# Patient Record
Sex: Male | Born: 1990 | Race: Black or African American | Hispanic: No | Marital: Single | Smoking: Never smoker
Health system: Southern US, Community
[De-identification: ages and names within clinical notes are randomized; demographics above are authoritative.]

## PROBLEM LIST (undated history)

## (undated) DIAGNOSIS — M419 Scoliosis, unspecified: Secondary | ICD-10-CM

## (undated) DIAGNOSIS — D5700 Hb-SS disease with crisis, unspecified: Secondary | ICD-10-CM

## (undated) HISTORY — PX: HIP PINNING: SHX1757

## (undated) HISTORY — PX: BACK SURGERY: SHX140

---
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
## 2009-11-29 ENCOUNTER — Emergency Department (HOSPITAL_COMMUNITY): Admission: EM | Admit: 2009-11-29 | Discharge: 2009-11-29 | Payer: Self-pay | Admitting: Emergency Medicine

## 2009-11-29 ENCOUNTER — Encounter (INDEPENDENT_AMBULATORY_CARE_PROVIDER_SITE_OTHER): Payer: Self-pay | Admitting: Emergency Medicine

## 2009-11-29 ENCOUNTER — Ambulatory Visit: Payer: Self-pay | Admitting: Surgery

## 2010-08-23 LAB — POCT I-STAT, CHEM 8
BUN: 8 mg/dL (ref 6–23)
Chloride: 104 mEq/L (ref 96–112)
Creatinine, Ser: 1 mg/dL (ref 0.4–1.5)
Hemoglobin: 11.2 g/dL — ABNORMAL LOW (ref 13.0–17.0)
Potassium: 4 mEq/L (ref 3.5–5.1)
Sodium: 139 mEq/L (ref 135–145)

## 2010-08-23 LAB — CBC
HCT: 31.2 % — ABNORMAL LOW (ref 39.0–52.0)
Hemoglobin: 10.5 g/dL — ABNORMAL LOW (ref 13.0–17.0)
MCH: 36.9 pg — ABNORMAL HIGH (ref 26.0–34.0)
MCHC: 33.6 g/dL (ref 30.0–36.0)
MCV: 109.8 fL — ABNORMAL HIGH (ref 78.0–100.0)
RBC: 2.84 MIL/uL — ABNORMAL LOW (ref 4.22–5.81)

## 2010-08-23 LAB — DIFFERENTIAL
Basophils Relative: 1 % (ref 0–1)
Eosinophils Relative: 1 % (ref 0–5)
Lymphs Abs: 2.8 10*3/uL (ref 0.7–4.0)
Monocytes Relative: 18 % — ABNORMAL HIGH (ref 3–12)
Neutro Abs: 8.1 10*3/uL — ABNORMAL HIGH (ref 1.7–7.7)

## 2010-08-23 LAB — RETICULOCYTES: Retic Ct Pct: 7.4 % — ABNORMAL HIGH (ref 0.4–3.1)

## 2010-10-27 ENCOUNTER — Inpatient Hospital Stay (HOSPITAL_COMMUNITY)
Admission: EM | Admit: 2010-10-27 | Discharge: 2010-11-03 | DRG: 574 | Disposition: A | Discharge status: Home / Self Care | Payer: Federal, State, Local not specified - PPO | Attending: Internal Medicine | Admitting: Internal Medicine

## 2010-10-27 ENCOUNTER — Emergency Department (HOSPITAL_COMMUNITY): Payer: Federal, State, Local not specified - PPO

## 2010-10-27 ENCOUNTER — Encounter (HOSPITAL_COMMUNITY): Payer: Self-pay

## 2010-10-27 DIAGNOSIS — R011 Cardiac murmur, unspecified: Secondary | ICD-10-CM | POA: Diagnosis present

## 2010-10-27 DIAGNOSIS — R040 Epistaxis: Secondary | ICD-10-CM | POA: Diagnosis not present

## 2010-10-27 DIAGNOSIS — R7402 Elevation of levels of lactic acid dehydrogenase (LDH): Secondary | ICD-10-CM | POA: Diagnosis present

## 2010-10-27 DIAGNOSIS — R7401 Elevation of levels of liver transaminase levels: Secondary | ICD-10-CM | POA: Diagnosis present

## 2010-10-27 DIAGNOSIS — D5700 Hb-SS disease with crisis, unspecified: Principal | ICD-10-CM | POA: Diagnosis present

## 2010-10-27 DIAGNOSIS — R0902 Hypoxemia: Secondary | ICD-10-CM | POA: Diagnosis present

## 2010-10-27 DIAGNOSIS — J189 Pneumonia, unspecified organism: Secondary | ICD-10-CM | POA: Diagnosis not present

## 2010-10-27 DIAGNOSIS — E871 Hypo-osmolality and hyponatremia: Secondary | ICD-10-CM | POA: Diagnosis present

## 2010-10-27 DIAGNOSIS — E876 Hypokalemia: Secondary | ICD-10-CM | POA: Diagnosis not present

## 2010-10-27 HISTORY — DX: Scoliosis, unspecified: M41.9

## 2010-10-27 HISTORY — DX: Hb-SS disease with crisis, unspecified: D57.00

## 2010-10-27 LAB — DIFFERENTIAL
Eosinophils Absolute: 0.1 10*3/uL (ref 0.0–0.7)
Eosinophils Relative: 1 % (ref 0–5)
Lymphocytes Relative: 46 % (ref 12–46)
Myelocytes: 0 %
Neutro Abs: 6.2 10*3/uL (ref 1.7–7.7)
Neutrophils Relative %: 44 % (ref 43–77)
Promyelocytes Absolute: 0 %
nRBC: 0 /100 WBC

## 2010-10-27 LAB — COMPREHENSIVE METABOLIC PANEL
ALT: 10 U/L (ref 0–53)
AST: 30 U/L (ref 0–37)
CO2: 27 mEq/L (ref 19–32)
Chloride: 104 mEq/L (ref 96–112)
GFR calc Af Amer: >60 mL/min (ref >60)
GFR calc non Af Amer: >60 mL/min (ref >60)
Sodium: 140 mEq/L (ref 135–145)
Total Bilirubin: 1.8 mg/dL — ABNORMAL HIGH (ref 0.3–1.2)

## 2010-10-27 LAB — CBC
HCT: 28.1 % — ABNORMAL LOW (ref 39.0–52.0)
Hemoglobin: 9.9 g/dL — ABNORMAL LOW (ref 13.0–17.0)
MCH: 33.8 pg (ref 26.0–34.0)
MCHC: 35.2 g/dL (ref 30.0–36.0)

## 2010-10-27 LAB — URINALYSIS, ROUTINE W REFLEX MICROSCOPIC
Glucose, UA: NEGATIVE mg/dL
Hgb urine dipstick: NEGATIVE
Specific Gravity, Urine: 1.012 (ref 1.005–1.030)

## 2010-10-27 NOTE — H&P (Signed)
Manuel Mcguire, AMBLE NO.:  [id number]  MEDICAL RECORD NO.:  [id number]           PATIENT TYPE:  E  LOCATION:  WLED                         FACILITY:  [facility]  PHYSICIAN:  Talmage Nap, MD  DATE OF BIRTH:  [birth year]  DATE OF ADMISSION:  10/27/2010 DATE OF DISCHARGE:                             HISTORY & PHYSICAL   PRIMARY CARE PHYSICIAN:  Jackie Plum, M.D.  History obtainable from the patient and the patient's parents.  CHIEF COMPLAINT:  Aches and pains of one day's duration.  The patient is a 20 year old African-American male with history of sickle cell disease, has had admissions in the past for bone pain crisis, was said to have been in stable health until late last night when the patient developed pain at the back and in the major joints. Pain was said to be very achy, initially started from the back and then thereafter spread all over the body to involve the major joints, especially of the shoulder, the knee, and the hip.  Pain was said to be excruciating with associated difficulty in ambulation.  There was however no history of fever.  There was no history of chills.  There was no history of rigor.  The patient also denied any history of chest pain or shortness of breath.  He complained about pain in his lower back with difficulty in voiding urine.  He denied any dysuria or hematuria.  After evaluating the patient, it was decided the patient should be admitted for stabilization.  PAST MEDICAL HISTORY: 1. Positive for sickle cell anemia with admissions for bone pain     crisis. 2. Scoliosis.  PAST SURGICAL HISTORY:  Lower back surgery.  PREADMISSION MEDICATIONS: 1. Hydrocodone/APAP 5/500 one to two tablets p.o. q.4 p.r.n. 2. Folic acid 1 mg p.o. daily. 3. Hydroxyurea 500 mg one tablet t.i.d.  ALLERGIES:  No known allergy.  SOCIAL HISTORY:  Negative for alcohol or tobacco use.  He is a Archivist at [employer].  FAMILY HISTORY:   Positive for sickle cell trait and hypertension.  REVIEW OF SYSTEMS:  The patient denies any history of headaches.  No blurry vision.  No nausea or vomiting.  Denies any fever.  No chills. No rigor.  No cough.  Complained about pains and aches in the major joints, especially of the shoulder, hip, and knee and lower back and also complained about difficulty micturating.  Denies any frank abdominal discomfort.  No dysuria or hematuria.  No swelling of the lower extremities.  No intolerance to heat or cold, and no neuropsychiatric disorder.  PHYSICAL EXAMINATION:  GENERAL:  A young man, dehydrated, in painful distress. VITAL SIGNS:  His present vital signs; blood pressure is 125/73, pulse is 83, respiratory rate is 16, temperature is 98.2. HEENT:  Pallor, icteric. NECK:  No jugular venous distention.  No carotid bruit.  No lymphadenopathy. CHEST:  Clear to auscultation. HEART:  Heart sounds S1 and S2 with soft systolic murmur. ABDOMEN:  Soft, nontender.  Liver, spleen tip not palpable.  Bowel sounds are positive. EXTREMITIES:  No pedal edema. NEUROLOGIC EXAM:  Did not show any lateralizing signs. NEUROPSYCHIATRIC:  Evaluation  is unremarkable. MUSCULOSKELETAL SYSTEM:  Tenderness in the lower back and in the major joints, especially of the shoulder and hip joints. SKIN:  Showed decreased turgor.  LABORATORY DATA:  Lipase 24, normal.  Chemistry showed sodium of 140, potassium of 3.9, chloride of 104 with a bicarb of 27, glucose is 85, BUN is 6, creatinine 0.83.  LFTs showed total bilirubin 1.8. Transaminases normal.  Urinalysis unremarkable.  Complete blood count with differential showed WBC of 14.1, hemoglobin of 9.9, hematocrit of 28.1, MCV of 95.9 with a platelet count of 340.  Imaging studies done include CT of the abdomen and pelvis without contrast showed trace free fluid in the pelvis.  There is atrophic spleen with diffusely increased attenuation.  IMPRESSION: 1. Sickle  cell disease and bone pain crisis. 2. Leukocytosis, etiology is unknown. 3. Systolic murmur, questionable valvular heart disease.  PLAN:  Plan is to admit the patient to general medical floor.  The patient will be adequately rehydrated with D5 half-normal saline to go at a rate of 150 cc an hour.  Pain control will be done presently in the ED with Dilaudid 4 mg IV q.4 p.r.n. and subsequently when he goes to the floor, the patient will have PCA pump as per protocol.  Other medications to be given to him will include: 1. Folic acid 1 mg p.o. daily. 2. Hydroxyurea 500 mg p.o. t.i.d. 3. He will be on oxygen via nasal cannula 3 breaths per minute. 4. GI prophylaxis with Protonix 40 mg IV q.24 and Lovenox 40 mg subcu     q.24.  Patient  empirically will be treated for his leukocytosis with Rocephin 1 g IV q.24 and he will have bladder catheterization to monitor strict input and output.  Further labs to be ordered on this patient will include urine culture, blood culture x2 before starting IV antibiotics.  CBC, CMP, and magnesium will be repeated in a.m. and the patient will have a 2-D echo done.Patient wiil be evaluated on a day-to-day basis.     Talmage Nap, MD     CN/MEDQ  D:  10/27/2010  T:  10/27/2010  Job:  [id number]  Electronically Signed by Talmage Nap  on 10/27/2010 12:54:04 PM

## 2010-10-28 ENCOUNTER — Inpatient Hospital Stay (HOSPITAL_COMMUNITY): Payer: Federal, State, Local not specified - PPO

## 2010-10-28 LAB — COMPREHENSIVE METABOLIC PANEL
ALT: 65 U/L — ABNORMAL HIGH (ref 0–53)
AST: 126 U/L — ABNORMAL HIGH (ref 0–37)
Albumin: 3.6 g/dL (ref 3.5–5.2)
Calcium: 8.4 mg/dL (ref 8.4–10.5)
Chloride: 99 mEq/L (ref 96–112)
Creatinine, Ser: 0.69 mg/dL (ref 0.4–1.5)
GFR calc Af Amer: >60 mL/min (ref >60)
Sodium: 132 mEq/L — ABNORMAL LOW (ref 135–145)

## 2010-10-28 LAB — DIFFERENTIAL
Basophils Relative: 1 % (ref 0–1)
Eosinophils Relative: 0 % (ref 0–5)
Lymphs Abs: 2.3 10*3/uL (ref 0.7–4.0)
Monocytes Absolute: 2.1 10*3/uL — ABNORMAL HIGH (ref 0.1–1.0)
Neutrophils Relative %: 76 % (ref 43–77)

## 2010-10-28 LAB — CBC
HCT: 24.6 % — ABNORMAL LOW (ref 39.0–52.0)
MCH: 34 pg (ref 26.0–34.0)
MCHC: 35.8 g/dL (ref 30.0–36.0)
MCV: 95 fL (ref 78.0–100.0)
Platelets: 190 10*3/uL (ref 150–400)
WBC: 18.8 10*3/uL — ABNORMAL HIGH (ref 4.0–10.5)

## 2010-10-28 LAB — ABO/RH: ABO/RH(D): A POS

## 2010-10-28 LAB — URINE CULTURE: Colony Count: NO GROWTH

## 2010-10-29 LAB — CBC
MCH: 33.9 pg (ref 26.0–34.0)
MCV: 91.6 fL (ref 78.0–100.0)
Platelets: 122 10*3/uL — ABNORMAL LOW (ref 150–400)
RDW: 21.1 % — ABNORMAL HIGH (ref 11.5–15.5)

## 2010-10-29 LAB — DIFFERENTIAL
Basophils Relative: 0 % (ref 0–1)
Eosinophils Relative: 0 % (ref 0–5)
Lymphs Abs: 2.7 10*3/uL (ref 0.7–4.0)
Monocytes Absolute: 1.9 10*3/uL — ABNORMAL HIGH (ref 0.1–1.0)

## 2010-10-29 LAB — COMPREHENSIVE METABOLIC PANEL
ALT: 55 U/L — ABNORMAL HIGH (ref 0–53)
AST: 89 U/L — ABNORMAL HIGH (ref 0–37)
CO2: 27 mEq/L (ref 19–32)
Chloride: 101 mEq/L (ref 96–112)
Creatinine, Ser: 0.83 mg/dL (ref 0.4–1.5)
GFR calc Af Amer: >60 mL/min (ref >60)
GFR calc non Af Amer: >60 mL/min (ref >60)
Total Bilirubin: 3.8 mg/dL — ABNORMAL HIGH (ref 0.3–1.2)

## 2010-10-29 LAB — RETICULOCYTES
RBC.: 2.27 MIL/uL — ABNORMAL LOW (ref 4.22–5.81)
Retic Count, Absolute: 395 10*3/uL — ABNORMAL HIGH (ref 19.0–186.0)
Retic Ct Pct: 17.4 % — ABNORMAL HIGH (ref 0.4–3.1)

## 2010-10-30 ENCOUNTER — Inpatient Hospital Stay (HOSPITAL_COMMUNITY): Payer: Federal, State, Local not specified - PPO

## 2010-10-30 LAB — DIFFERENTIAL
Basophils Relative: 0 % (ref 0–1)
Eosinophils Absolute: 0.2 10*3/uL (ref 0.0–0.7)
Lymphocytes Relative: 13 % (ref 12–46)
Monocytes Relative: 10 % (ref 3–12)
Neutro Abs: 13.4 10*3/uL — ABNORMAL HIGH (ref 1.7–7.7)
Neutrophils Relative %: 76 % (ref 43–77)

## 2010-10-30 LAB — BASIC METABOLIC PANEL
CO2: 27 mEq/L (ref 19–32)
Calcium: 7.8 mg/dL — ABNORMAL LOW (ref 8.4–10.5)
Chloride: 103 mEq/L (ref 96–112)
GFR calc Af Amer: >60 mL/min (ref >60)
Glucose, Bld: 108 mg/dL — ABNORMAL HIGH (ref 70–99)
Potassium: 3.3 mEq/L — ABNORMAL LOW (ref 3.5–5.1)
Sodium: 135 mEq/L (ref 135–145)

## 2010-10-30 LAB — CBC
Hemoglobin: 7.8 g/dL — ABNORMAL LOW (ref 13.0–17.0)
Hemoglobin: 7.5 g/dL — ABNORMAL LOW (ref 13.0–17.0)
MCH: 32.9 pg (ref 26.0–34.0)
MCHC: 37.7 g/dL — ABNORMAL HIGH (ref 30.0–36.0)
MCV: 87.3 fL (ref 78.0–100.0)
Platelets: 129 10*3/uL — ABNORMAL LOW (ref 150–400)
RBC: 2.37 MIL/uL — ABNORMAL LOW (ref 4.22–5.81)
RDW: 20.5 % — ABNORMAL HIGH (ref 11.5–15.5)
WBC: 17.7 10*3/uL — ABNORMAL HIGH (ref 4.0–10.5)
WBC: 20.1 10*3/uL — ABNORMAL HIGH (ref 4.0–10.5)

## 2010-10-30 LAB — PREPARE RBC (CROSSMATCH)

## 2010-10-30 LAB — PROTIME-INR: INR: 1.43 (ref 0.00–1.49)

## 2010-10-30 LAB — HEPATITIS PANEL, ACUTE: Hep B C IgM: NEGATIVE

## 2010-10-30 LAB — APTT: aPTT: 40 seconds — ABNORMAL HIGH (ref 24–37)

## 2010-10-31 LAB — COMPREHENSIVE METABOLIC PANEL
ALT: 31 U/L (ref 0–53)
AST: 45 U/L — ABNORMAL HIGH (ref 0–37)
Albumin: 2.7 g/dL — ABNORMAL LOW (ref 3.5–5.2)
Alkaline Phosphatase: 87 U/L (ref 39–117)
CO2: 26 mEq/L (ref 19–32)
Chloride: 102 mEq/L (ref 96–112)
Creatinine, Ser: 0.58 mg/dL (ref 0.4–1.5)
GFR calc Af Amer: >60 mL/min (ref >60)
Potassium: 4 mEq/L (ref 3.5–5.1)
Sodium: 136 mEq/L (ref 135–145)
Total Bilirubin: 2.7 mg/dL — ABNORMAL HIGH (ref 0.3–1.2)

## 2010-10-31 LAB — CBC
Hemoglobin: 9.3 g/dL — ABNORMAL LOW (ref 13.0–17.0)
RBC: 2.9 MIL/uL — ABNORMAL LOW (ref 4.22–5.81)
WBC: 14.3 10*3/uL — ABNORMAL HIGH (ref 4.0–10.5)

## 2010-10-31 LAB — DIFFERENTIAL
Basophils Absolute: 0 10*3/uL (ref 0.0–0.1)
Eosinophils Absolute: 0.1 10*3/uL (ref 0.0–0.7)
Lymphocytes Relative: 22 % (ref 12–46)
Monocytes Absolute: 1.3 10*3/uL — ABNORMAL HIGH (ref 0.1–1.0)
Neutrophils Relative %: 68 % (ref 43–77)

## 2010-11-01 LAB — TYPE AND SCREEN: Unit division: 0

## 2010-11-01 LAB — COMPREHENSIVE METABOLIC PANEL
Albumin: 2.7 g/dL — ABNORMAL LOW (ref 3.5–5.2)
BUN: 7 mg/dL (ref 6–23)
Calcium: 8.5 mg/dL (ref 8.4–10.5)
Chloride: 101 mEq/L (ref 96–112)
Creatinine, Ser: 0.59 mg/dL (ref 0.4–1.5)
GFR calc non Af Amer: >60 mL/min (ref >60)
Total Bilirubin: 2.3 mg/dL — ABNORMAL HIGH (ref 0.3–1.2)

## 2010-11-01 LAB — CBC
Hemoglobin: 8.8 g/dL — ABNORMAL LOW (ref 13.0–17.0)
MCH: 31.9 pg (ref 26.0–34.0)
MCHC: 36.4 g/dL — ABNORMAL HIGH (ref 30.0–36.0)
RDW: 20.4 % — ABNORMAL HIGH (ref 11.5–15.5)

## 2010-11-01 LAB — VANCOMYCIN, TROUGH: Vancomycin Tr: 12.8 ug/mL (ref 10.0–20.0)

## 2010-11-02 ENCOUNTER — Encounter (HOSPITAL_COMMUNITY): Payer: Self-pay | Admitting: Radiology

## 2010-11-02 ENCOUNTER — Inpatient Hospital Stay (HOSPITAL_COMMUNITY): Payer: Federal, State, Local not specified - PPO

## 2010-11-02 LAB — CBC
Hemoglobin: 8 g/dL — ABNORMAL LOW (ref 13.0–17.0)
Hemoglobin: 8.4 g/dL — ABNORMAL LOW (ref 13.0–17.0)
MCH: 31.9 pg (ref 26.0–34.0)
MCH: 31.6 pg (ref 26.0–34.0)
MCV: 89.1 fL (ref 78.0–100.0)
RBC: 2.51 MIL/uL — ABNORMAL LOW (ref 4.22–5.81)
RBC: 2.66 MIL/uL — ABNORMAL LOW (ref 4.22–5.81)

## 2010-11-02 LAB — CULTURE, BLOOD (ROUTINE X 2)
Culture  Setup Time: 201205222338
Culture: NO GROWTH

## 2010-11-02 LAB — COMPREHENSIVE METABOLIC PANEL
AST: 22 U/L (ref 0–37)
BUN: 9 mg/dL (ref 6–23)
CO2: 25 mEq/L (ref 19–32)
Chloride: 98 mEq/L (ref 96–112)
Creatinine, Ser: 0.69 mg/dL (ref 0.4–1.5)
GFR calc non Af Amer: >60 mL/min (ref >60)
Total Bilirubin: 1.4 mg/dL — ABNORMAL HIGH (ref 0.3–1.2)

## 2010-11-02 LAB — BASIC METABOLIC PANEL
CO2: 26 mEq/L (ref 19–32)
Chloride: 104 mEq/L (ref 96–112)
GFR calc Af Amer: >60 mL/min (ref >60)
Sodium: 137 mEq/L (ref 135–145)

## 2010-11-02 MED ORDER — IOHEXOL 300 MG/ML  SOLN
80.0000 mL | Freq: Once | INTRAMUSCULAR | Status: AC | PRN
  Administered 2010-11-02: 80 mL via INTRAVENOUS

## 2010-11-03 LAB — CBC
Hemoglobin: 8.3 g/dL — ABNORMAL LOW (ref 13.0–17.0)
MCHC: 34.9 g/dL (ref 30.0–36.0)
RBC: 2.67 MIL/uL — ABNORMAL LOW (ref 4.22–5.81)

## 2010-11-03 LAB — COMPREHENSIVE METABOLIC PANEL
AST: 21 U/L (ref 0–37)
Albumin: 3 g/dL — ABNORMAL LOW (ref 3.5–5.2)
Alkaline Phosphatase: 77 U/L (ref 39–117)
Chloride: 101 mEq/L (ref 96–112)
GFR calc Af Amer: >60 mL/min (ref >60)
Potassium: 3.9 mEq/L (ref 3.5–5.1)
Total Bilirubin: 1.5 mg/dL — ABNORMAL HIGH (ref 0.3–1.2)

## 2010-11-08 LAB — CULTURE, BLOOD (ROUTINE X 2)
Culture  Setup Time: 201205280955
Culture  Setup Time: 201205280955
Culture: NO GROWTH
Culture: NO GROWTH

## 2010-11-08 NOTE — Discharge Summary (Signed)
Manuel Mcguire, Manuel Mcguire             ACCOUNT NO.:  [id number]  MEDICAL RECORD NO.:  [id number]           PATIENT TYPE:  I  LOCATION:  1321                         FACILITY:  [facility]  PHYSICIAN:  Hartley Barefoot, MD    DATE OF BIRTH:  1991/05/28  DATE OF ADMISSION:  10/27/2010 DATE OF DISCHARGE:  11/03/2010                              DISCHARGE SUMMARY   PRIMARY CARE PHYSICIAN:  Jackie Plum, M.D.   DISCHARGE DIAGNOSES: 1. Sickle cell crisis. 2. Bilateral lower lobe pneumonia. 3. Transaminases likely secondary to sickle cell crisis. 4. Epistaxis, resolved. 5. Hyponatremia secondary to dehydration, resolved.  MEDICATIONS: 1. Avelox 400 mg p.o. daily. 2. Docusate 100 mg p.o. b.i.d. 3. Folic acid 1 tablet p.o. daily. 4. Hydrocodone Tylenol one to two tablets by mouth every 4 hours as     needed. 5. Hydroxyurea 500 mg p.o. 3 times a day.  STUDIES PERFORMED: 1. Chest x-ray showed bilateral lower lobe pneumonia, Oct 28, 2010.     Oct 31, 2010, stable bilateral lower lobe pneumonia. 2. CT chest showed bilateral lower lobe and lingular airspace     consolidation.  No evidence for pulmonary abscess, that was Nov 02, 2010.  BRIEF HISTORY OF PRESENT ILLNESS:  This is a very pleasant 20 year old African-American with past medical history significant for sickle cell. He presents complaining of back pain, joint pain.  His pain is generalized in both joints, especially shoulder, knees and hip.  He subsequently was complaining of cough.  He spiked fever during the hospitalization.  Chest x-ray showed pneumonia.  HOSPITAL COURSE: 1. Sickle cell crisis likely exacerbated by pneumonia.  The patient's     bilirubin was elevated at 3.8.  His retic count was elevated at 17.     He required 2 units of packed red blood cell during     hospitalization.  His hemoglobin has remained stable at 8.     His bilirubin decreased to 1.5.  His pain was controlled with IV Dilaudid.      He was  initially started on PCA pump, then this was transitioned to     IV Dilaudid p.r.n.  His pain is better.  We initially treated him with     IV fluids and blood transfusion.  We will discharge him on his p.o. pain medications.  2. Pneumonia.  The patient had a chest x-ray that showed pneumonia.     His white blood cell on admission was at 20.  He spiked fever over     5 days during hospitalization.  He was started on IV vanco and     Zosyn initially, then azithromycin was added because he continued     to be febrile.  His white blood cell decreased to 10.5 on the day     of discharge.  He completed 7 days of IV antibiotics.  His oxygen     saturation is improved, 100% on room air.  He has now remained     afebrile for 24 hours.  Blood cultures x2 negative.  CT was ordered     because the patient spiked fever to  rule out abscess and it was     negative. 3. Transaminases.  Increased liver function test, they decreased to     normal.  This was likely in the setting of sickle cell crisis. 4. Hypoxemia secondary to pneumonia and sickle cell disease.  The     patient denies chest pain. 5. Systolic murmur likely secondary to anemia.  The patient had a 2-D     echo that showed normal ejection fraction 65%, no valve abnormality     that will explain this murmur.  On the day of discharge, the patient was feeling at baseline, blood pressure 116/75, sats 100% on room air, respirations 20, pulse 79, temperature 98.0.  White blood cell at 10.5, hemoglobin 8.3, platelet 257.  Sodium 135, potassium 3.9, chloride 101, bicarb 24, glucose 86, creatinine 0.63, BUN 1.5, AST 21, AST 18, albumin 3.0.  The patient was discharged in stable condition.     Hartley Barefoot, MD     BR/MEDQ  D:  11/03/2010  T:  11/04/2010  Job:  [id number]  cc:   Jackie Plum, M.D. Fax: [phone]  Electronically Signed by Hartley Barefoot MD on 11/08/2010 09:33:47 PM

## 2012-03-12 ENCOUNTER — Emergency Department (HOSPITAL_COMMUNITY): Payer: No Typology Code available for payment source

## 2012-03-12 ENCOUNTER — Encounter (HOSPITAL_COMMUNITY): Payer: Self-pay | Admitting: *Deleted

## 2012-03-12 ENCOUNTER — Emergency Department (HOSPITAL_COMMUNITY)
Admission: EM | Admit: 2012-03-12 | Discharge: 2012-03-12 | Disposition: A | Discharge status: Home / Self Care | Payer: No Typology Code available for payment source | Attending: Emergency Medicine | Admitting: Emergency Medicine

## 2012-03-12 DIAGNOSIS — M542 Cervicalgia: Secondary | ICD-10-CM | POA: Insufficient documentation

## 2012-03-12 DIAGNOSIS — T07XXXA Unspecified multiple injuries, initial encounter: Secondary | ICD-10-CM | POA: Insufficient documentation

## 2012-03-12 DIAGNOSIS — F411 Generalized anxiety disorder: Secondary | ICD-10-CM | POA: Insufficient documentation

## 2012-03-12 DIAGNOSIS — S0219XA Other fracture of base of skull, initial encounter for closed fracture: Secondary | ICD-10-CM

## 2012-03-12 DIAGNOSIS — S02109A Fracture of base of skull, unspecified side, initial encounter for closed fracture: Secondary | ICD-10-CM | POA: Insufficient documentation

## 2012-03-12 DIAGNOSIS — H748X9 Other specified disorders of middle ear and mastoid, unspecified ear: Secondary | ICD-10-CM | POA: Insufficient documentation

## 2012-03-12 DIAGNOSIS — M25569 Pain in unspecified knee: Secondary | ICD-10-CM | POA: Insufficient documentation

## 2012-03-12 DIAGNOSIS — IMO0002 Reserved for concepts with insufficient information to code with codable children: Secondary | ICD-10-CM

## 2012-03-12 DIAGNOSIS — S0180XA Unspecified open wound of other part of head, initial encounter: Secondary | ICD-10-CM | POA: Insufficient documentation

## 2012-03-12 DIAGNOSIS — D571 Sickle-cell disease without crisis: Secondary | ICD-10-CM | POA: Insufficient documentation

## 2012-03-12 DIAGNOSIS — S02609A Fracture of mandible, unspecified, initial encounter for closed fracture: Secondary | ICD-10-CM | POA: Insufficient documentation

## 2012-03-12 LAB — BASIC METABOLIC PANEL
BUN: 10 mg/dL (ref 6–23)
Calcium: 9.6 mg/dL (ref 8.4–10.5)
Creatinine, Ser: 0.91 mg/dL (ref 0.50–1.35)
GFR calc Af Amer: >90 mL/min (ref >90)
GFR calc non Af Amer: >90 mL/min (ref >90)
Glucose, Bld: 99 mg/dL (ref 70–99)
Potassium: 3.9 mEq/L (ref 3.5–5.1)

## 2012-03-12 LAB — CBC WITH DIFFERENTIAL/PLATELET
Basophils Relative: 1 % (ref 0–1)
Eosinophils Absolute: 0.1 10*3/uL (ref 0.0–0.7)
Eosinophils Relative: 2 % (ref 0–5)
Hemoglobin: 10.3 g/dL — ABNORMAL LOW (ref 13.0–17.0)
MCH: 35.8 pg — ABNORMAL HIGH (ref 26.0–34.0)
MCHC: 36.8 g/dL — ABNORMAL HIGH (ref 30.0–36.0)
MCV: 97.2 fL (ref 78.0–100.0)
Monocytes Relative: 15 % — ABNORMAL HIGH (ref 3–12)
Neutrophils Relative %: 48 % (ref 43–77)

## 2012-03-12 LAB — RAPID URINE DRUG SCREEN, HOSP PERFORMED
Barbiturates: NOT DETECTED
Cocaine: NOT DETECTED
Tetrahydrocannabinol: POSITIVE — AB

## 2012-03-12 MED ORDER — CLINDAMYCIN HCL 150 MG PO CAPS: 300.0000 mg | ORAL_CAPSULE | Freq: Three times a day (TID) | ORAL | Status: DC

## 2012-03-12 MED ORDER — HYDROMORPHONE HCL PF 1 MG/ML IJ SOLN
1.0000 mg | Freq: Once | INTRAMUSCULAR | Status: AC
  Administered 2012-03-12: 1 mg via INTRAVENOUS
  Filled 2012-03-12: qty 1

## 2012-03-12 MED ORDER — OXYCODONE-ACETAMINOPHEN 5-325 MG PO TABS: 2.0000 | ORAL_TABLET | ORAL | Status: DC | PRN

## 2012-03-12 MED ORDER — ONDANSETRON HCL 4 MG/2ML IJ SOLN
4.0000 mg | Freq: Once | INTRAMUSCULAR | Status: AC
  Administered 2012-03-12: 4 mg via INTRAVENOUS
  Filled 2012-03-12: qty 2

## 2012-03-12 MED ORDER — SODIUM CHLORIDE 0.9 % IV BOLUS (SEPSIS)
1000.0000 mL | Freq: Once | INTRAVENOUS | Status: AC
  Administered 2012-03-12: 1000 mL via INTRAVENOUS

## 2012-03-12 MED ORDER — IOHEXOL 300 MG/ML  SOLN
100.0000 mL | Freq: Once | INTRAMUSCULAR | Status: AC | PRN
  Administered 2012-03-12: 100 mL via INTRAVENOUS
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
## 2012-03-12 NOTE — ED Notes (Signed)
Patient was able to get out of bed on him own and ambulate in the hall with no difficulty.  Patient states he has pain in his left knee and the pain is when at rest and walking.

## 2012-03-12 NOTE — ED Notes (Signed)
Pt taken off of head blocks, backboard. Pt not complaining of back pain. Pt complaining of neck pain. c-collar remains.

## 2012-03-12 NOTE — ED Notes (Signed)
Per Kassie(RN) MD waiving creatinine requirement on patient due to acute trauma

## 2012-03-12 NOTE — ED Notes (Signed)
Patient is currently in xray. Patient to be brought to room 29 once xray is complete. Patient will be waiting discharge. Marland Kitchen

## 2012-03-12 NOTE — ED Notes (Signed)
Report to Dennie Bible, RN in [place].  Pt to transfer to C-29 while waiting for knee x-ray.

## 2012-03-12 NOTE — ED Notes (Signed)
First meeting with patient. Patient states he is okay and needs a urinal. Patient given a urinal, apple juice and call light.
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
## 2012-03-12 NOTE — ED Notes (Signed)
Pt in CT.
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
## 2012-04-02 ENCOUNTER — Ambulatory Visit (HOSPITAL_BASED_OUTPATIENT_CLINIC_OR_DEPARTMENT_OTHER): Payer: No Typology Code available for payment source

## 2012-10-23 ENCOUNTER — Encounter: Payer: Self-pay | Admitting: Internal Medicine

## 2012-10-27 ENCOUNTER — Encounter: Payer: Self-pay | Admitting: Internal Medicine

## 2012-10-27 ENCOUNTER — Ambulatory Visit (HOSPITAL_COMMUNITY)
Admission: AD | Admit: 2012-10-27 | Discharge: 2012-10-27 | Disposition: A | Discharge status: Home / Self Care | Payer: Federal, State, Local not specified - PPO | Source: Ambulatory Visit | Attending: Internal Medicine | Admitting: Internal Medicine

## 2012-10-27 ENCOUNTER — Ambulatory Visit (INDEPENDENT_AMBULATORY_CARE_PROVIDER_SITE_OTHER): Payer: Federal, State, Local not specified - PPO | Admitting: Internal Medicine

## 2012-10-27 VITALS — BP 117/62 | HR 61 | Temp 98.1°F | Ht 67.0 in | Wt 151.0 lb

## 2012-10-27 DIAGNOSIS — D571 Sickle-cell disease without crisis: Secondary | ICD-10-CM

## 2012-10-27 DIAGNOSIS — D5700 Hb-SS disease with crisis, unspecified: Secondary | ICD-10-CM | POA: Insufficient documentation

## 2012-10-27 LAB — COMPREHENSIVE METABOLIC PANEL
Albumin: 4 g/dL (ref 3.5–5.2)
Alkaline Phosphatase: 73 U/L (ref 39–117)
BUN: 10 mg/dL (ref 6–23)
Creatinine, Ser: 0.84 mg/dL (ref 0.50–1.35)
GFR calc Af Amer: >90 mL/min (ref >90)
Glucose, Bld: 82 mg/dL (ref 70–99)
Potassium: 4.3 mEq/L (ref 3.5–5.1)
Total Protein: 8.2 g/dL (ref 6.0–8.3)

## 2012-10-27 LAB — CBC WITH DIFFERENTIAL/PLATELET
Basophils Relative: 0 % (ref 0–1)
Eosinophils Absolute: 0.3 10*3/uL (ref 0.0–0.7)
Eosinophils Relative: 3 % (ref 0–5)
HCT: 28 % — ABNORMAL LOW (ref 39.0–52.0)
Hemoglobin: 10.3 g/dL — ABNORMAL LOW (ref 13.0–17.0)
Lymphs Abs: 4 10*3/uL (ref 0.7–4.0)
MCH: 37.6 pg — ABNORMAL HIGH (ref 26.0–34.0)
MCHC: 36.8 g/dL — ABNORMAL HIGH (ref 30.0–36.0)
MCV: 102.2 fL — ABNORMAL HIGH (ref 78.0–100.0)
Monocytes Absolute: 1.6 10*3/uL — ABNORMAL HIGH (ref 0.1–1.0)
Monocytes Relative: 17 % — ABNORMAL HIGH (ref 3–12)
RBC: 2.74 MIL/uL — ABNORMAL LOW (ref 4.22–5.81)

## 2012-10-27 LAB — RETICULOCYTES
Retic Count, Absolute: 323.3 10*3/uL — ABNORMAL HIGH (ref 19.0–186.0)
Retic Ct Pct: 11.8 % — ABNORMAL HIGH (ref 0.4–3.1)

## 2012-10-27 LAB — FERRITIN: Ferritin: 241 ng/mL (ref 22–322)

## 2012-10-27 MED ORDER — HYDROXYUREA 500 MG PO CAPS: 1500.0000 mg | ORAL_CAPSULE | Freq: Every day | ORAL | Status: AC

## 2012-10-27 MED ORDER — OXYCODONE-ACETAMINOPHEN 5-325 MG PO TABS: 1.0000 | ORAL_TABLET | ORAL | Status: DC | PRN

## 2012-10-27 MED ORDER — FOLIC ACID 1 MG PO TABS: 1.0000 mg | ORAL_TABLET | Freq: Every day | ORAL | Status: AC

## 2012-10-27 NOTE — Progress Notes (Signed)
Pt with sickle cell disease here for lab draws. Labs drawn as per order, pt tolerated well

## 2012-10-27 NOTE — Patient Instructions (Signed)
Followup in the clinic in one month.

## 2012-10-27 NOTE — Progress Notes (Signed)
Subjective:     Patient ID: Manuel Mcguire, male   DOB: 10-01-1990, 22 y.o.   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 The patient states that his symptoms he usually well controlled and he goes for months sometimes without having any episodes of pain. He is on hydroxyurea and folic acid and doses that he is compliant with these medications. He states that his last eye examination was approximately 2 months ago and that his last echocardiogram was less than 5 years ago. He is unable to articulate the results of those examinations. The patient denies any shortness of breath, dyspnea on exertion, fever, chills, nausea, vomiting or diarrhea. He states his last hospitalization was greater than one year ago when he is admitted for vaso-occlusive episode. 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 During the summer he works [employer] and feels that his life is well adjusted in the context of his sickle cell disease   Review of Systems  Constitutional: Negative.   HENT: Negative.   Eyes: Negative.   Respiratory: Negative.   Cardiovascular: Negative.   Gastrointestinal: Negative.   Endocrine: Negative.   Genitourinary: Negative.   Musculoskeletal: Negative.   Skin: Negative.   Allergic/Immunologic: Negative.   Neurological: Negative.   Hematological: Negative.   Psychiatric/Behavioral: Negative.        Objective:   Physical Exam  Constitutional: He is oriented to person, place, and time. He appears well-developed and well-nourished.  HENT:  Head: Normocephalic and atraumatic.  Eyes: Conjunctivae and EOM are normal. Pupils are equal, round, and reactive to light. No scleral icterus.  Neck: Normal range of motion. Neck supple. No JVD present. No thyromegaly present.  Cardiovascular: Normal rate, regular rhythm  and normal heart sounds.   Pulmonary/Chest: Effort normal and breath sounds normal. He has no wheezes. He has no rales. He exhibits no tenderness.  Abdominal: Soft. Bowel sounds are normal. He exhibits no distension and no mass. There is no tenderness.  Musculoskeletal: Normal range of motion.  Lymphadenopathy:    He has no cervical adenopathy.  Neurological: He is alert and oriented to person, place, and time. He has normal reflexes. No cranial nerve deficit.  Skin: Skin is warm and dry.  Psychiatric: He has a normal mood and affect. His behavior is normal. Judgment and thought content normal.       Assessment:     This is a patient who reports hemoglobin SS. It appears that he is doing very well in the context of this disease and that the disease had very little impact on disruption of his life.    Plan:     Pt will continue on current dose of hydroxyurea and folic acid. Patient has had no surveillance last was hydroxyurea since October 2013. Looking CBC with differential, comprehensive metabolic and, reticulocyte, ferritin, hemoglobin electrophoresis. Patient is up-to-date on immunizations, ophthalmologic examination and echocardiogram.  And is to return to clinic in one month for followup on labs and medication adjustments as necessary

## 2012-10-27 NOTE — Progress Notes (Signed)
Patient ID: Manuel Mcguire, male   DOB: 06-07-1991, 22 y.o.   MRN: [id number] Labs drawn per order, without difficulty.

## 2012-10-31 ENCOUNTER — Telehealth: Payer: Self-pay | Admitting: Internal Medicine

## 2012-11-24 ENCOUNTER — Ambulatory Visit: Payer: Federal, State, Local not specified - PPO | Admitting: Internal Medicine

## 2013-01-26 NOTE — Telephone Encounter (Signed)
CLOSED

## 2013-01-30 ENCOUNTER — Telehealth: Payer: Self-pay | Admitting: Internal Medicine

## 2013-01-30 DIAGNOSIS — D571 Sickle-cell disease without crisis: Secondary | ICD-10-CM

## 2013-06-22 ENCOUNTER — Telehealth: Payer: Self-pay | Admitting: Internal Medicine

## 2013-06-22 NOTE — Telephone Encounter (Signed)
Left message for patient to call to schedule annual physical.  °
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
## 2023-02-05 ENCOUNTER — Emergency Department (HOSPITAL_COMMUNITY): Payer: Self-pay

## 2023-02-05 ENCOUNTER — Other Ambulatory Visit: Payer: Self-pay

## 2023-02-05 ENCOUNTER — Inpatient Hospital Stay (HOSPITAL_COMMUNITY)
Admission: EM | Admit: 2023-02-05 | Discharge: 2023-02-09 | DRG: 811 | Disposition: A | Discharge status: Home / Self Care | Payer: Self-pay | Attending: Internal Medicine | Admitting: Internal Medicine

## 2023-02-05 DIAGNOSIS — D638 Anemia in other chronic diseases classified elsewhere: Secondary | ICD-10-CM | POA: Diagnosis present

## 2023-02-05 DIAGNOSIS — J9601 Acute respiratory failure with hypoxia: Secondary | ICD-10-CM

## 2023-02-05 DIAGNOSIS — D5700 Hb-SS disease with crisis, unspecified: Principal | ICD-10-CM | POA: Diagnosis present

## 2023-02-05 DIAGNOSIS — Z1152 Encounter for screening for COVID-19: Secondary | ICD-10-CM

## 2023-02-05 DIAGNOSIS — M419 Scoliosis, unspecified: Secondary | ICD-10-CM | POA: Diagnosis present

## 2023-02-05 DIAGNOSIS — Z79899 Other long term (current) drug therapy: Secondary | ICD-10-CM

## 2023-02-05 DIAGNOSIS — Z8249 Family history of ischemic heart disease and other diseases of the circulatory system: Secondary | ICD-10-CM

## 2023-02-05 DIAGNOSIS — Z832 Family history of diseases of the blood and blood-forming organs and certain disorders involving the immune mechanism: Secondary | ICD-10-CM

## 2023-02-05 DIAGNOSIS — J189 Pneumonia, unspecified organism: Secondary | ICD-10-CM

## 2023-02-05 LAB — RETICULOCYTES
Immature Retic Fract: 34.9 % — ABNORMAL HIGH (ref 2.3–15.9)
RBC.: 2.11 MIL/uL — ABNORMAL LOW (ref 4.22–5.81)
Retic Count, Absolute: 216.5 10*3/uL — ABNORMAL HIGH (ref 19.0–186.0)
Retic Ct Pct: 8.3 % — ABNORMAL HIGH (ref 0.4–3.1)

## 2023-02-05 LAB — TROPONIN I (HIGH SENSITIVITY)
Troponin I (High Sensitivity): 8 ng/L (ref <18)
Troponin I (High Sensitivity): 9 ng/L (ref <18)

## 2023-02-05 LAB — CBC WITH DIFFERENTIAL/PLATELET
Abs Immature Granulocytes: 0.08 10*3/uL — ABNORMAL HIGH (ref 0.00–0.07)
Basophils Absolute: 0.1 10*3/uL (ref 0.0–0.1)
Basophils Relative: 0 %
Eosinophils Absolute: 0.4 10*3/uL (ref 0.0–0.5)
Eosinophils Relative: 3 %
HCT: 18.4 % — ABNORMAL LOW (ref 39.0–52.0)
Hemoglobin: 6.9 g/dL — CL (ref 13.0–17.0)
Immature Granulocytes: 1 %
Lymphocytes Relative: 32 %
Lymphs Abs: 5.1 10*3/uL — ABNORMAL HIGH (ref 0.7–4.0)
MCH: 33.3 pg (ref 26.0–34.0)
MCHC: 37.5 g/dL — ABNORMAL HIGH (ref 30.0–36.0)
MCV: 88.9 fL (ref 80.0–100.0)
Monocytes Absolute: 2 10*3/uL — ABNORMAL HIGH (ref 0.1–1.0)
Monocytes Relative: 13 %
Neutro Abs: 8.3 10*3/uL — ABNORMAL HIGH (ref 1.7–7.7)
Neutrophils Relative %: 51 %
Platelets: 310 10*3/uL (ref 150–400)
RBC: 2.07 MIL/uL — ABNORMAL LOW (ref 4.22–5.81)
RDW: 25.4 % — ABNORMAL HIGH (ref 11.5–15.5)
WBC: 15.9 10*3/uL — ABNORMAL HIGH (ref 4.0–10.5)
nRBC: 3.8 % — ABNORMAL HIGH (ref 0.0–0.2)

## 2023-02-05 LAB — COMPREHENSIVE METABOLIC PANEL
ALT: 52 U/L — ABNORMAL HIGH (ref 0–44)
AST: 74 U/L — ABNORMAL HIGH (ref 15–41)
Albumin: 4 g/dL (ref 3.5–5.0)
Alkaline Phosphatase: 110 U/L (ref 38–126)
Anion gap: 6 (ref 5–15)
BUN: 13 mg/dL (ref 6–20)
CO2: 22 mmol/L (ref 22–32)
Calcium: 8.8 mg/dL — ABNORMAL LOW (ref 8.9–10.3)
Chloride: 106 mmol/L (ref 98–111)
Creatinine, Ser: 0.84 mg/dL (ref 0.61–1.24)
GFR, Estimated: >60 mL/min (ref >60)
Glucose, Bld: 87 mg/dL (ref 70–99)
Potassium: 4 mmol/L (ref 3.5–5.1)
Sodium: 134 mmol/L — ABNORMAL LOW (ref 135–145)
Total Bilirubin: 2.3 mg/dL — ABNORMAL HIGH (ref 0.3–1.2)
Total Protein: 8.3 g/dL — ABNORMAL HIGH (ref 6.5–8.1)

## 2023-02-05 LAB — RESP PANEL BY RT-PCR (RSV, FLU A&B, COVID)  RVPGX2
Influenza A by PCR: NEGATIVE
Influenza B by PCR: NEGATIVE
Resp Syncytial Virus by PCR: NEGATIVE
SARS Coronavirus 2 by RT PCR: NEGATIVE

## 2023-02-05 LAB — PROCALCITONIN: Procalcitonin: 0.15 ng/mL

## 2023-02-05 MED ORDER — DIPHENHYDRAMINE HCL 25 MG PO CAPS
25.0000 mg | ORAL_CAPSULE | ORAL | Status: DC | PRN
  Administered 2023-02-06: 25 mg via ORAL
  Filled 2023-02-05: qty 1

## 2023-02-05 MED ORDER — HYDROMORPHONE HCL 2 MG/ML IJ SOLN
2.0000 mg | Freq: Once | INTRAMUSCULAR | Status: AC
  Administered 2023-02-05: 2 mg via INTRAVENOUS
  Filled 2023-02-05: qty 1

## 2023-02-05 MED ORDER — SODIUM CHLORIDE 0.9 % IV SOLN
2.0000 g | INTRAVENOUS | Status: DC
  Administered 2023-02-06: 2 g via INTRAVENOUS
  Filled 2023-02-05: qty 20

## 2023-02-05 MED ORDER — HYDROMORPHONE HCL 1 MG/ML IJ SOLN
1.0000 mg | Freq: Once | INTRAMUSCULAR | Status: AC
  Administered 2023-02-05: 1 mg via INTRAVENOUS
  Filled 2023-02-05: qty 1

## 2023-02-05 MED ORDER — KETOROLAC TROMETHAMINE 15 MG/ML IJ SOLN
15.0000 mg | Freq: Four times a day (QID) | INTRAMUSCULAR | Status: DC
  Administered 2023-02-06 – 2023-02-09 (×14): 15 mg via INTRAVENOUS
  Filled 2023-02-05 (×14): qty 1

## 2023-02-05 MED ORDER — SODIUM CHLORIDE 0.9 % IV SOLN
1.0000 g | Freq: Once | INTRAVENOUS | Status: AC
  Administered 2023-02-05: 1 g via INTRAVENOUS
  Filled 2023-02-05: qty 10

## 2023-02-05 MED ORDER — POLYETHYLENE GLYCOL 3350 17 G PO PACK: 17.0000 g | PACK | Freq: Every day | ORAL | Status: DC | PRN

## 2023-02-05 MED ORDER — SODIUM CHLORIDE 0.45 % IV SOLN: INTRAVENOUS | Status: DC

## 2023-02-05 MED ORDER — SODIUM CHLORIDE 0.9% FLUSH: 9.0000 mL | INTRAVENOUS | Status: DC | PRN

## 2023-02-05 MED ORDER — HYDROMORPHONE HCL 2 MG/ML IJ SOLN
2.0000 mg | INTRAMUSCULAR | Status: AC
  Administered 2023-02-05: 2 mg via INTRAVENOUS
  Filled 2023-02-05: qty 1

## 2023-02-05 MED ORDER — ONDANSETRON HCL 4 MG/2ML IJ SOLN: 4.0000 mg | INTRAMUSCULAR | Status: DC | PRN

## 2023-02-05 MED ORDER — SODIUM CHLORIDE 0.9 % IV SOLN
500.0000 mg | INTRAVENOUS | Status: DC
  Administered 2023-02-05 – 2023-02-06 (×2): 500 mg via INTRAVENOUS
  Filled 2023-02-05 (×2): qty 5

## 2023-02-05 MED ORDER — FOLIC ACID 1 MG PO TABS
1.0000 mg | ORAL_TABLET | Freq: Every day | ORAL | Status: DC
  Administered 2023-02-05 – 2023-02-09 (×5): 1 mg via ORAL
  Filled 2023-02-05 (×5): qty 1

## 2023-02-05 MED ORDER — SODIUM CHLORIDE 0.9 % IV SOLN: 500.0000 mg | Freq: Once | INTRAVENOUS | Status: DC

## 2023-02-05 MED ORDER — NALOXONE HCL 0.4 MG/ML IJ SOLN: 0.4000 mg | INTRAMUSCULAR | Status: DC | PRN

## 2023-02-05 MED ORDER — ENOXAPARIN SODIUM 40 MG/0.4ML IJ SOSY
40.0000 mg | PREFILLED_SYRINGE | INTRAMUSCULAR | Status: DC
  Administered 2023-02-06 – 2023-02-09 (×4): 40 mg via SUBCUTANEOUS
  Filled 2023-02-05 (×4): qty 0.4

## 2023-02-05 MED ORDER — HYDROXYUREA 500 MG PO CAPS
1500.0000 mg | ORAL_CAPSULE | Freq: Every day | ORAL | Status: DC
  Administered 2023-02-05 – 2023-02-08 (×4): 1500 mg via ORAL
  Filled 2023-02-05 (×4): qty 3

## 2023-02-05 MED ORDER — DEXTROSE-SODIUM CHLORIDE 5-0.45 % IV SOLN: INTRAVENOUS | Status: DC

## 2023-02-05 MED ORDER — HYDROMORPHONE HCL 1 MG/ML IJ SOLN: 1.0000 mg | Freq: Once | INTRAMUSCULAR | Status: DC

## 2023-02-05 MED ORDER — ONDANSETRON HCL 4 MG/2ML IJ SOLN: 4.0000 mg | Freq: Four times a day (QID) | INTRAMUSCULAR | Status: DC | PRN

## 2023-02-05 MED ORDER — KETOROLAC TROMETHAMINE 15 MG/ML IJ SOLN
15.0000 mg | INTRAMUSCULAR | Status: AC
  Administered 2023-02-05: 15 mg via INTRAVENOUS
  Filled 2023-02-05: qty 1

## 2023-02-05 MED ORDER — HYDROMORPHONE 1 MG/ML IV SOLN
INTRAVENOUS | Status: DC
  Administered 2023-02-06: 1 mg via INTRAVENOUS
  Administered 2023-02-06: 30 mg via INTRAVENOUS
  Administered 2023-02-06: 3 mg via INTRAVENOUS
  Administered 2023-02-06: 0.5 mg via INTRAVENOUS
  Administered 2023-02-06: 2 mg via INTRAVENOUS
  Administered 2023-02-07: 30 mg via INTRAVENOUS
  Administered 2023-02-07: 9.5 mg via INTRAVENOUS
  Filled 2023-02-05 (×2): qty 30

## 2023-02-05 MED ORDER — SENNOSIDES-DOCUSATE SODIUM 8.6-50 MG PO TABS
1.0000 | ORAL_TABLET | Freq: Two times a day (BID) | ORAL | Status: DC
  Administered 2023-02-06 – 2023-02-09 (×8): 1 via ORAL
  Filled 2023-02-05 (×8): qty 1

## 2023-02-05 MED ORDER — ONDANSETRON HCL 4 MG PO TABS: 4.0000 mg | ORAL_TABLET | ORAL | Status: DC | PRN

## 2023-02-05 MED ORDER — IOHEXOL 350 MG/ML SOLN
75.0000 mL | Freq: Once | INTRAVENOUS | Status: AC | PRN
  Administered 2023-02-05: 75 mL via INTRAVENOUS

## 2023-02-05 MED ORDER — HYDROXYZINE HCL 25 MG PO TABS
25.0000 mg | ORAL_TABLET | ORAL | Status: DC | PRN
  Administered 2023-02-06 – 2023-02-08 (×2): 25 mg via ORAL
  Administered 2023-02-09: 50 mg via ORAL
  Filled 2023-02-05 (×2): qty 1
  Filled 2023-02-05: qty 2

## 2023-02-05 NOTE — Assessment & Plan Note (Addendum)
-   will admit per sickle cell protocol,    control pain,    hydrate with IVF D5 .45% Saline @ 100 mls/hour,    Weight based Dilaudid PCA for opioid tolerant patients.    continue hydroxyurea and folic acid   Transfuse as needed if Hg drops significantly below baseline. He reports have had blood transfusion in the past back in May for HG of 6.9 but states he gets itchy and has chills with blood transfusion  Will obtain type and screen Monitor Hg may need 1 UNIt if HG cont to drop defer to sickle cell team in am     Sickle cell team to take over management in AM

## 2023-02-05 NOTE — ED Notes (Signed)
Went to administer Rocephin. Noted that blood cultures or lactic acid had not been drawn on prior shift and rocephin was running via gravity and had not infused but half the bag. Placed on pump to finish. Patient given urinal to void to collect urine specimen.

## 2023-02-05 NOTE — ED Notes (Signed)
ED TO INPATIENT HANDOFF REPORT  Name/Age/Gender Manuel Mcguire 32 y.o. male  Code Status    Code Status Orders  (From admission, onward)           Start     Ordered   02/05/23 2052  Full code  Continuous       Question:  By:  Answer:  Consent: discussion documented in EHR   02/05/23 2053           Code Status History     This patient has a current code status but no historical code status.       Home/SNF/Other Home  Chief Complaint Sickle cell crisis (HCC) [D57.00]  Level of Care/Admitting Diagnosis ED Disposition     ED Disposition  Admit   Condition  --   Comment  Hospital Area: [facility] [[id number]]  Level of Care: Telemetry [5]  Admit to tele based on following criteria: Other see comments  Comments: acute chest  May admit patient to [facility] or [facility] if equivalent level of care is available:: No  Covid Evaluation: Asymptomatic - no recent exposure (last 10 days) testing not required  Diagnosis: Sickle cell crisis [facility]) [[id number]]  Admitting Physician: Therisa Doyne [[id number]]  Attending Physician: Therisa Doyne [[id number]]  Certification:: I certify this patient will need inpatient services for at least 2 midnights  Expected Medical Readiness: 02/08/2023          Medical History Past Medical History:  Diagnosis Date   Scoliosis    Sickle cell anemia with pain (HCC)     Allergies No Known Allergies  IV Location/Drains/Wounds Patient Lines/Drains/Airways Status     Active Line/Drains/Airways     Name Placement date Placement time Site Days   Peripheral IV 02/05/23 20 G 1.88" Anterior;Left;Proximal Forearm 02/05/23  1543  Forearm  less than 1   Peripheral IV 02/05/23 20 G 1" Anterior;Right Forearm 02/05/23  2245  Forearm  less than 1            Labs/Imaging Results for orders placed or performed during the hospital encounter of 02/05/23 (from the past 48 hour(s))  Comprehensive metabolic panel      Status: Abnormal   Collection Time: 02/05/23  1:35 PM  Result Value Ref Range   Sodium 134 (L) 135 - 145 mmol/L   Potassium 4.0 3.5 - 5.1 mmol/L   Chloride 106 98 - 111 mmol/L   CO2 22 22 - 32 mmol/L   Glucose, Bld 87 70 - 99 mg/dL    Comment: Glucose reference range applies only to samples taken after fasting for at least 8 hours.   BUN 13 6 - 20 mg/dL   Creatinine, Ser 6.21 0.61 - 1.24 mg/dL   Calcium 8.8 (L) 8.9 - 10.3 mg/dL   Total Protein 8.3 (H) 6.5 - 8.1 g/dL   Albumin 4.0 3.5 - 5.0 g/dL   AST 74 (H) 15 - 41 U/L   ALT 52 (H) 0 - 44 U/L   Alkaline Phosphatase 110 38 - 126 U/L   Total Bilirubin 2.3 (H) 0.3 - 1.2 mg/dL   GFR, Estimated >30 >86 mL/min    Comment: (NOTE) Calculated using the CKD-EPI Creatinine Equation (2021)    Anion gap 6 5 - 15    Comment: Performed at [facility], [address] [place], [place] [address]  CBC with Differential     Status: Abnormal   Collection Time: 02/05/23  1:35 PM  Result Value Ref  Range   WBC 15.9 (H) 4.0 - 10.5 K/uL    Comment: WHITE COUNT CONFIRMED ON SMEAR   RBC 2.07 (L) 4.22 - 5.81 MIL/uL   Hemoglobin 6.9 (LL) 13.0 - 17.0 g/dL    Comment: REPEATED TO VERIFY THIS CRITICAL RESULT HAS VERIFIED AND BEEN CALLED TO RN C KISER BY ALEXIS CRUICKSHANK ON 08 31 2024 AT 1631, AND HAS BEEN READ BACK.     HCT 18.4 (L) 39.0 - 52.0 %   MCV 88.9 80.0 - 100.0 fL   MCH 33.3 26.0 - 34.0 pg   MCHC 37.5 (H) 30.0 - 36.0 g/dL   RDW 60.4 (H) 54.0 - 98.1 %   Platelets 310 150 - 400 K/uL   nRBC 3.8 (H) 0.0 - 0.2 %   Neutrophils Relative % 51 %   Neutro Abs 8.3 (H) 1.7 - 7.7 K/uL   Lymphocytes Relative 32 %   Lymphs Abs 5.1 (H) 0.7 - 4.0 K/uL   Monocytes Relative 13 %   Monocytes Absolute 2.0 (H) 0.1 - 1.0 K/uL   Eosinophils Relative 3 %   Eosinophils Absolute 0.4 0.0 - 0.5 K/uL   Basophils Relative 0 %   Basophils Absolute 0.1 0.0 - 0.1 K/uL   Immature Granulocytes 1 %   Abs Immature Granulocytes 0.08 (H) 0.00 - 0.07 K/uL    Polychromasia PRESENT    Sickle Cells PRESENT    Target Cells PRESENT     Comment: Performed at [facility], [address], [place], [place] [address]  Reticulocytes     Status: Abnormal   Collection Time: 02/05/23  1:35 PM  Result Value Ref Range   Retic Ct Pct 8.3 (H) 0.4 - 3.1 %    Comment: CONFIRMED BY MANUAL DILUTION   RBC. 2.11 (L) 4.22 - 5.81 MIL/uL   Retic Count, Absolute 216.5 (H) 19.0 - 186.0 K/uL   Immature Retic Fract 34.9 (H) 2.3 - 15.9 %    Comment: Performed at [facility], [address], [place], [place] [address]  Troponin I (High Sensitivity)     Status: None   Collection Time: 02/05/23  1:35 PM  Result Value Ref Range   Troponin I (High Sensitivity) 8 <18 ng/L    Comment: (NOTE) Elevated high sensitivity troponin I (hsTnI) values and significant  changes across serial measurements may suggest ACS but many other  chronic and acute conditions are known to elevate hsTnI results.  Refer to the "Links" section for chest pain algorithms and additional  guidance. Performed at [facility], [address], [place], [place] [address]   Resp panel by RT-PCR (RSV, Flu A&B, Covid) Anterior Nasal Swab     Status: None   Collection Time: 02/05/23  4:09 PM   Specimen: Anterior Nasal Swab  Result Value Ref Range   SARS Coronavirus 2 by RT PCR NEGATIVE NEGATIVE    Comment: (NOTE) SARS-CoV-2 target nucleic acids are NOT DETECTED.  The SARS-CoV-2 RNA is generally detectable in upper respiratory specimens during the acute phase of infection. The lowest concentration of SARS-CoV-2 viral copies this assay can detect is 138 copies/mL. A negative result does not preclude SARS-Cov-2 infection and should not be used as the sole basis for treatment or other patient management decisions. A negative result may occur with  improper specimen collection/handling, submission of specimen other than nasopharyngeal swab, presence  of viral mutation(s) within the areas targeted by this assay, and inadequate number of viral copies(<138 copies/mL). A negative  result must be combined with clinical observations, patient history, and epidemiological information. The expected result is Negative.  Fact Sheet for Patients:  BloggerCourse.com  Fact Sheet for Healthcare Providers:  SeriousBroker.it  This test is no t yet approved or cleared by the [place] FDA and  has been authorized for detection and/or diagnosis of SARS-CoV-2 by FDA under an Emergency Use Authorization (EUA). This EUA will remain  in effect (meaning this test can be used) for the duration of the COVID-19 declaration under Section 564(b)(1) of the Act, 21 U.S.C.section 360bbb-3(b)(1), unless the authorization is terminated  or revoked sooner.       Influenza A by PCR NEGATIVE NEGATIVE   Influenza B by PCR NEGATIVE NEGATIVE    Comment: (NOTE) The Xpert Xpress SARS-CoV-2/FLU/RSV plus assay is intended as an aid in the diagnosis of influenza from Nasopharyngeal swab specimens and should not be used as a sole basis for treatment. Nasal washings and aspirates are unacceptable for Xpert Xpress SARS-CoV-2/FLU/RSV testing.  Fact Sheet for Patients: BloggerCourse.com  Fact Sheet for Healthcare Providers: SeriousBroker.it  This test is not yet approved or cleared by the [place] FDA and has been authorized for detection and/or diagnosis of SARS-CoV-2 by FDA under an Emergency Use Authorization (EUA). This EUA will remain in effect (meaning this test can be used) for the duration of the COVID-19 declaration under Section 564(b)(1) of the Act, 21 U.S.C. section 360bbb-3(b)(1), unless the authorization is terminated or revoked.     Resp Syncytial Virus by PCR NEGATIVE NEGATIVE    Comment: (NOTE) Fact Sheet for  Patients: BloggerCourse.com  Fact Sheet for Healthcare Providers: SeriousBroker.it  This test is not yet approved or cleared by the [place] FDA and has been authorized for detection and/or diagnosis of SARS-CoV-2 by FDA under an Emergency Use Authorization (EUA). This EUA will remain in effect (meaning this test can be used) for the duration of the COVID-19 declaration under Section 564(b)(1) of the Act, 21 U.S.C. section 360bbb-3(b)(1), unless the authorization is terminated or revoked.  Performed at [facility], [address], [place], [place] [address]   Troponin I (High Sensitivity)     Status: None   Collection Time: 02/05/23  5:32 PM  Result Value Ref Range   Troponin I (High Sensitivity) 9 <18 ng/L    Comment: (NOTE) Elevated high sensitivity troponin I (hsTnI) values and significant  changes across serial measurements may suggest ACS but many other  chronic and acute conditions are known to elevate hsTnI results.  Refer to the "Links" section for chest pain algorithms and additional  guidance. Performed at [facility], [address], [place], [place] [address]   Procalcitonin     Status: None   Collection Time: 02/05/23  5:32 PM  Result Value Ref Range   Procalcitonin 0.15 ng/mL    Comment:        Interpretation: PCT (Procalcitonin) <= 0.5 ng/mL: Systemic infection (sepsis) is not likely. Local bacterial infection is possible. (NOTE)       Sepsis PCT Algorithm           Lower Respiratory Tract                                      Infection PCT Algorithm    ----------------------------     ----------------------------         PCT < 0.25 ng/mL  PCT < 0.10 ng/mL          Strongly encourage             Strongly discourage   discontinuation of antibiotics    initiation of antibiotics    ----------------------------     -----------------------------        PCT 0.25 - 0.50 ng/mL            PCT 0.10 - 0.25 ng/mL               OR       >80% decrease in PCT            Discourage initiation of                                            antibiotics      Encourage discontinuation           of antibiotics    ----------------------------     -----------------------------         PCT >= 0.50 ng/mL              PCT 0.26 - 0.50 ng/mL               AND        <80% decrease in PCT             Encourage initiation of                                             antibiotics       Encourage continuation           of antibiotics    ----------------------------     -----------------------------        PCT >= 0.50 ng/mL                  PCT > 0.50 ng/mL               AND         increase in PCT                  Strongly encourage                                      initiation of antibiotics    Strongly encourage escalation           of antibiotics                                     -----------------------------                                           PCT <= 0.25 ng/mL                                                 OR                                        >  80% decrease in PCT                                      Discontinue / Do not initiate                                             antibiotics  Performed at [facility], [address], [place], [place] [address]   Type and screen [facility]     Status: None   Collection Time: 02/05/23  5:40 PM  Result Value Ref Range   ABO/RH(D) A POS    Antibody Screen NEG    Sample Expiration      02/08/2023,2359 Performed at [facility], [address], [place], [place] [address]    CT Angio Chest PE W and/or Wo Contrast  Result Date: 02/05/2023 CLINICAL DATA:  Acute chest pain. Sickle cell pain. Concern for pulmonary embolism. Concern for pneumonia. EXAM: CT ANGIOGRAPHY CHEST WITH CONTRAST TECHNIQUE: Multidetector CT imaging of the chest was  performed using the standard protocol during bolus administration of intravenous contrast. Multiplanar CT image reconstructions and MIPs were obtained to evaluate the vascular anatomy. RADIATION DOSE REDUCTION: This exam was performed according to the departmental dose-optimization program which includes automated exposure control, adjustment of the mA and/or kV according to patient size and/or use of iterative reconstruction technique. CONTRAST:  75mL OMNIPAQUE IOHEXOL 350 MG/ML SOLN COMPARISON:  11/02/2010 FINDINGS: Cardiovascular: No filling defects within the pulmonary arteries to suggest acute pulmonary embolism. Streak artifact from the posterior thoracic fusion degrades imaging. Mediastinum/Nodes: No axillary or supraclavicular adenopathy. No mediastinal or hilar adenopathy. No pericardial fluid. Esophagus normal. Lungs/Pleura: There is diffuse ground-glass densities in the upper and lower lobes. No pulmonary infarction. No pneumonia. 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  No acute findings Musculoskeletal: Posterior thoracic fusion. Scoliosis no acute findings Review of the MIP images confirms the above findings. IMPRESSION: 1. No evidence acute pulmonary embolism. 2. Mild diffuse ground-glass densities in the lungs consistent with edema or atelectasis. 3. No pulmonary infarction or pneumonia. Electronically Signed   By: Genevive Bi M.D.   On: 02/05/2023 18:14   DG Chest 2 View  Result Date: 02/05/2023 CLINICAL DATA:  Shortness of breath. EXAM: CHEST - 2 VIEW COMPARISON:  Chest x-ray March 12, 2012. FINDINGS: Mild patchy opacities in both lungs. No visible pleural effusions or pneumothorax. Enlarged cardiac silhouette. Spinal fusion. IMPRESSION: 1. Mild patchy opacities in both lungs, which could represent mild edema or pneumonia. 2. Cardiomegaly. Electronically Signed   By: Feliberto Harts M.D.   On: 02/05/2023 16:10    Pending Labs Unresulted Labs (From admission,  onward)     Start     Ordered   02/05/23 2121  Urinalysis, Complete w Microscopic -Urine, Clean Catch  Once,   R       Question Answer Comment  Release to patient Immediate   Specimen Source Urine, Clean Catch      02/05/23 2120   02/05/23 2121  Urine Culture (for pregnant, neutropenic or urologic patients or patients with an indwelling urinary catheter)  (Urine Labs)  Once,   R       Question:  Indication  Answer:  Dysuria   02/05/23 2120   02/05/23 2042  HIV Antibody (routine testing w rflx)  (HIV Antibody (Routine testing w reflex) panel)  Once,   R        02/05/23 2042   02/05/23 2042  Expectorated Sputum Assessment w Gram Stain, Rflx to Resp Cult  Once,   R        02/05/23 2042   02/05/23 2042  Legionella Pneumophila Serogp 1 Ur Ag  Once,   R        02/05/23 2042   02/05/23 2042  Strep pneumoniae urinary antigen  Once,   R        02/05/23 2042   02/05/23 2041  CK  Add-on,   AD        02/05/23 2040   02/05/23 2041  Magnesium  Add-on,   AD        02/05/23 2040   02/05/23 2041  Phosphorus  Add-on,   AD        02/05/23 2040   02/05/23 2041  TSH  Add-on,   AD        02/05/23 2040   02/05/23 1924  Lactic acid, plasma  (Lactic Acid)  Now then every 2 hours,   R (with STAT occurrences)      02/05/23 1923   02/05/23 1924  Blood culture (routine x 2)  BLOOD CULTURE X 2,   R (with STAT occurrences)      02/05/23 1923   Signed and Held  Comprehensive metabolic panel  Tomorrow morning,   R        Signed and Held   Medical illustrator and Held  Magnesium  Tomorrow morning,   R        Signed and Held   Medical illustrator and Held  Phosphorus  Tomorrow morning,   R        Signed and Held   Signed and Held  CBC with Differential/Platelet  Tomorrow morning,   R        Signed and Held            Vitals/Pain Today's Vitals   02/05/23 1814 02/05/23 1817 02/05/23 2150 02/05/23 2332  BP:      Pulse:      Resp:      Temp: 98.1 F (36.7 C)   98.1 F (36.7 C)  TempSrc: Oral   Oral  SpO2:      Weight:       Height:      PainSc:  7  7      Isolation Precautions No active isolations  Medications Medications  0.45 % sodium chloride infusion ( Intravenous New Bag/Given 02/05/23 1553)  naloxone (NARCAN) injection 0.4 mg (has no administration in time range)    And  sodium chloride flush (NS) 0.9 % injection 9 mL (has no administration in time range)  ondansetron (ZOFRAN) injection 4 mg (has no administration in time range)  diphenhydrAMINE (BENADRYL) capsule 25 mg (has no administration in time range)  cefTRIAXone (ROCEPHIN) 2 g in sodium chloride 0.9 % 100 mL IVPB (has no administration in time range)  azithromycin (ZITHROMAX) 500 mg in sodium chloride 0.9 % 250 mL IVPB (500 mg Intravenous New Bag/Given 02/05/23 2320)  HYDROmorphone (DILAUDID) 1 mg/mL PCA injection (has no administration in time range)  hydroxyurea (HYDREA) capsule 1,500 mg (has no administration in time range)  folic acid (FOLVITE) tablet 1 mg (1 mg Oral Given 02/05/23 2159)  ketorolac (TORADOL) 15 MG/ML injection 15 mg (15 mg Intravenous Given 02/05/23 1552)  HYDROmorphone (DILAUDID) injection  2 mg (2 mg Intravenous Given 02/05/23 1552)  HYDROmorphone (DILAUDID) injection 2 mg (2 mg Intravenous Given 02/05/23 1645)  HYDROmorphone (DILAUDID) injection 2 mg (2 mg Intravenous Given 02/05/23 1738)  iohexol (OMNIPAQUE) 350 MG/ML injection 75 mL (75 mLs Intravenous Contrast Given 02/05/23 1654)  cefTRIAXone (ROCEPHIN) 1 g in sodium chloride 0.9 % 100 mL IVPB (1 g Intravenous New Bag/Given 02/05/23 1933)  HYDROmorphone (DILAUDID) injection 1 mg (1 mg Intravenous Given 02/05/23 2154)    Mobility walks

## 2023-02-05 NOTE — Subjective & Objective (Signed)
Pt presents with typical sickle cell pain started with AM  Pain not controlled by oxycodone Had recent URI that he thinks has triggered the pain crisis No fever no CP

## 2023-02-05 NOTE — ED Triage Notes (Addendum)
Pt reports sickle cell pain crisis that began this morning.  Patient has taken his home oxycodone without relief. His last dose of pain medication was last night.  Patient states he recently had a cold which he believes may have triggered his pain crisis. Pt a/o x 4.

## 2023-02-05 NOTE — ED Provider Notes (Signed)
[facility] AT [facility] Provider Note   CSN: [id number] Arrival date & time: 02/05/23  1249     History  Chief Complaint  Patient presents with   Sickle Cell Pain Crisis    Manuel Mcguire is a 32 y.o. male with history of sickle cell believes this, who presents to the emergency department complaining of pain associated with sickle cell disease.  States that he started experiencing worsening pain this morning, localized to his back.  He tried taking his home oxycodone without relief.  His last dose of pain medication was last night.  He has had a cold recently with associated nasal congestion and productive cough which he feels have contributed to his symptoms.  Denies fever or chest pain.   Sickle Cell Pain Crisis Associated symptoms: cough and shortness of breath   Associated symptoms: no chest pain and no fever        Home Medications Prior to Admission medications   Medication Sig Start Date End Date Taking? Authorizing Provider  clindamycin (CLEOCIN) 150 MG capsule Take 2 capsules (300 mg total) by mouth 3 (three) times daily. May dispense as 150mg  capsules 03/12/12   Muthersbaugh, Dahlia Client, PA-C  folic acid (FOLVITE) 1 MG tablet Take 1 tablet (1 mg total) by mouth daily. 10/27/12   Altha Harm, MD  hydroxyurea (HYDREA) 500 MG capsule Take 3 capsules (1,500 mg total) by mouth daily. May take with food to minimize GI side effects. 10/27/12   Altha Harm, MD  oxyCODONE-acetaminophen (PERCOCET/ROXICET) 5-325 MG per tablet Take 1 tablet by mouth every 4 (four) hours as needed for pain. 10/27/12   Altha Harm, MD      Allergies    Patient has no known allergies.    Review of Systems   Review of Systems  Constitutional:  Negative for fever.  Respiratory:  Positive for cough and shortness of breath.   Cardiovascular:  Negative for chest pain.  Musculoskeletal:  Positive for back pain.  All other systems reviewed and are  negative.   Physical Exam Updated Vital Signs BP 125/79 (BP Location: Right Arm)   Pulse 63   Temp 98.3 F (36.8 C) (Oral)   Resp 20   Ht 5\' 7"  (1.702 m)   Wt 73 kg   SpO2 100%   BMI 25.22 kg/m  Physical Exam Vitals and nursing note reviewed.  Constitutional:      Appearance: Normal appearance.  HENT:     Head: Normocephalic and atraumatic.  Eyes:     Conjunctiva/sclera: Conjunctivae normal.  Cardiovascular:     Rate and Rhythm: Normal rate and regular rhythm.  Pulmonary:     Effort: Pulmonary effort is normal. No respiratory distress.     Breath sounds: Normal breath sounds.     Comments: Found to be 90% on room air, placed on 2 L [place]. Abdominal:     General: There is no distension.     Palpations: Abdomen is soft.     Tenderness: There is no abdominal tenderness.  Skin:    General: Skin is warm and dry.  Neurological:     General: No focal deficit present.     Mental Status: He is alert.     ED Results / Procedures / Treatments   Labs (all labs ordered are listed, but only abnormal results are displayed) Labs Reviewed  RESP PANEL BY RT-PCR (RSV, FLU A&B, COVID)  [id number]  COMPREHENSIVE METABOLIC PANEL  CBC WITH DIFFERENTIAL/PLATELET  RETICULOCYTES  TROPONIN I (HIGH SENSITIVITY)    EKG None  Radiology No results found.  Procedures Procedures    Medications Ordered in ED Medications  ketorolac (TORADOL) 15 MG/ML injection 15 mg (has no administration in time range)  HYDROmorphone (DILAUDID) injection 2 mg (has no administration in time range)  0.45 % sodium chloride infusion (has no administration in time range)    ED Course/ Medical Decision Making/ A&P                                 Medical Decision Making Amount and/or Complexity of Data Reviewed Labs: ordered. Radiology: ordered.   This patient is a 32 y.o. male who presents to the ED for concern of pain associated with sickle cell disease, this involves an extensive number of treatment  options, and is a complaint that carries with it a high risk of complications and morbidity. The emergent differential diagnosis prior to evaluation includes, but is not limited to,  sickle cell crisis, acute chest syndrome (PE, pneumonia, ACS), sequestration syndrome, limb ischemia. This is not an exhaustive differential.   Past Medical History / Co-morbidities / Social History: Sickle cell anemia, scoliosis  Additional history: Chart reviewed. Pertinent results include: Follows with [facility]. Most recent ER visit to outside facility for pain crisis in May 2024, found to have hemoglobin 6.9 and received PRBCs. PDMP reviewed.   Physical Exam: Physical exam performed. The pertinent findings include: Initially hypoxic to 90%, placed on 2 L [place]. Lung sounds clear, normal respiratory effort. Heart RRR.   Lab Tests: I ordered the following labs: CBC, CMP, reticulocytes, troponin, respiratory panel   Imaging Studies: I ordered imaging studies including CXR. Pending at time of shift change.   Cardiac Monitoring:  The patient was maintained on a cardiac monitor.  EKG ordered but not yet obtained at time of shift change.   Medications: I ordered medication including IVF, toradol and dilaudid  for sickle cell pain.   Disposition: Patient discussed and care transferred to [facility] at shift change. Please see his/her note for further details regarding further ED course and disposition. Plan at time of handoff is follow up on patient's labs and reevaluate after pain medication.   Final Clinical Impression(s) / ED Diagnoses Final diagnoses:  Sickle cell pain crisis (HCC)    Rx / DC Orders ED Discharge Orders     None      Portions of this report may have been transcribed using voice recognition software. Every effort was made to ensure accuracy; however, inadvertent computerized transcription errors may be present.    Jeanella Flattery 02/05/23 1500    Wynetta Fines,  MD 02/05/23 1556

## 2023-02-05 NOTE — ED Notes (Signed)
PA Victory Dakin was notified of the Hemoglobin of 6.9

## 2023-02-05 NOTE — Assessment & Plan Note (Signed)
- -  Patient presenting with  productive cough,    hypoxia  , and infiltrate in   lower lobe on chest x-ray -Infiltrate on CXR and 2-3 characteristics (fever, leukocytosis, purulent sputum) are consistent with pneumonia. -This appears to be most likely community-acquired pneumonia vs postviral PAN vs acute chest  will admit for treatment of CAP will start on appropriate antibiotic coverage. - Rocephin/azithromycin   Obtain:  sputum cultures,                   Obtain respiratory panel                    influenza serologies negative                  COVID PCR negative                  blood cultures and sputum cultures ordered                   strep pneumo UA antigen,                  check for Legionella antigen.                Provide oxygen as needed.

## 2023-02-05 NOTE — H&P (Signed)
Manuel Mcguire:[id number] DOB: 09-06-1990 DOA: 02/05/2023     PCP: Pcp, No  sees hematology at [facility]  Patient arrived to ER on 02/05/23 at 1249 Referred by Attending Arby Barrette, MD   Patient coming from:    home Lives alone,        Chief Complaint:  Chief Complaint  Patient presents with   Sickle Cell Pain Crisis    HPI: Manuel Mcguire is a 32 y.o. male with medical history significant of sickle cell, Hemoglobin SS disease     Presented with   SOB  Pt presents with typical sickle cell pain started with AM  Pain not controlled by oxycodone Had recent URI that he thinks has triggered the pain crisis No fever no CP Denies significant ETOH intake   Vapes rarerly  Lab Results  Component Value Date   SARSCOV2NAA NEGATIVE 02/05/2023       Chronic anemia - baseline hg Hemoglobin & Hematocrit  Recent Labs    02/05/23 1335  HGB 6.9*   Iron/TIBC/Ferritin/ %Sat    Component Value Date/Time   FERRITIN 241 10/27/2012 1542    While in ER: Clinical Course as of 02/05/23 2025  Sat Feb 05, 2023  1922 Procalcitonin: 0.15 [RR]    Clinical Course User Index [RR] Achille Rich, PA-C     Lab Orders         Resp panel by RT-PCR (RSV, Flu A&B, Covid) Anterior Nasal Swab         Blood culture (routine x 2)         Comprehensive metabolic panel         CBC with Differential         Reticulocytes         Procalcitonin         Lactic acid, plasma     Found to have O2 sat 90% on RA      CXR - 1. Mild patchy opacities in both lungs, which could represent mild edema or pneumonia. 2. Cardiomegaly.    CTA chest -   no PE,Mild diffuse ground-glass densities in the lungs consistent with edema or atelectasis  Following Medications were ordered in ER: Medications  0.45 % sodium chloride infusion ( Intravenous New Bag/Given 02/05/23 1553)  azithromycin (ZITHROMAX) 500 mg in sodium chloride 0.9 % 250 mL IVPB (has no administration in time range)  ketorolac  (TORADOL) 15 MG/ML injection 15 mg (15 mg Intravenous Given 02/05/23 1552)  HYDROmorphone (DILAUDID) injection 2 mg (2 mg Intravenous Given 02/05/23 1552)  HYDROmorphone (DILAUDID) injection 2 mg (2 mg Intravenous Given 02/05/23 1645)  HYDROmorphone (DILAUDID) injection 2 mg (2 mg Intravenous Given 02/05/23 1738)  iohexol (OMNIPAQUE) 350 MG/ML injection 75 mL (75 mLs Intravenous Contrast Given 02/05/23 1654)  cefTRIAXone (ROCEPHIN) 1 g in sodium chloride 0.9 % 100 mL IVPB (1 g Intravenous New Bag/Given 02/05/23 1933)       ED Triage Vitals  Encounter Vitals Group     BP 02/05/23 1304 125/81     Systolic BP Percentile --      Diastolic BP Percentile --      Pulse Rate 02/05/23 1304 61     Resp 02/05/23 1304 16     Temp 02/05/23 1304 98.3 F (36.8 C)     Temp Source 02/05/23 1304 Oral     SpO2 02/05/23 1304 90 %     Weight 02/05/23 1322 160 lb (72.6 kg)     Height 02/05/23 1322  5\' 7"  (1.702 m)     Head Circumference --      Peak Flow --      Pain Score 02/05/23 1321 7     Pain Loc --      Pain Education --      Exclude from Growth Chart --   WUJW(11)@     _________________________________________ Significant initial  Findings: Abnormal Labs Reviewed  COMPREHENSIVE METABOLIC PANEL - Abnormal; Notable for the following components:      Result Value   Sodium 134 (*)    Calcium 8.8 (*)    Total Protein 8.3 (*)    AST 74 (*)    ALT 52 (*)    Total Bilirubin 2.3 (*)    All other components within normal limits  CBC WITH DIFFERENTIAL/PLATELET - Abnormal; Notable for the following components:   WBC 15.9 (*)    RBC 2.07 (*)    Hemoglobin 6.9 (*)    HCT 18.4 (*)    MCHC 37.5 (*)    RDW 25.4 (*)    nRBC 3.8 (*)    Neutro Abs 8.3 (*)    Lymphs Abs 5.1 (*)    Monocytes Absolute 2.0 (*)    Abs Immature Granulocytes 0.08 (*)    All other components within normal limits  RETICULOCYTES - Abnormal; Notable for the following components:   Retic Ct Pct 8.3 (*)    RBC. 2.11 (*)    Retic  Count, Absolute 216.5 (*)    Immature Retic Fract 34.9 (*)    All other components within normal limits      _________________________ Troponin  ordered Cardiac Panel (last 3 results) Recent Labs    02/05/23 1335 02/05/23 1732  TROPONINIHS 8 9    ECG: Ordered     Lab Results  Component Value Date   SARSCOV2NAA NEGATIVE 02/05/2023     The recent clinical data is shown below. Vitals:   02/05/23 1425 02/05/23 1426 02/05/23 1726 02/05/23 1814  BP: 125/79  133/86   Pulse: 63  79   Resp: 20  18   Temp: 98.3 F (36.8 C)   98.1 F (36.7 C)  TempSrc: Oral   Oral  SpO2: 100%  100%   Weight:  73 kg 73 kg   Height:  5\' 7"  (1.702 m)      WBC     Component Value Date/Time   WBC 15.9 (H) 02/05/2023 1335   LYMPHSABS 5.1 (H) 02/05/2023 1335   MONOABS 2.0 (H) 02/05/2023 1335   EOSABS 0.4 02/05/2023 1335   BASOSABS 0.1 02/05/2023 1335     Procalcitonin  0.15      UA   ordered     Results for orders placed or performed during the hospital encounter of 02/05/23  Resp panel by RT-PCR (RSV, Flu A&B, Covid) Anterior Nasal Swab     Status: None   Collection Time: 02/05/23  4:09 PM   Specimen: Anterior Nasal Swab  Result Value Ref Range Status   SARS Coronavirus 2 by RT PCR NEGATIVE NEGATIVE Final         Influenza A by PCR NEGATIVE NEGATIVE Final   Influenza B by PCR NEGATIVE NEGATIVE Final         Resp Syncytial Virus by PCR NEGATIVE NEGATIVE Final          ABX started Antibiotics Given (last 72 hours)     Date/Time Action Medication Dose Rate   02/05/23 1933 New Bag/Given   cefTRIAXone (ROCEPHIN) 1  g in sodium chloride 0.9 % 100 mL IVPB 1 g 200 mL/hr       __________________________________________________________ Recent Labs  Lab 02/05/23 1335  NA 134*  K 4.0  CO2 22  GLUCOSE 87  BUN 13  CREATININE 0.84  CALCIUM 8.8*    Cr   stable,    Lab Results  Component Value Date   CREATININE 0.84 02/05/2023   CREATININE 0.84 10/27/2012   CREATININE 0.91  03/12/2012    Recent Labs  Lab 02/05/23 1335  AST 74*  ALT 52*  ALKPHOS 110  BILITOT 2.3*  PROT 8.3*  ALBUMIN 4.0   Lab Results  Component Value Date   CALCIUM 8.8 (L) 02/05/2023    Plt: Lab Results  Component Value Date   PLT 310 02/05/2023       Recent Labs  Lab 02/05/23 1335  WBC 15.9*  NEUTROABS 8.3*  HGB 6.9*  HCT 18.4*  MCV 88.9  PLT 310    HG/HCT  stable,     Component Value Date/Time   HGB 6.9 (LL) 02/05/2023 1335   HCT 18.4 (L) 02/05/2023 1335   MCV 88.9 02/05/2023 1335    _______________________________________________ Hospitalist was called for admission for   Sickle cell pain crisis and CAP vs acute chest  The following Work up has been ordered so far:  Orders Placed This Encounter  Procedures   Resp panel by RT-PCR (RSV, Flu A&B, Covid) Anterior Nasal Swab   Blood culture (routine x 2)   DG Chest 2 View   CT Angio Chest PE W and/or Wo Contrast   Comprehensive metabolic panel   CBC with Differential   Reticulocytes   Procalcitonin   Lactic acid, plasma   Monitor O2 SATs   Document Actual / Estimated Weight   If O2 sat   Vital signs with O2 sat, q1hour   ED Cardiac monitoring   Weigh patient in Kg   Saline Lock IV-Maintain IV access   Patient may eat/drink   Initiate Carrier Fluid Protocol   Consult to hospitalist   Oxygen therapy Mode or (Route): Nasal cannula; Keep 02 saturation: >94%   ED EKG   Type and screen [facility]     OTHER Significant initial  Findings:  labs showing:     DM  labs:  HbA1C: No results for input(s): "HGBA1C" in the last 8760 hours.     CBG (last 3)  No results for input(s): "GLUCAP" in the last 72 hours.        Cultures:    Component Value Date/Time   SDES  11/01/2010 2305    BLOOD BOTTLES DRAWN AEROBIC AND ANAEROBIC 7CC L HAND   SPECREQUEST IMMUNE:NORM VAR 11/01/2010 2305   CULT NO GROWTH 5 DAYS 11/01/2010 2305   REPTSTATUS 11/08/2010 FINAL 11/01/2010 2305      Radiological Exams on Admission: CT Angio Chest PE W and/or Wo Contrast  Result Date: 02/05/2023 CLINICAL DATA:  Acute chest pain. Sickle cell pain. Concern for pulmonary embolism. Concern for pneumonia. EXAM: CT ANGIOGRAPHY CHEST WITH CONTRAST TECHNIQUE: Multidetector CT imaging of the chest was performed using the standard protocol during bolus administration of intravenous contrast. Multiplanar CT image reconstructions and MIPs were obtained to evaluate the vascular anatomy. RADIATION DOSE REDUCTION: This exam was performed according to the departmental dose-optimization program which includes automated exposure control, adjustment of the mA and/or kV according to patient size and/or use of iterative reconstruction technique. CONTRAST:  75mL OMNIPAQUE IOHEXOL 350 MG/ML SOLN COMPARISON:  11/02/2010 FINDINGS: Cardiovascular: No filling defects within the pulmonary arteries to suggest acute pulmonary embolism. Streak artifact from the posterior thoracic fusion degrades imaging. Mediastinum/Nodes: No axillary or supraclavicular adenopathy. No mediastinal or hilar adenopathy. No pericardial fluid. Esophagus normal. Lungs/Pleura: There is diffuse ground-glass densities in the upper and lower lobes. No pulmonary infarction. No pneumonia. 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  No acute findings Musculoskeletal: Posterior thoracic fusion. Scoliosis no acute findings Review of the MIP images confirms the above findings. IMPRESSION: 1. No evidence acute pulmonary embolism. 2. Mild diffuse ground-glass densities in the lungs consistent with edema or atelectasis. 3. No pulmonary infarction or pneumonia. Electronically Signed   By: Genevive Bi M.D.   On: 02/05/2023 18:14   DG Chest 2 View  Result Date: 02/05/2023 CLINICAL DATA:  Shortness of breath. EXAM: CHEST - 2 VIEW COMPARISON:  Chest x-ray March 12, 2012. FINDINGS: Mild patchy opacities in both lungs. No visible pleural effusions or  pneumothorax. Enlarged cardiac silhouette. Spinal fusion. IMPRESSION: 1. Mild patchy opacities in both lungs, which could represent mild edema or pneumonia. 2. Cardiomegaly. Electronically Signed   By: Feliberto Harts M.D.   On: 02/05/2023 16:10   _______________________________________________________________________________________________________ Latest  Blood pressure 133/86, pulse 79, temperature 98.1 F (36.7 C), temperature source Oral, resp. rate 18, height 5\' 7"  (1.702 m), weight 73 kg, SpO2 100%.   Vitals  labs and radiology finding personally reviewed  Review of Systems:    Pertinent positives include:   productive cough, , chills,   Constitutional:  No weight loss, night sweats, Feversfatigue, weight loss  HEENT:  No headaches, Difficulty swallowing,Tooth/dental problems,Sore throat,  No sneezing, itching, ear ache, nasal congestion, post nasal drip,  Cardio-vascular:  No chest pain, Orthopnea, PND, anasarca, dizziness, palpitations.no Bilateral lower extremity swelling  GI:  No heartburn, indigestion, abdominal pain, nausea, vomiting, diarrhea, change in bowel habits, loss of appetite, melena, blood in stool, hematemesis Resp:  no shortness of breath at rest. No dyspnea on exertion, No excess mucus, noNo non-productive cough, No coughing up of blood.No change in color of mucus.No wheezing. Skin:  no rash or lesions. No jaundice GU:  no dysuria, change in color of urine, no urgency or frequency. No straining to urinate.  No flank pain.  Musculoskeletal:  No joint pain or no joint swelling. No decreased range of motion. No back pain.  Psych:  No change in mood or affect. No depression or anxiety. No memory loss.  Neuro: no localizing neurological complaints, no tingling, no weakness, no double vision, no gait abnormality, no slurred speech, no confusion  All systems reviewed and apart from HOPI all are  negative _______________________________________________________________________________________________ Past Medical History:   Past Medical History:  Diagnosis Date   Scoliosis    Sickle cell anemia with pain (HCC)       No past surgical history on file.  Social History:  Ambulatory   independently       reports that he has never smoked. He does not have any smokeless tobacco history on file. He reports that he does not drink alcohol and does not use drugs.     Family History:  Family History  Problem Relation Age of Onset   Diabetes Mother    Hypertension Mother    Sickle cell anemia Sister    ______________________________________________________________________________________________ Allergies: No Known Allergies   Prior to Admission medications   Medication Sig Start Date End Date Taking? Authorizing Provider  clindamycin (CLEOCIN) 150 MG capsule Take 2 capsules (  300 mg total) by mouth 3 (three) times daily. May dispense as 150mg  capsules 03/12/12   Muthersbaugh, Dahlia Client, PA-C  folic acid (FOLVITE) 1 MG tablet Take 1 tablet (1 mg total) by mouth daily. 10/27/12   Altha Harm, MD  hydroxyurea (HYDREA) 500 MG capsule Take 3 capsules (1,500 mg total) by mouth daily. May take with food to minimize GI side effects. 10/27/12   Altha Harm, MD  oxyCODONE-acetaminophen (PERCOCET/ROXICET) 5-325 MG per tablet Take 1 tablet by mouth every 4 (four) hours as needed for pain. 10/27/12   Altha Harm, MD    ___________________________________________________________________________________________________ Physical Exam:    02/05/2023    5:26 PM 02/05/2023    2:26 PM 02/05/2023    2:25 PM  Vitals with BMI  Height  5\' 7"    Weight 161 lbs 161 lbs   BMI 25.21 25.21   Systolic 133  125  Diastolic 86  79  Pulse 79  63     1. General:  in No  Acute distress   Chronically ill   -appearing 2. Psychological: Alert and   Oriented 3. Head/ENT:    Dry Mucous  Membranes                          Head Non traumatic, neck supple                         Poor Dentition 4. SKIN: decreased Skin turgor,  Skin clean Dry and intact no rash    5. Heart: Regular rate and rhythm no  Murmur, no Rub or gallop 6. Lungs: no wheezes or crackles   7. Abdomen: Soft,  non-tender, Non distended bowel sounds present 8. Lower extremities: no clubbing, cyanosis, no  edema 9. Neurologically Grossly intact, moving all 4 extremities equally   10. MSK: Normal range of motion    Chart has been reviewed  ______________________________________________________________________________________________  Assessment/Plan 32 y.o. male with medical history significant of sickle cell, Hemoglobin SS disease   Admitted for   Sickle cell pain crisis, CAP vs acute chest   Present on Admission:  Sickle cell crisis (HCC)  Acute respiratory failure with hypoxia (HCC)  CAP (community acquired pneumonia)     Sickle cell crisis (HCC) - will admit per sickle cell protocol,    control pain,    hydrate with IVF D5 .45% Saline @ 100 mls/hour,    Weight based Dilaudid PCA for opioid tolerant patients.    continue hydroxyurea and folic acid   Transfuse as needed if Hg drops significantly below baseline. He reports have had blood transfusion in the past back in May for HG of 6.9 but states he gets itchy and has chills with blood transfusion  Will obtain type and screen Monitor Hg may need 1 UNIt if HG cont to drop defer to sickle cell team in am     Sickle cell team to take over management in AM    Acute respiratory failure with hypoxia [facility])  this patient has acute respiratory failure with Hypoxia  as documented by the presence of following: O2 saturatio< 90% on RA  Likely due to:   Pneumonia,vas acute chest Provide O2 therapy and titrate as needed  Continuous pulse ox   check Pulse ox with ambulation prior to discharge    flutter valve ordered   CAP (community acquired  pneumonia)  - -Patient presenting with  productive cough,  hypoxia  , and infiltrate in   lower lobe on chest x-ray -Infiltrate on CXR and 2-3 characteristics (fever, leukocytosis, purulent sputum) are consistent with pneumonia. -This appears to be most likely community-acquired pneumonia vs postviral PAN vs acute chest  will admit for treatment of CAP will start on appropriate antibiotic coverage. - Rocephin/azithromycin   Obtain:  sputum cultures,                   Obtain respiratory panel                    influenza serologies negative                  COVID PCR negative                  blood cultures and sputum cultures ordered                   strep pneumo UA antigen,                  check for Legionella antigen.                Provide oxygen as needed.     Other plan as per orders.  DVT prophylaxis:   Lovenox       Code Status:    Code Status: Not on file FULL CODE   as per patient   I had personally discussed CODE STATUS with patient and family  ACP   none    Family Communication:   Family  at  Bedside  plan of care was discussed  with  Brother  Diet heart healthy   Disposition Plan:    To home once workup is complete and patient is stable   Following barriers for discharge:                                                        Pain controlled with PO medications                              CBC stable    Consult Orders  (From admission, onward)           Start     Ordered   02/05/23 2005  Consult to hospitalist  Once       Provider:  (Not yet assigned)  Question Answer Comment  Place call to: [facility]   Reason for Consult Admit      02/05/23 2004               Consults called: none   Admission status:  ED Disposition     ED Disposition  Admit   Condition  --   Comment  Hospital Area: [facility] [[id number]]  Level of Care: Telemetry [5]  Admit to tele based on following criteria: Other see comments   Comments: acute chest  May admit patient to [facility] or [facility] if equivalent level of care is available:: No  Covid Evaluation: Asymptomatic - no recent exposure (last 10 days) testing not required  Diagnosis: Sickle cell crisis [facility]) [[id number]]  Admitting Physician: Therisa Doyne [[id number]]  Attending Physician: Therisa Doyne [[id number]]  Certification:: I certify this  patient will need inpatient services for at least 2 midnights  Expected Medical Readiness: 02/08/2023            inpatient     I Expect 2 midnight stay secondary to severity of patient's current illness need for inpatient interventions justified by the following:  hemodynamic instability despite optimal treatment ( hypoxia, )  Severe lab/radiological/exam abnormalities including:    CAP and extensive comorbidities including:  sickle cell disease   That are currently affecting medical management.   I expect  patient to be hospitalized for 2 midnights requiring inpatient medical care.  Patient is at high risk for adverse outcome (such as loss of life or disability) if not treated.  Indication for inpatient stay as follows:    New or worsening hypoxia   Need for IV antibiotics, IV fluids    Level of care     tele  For 24H     Lab Results  Component Value Date   SARSCOV2NAA NEGATIVE 02/05/2023     Precautions: admitted as   Covid Negative   Kinsley Holderman 02/05/2023, 9:23 PM    [facility]     after 2 AM please page floor coverage PA If 7AM-7PM, please contact the day team taking care of the patient using [email or web address]

## 2023-02-05 NOTE — ED Provider Notes (Signed)
Physical Exam  BP 125/79 (BP Location: Right Arm)   Pulse 63   Temp 98.3 F (36.8 C) (Oral)   Resp 20   Ht 5\' 7"  (1.702 m)   Wt 73 kg   SpO2 100%   BMI 25.22 kg/m   Physical Exam Vitals and nursing note reviewed.  Constitutional:      Appearance: He is not toxic-appearing.     Comments: Uncomfortable, but nontoxic-appearing  HENT:     Mouth/Throat:     Mouth: Mucous membranes are moist.  Cardiovascular:     Rate and Rhythm: Normal rate.  Pulmonary:     Effort: Pulmonary effort is normal. No respiratory distress.     Comments: Slight crackles heard at bilateral bases Abdominal:     Tenderness: There is no abdominal tenderness. There is no guarding or rebound.  Musculoskeletal:     Cervical back: Normal range of motion.     Comments: Diffuse back tenderness palpation.  No focal tenderness.  Moving all extremities.  Compartments soft.  Skin:    General: Skin is warm and dry.  Neurological:     General: No focal deficit present.     Mental Status: He is alert. Mental status is at baseline.    Procedures  .Critical Care  Performed by: Achille Rich, PA-C Authorized by: Achille Rich, PA-C   Critical care provider statement:    Critical care time (minutes):  45   Critical care was necessary to treat or prevent imminent or life-threatening deterioration of the following conditions: Pain crisis.   Critical care was time spent personally by me on the following activities:  Development of treatment plan with patient or surrogate, discussions with consultants, evaluation of patient's response to treatment, examination of patient, ordering and review of laboratory studies, ordering and review of radiographic studies, ordering and performing treatments and interventions, pulse oximetry, re-evaluation of patient's condition and review of old charts   Care discussed with: admitting provider   Comments:     Sickle cell pain crisis requiring multiple doses of pain medication as well  as new oxygen requirement concern for acute chest.   ED Course / MDM   Clinical Course as of 02/06/23 0252  Sat Feb 05, 2023  1922 Procalcitonin: 0.15 [RR]    Clinical Course User Index [RR] Achille Rich, PA-C   Medical Decision Making Amount and/or Complexity of Data Reviewed Labs: ordered. Decision-making details documented in ED Course. Radiology: ordered.  Risk Prescription drug management. Decision regarding hospitalization.   Accepted handoff at shift change from [employer], PA-C. Please see prior provider note for more detail.   Briefly: Patient is 32 y.o. M presenting to the ER for evaluation of diffuse back pain. Consistent with his sickle cell pain. He was also found to be at 90% on RA and was placed on 2L. Difficulty with obtaining IV access so labs are delayed.  DDX: concern for lab abnormality, sickle cell crisis  Plan: follow up with labs.   I evaluated the patient at bedside.  He complains of diffuse back pain that feels like his typical sickle cell crisis pain.  He reports has been having some nasal gesturing and cough for the past few days as well as some shortness of breath but denies any chest pain.  He denies any history of any acute chest.  He has tried his at home pain medication without success.  The patient is been here for a few hours without success with labs and has not got  any pain medication, I did order him some IM pain medication in the meantime.  IV access obtained by IV team and he continue to get IV pain medication and labs are received.  I independently reviewed and interpreted the patient's labs.  CMP shows mild decreased sodium 134, calcium mildly decreased with total protein mildly increased.  AST and ALT mildly increased 74 and 52.  Total bili mildly increased at 2.3 as well.  CBC does show hemoglobin at 6.9 although from previous chart evaluation, patient is baseline is in the sevens.  He does have a leukocytosis with a left shift but  also has an increase in his lymphocyte count and his monocyte count.  Reticulocyte saw counts within his normal range.  Troponin flat at 9.  Procalcitonin 0.15.  Urinalysis with small amount of he will of 30 protein otherwise no other electrolyte abnormality.  Lactic acid within normal limits.  COVID, flu, RSV negative.  EKGs were not crossing electronically over into the chart however there was reviewed by my attending.  Chest x-ray shows  1. Mild patchy opacities in both lungs, which could represent mild  edema or pneumonia.  2. Cardiomegaly.   Given the chest x-ray findings, I did order CT angio personally for acute chest versus pneumonia versus PE.  CT angio chest findings show  1. No evidence acute pulmonary embolism.  2. Mild diffuse ground-glass densities in the lungs consistent with  edema or atelectasis.  3. No pulmonary infarction or pneumonia.   Main concern for these groundglass densities within this be pneumonia however there is a no pneumonia or possible acute chest, was recommended to start on antibiotics by my attending.  Rocephin and azithromycin started.  Patient's pain has improved some with the Dilaudid but he still having pain.  Will admit him for pain crisis and new oxygen requirement for concern for acute chest.  Will defer blood transfusion decision to admitting team.  Admit to Dr. Adela Glimpse.  I discussed this case with my attending physician who cosigned this note including patient's presenting symptoms, physical exam, and planned diagnostics and interventions. Attending physician stated agreement with plan or made changes to plan which were implemented.   Attending physician assessed patient at bedside.  Results for orders placed or performed during the hospital encounter of 02/05/23  Resp panel by RT-PCR (RSV, Flu A&B, Covid) Anterior Nasal Swab   Specimen: Anterior Nasal Swab  Result Value Ref Range   SARS Coronavirus 2 by RT PCR NEGATIVE NEGATIVE   Influenza A  by PCR NEGATIVE NEGATIVE   Influenza B by PCR NEGATIVE NEGATIVE   Resp Syncytial Virus by PCR NEGATIVE NEGATIVE  Comprehensive metabolic panel  Result Value Ref Range   Sodium 134 (L) 135 - 145 mmol/L   Potassium 4.0 3.5 - 5.1 mmol/L   Chloride 106 98 - 111 mmol/L   CO2 22 22 - 32 mmol/L   Glucose, Bld 87 70 - 99 mg/dL   BUN 13 6 - 20 mg/dL   Creatinine, Ser 2.44 0.61 - 1.24 mg/dL   Calcium 8.8 (L) 8.9 - 10.3 mg/dL   Total Protein 8.3 (H) 6.5 - 8.1 g/dL   Albumin 4.0 3.5 - 5.0 g/dL   AST 74 (H) 15 - 41 U/L   ALT 52 (H) 0 - 44 U/L   Alkaline Phosphatase 110 38 - 126 U/L   Total Bilirubin 2.3 (H) 0.3 - 1.2 mg/dL   GFR, Estimated >01 >02 mL/min   Anion gap 6 5 -  15  CBC with Differential  Result Value Ref Range   WBC 15.9 (H) 4.0 - 10.5 K/uL   RBC 2.07 (L) 4.22 - 5.81 MIL/uL   Hemoglobin 6.9 (LL) 13.0 - 17.0 g/dL   HCT 96.0 (L) 45.4 - 09.8 %   MCV 88.9 80.0 - 100.0 fL   MCH 33.3 26.0 - 34.0 pg   MCHC 37.5 (H) 30.0 - 36.0 g/dL   RDW 11.9 (H) 14.7 - 82.9 %   Platelets 310 150 - 400 K/uL   nRBC 3.8 (H) 0.0 - 0.2 %   Neutrophils Relative % 51 %   Neutro Abs 8.3 (H) 1.7 - 7.7 K/uL   Lymphocytes Relative 32 %   Lymphs Abs 5.1 (H) 0.7 - 4.0 K/uL   Monocytes Relative 13 %   Monocytes Absolute 2.0 (H) 0.1 - 1.0 K/uL   Eosinophils Relative 3 %   Eosinophils Absolute 0.4 0.0 - 0.5 K/uL   Basophils Relative 0 %   Basophils Absolute 0.1 0.0 - 0.1 K/uL   Immature Granulocytes 1 %   Abs Immature Granulocytes 0.08 (H) 0.00 - 0.07 K/uL   Polychromasia PRESENT    Sickle Cells PRESENT    Target Cells PRESENT   Reticulocytes  Result Value Ref Range   Retic Ct Pct 8.3 (H) 0.4 - 3.1 %   RBC. 2.11 (L) 4.22 - 5.81 MIL/uL   Retic Count, Absolute 216.5 (H) 19.0 - 186.0 K/uL   Immature Retic Fract 34.9 (H) 2.3 - 15.9 %  Procalcitonin  Result Value Ref Range   Procalcitonin 0.15 ng/mL  Lactic acid, plasma  Result Value Ref Range   Lactic Acid, Venous 0.8 0.5 - 1.9 mmol/L  CK  Result  Value Ref Range   Total CK 171 49 - 397 U/L  Magnesium  Result Value Ref Range   Magnesium 2.0 1.7 - 2.4 mg/dL  Phosphorus  Result Value Ref Range   Phosphorus 5.2 (H) 2.5 - 4.6 mg/dL  TSH  Result Value Ref Range   TSH 3.508 0.350 - 4.500 uIU/mL  Urinalysis, Complete w Microscopic -Urine, Clean Catch  Result Value Ref Range   Color, Urine YELLOW YELLOW   APPearance CLEAR CLEAR   Specific Gravity, Urine 1.026 1.005 - 1.030   pH 5.0 5.0 - 8.0   Glucose, UA NEGATIVE NEGATIVE mg/dL   Hgb urine dipstick SMALL (A) NEGATIVE   Bilirubin Urine NEGATIVE NEGATIVE   Ketones, ur NEGATIVE NEGATIVE mg/dL   Protein, ur 30 (A) NEGATIVE mg/dL   Nitrite NEGATIVE NEGATIVE   Leukocytes,Ua NEGATIVE NEGATIVE   RBC / HPF 0-5 0 - 5 RBC/hpf   WBC, UA 0-5 0 - 5 WBC/hpf   Bacteria, UA NONE SEEN NONE SEEN   Squamous Epithelial / HPF 0-5 0 - 5 /HPF   Hyaline Casts, UA PRESENT   Type and screen [facility]  Result Value Ref Range   ABO/RH(D) A POS    Antibody Screen NEG    Sample Expiration      02/08/2023,2359 Performed at [facility], [address], [place], [place] [address]   Troponin I (High Sensitivity)  Result Value Ref Range   Troponin I (High Sensitivity) 8 <18 ng/L  Troponin I (High Sensitivity)  Result Value Ref Range   Troponin I (High Sensitivity) 9 <18 ng/L   CT Angio Chest PE W and/or Wo Contrast  Result Date: 02/05/2023 CLINICAL DATA:  Acute chest pain. Sickle cell pain. Concern for pulmonary embolism. Concern for  pneumonia. EXAM: CT ANGIOGRAPHY CHEST WITH CONTRAST TECHNIQUE: Multidetector CT imaging of the chest was performed using the standard protocol during bolus administration of intravenous contrast. Multiplanar CT image reconstructions and MIPs were obtained to evaluate the vascular anatomy. RADIATION DOSE REDUCTION: This exam was performed according to the departmental dose-optimization program which includes automated exposure control,  adjustment of the mA and/or kV according to patient size and/or use of iterative reconstruction technique. CONTRAST:  75mL OMNIPAQUE IOHEXOL 350 MG/ML SOLN COMPARISON:  11/02/2010 FINDINGS: Cardiovascular: No filling defects within the pulmonary arteries to suggest acute pulmonary embolism. Streak artifact from the posterior thoracic fusion degrades imaging. Mediastinum/Nodes: No axillary or supraclavicular adenopathy. No mediastinal or hilar adenopathy. No pericardial fluid. Esophagus normal. Lungs/Pleura: There is diffuse ground-glass densities in the upper and lower lobes. No pulmonary infarction. No pneumonia. 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  No acute findings Musculoskeletal: Posterior thoracic fusion. Scoliosis no acute findings Review of the MIP images confirms the above findings. IMPRESSION: 1. No evidence acute pulmonary embolism. 2. Mild diffuse ground-glass densities in the lungs consistent with edema or atelectasis. 3. No pulmonary infarction or pneumonia. Electronically Signed   By: Genevive Bi M.D.   On: 02/05/2023 18:14   DG Chest 2 View  Result Date: 02/05/2023 CLINICAL DATA:  Shortness of breath. EXAM: CHEST - 2 VIEW COMPARISON:  Chest x-ray March 12, 2012. FINDINGS: Mild patchy opacities in both lungs. No visible pleural effusions or pneumothorax. Enlarged cardiac silhouette. Spinal fusion. IMPRESSION: 1. Mild patchy opacities in both lungs, which could represent mild edema or pneumonia. 2. Cardiomegaly. Electronically Signed   By: Feliberto Harts M.D.   On: 02/05/2023 16:10        Achille Rich, PA-C 02/06/23 0315    Arby Barrette, MD 02/07/23 [phone]

## 2023-02-05 NOTE — Assessment & Plan Note (Signed)
this patient has acute respiratory failure with Hypoxia  as documented by the presence of following: O2 saturatio< 90% on RA  Likely due to:   Pneumonia,vas acute chest Provide O2 therapy and titrate as needed  Continuous pulse ox   check Pulse ox with ambulation prior to discharge    flutter valve ordered

## 2023-02-06 ENCOUNTER — Encounter (HOSPITAL_COMMUNITY): Payer: Self-pay | Admitting: Internal Medicine

## 2023-02-06 LAB — URINALYSIS, COMPLETE (UACMP) WITH MICROSCOPIC
Bacteria, UA: NONE SEEN
Bilirubin Urine: NEGATIVE
Glucose, UA: NEGATIVE mg/dL
Ketones, ur: NEGATIVE mg/dL
Leukocytes,Ua: NEGATIVE
Nitrite: NEGATIVE
Protein, ur: 30 mg/dL — AB
Specific Gravity, Urine: 1.026 (ref 1.005–1.030)
pH: 5 (ref 5.0–8.0)

## 2023-02-06 LAB — COMPREHENSIVE METABOLIC PANEL
ALT: 43 U/L (ref 0–44)
AST: 64 U/L — ABNORMAL HIGH (ref 15–41)
Albumin: 3.8 g/dL (ref 3.5–5.0)
Alkaline Phosphatase: 99 U/L (ref 38–126)
Anion gap: 10 (ref 5–15)
BUN: 16 mg/dL (ref 6–20)
CO2: 21 mmol/L — ABNORMAL LOW (ref 22–32)
Calcium: 8.8 mg/dL — ABNORMAL LOW (ref 8.9–10.3)
Chloride: 107 mmol/L (ref 98–111)
Creatinine, Ser: 0.9 mg/dL (ref 0.61–1.24)
GFR, Estimated: >60 mL/min (ref >60)
Glucose, Bld: 106 mg/dL — ABNORMAL HIGH (ref 70–99)
Potassium: 3.7 mmol/L (ref 3.5–5.1)
Sodium: 138 mmol/L (ref 135–145)
Total Bilirubin: 2.3 mg/dL — ABNORMAL HIGH (ref 0.3–1.2)
Total Protein: 7.7 g/dL (ref 6.5–8.1)

## 2023-02-06 LAB — CBC WITH DIFFERENTIAL/PLATELET
Abs Immature Granulocytes: 0.12 10*3/uL — ABNORMAL HIGH (ref 0.00–0.07)
Basophils Absolute: 0.1 10*3/uL (ref 0.0–0.1)
Basophils Relative: 0 %
Eosinophils Absolute: 0.5 10*3/uL (ref 0.0–0.5)
Eosinophils Relative: 3 %
HCT: 16.5 % — ABNORMAL LOW (ref 39.0–52.0)
Hemoglobin: 6.4 g/dL — CL (ref 13.0–17.0)
Immature Granulocytes: 1 %
Lymphocytes Relative: 33 %
Lymphs Abs: 5.2 10*3/uL — ABNORMAL HIGH (ref 0.7–4.0)
MCH: 33.9 pg (ref 26.0–34.0)
MCHC: 38.8 g/dL — ABNORMAL HIGH (ref 30.0–36.0)
MCV: 87.3 fL (ref 80.0–100.0)
Monocytes Absolute: 2 10*3/uL — ABNORMAL HIGH (ref 0.1–1.0)
Monocytes Relative: 12 %
Neutro Abs: 8 10*3/uL — ABNORMAL HIGH (ref 1.7–7.7)
Neutrophils Relative %: 51 %
Platelets: 320 10*3/uL (ref 150–400)
RBC: 1.89 MIL/uL — ABNORMAL LOW (ref 4.22–5.81)
RDW: 26.7 % — ABNORMAL HIGH (ref 11.5–15.5)
WBC: 15.8 10*3/uL — ABNORMAL HIGH (ref 4.0–10.5)
nRBC: 4.2 % — ABNORMAL HIGH (ref 0.0–0.2)

## 2023-02-06 LAB — PHOSPHORUS
Phosphorus: 5.2 mg/dL — ABNORMAL HIGH (ref 2.5–4.6)
Phosphorus: 5.1 mg/dL — ABNORMAL HIGH (ref 2.5–4.6)

## 2023-02-06 LAB — HIV ANTIBODY (ROUTINE TESTING W REFLEX): HIV Screen 4th Generation wRfx: NONREACTIVE

## 2023-02-06 LAB — CK: Total CK: 171 U/L (ref 49–397)

## 2023-02-06 LAB — STREP PNEUMONIAE URINARY ANTIGEN: Strep Pneumo Urinary Antigen: NEGATIVE

## 2023-02-06 LAB — TSH: TSH: 3.508 u[IU]/mL (ref 0.350–4.500)

## 2023-02-06 LAB — LACTIC ACID, PLASMA
Lactic Acid, Venous: 0.8 mmol/L (ref 0.5–1.9)
Lactic Acid, Venous: 0.9 mmol/L (ref 0.5–1.9)

## 2023-02-06 LAB — MAGNESIUM
Magnesium: 2 mg/dL (ref 1.7–2.4)
Magnesium: 2 mg/dL (ref 1.7–2.4)

## 2023-02-06 MED ORDER — SODIUM CHLORIDE 0.9% IV SOLUTION: Freq: Once | INTRAVENOUS | Status: DC

## 2023-02-06 NOTE — Plan of Care (Signed)

## 2023-02-06 NOTE — Plan of Care (Signed)
  Problem: Respiratory: Goal: Pulmonary complications will be avoided or minimized Outcome: Progressing

## 2023-02-06 NOTE — Progress Notes (Signed)
PROGRESS NOTE    Manuel Mcguire  WUJ:[id number] DOB: 07/12/1990 DOA: 02/05/2023 PCP: Pcp, No  Outpatient Specialists:     Brief Narrative:  Patient is a 32 year old African-American male with history of sickle cell anemia (Hb SS disease).  Patient was admitted with pain and shortness of breath.  Patient is on IV fluids.  Pain is controlled.  Patient is also on IV antibiotics due to abnormal findings on chest x-ray.  Hemoglobin was 6.4 g/dL on presentation.  Will transfuse with 2 units of packed red blood cells.  Significant leukocytosis is noted (WBC of 15.8).  CTA chest revealed: 1. No evidence acute pulmonary embolism. 2. Mild diffuse ground-glass densities in the lungs consistent with edema or atelectasis. 3. No pulmonary infarction or pneumonia.  Chest x-ray revealed: 1. Mild patchy opacities in both lungs, which could represent mild edema or pneumonia. 2. Cardiomegaly.  02/06/2023: Patient seen.  Pain is controlled.  No shortness of breath.  No constitutional symptoms endorsed.  Patient will be transfused with 2 units of packed red blood cells.  Hold IV fluid whilst transfusing patient with packed red blood cells.  Adequate and aggressive hydration.  Ensure adequate pain control.  Continue folic acid and hydroxyurea.   Assessment & Plan:   Principal Problem:   Sickle cell crisis (HCC) Active Problems:   Acute respiratory failure with hypoxia (HCC)   CAP (community acquired pneumonia)   Sickle cell crisis (HCC) -Aggressive hydration. -Optimize pain control. -Continue folic acid and hydroxyurea. -Transfuse patient with packed red blood cells. -Have a low threshold to discontinue antibiotics.  Sickle cell team to take over management in AM   Acute respiratory failure with hypoxia (HCC) -Hypoxia has resolved significantly. -Transfuse patient with 2 units of packed red blood cells.     -Flutter valve device.   CAP (community acquired pneumonia)-I doubt: -See CTA  chest report. -Patient presenting with  productive cough, hypoxia  , and infiltrate in lower lobe on chest x-ray -Possible viral syndrome. -Influenza serologies negative -COVID PCR negative    -Follow-up blood cultures and sputum cultures  -Follow-up strep pneumo UA antigen and legionella antigen. -Provide oxygen as needed.    Anemia: -Hemoglobin of 6.4 g/dL. -Transfused with 2 units of packed red blood cells.  DVT prophylaxis: Subcutaneous Lovenox. Code Status: Full code. Family Communication:  Disposition Plan: Home eventually.   Consultants:  None.  Procedures:  .  Antimicrobials:  IV azithromycin. IV Rocephin. Have low threshold to discontinue antibiotics.   Subjective: No new complaints. No shortness of breath. No fever or chills. Pain is controlled.  Objective: Vitals:   02/06/23 0405 02/06/23 0445 02/06/23 0828 02/06/23 0858  BP:  (!) 142/103 128/72   Pulse:  90 72   Resp: 17 16 16 13   Temp:  99.2 F (37.3 C) 99 F (37.2 C)   TempSrc:   Oral   SpO2: 98% 92% 100% 100%  Weight:      Height:        Intake/Output Summary (Last 24 hours) at 02/06/2023 1111 Last data filed at 02/06/2023 0937 Gross per 24 hour  Intake 1823.36 ml  Output 700 ml  Net 1123.36 ml   Filed Weights   02/05/23 1322 02/05/23 1426 02/05/23 1726  Weight: 72.6 kg 73 kg 73 kg    Examination:  General exam: Appears calm and comfortable.  Patient is pale. Respiratory system: Clear to auscultation.  Cardiovascular system: S1 & S2 heard Gastrointestinal system: Abdomen i soft and nontender.  Central nervous  system: Awake and alert.  Patient was on extremities. Extremities: Symmetric no leg edema.  Data Reviewed: I have personally reviewed following labs and imaging studies  CBC: Recent Labs  Lab 02/05/23 1335 02/06/23 0706  WBC 15.9* 15.8*  NEUTROABS 8.3* 8.0*  HGB 6.9* 6.4*  HCT 18.4* 16.5*  MCV 88.9 87.3  PLT 310 320   Basic Metabolic Panel: Recent Labs  Lab  02/05/23 1335 02/05/23 2239 02/06/23 0706  NA 134*  --  138  K 4.0  --  3.7  CL 106  --  107  CO2 22  --  21*  GLUCOSE 87  --  106*  BUN 13  --  16  CREATININE 0.84  --  0.90  CALCIUM 8.8*  --  8.8*  MG  --  2.0 2.0  PHOS  --  5.2* 5.1*   GFR: Estimated Creatinine Clearance: 109.1 mL/min (by C-G formula based on SCr of 0.9 mg/dL). Liver Function Tests: Recent Labs  Lab 02/05/23 1335 02/06/23 0706  AST 74* 64*  ALT 52* 43  ALKPHOS 110 99  BILITOT 2.3* 2.3*  PROT 8.3* 7.7  ALBUMIN 4.0 3.8   No results for input(s): "LIPASE", "AMYLASE" in the last 168 hours. No results for input(s): "AMMONIA" in the last 168 hours. Coagulation Profile: No results for input(s): "INR", "PROTIME" in the last 168 hours. Cardiac Enzymes: Recent Labs  Lab 02/05/23 2239  CKTOTAL 171   BNP (last 3 results) No results for input(s): "PROBNP" in the last 8760 hours. HbA1C: No results for input(s): "HGBA1C" in the last 72 hours. CBG: No results for input(s): "GLUCAP" in the last 168 hours. Lipid Profile: No results for input(s): "CHOL", "HDL", "LDLCALC", "TRIG", "CHOLHDL", "LDLDIRECT" in the last 72 hours. Thyroid Function Tests: Recent Labs    02/05/23 2239  TSH 3.508   Anemia Panel: Recent Labs    02/05/23 1335  RETICCTPCT 8.3*   Urine analysis:    Component Value Date/Time   COLORURINE YELLOW 02/05/2023 2239   APPEARANCEUR CLEAR 02/05/2023 2239   LABSPEC 1.026 02/05/2023 2239   PHURINE 5.0 02/05/2023 2239   GLUCOSEU NEGATIVE 02/05/2023 2239   HGBUR SMALL (A) 02/05/2023 2239   BILIRUBINUR NEGATIVE 02/05/2023 2239   KETONESUR NEGATIVE 02/05/2023 2239   PROTEINUR 30 (A) 02/05/2023 2239   UROBILINOGEN 1.0 10/27/2010 0630   NITRITE NEGATIVE 02/05/2023 2239   LEUKOCYTESUR NEGATIVE 02/05/2023 2239   Sepsis Labs: @LABRCNTIP (procalcitonin:4,lacticidven:4)  ) Recent Results (from the past 240 hour(s))  Resp panel by RT-PCR (RSV, Flu A&B, Covid) Anterior Nasal Swab     Status:  None   Collection Time: 02/05/23  4:09 PM   Specimen: Anterior Nasal Swab  Result Value Ref Range Status   SARS Coronavirus 2 by RT PCR NEGATIVE NEGATIVE Final    Comment: (NOTE) SARS-CoV-2 target nucleic acids are NOT DETECTED.  The SARS-CoV-2 RNA is generally detectable in upper respiratory specimens during the acute phase of infection. The lowest concentration of SARS-CoV-2 viral copies this assay can detect is 138 copies/mL. A negative result does not preclude SARS-Cov-2 infection and should not be used as the sole basis for treatment or other patient management decisions. A negative result may occur with  improper specimen collection/handling, submission of specimen other than nasopharyngeal swab, presence of viral mutation(s) within the areas targeted by this assay, and inadequate number of viral copies(<138 copies/mL). A negative result must be combined with clinical observations, patient history, and epidemiological information. The expected result is Negative.  Fact Sheet for  Patients:  BloggerCourse.com  Fact Sheet for Healthcare Providers:  SeriousBroker.it  This test is no t yet approved or cleared by the [place] FDA and  has been authorized for detection and/or diagnosis of SARS-CoV-2 by FDA under an Emergency Use Authorization (EUA). This EUA will remain  in effect (meaning this test can be used) for the duration of the COVID-19 declaration under Section 564(b)(1) of the Act, 21 U.S.C.section 360bbb-3(b)(1), unless the authorization is terminated  or revoked sooner.       Influenza A by PCR NEGATIVE NEGATIVE Final   Influenza B by PCR NEGATIVE NEGATIVE Final    Comment: (NOTE) The Xpert Xpress SARS-CoV-2/FLU/RSV plus assay is intended as an aid in the diagnosis of influenza from Nasopharyngeal swab specimens and should not be used as a sole basis for treatment. Nasal washings and aspirates are unacceptable  for Xpert Xpress SARS-CoV-2/FLU/RSV testing.  Fact Sheet for Patients: BloggerCourse.com  Fact Sheet for Healthcare Providers: SeriousBroker.it  This test is not yet approved or cleared by the [place] FDA and has been authorized for detection and/or diagnosis of SARS-CoV-2 by FDA under an Emergency Use Authorization (EUA). This EUA will remain in effect (meaning this test can be used) for the duration of the COVID-19 declaration under Section 564(b)(1) of the Act, 21 U.S.C. section 360bbb-3(b)(1), unless the authorization is terminated or revoked.     Resp Syncytial Virus by PCR NEGATIVE NEGATIVE Final    Comment: (NOTE) Fact Sheet for Patients: BloggerCourse.com  Fact Sheet for Healthcare Providers: SeriousBroker.it  This test is not yet approved or cleared by the [place] FDA and has been authorized for detection and/or diagnosis of SARS-CoV-2 by FDA under an Emergency Use Authorization (EUA). This EUA will remain in effect (meaning this test can be used) for the duration of the COVID-19 declaration under Section 564(b)(1) of the Act, 21 U.S.C. section 360bbb-3(b)(1), unless the authorization is terminated or revoked.  Performed at [facility], [address] [place], [place] [address]          Radiology Studies: CT Angio Chest PE W and/or Wo Contrast  Result Date: 02/05/2023 CLINICAL DATA:  Acute chest pain. Sickle cell pain. Concern for pulmonary embolism. Concern for pneumonia. EXAM: CT ANGIOGRAPHY CHEST WITH CONTRAST TECHNIQUE: Multidetector CT imaging of the chest was performed using the standard protocol during bolus administration of intravenous contrast. Multiplanar CT image reconstructions and MIPs were obtained to evaluate the vascular anatomy. RADIATION DOSE REDUCTION: This exam was performed according to the departmental  dose-optimization program which includes automated exposure control, adjustment of the mA and/or kV according to patient size and/or use of iterative reconstruction technique. CONTRAST:  75mL OMNIPAQUE IOHEXOL 350 MG/ML SOLN COMPARISON:  11/02/2010 FINDINGS: Cardiovascular: No filling defects within the pulmonary arteries to suggest acute pulmonary embolism. Streak artifact from the posterior thoracic fusion degrades imaging. Mediastinum/Nodes: No axillary or supraclavicular adenopathy. No mediastinal or hilar adenopathy. No pericardial fluid. Esophagus normal. Lungs/Pleura: There is diffuse ground-glass densities in the upper and lower lobes. No pulmonary infarction. No pneumonia. 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  No acute findings Musculoskeletal: Posterior thoracic fusion. Scoliosis no acute findings Review of the MIP images confirms the above findings. IMPRESSION: 1. No evidence acute pulmonary embolism. 2. Mild diffuse ground-glass densities in the lungs consistent with edema or atelectasis. 3. No pulmonary infarction or pneumonia. Electronically Signed   By: Genevive Bi M.D.   On: 02/05/2023 18:14   DG Chest 2 View  Result Date: 02/05/2023 CLINICAL DATA:  Shortness of breath. EXAM: CHEST - 2 VIEW COMPARISON:  Chest x-ray March 12, 2012. FINDINGS: Mild patchy opacities in both lungs. No visible pleural effusions or pneumothorax. Enlarged cardiac silhouette. Spinal fusion. IMPRESSION: 1. Mild patchy opacities in both lungs, which could represent mild edema or pneumonia. 2. Cardiomegaly. Electronically Signed   By: Feliberto Harts M.D.   On: 02/05/2023 16:10        Scheduled Meds:  enoxaparin (LOVENOX) injection  40 mg Subcutaneous Q24H   folic acid  1 mg Oral Daily   HYDROmorphone   Intravenous Q4H   hydroxyurea  1,500 mg Oral Daily   ketorolac  15 mg Intravenous Q6H   senna-docusate  1 tablet Oral BID   Continuous Infusions:  sodium chloride 125 mL/hr at  02/06/23 0416   azithromycin 500 mg (02/05/23 2320)   cefTRIAXone (ROCEPHIN)  IV     dextrose 5 % and 0.45 % NaCl 100 mL/hr at 02/06/23 0131     LOS: 1 day    Time spent: 55 minutes.    Berton Mount, MD  [facility] Pager #: [phone] 7PM-7AM contact night coverage as above

## 2023-02-07 DIAGNOSIS — D5700 Hb-SS disease with crisis, unspecified: Principal | ICD-10-CM

## 2023-02-07 LAB — RENAL FUNCTION PANEL
Albumin: 3.5 g/dL (ref 3.5–5.0)
Anion gap: 6 (ref 5–15)
BUN: 14 mg/dL (ref 6–20)
CO2: 24 mmol/L (ref 22–32)
Calcium: 8.2 mg/dL — ABNORMAL LOW (ref 8.9–10.3)
Chloride: 103 mmol/L (ref 98–111)
Creatinine, Ser: 0.77 mg/dL (ref 0.61–1.24)
GFR, Estimated: >60 mL/min (ref >60)
Glucose, Bld: 117 mg/dL — ABNORMAL HIGH (ref 70–99)
Phosphorus: 4.6 mg/dL (ref 2.5–4.6)
Potassium: 3.8 mmol/L (ref 3.5–5.1)
Sodium: 133 mmol/L — ABNORMAL LOW (ref 135–145)

## 2023-02-07 LAB — CBC WITH DIFFERENTIAL/PLATELET
Abs Immature Granulocytes: 0.12 10*3/uL — ABNORMAL HIGH (ref 0.00–0.07)
Basophils Absolute: 0.1 10*3/uL (ref 0.0–0.1)
Basophils Relative: 1 %
Eosinophils Absolute: 0.4 10*3/uL (ref 0.0–0.5)
Eosinophils Relative: 3 %
HCT: 18.9 % — ABNORMAL LOW (ref 39.0–52.0)
Hemoglobin: 7.2 g/dL — ABNORMAL LOW (ref 13.0–17.0)
Immature Granulocytes: 1 %
Lymphocytes Relative: 37 %
Lymphs Abs: 5.5 10*3/uL — ABNORMAL HIGH (ref 0.7–4.0)
MCH: 33.6 pg (ref 26.0–34.0)
MCHC: 38.1 g/dL — ABNORMAL HIGH (ref 30.0–36.0)
MCV: 88.3 fL (ref 80.0–100.0)
Monocytes Absolute: 2 10*3/uL — ABNORMAL HIGH (ref 0.1–1.0)
Monocytes Relative: 14 %
Neutro Abs: 6.5 10*3/uL (ref 1.7–7.7)
Neutrophils Relative %: 44 %
Platelets: 323 10*3/uL (ref 150–400)
RBC: 2.14 MIL/uL — ABNORMAL LOW (ref 4.22–5.81)
RDW: 24.8 % — ABNORMAL HIGH (ref 11.5–15.5)
WBC: 14.6 10*3/uL — ABNORMAL HIGH (ref 4.0–10.5)
nRBC: 5.8 % — ABNORMAL HIGH (ref 0.0–0.2)

## 2023-02-07 LAB — URINE CULTURE: Culture: NO GROWTH

## 2023-02-07 LAB — PREPARE RBC (CROSSMATCH)

## 2023-02-07 LAB — MAGNESIUM: Magnesium: 2 mg/dL (ref 1.7–2.4)

## 2023-02-07 MED ORDER — HYDROMORPHONE 1 MG/ML IV SOLN
INTRAVENOUS | Status: DC
  Administered 2023-02-07: 7 mg via INTRAVENOUS

## 2023-02-07 MED ORDER — AZITHROMYCIN 250 MG PO TABS: 500.0000 mg | ORAL_TABLET | Freq: Every day | ORAL | Status: DC

## 2023-02-07 MED ORDER — SODIUM CHLORIDE 0.9% FLUSH: 10.0000 mL | INTRAVENOUS | Status: DC | PRN

## 2023-02-07 MED ORDER — OXYCODONE HCL 5 MG PO TABS
5.0000 mg | ORAL_TABLET | Freq: Four times a day (QID) | ORAL | Status: DC | PRN
  Administered 2023-02-07 – 2023-02-08 (×6): 5 mg via ORAL
  Filled 2023-02-07 (×6): qty 1

## 2023-02-07 NOTE — Plan of Care (Signed)
  Problem: Bowel/Gastric: Goal: Gut motility will be maintained Outcome: Progressing   Problem: Tissue Perfusion: Goal: Complications related to inadequate tissue perfusion will be avoided or minimized Outcome: Progressing   Problem: Respiratory: Goal: Pulmonary complications will be avoided or minimized Outcome: Progressing   Problem: Respiratory: Goal: Acute Chest Syndrome will be identified early to prevent complications Outcome: Progressing   Problem: Sensory: Goal: Pain level will decrease with appropriate interventions Outcome: Progressing

## 2023-02-07 NOTE — Plan of Care (Signed)

## 2023-02-07 NOTE — Progress Notes (Signed)
Subjective: Manuel Mcguire is a 32 year old male with a medical history significant for sickle cell disease was admitted for sickle cell pain crisis.  Patient states that he was in his usual state of health until 2 days ago when sickle cell pain increased.  Pain is mostly to his low back and lower extremities.  Patient typically receives sickle cell care at [facility] and resides in [place]. He has infrequent pain crisis and takes oxycodone at home for pain control. He says that he has had some improvement in pain intensity overnight.  He rates pain as 7/10.  Patient is not experiencing any or respiratory symptoms.  No headache, dizziness, nausea, vomiting, or diarrhea.  Objective:  Vital signs in last 24 hours:  Vitals:   02/07/23 0410 02/07/23 0539 02/07/23 0851 02/07/23 1037  BP:  125/83  132/84  Pulse:    94  Resp: 16 14 20    Temp:  98.5 F (36.9 C)  98.7 F (37.1 C)  TempSrc:  Oral    SpO2: 93% 93% 93% 90%  Weight:      Height:        Intake/Output from previous day:   Intake/Output Summary (Last 24 hours) at 02/07/2023 1106 Last data filed at 02/07/2023 0534 Gross per 24 hour  Intake 3047.47 ml  Output 3500 ml  Net -452.53 ml    Physical Exam: General: Alert, awake, oriented x3, in no acute distress.  HEENT: [place]/AT PEERL, EOMI Neck: Trachea midline,  no masses, no thyromegal,y no JVD, no carotid bruit OROPHARYNX:  Moist, No exudate/ erythema/lesions.  Heart: Regular rate and rhythm, without murmurs, rubs, gallops, PMI non-displaced, no heaves or thrills on palpation.  Lungs: Clear to auscultation, no wheezing or rhonchi noted. No increased vocal fremitus resonant to percussion  Abdomen: Soft, nontender, nondistended, positive bowel sounds, no masses no hepatosplenomegaly noted..  Neuro: No focal neurological deficits noted cranial nerves II through XII grossly intact. DTRs 2+ bilaterally upper and lower extremities. Strength 5 out of 5 in bilateral upper and lower  extremities. Musculoskeletal: No warm swelling or erythema around joints, no spinal tenderness noted. Psychiatric: Patient alert and oriented x3, good insight and cognition, good recent to remote recall. Lymph node survey: No cervical axillary or inguinal lymphadenopathy noted.  Lab Results:  Basic Metabolic Panel:    Component Value Date/Time   NA 133 (L) 02/07/2023 0527   K 3.8 02/07/2023 0527   CL 103 02/07/2023 0527   CO2 24 02/07/2023 0527   BUN 14 02/07/2023 0527   CREATININE 0.77 02/07/2023 0527   GLUCOSE 117 (H) 02/07/2023 0527   CALCIUM 8.2 (L) 02/07/2023 0527   CBC:    Component Value Date/Time   WBC 14.6 (H) 02/07/2023 0527   HGB 7.2 (L) 02/07/2023 0527   HCT 18.9 (L) 02/07/2023 0527   PLT 323 02/07/2023 0527   MCV 88.3 02/07/2023 0527   NEUTROABS 6.5 02/07/2023 0527   LYMPHSABS 5.5 (H) 02/07/2023 0527   MONOABS 2.0 (H) 02/07/2023 0527   EOSABS 0.4 02/07/2023 0527   BASOSABS 0.1 02/07/2023 0527    Recent Results (from the past 240 hour(s))  Resp panel by RT-PCR (RSV, Flu A&B, Covid) Anterior Nasal Swab     Status: None   Collection Time: 02/05/23  4:09 PM   Specimen: Anterior Nasal Swab  Result Value Ref Range Status   SARS Coronavirus 2 by RT PCR NEGATIVE NEGATIVE Final    Comment: (NOTE) SARS-CoV-2 target nucleic acids are NOT DETECTED.  The SARS-CoV-2  RNA is generally detectable in upper respiratory specimens during the acute phase of infection. The lowest concentration of SARS-CoV-2 viral copies this assay can detect is 138 copies/mL. A negative result does not preclude SARS-Cov-2 infection and should not be used as the sole basis for treatment or other patient management decisions. A negative result may occur with  improper specimen collection/handling, submission of specimen other than nasopharyngeal swab, presence of viral mutation(s) within the areas targeted by this assay, and inadequate number of viral copies(<138 copies/mL). A negative result  must be combined with clinical observations, patient history, and epidemiological information. The expected result is Negative.  Fact Sheet for Patients:  BloggerCourse.com  Fact Sheet for Healthcare Providers:  SeriousBroker.it  This test is no t yet approved or cleared by the [place] FDA and  has been authorized for detection and/or diagnosis of SARS-CoV-2 by FDA under an Emergency Use Authorization (EUA). This EUA will remain  in effect (meaning this test can be used) for the duration of the COVID-19 declaration under Section 564(b)(1) of the Act, 21 U.S.C.section 360bbb-3(b)(1), unless the authorization is terminated  or revoked sooner.       Influenza A by PCR NEGATIVE NEGATIVE Final   Influenza B by PCR NEGATIVE NEGATIVE Final    Comment: (NOTE) The Xpert Xpress SARS-CoV-2/FLU/RSV plus assay is intended as an aid in the diagnosis of influenza from Nasopharyngeal swab specimens and should not be used as a sole basis for treatment. Nasal washings and aspirates are unacceptable for Xpert Xpress SARS-CoV-2/FLU/RSV testing.  Fact Sheet for Patients: BloggerCourse.com  Fact Sheet for Healthcare Providers: SeriousBroker.it  This test is not yet approved or cleared by the [place] FDA and has been authorized for detection and/or diagnosis of SARS-CoV-2 by FDA under an Emergency Use Authorization (EUA). This EUA will remain in effect (meaning this test can be used) for the duration of the COVID-19 declaration under Section 564(b)(1) of the Act, 21 U.S.C. section 360bbb-3(b)(1), unless the authorization is terminated or revoked.     Resp Syncytial Virus by PCR NEGATIVE NEGATIVE Final    Comment: (NOTE) Fact Sheet for Patients: BloggerCourse.com  Fact Sheet for Healthcare Providers: SeriousBroker.it  This test is  not yet approved or cleared by the [place] FDA and has been authorized for detection and/or diagnosis of SARS-CoV-2 by FDA under an Emergency Use Authorization (EUA). This EUA will remain in effect (meaning this test can be used) for the duration of the COVID-19 declaration under Section 564(b)(1) of the Act, 21 U.S.C. section 360bbb-3(b)(1), unless the authorization is terminated or revoked.  Performed at [facility], [address] [place], [place] [address]   Blood culture (routine x 2)     Status: None (Preliminary result)   Collection Time: 02/05/23 10:39 PM   Specimen: BLOOD  Result Value Ref Range Status   Specimen Description   Final    BLOOD BLOOD RIGHT ARM Performed at [facility], [address], [place], [place] [address]    Special Requests   Final    BOTTLES DRAWN AEROBIC AND ANAEROBIC Blood Culture adequate volume Performed at [facility], [address], [place], [place] [address]    Culture   Final    NO GROWTH 1 DAY Performed at [facility], [address] [place], [place] [address]    Report Status PENDING  Incomplete  Urine Culture (for pregnant, neutropenic or urologic patients or patients with an indwelling urinary catheter)  Status: None   Collection Time: 02/05/23 10:39 PM   Specimen: Urine, Clean Catch  Result Value Ref Range Status   Specimen Description   Final    URINE, CLEAN CATCH Performed at [facility], [address] [place], [place] [address]    Special Requests   Final    NONE Performed at [facility], [address], [place], [place] [address]    Culture   Final    NO GROWTH Performed at [facility], [address], [place], [place] [address]    Report Status 02/07/2023 FINAL  Final  Blood culture (routine x 2)     Status: None (Preliminary result)   Collection Time: 02/06/23  7:06 AM   Specimen: Site Not  Specified; Blood  Result Value Ref Range Status   Specimen Description   Final    SITE NOT SPECIFIED Performed at [facility], [address], [place], [place] [address]    Special Requests   Final    BOTTLES DRAWN AEROBIC AND ANAEROBIC Blood Culture results may not be optimal due to an excessive volume of blood received in culture bottles Performed at [facility], [address] [place], [place] [address]    Culture   Final    NO GROWTH < 24 HOURS Performed at [facility], [address], [place], [place] [address]    Report Status PENDING  Incomplete    Studies/Results: CT Angio Chest PE W and/or Wo Contrast  Result Date: 02/05/2023 CLINICAL DATA:  Acute chest pain. Sickle cell pain. Concern for pulmonary embolism. Concern for pneumonia. EXAM: CT ANGIOGRAPHY CHEST WITH CONTRAST TECHNIQUE: Multidetector CT imaging of the chest was performed using the standard protocol during bolus administration of intravenous contrast. Multiplanar CT image reconstructions and MIPs were obtained to evaluate the vascular anatomy. RADIATION DOSE REDUCTION: This exam was performed according to the departmental dose-optimization program which includes automated exposure control, adjustment of the mA and/or kV according to patient size and/or use of iterative reconstruction technique. CONTRAST:  75mL OMNIPAQUE IOHEXOL 350 MG/ML SOLN COMPARISON:  11/02/2010 FINDINGS: Cardiovascular: No filling defects within the pulmonary arteries to suggest acute pulmonary embolism. Streak artifact from the posterior thoracic fusion degrades imaging. Mediastinum/Nodes: No axillary or supraclavicular adenopathy. No mediastinal or hilar adenopathy. No pericardial fluid. Esophagus normal. Lungs/Pleura: There is diffuse ground-glass densities in the upper and lower lobes. No pulmonary infarction. No pneumonia. 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  No acute findings  Musculoskeletal: Posterior thoracic fusion. Scoliosis no acute findings Review of the MIP images confirms the above findings. IMPRESSION: 1. No evidence acute pulmonary embolism. 2. Mild diffuse ground-glass densities in the lungs consistent with edema or atelectasis. 3. No pulmonary infarction or pneumonia. Electronically Signed   By: Genevive Bi M.D.   On: 02/05/2023 18:14   DG Chest 2 View  Result Date: 02/05/2023 CLINICAL DATA:  Shortness of breath. EXAM: CHEST - 2 VIEW COMPARISON:  Chest x-ray March 12, 2012. FINDINGS: Mild patchy opacities in both lungs. No visible pleural effusions or pneumothorax. Enlarged cardiac silhouette. Spinal fusion. IMPRESSION: 1. Mild patchy opacities in both lungs, which could represent mild edema or pneumonia. 2. Cardiomegaly. Electronically Signed   By: Feliberto Harts M.D.   On: 02/05/2023 16:10    Medications: Scheduled Meds:  enoxaparin (LOVENOX) injection  40 mg Subcutaneous Q24H   folic acid  1 mg Oral Daily   HYDROmorphone   Intravenous Q4H  hydroxyurea  1,500 mg Oral Daily   ketorolac  15 mg Intravenous Q6H   senna-docusate  1 tablet Oral BID   Continuous Infusions:  sodium chloride 125 mL/hr at 02/07/23 0318   PRN Meds:.diphenhydrAMINE, hydrOXYzine, naloxone **AND** sodium chloride flush, ondansetron (ZOFRAN) IV, oxyCODONE, polyethylene glycol, sodium chloride flush  Consultants: none  Procedures: none  Antibiotics: none  Assessment/Plan: Principal Problem:   Sickle cell crisis (HCC) Active Problems:   Acute respiratory failure with hypoxia (HCC)   CAP (community acquired pneumonia)  Possible community-acquired pneumonia: Chest x-ray showed mild patchy opacities in both lungs that could represent mild edema or pneumonia.  Patient also underwent CTA, no evidence of pulmonary embolism, mild diffuse groundglass densities in the lungs consistent with edema or atelectasis.  No pulmonary infarction or pneumonia.  Will discontinue IV  antibiotics and monitor closely.  Maintain oxygen saturation above 90%.  Incentive spirometer. No signs of acute chest syndrome.  Sickle cell disease with pain crisis: Continue IV Dilaudid PCA at current settings Restart home medications oxycodone 5 mg every 4 hours as needed for severe breakthrough pain Decrease IV fluids to KVO Toradol 15 mg IV every 6 hours Monitor vital signs very closely, reevaluate pain scale regularly, and supplemental oxygen as needed  Anemia of chronic disease: Patient's hemoglobin is stable and consistent with his baseline.  There is no clinical indication for blood transfusion at this time.  Monitor closely.  Labs in AM.  Leukocytosis: WBCs mildly elevated.  More than likely secondary to sickle cell crisis.  Will continue to monitor closely.  Discontinue antibiotics.  Labs in AM.     Code Status: Full Code Family Communication: N/A Disposition Plan: Not yet ready for discharge  Nolon Nations  APRN, MSN, FNP-C [facility] [address] [place], [place] [address] [phone]  If 7PM-7AM, please contact night-coverage.  02/07/2023, 11:06 AM  LOS: 2 days

## 2023-02-08 LAB — LEGIONELLA PNEUMOPHILA SEROGP 1 UR AG: L. pneumophila Serogp 1 Ur Ag: NEGATIVE

## 2023-02-08 MED ORDER — HYDROMORPHONE 1 MG/ML IV SOLN
INTRAVENOUS | Status: DC
  Administered 2023-02-08: 3 mg via INTRAVENOUS
  Administered 2023-02-08: 4.5 mg via INTRAVENOUS
  Administered 2023-02-09: 1 mg via INTRAVENOUS
  Administered 2023-02-09: 30 mg via INTRAVENOUS
  Filled 2023-02-08: qty 30

## 2023-02-08 NOTE — Plan of Care (Signed)
  Problem: Education: Goal: Knowledge of vaso-occlusive preventative measures will improve Outcome: Progressing Goal: Awareness of signs and symptoms of anemia will improve Outcome: Progressing Goal: Long-term complications will improve Outcome: Progressing   Problem: Self-Care: Goal: Ability to incorporate actions that prevent/reduce pain crisis will improve Outcome: Progressing   Problem: Fluid Volume: Goal: Ability to maintain a balanced intake and output will improve Outcome: Progressing   Problem: Sensory: Goal: Pain level will decrease with appropriate interventions Outcome: Progressing

## 2023-02-08 NOTE — Progress Notes (Signed)
Subjective: Manuel Mcguire is a 32 year old male with a medical history significant for sickle cell disease was admitted for sickle cell pain crisis.  Patient states that he was in his usual state of health until 2 days ago when sickle cell pain increased.  Pain is mostly to his low back and lower extremities.  Patient typically receives sickle cell care at [facility] and resides in [place]. He has infrequent pain crisis and takes oxycodone at home for pain control.  Patient has no new complaints on today.  He continues to complain of pain primarily in his back and lower extremities.  Pain intensity 7/10. No headache, dizziness, nausea, vomiting, or diarrhea.  Objective:  Vital signs in last 24 hours:  Vitals:   02/08/23 0435 02/08/23 0502 02/08/23 0532 02/08/23 0826  BP:  114/82 115/86   Pulse:  61 71   Resp: 19 18 20 20   Temp:  98.2 F (36.8 C) (!) 97.3 F (36.3 C)   TempSrc:  Oral Oral   SpO2: 100% 98% 100% 100%  Weight:      Height:        Intake/Output from previous day:   Intake/Output Summary (Last 24 hours) at 02/08/2023 1020 Last data filed at 02/08/2023 0900 Gross per 24 hour  Intake 820 ml  Output 3850 ml  Net -3030 ml    Physical Exam: General: Alert, awake, oriented x3, in no acute distress.  HEENT: [place]/AT PEER[place], EOMI Neck: Trachea midline,  no masses, no thyromegal,y no JVD, no carotid bruit OROPHARYNX:  Moist, No exudate/ erythema/lesions.  Heart: Regular rate and rhythm, without murmurs, rubs, gallops, PMI non-displaced, no heaves or thrills on palpation.  Lungs: Clear to auscultation, no wheezing or rhonchi noted. No increased vocal fremitus resonant to percussion  Abdomen: Soft, nontender, nondistended, positive bowel sounds, no masses no hepatosplenomegaly noted..  Neuro: No focal neurological deficits noted cranial nerves II through XII grossly intact. DTRs 2+ bilaterally upper and lower extremities. Strength 5 out of 5 in bilateral upper and lower  extremities. Musculoskeletal: No warm swelling or erythema around joints, no spinal tenderness noted. Psychiatric: Patient alert and oriented x3, good insight and cognition, good recent to remote recall. Lymph node survey: No cervical axillary or inguinal lymphadenopathy noted.  Lab Results:  Basic Metabolic Panel:    Component Value Date/Time   NA 133 (L) 02/07/2023 0527   K 3.8 02/07/2023 0527   CL 103 02/07/2023 0527   CO2 24 02/07/2023 0527   BUN 14 02/07/2023 0527   CREATININE 0.77 02/07/2023 0527   GLUCOSE 117 (H) 02/07/2023 0527   CALCIUM 8.2 (L) 02/07/2023 0527   CBC:    Component Value Date/Time   WBC 14.6 (H) 02/07/2023 0527   HGB 7.2 (L) 02/07/2023 0527   HCT 18.9 (L) 02/07/2023 0527   PLT 323 02/07/2023 0527   MCV 88.3 02/07/2023 0527   NEUTROABS 6.5 02/07/2023 0527   LYMPHSABS 5.5 (H) 02/07/2023 0527   MONOABS 2.0 (H) 02/07/2023 0527   EOSABS 0.4 02/07/2023 0527   BASOSABS 0.1 02/07/2023 0527    Recent Results (from the past 240 hour(s))  Resp panel by RT-PCR (RSV, Flu A&B, Covid) Anterior Nasal Swab     Status: None   Collection Time: 02/05/23  4:09 PM   Specimen: Anterior Nasal Swab  Result Value Ref Range Status   SARS Coronavirus 2 by RT PCR NEGATIVE NEGATIVE Final    Comment: (NOTE) SARS-CoV-2 target nucleic acids are NOT DETECTED.  The SARS-CoV-2 RNA  is generally detectable in upper respiratory specimens during the acute phase of infection. The lowest concentration of SARS-CoV-2 viral copies this assay can detect is 138 copies/mL. A negative result does not preclude SARS-Cov-2 infection and should not be used as the sole basis for treatment or other patient management decisions. A negative result may occur with  improper specimen collection/handling, submission of specimen other than nasopharyngeal swab, presence of viral mutation(s) within the areas targeted by this assay, and inadequate number of viral copies(<138 copies/mL). A negative result  must be combined with clinical observations, patient history, and epidemiological information. The expected result is Negative.  Fact Sheet for Patients:  BloggerCourse.com  Fact Sheet for Healthcare Providers:  SeriousBroker.it  This test is no t yet approved or cleared by the [place] FDA and  has been authorized for detection and/or diagnosis of SARS-CoV-2 by FDA under an Emergency Use Authorization (EUA). This EUA will remain  in effect (meaning this test can be used) for the duration of the COVID-19 declaration under Section 564(b)(1) of the Act, 21 U.S.C.section 360bbb-3(b)(1), unless the authorization is terminated  or revoked sooner.       Influenza A by PCR NEGATIVE NEGATIVE Final   Influenza B by PCR NEGATIVE NEGATIVE Final    Comment: (NOTE) The Xpert Xpress SARS-CoV-2/FLU/RSV plus assay is intended as an aid in the diagnosis of influenza from Nasopharyngeal swab specimens and should not be used as a sole basis for treatment. Nasal washings and aspirates are unacceptable for Xpert Xpress SARS-CoV-2/FLU/RSV testing.  Fact Sheet for Patients: BloggerCourse.com  Fact Sheet for Healthcare Providers: SeriousBroker.it  This test is not yet approved or cleared by the [place] FDA and has been authorized for detection and/or diagnosis of SARS-CoV-2 by FDA under an Emergency Use Authorization (EUA). This EUA will remain in effect (meaning this test can be used) for the duration of the COVID-19 declaration under Section 564(b)(1) of the Act, 21 U.S.C. section 360bbb-3(b)(1), unless the authorization is terminated or revoked.     Resp Syncytial Virus by PCR NEGATIVE NEGATIVE Final    Comment: (NOTE) Fact Sheet for Patients: BloggerCourse.com  Fact Sheet for Healthcare Providers: SeriousBroker.it  This test is  not yet approved or cleared by the [place] FDA and has been authorized for detection and/or diagnosis of SARS-CoV-2 by FDA under an Emergency Use Authorization (EUA). This EUA will remain in effect (meaning this test can be used) for the duration of the COVID-19 declaration under Section 564(b)(1) of the Act, 21 U.S.C. section 360bbb-3(b)(1), unless the authorization is terminated or revoked.  Performed at [facility], [address], [place], [place] [address]   Blood culture (routine x 2)     Status: None (Preliminary result)   Collection Time: 02/05/23 10:39 PM   Specimen: BLOOD  Result Value Ref Range Status   Specimen Description   Final    BLOOD BLOOD RIGHT ARM Performed at [facility], [address], [place], [place] [address]    Special Requests   Final    BOTTLES DRAWN AEROBIC AND ANAEROBIC Blood Culture adequate volume Performed at [facility], [address], [place], [place] [address]    Culture   Final    NO GROWTH 2 DAYS Performed at [facility], [address], [place], [place] [address]    Report Status PENDING  Incomplete  Urine Culture (for pregnant, neutropenic or urologic patients or patients with an indwelling urinary catheter)  Status: None   Collection Time: 02/05/23 10:39 PM   Specimen: Urine, Clean Catch  Result Value Ref Range Status   Specimen Description   Final    URINE, CLEAN CATCH Performed at [facility], [address] [place], [place] [address]    Special Requests   Final    NONE Performed at [facility], [address] [place], [place] [address]    Culture   Final    NO GROWTH Performed at [facility], [address], [place], [place] [address]    Report Status 02/07/2023 FINAL  Final  Blood culture (routine x 2)     Status: None (Preliminary result)   Collection Time: 02/06/23  7:06 AM   Specimen: Site Not  Specified; Blood  Result Value Ref Range Status   Specimen Description   Final    SITE NOT SPECIFIED Performed at [facility], [address], [place], [place] [address]    Special Requests   Final    BOTTLES DRAWN AEROBIC AND ANAEROBIC Blood Culture results may not be optimal due to an excessive volume of blood received in culture bottles Performed at [facility], [address], [place], [place] [address]    Culture   Final    NO GROWTH 2 DAYS Performed at [facility], [address], [place], [place] [address]    Report Status PENDING  Incomplete    Studies/Results: No results found.  Medications: Scheduled Meds:  enoxaparin (LOVENOX) injection  40 mg Subcutaneous Q24H   folic acid  1 mg Oral Daily   HYDROmorphone   Intravenous Q4H   hydroxyurea  1,500 mg Oral Daily   ketorolac  15 mg Intravenous Q6H   senna-docusate  1 tablet Oral BID   Continuous Infusions:  sodium chloride 10 mL/hr at 02/07/23 1211   PRN Meds:.diphenhydrAMINE, hydrOXYzine, naloxone **AND** sodium chloride flush, ondansetron (ZOFRAN) IV, oxyCODONE, polyethylene glycol, sodium chloride flush  Consultants: none  Procedures: none  Antibiotics: none  Assessment/Plan: Principal Problem:   Sickle cell crisis (HCC) Active Problems:   Acute respiratory failure with hypoxia (HCC)   CAP (community acquired pneumonia)   Sickle cell disease with pain crisis: Weaning IV Dilaudid PCA IV fluids at [facility] Oxycodone 5 mg every 4 hours as needed for severe breakthrough pain Decrease IV fluids to KVO Toradol 15 mg IV every 6 hours Monitor vital signs very closely, reevaluate pain scale regularly, and supplemental oxygen as needed  Anemia of chronic disease: Patient's hemoglobin is stable and consistent with his baseline.  There is no clinical indication for blood transfusion at this time.  Monitor closely.  Labs in AM.  Leukocytosis: WBCs mildly elevated.   More than likely secondary to sickle cell crisis.  Will continue to monitor closely.  Discontinue antibiotics.  Labs in AM.     Code Status: Full Code Family Communication: N/A Disposition Plan: Not yet ready for discharge.  Discharge plan for 02/09/2023.  Nolon Nations  APRN, MSN, FNP-C [facility] [address] [place], [place] [address] [phone]  If 7PM-7AM, please contact night-coverage.  02/08/2023, 10:20 AM  LOS: 3 days

## 2023-02-08 NOTE — Plan of Care (Signed)

## 2023-02-09 LAB — TYPE AND SCREEN
ABO/RH(D): A POS
Antibody Screen: NEGATIVE
Unit division: 0
Unit division: 0

## 2023-02-09 LAB — CBC WITH DIFFERENTIAL/PLATELET
Abs Immature Granulocytes: 0.07 10*3/uL (ref 0.00–0.07)
Basophils Absolute: 0.1 10*3/uL (ref 0.0–0.1)
Basophils Relative: 1 %
Eosinophils Absolute: 0.6 10*3/uL — ABNORMAL HIGH (ref 0.0–0.5)
Eosinophils Relative: 5 %
HCT: 26.5 % — ABNORMAL LOW (ref 39.0–52.0)
Hemoglobin: 9.5 g/dL — ABNORMAL LOW (ref 13.0–17.0)
Immature Granulocytes: 1 %
Lymphocytes Relative: 38 %
Lymphs Abs: 4.5 10*3/uL — ABNORMAL HIGH (ref 0.7–4.0)
MCH: 32.8 pg (ref 26.0–34.0)
MCHC: 35.8 g/dL (ref 30.0–36.0)
MCV: 91.4 fL (ref 80.0–100.0)
Monocytes Absolute: 1.1 10*3/uL — ABNORMAL HIGH (ref 0.1–1.0)
Monocytes Relative: 10 %
Neutro Abs: 5.5 10*3/uL (ref 1.7–7.7)
Neutrophils Relative %: 45 %
Platelets: 379 10*3/uL (ref 150–400)
RBC: 2.9 MIL/uL — ABNORMAL LOW (ref 4.22–5.81)
RDW: 24.1 % — ABNORMAL HIGH (ref 11.5–15.5)
WBC: 11.9 10*3/uL — ABNORMAL HIGH (ref 4.0–10.5)
nRBC: 6.4 % — ABNORMAL HIGH (ref 0.0–0.2)

## 2023-02-09 LAB — BPAM RBC
Blood Product Expiration Date: 202409172359
Blood Product Expiration Date: 202410042359
ISSUE DATE / TIME: 202409020252
ISSUE DATE / TIME: 202409030226
Unit Type and Rh: 5100
Unit Type and Rh: 9500

## 2023-02-09 LAB — COMPREHENSIVE METABOLIC PANEL
ALT: 41 U/L (ref 0–44)
AST: 66 U/L — ABNORMAL HIGH (ref 15–41)
Albumin: 3.8 g/dL (ref 3.5–5.0)
Alkaline Phosphatase: 100 U/L (ref 38–126)
Anion gap: 8 (ref 5–15)
BUN: 16 mg/dL (ref 6–20)
CO2: 24 mmol/L (ref 22–32)
Calcium: 9 mg/dL (ref 8.9–10.3)
Chloride: 106 mmol/L (ref 98–111)
Creatinine, Ser: 0.83 mg/dL (ref 0.61–1.24)
GFR, Estimated: >60 mL/min (ref >60)
Glucose, Bld: 120 mg/dL — ABNORMAL HIGH (ref 70–99)
Potassium: 4.1 mmol/L (ref 3.5–5.1)
Sodium: 138 mmol/L (ref 135–145)
Total Bilirubin: 1.7 mg/dL — ABNORMAL HIGH (ref 0.3–1.2)
Total Protein: 8.3 g/dL — ABNORMAL HIGH (ref 6.5–8.1)

## 2023-02-09 NOTE — Discharge Summary (Signed)
Physician Discharge Summary  Manuel Mcguire:[id number] DOB: Jan 28, 1991 DOA: 02/05/2023  PCP: Pcp, No  Admit date: 02/05/2023  Discharge date: 02/09/2023  Discharge Diagnoses:  Principal Problem:   Sickle cell crisis (HCC) Active Problems:   Acute respiratory failure with hypoxia (HCC)   CAP (community acquired pneumonia)   Discharge Condition: Stable  Disposition:   Follow-up Information     Manuel Mcguire. Schedule an appointment as soon as possible for a visit in 3 day(s).   Why: Call [phone] to make appointment at the [facility]. Address listed above [facility] information: [address] [place] [address]               Pt is discharged home in good condition and is to follow up with Pcp, No this week to have labs evaluated. Manuel Mcguire is instructed to increase activity slowly and balance with rest for the next few days, and use prescribed medication to complete treatment of pain  Diet: Regular Wt Readings from Last 3 Encounters:  02/05/23 73 kg  10/27/12 68.5 kg  04/21/12 64.4 kg    History of present illness:  Manuel Mcguire is a 32 year old male with a medical history significant for sickle cell disease, hemoglobin SS disease presented with shortness of breath.  Patient presents with typical sickle cell pain as well that started this a.m.  Patient says that his pain was not well-controlled on his home oxycodone.  He reports a recent upper respiratory infection that he thinks triggered his pain crisis.  Patient denies any fever or current chest pain.  He denies any significant alcohol intake.  Patient reports some vaping. While in ER: Respiratory panel negative.  Chest x-ray showed mild patchy opacities in both lungs, which could represent mild edema or pneumonia and cardiomegaly. CTA chest showed no PE, mild diffuse groundglass densities in the lungs consistent with edema or  atelectasis.  Hospital Course:  Sickle cell disease with pain crisis: Patient was admitted for sickle cell pain crisis and managed appropriately with IVF, IV Dilaudid via PCA and IV Toradol, as well as other adjunct therapies per sickle cell pain management protocols. Community-acquired pneumonia: Infiltrates on chest x-ray consistent with pneumonia.  Patient was treated with IV Rocephin and azithromycin.  Upper respiratory panel was negative.  Patient will complete treatment with Augmentin.  Recommends that patient follows up with his hematology team at [facility] to repeat CBC with differential and CMP.  He says that he sees [facility] for his opiate medication management as well.  Patient was therefore discharged home today in a hemodynamically stable condition.   Manuel Mcguire will follow-up with PCP within 1 week of this discharge. [name] was counseled extensively about nonpharmacologic means of pain management, patient verbalized understanding and was appreciative of  the care received during this admission.   We discussed the need for good hydration, monitoring of hydration status, avoidance of heat, cold, stress, and infection triggers. We discussed the need to be adherent with taking Hydrea and other home medications. Patient was reminded of the need to seek medical attention immediately if any symptom of bleeding, anemia, or infection occurs.  Discharge Exam: Vitals:   02/09/23 0848 02/09/23 0901  BP:  (!) 143/74  Pulse:  65  Resp: 18 19  Temp:  98.2 F (36.8 C)  SpO2: 100% 97%   Vitals:   02/09/23 0310 02/09/23 0449 02/09/23 0848 02/09/23 0901  BP:  122/89  (!) 143/74  Pulse:  Marland Kitchen)  55  65  Resp: 14 18 18 19   Temp:  98.4 F (36.9 C)  98.2 F (36.8 C)  TempSrc:      SpO2: 96% 100% 100% 97%  Weight:      Height:        General appearance : Awake, alert, not in any distress. Speech Clear. Not toxic looking HEENT: Atraumatic and Normocephalic, pupils equally reactive to light and  accomodation Neck: Supple, no JVD. No cervical lymphadenopathy.  Chest: Good air entry bilaterally, no added sounds  CVS: S1 S2 regular, no murmurs.  Abdomen: Bowel sounds present, Non tender and not distended with no gaurding, rigidity or rebound. Extremities: B/L Lower Ext shows no edema, both legs are warm to touch Neurology: Awake alert, and oriented X 3, CN II-XII intact, Non focal Skin: No Rash  Discharge Instructions  Discharge Instructions     Discharge patient   Complete by: As directed    Discharge disposition: 01-Home or Self Care   Discharge patient date: 02/09/2023      Allergies as of 02/09/2023   No Known Allergies      Medication List     TAKE these medications    cholecalciferol 25 MCG (1000 UNIT) tablet Commonly known as: VITAMIN D3 Take 1,000 Units by mouth daily.   folic acid 1 MG tablet Commonly known as: FOLVITE Take 1 tablet (1 mg total) by mouth daily.   hydroxyurea 500 MG capsule Commonly known as: HYDREA Take 3 capsules (1,500 mg total) by mouth daily. May take with food to minimize GI side effects.   lisinopril 2.5 MG tablet Commonly known as: ZESTRIL Take 2.5 mg by mouth daily.   naproxen 250 MG tablet Commonly known as: NAPROSYN Take 250 mg by mouth 2 (two) times daily with a meal.   oxyCODONE 5 MG immediate release tablet Commonly known as: Oxy IR/ROXICODONE Take 5 mg by mouth every 6 (six) hours as needed for severe pain.   oxyCODONE-acetaminophen 5-325 MG tablet Commonly known as: PERCOCET/ROXICET Take 1 tablet by mouth every 4 (four) hours as needed for pain.        The results of significant diagnostics from this hospitalization (including imaging, microbiology, ancillary and laboratory) are listed below for reference.    Significant Diagnostic Studies: CT Angio Chest PE W and/or Wo Contrast  Result Date: 02/05/2023 CLINICAL DATA:  Acute chest pain. Sickle cell pain. Concern for pulmonary embolism. Concern for pneumonia.  EXAM: CT ANGIOGRAPHY CHEST WITH CONTRAST TECHNIQUE: Multidetector CT imaging of the chest was performed using the standard protocol during bolus administration of intravenous contrast. Multiplanar CT image reconstructions and MIPs were obtained to evaluate the vascular anatomy. RADIATION DOSE REDUCTION: This exam was performed according to the departmental dose-optimization program which includes automated exposure control, adjustment of the mA and/or kV according to patient size and/or use of iterative reconstruction technique. CONTRAST:  75mL OMNIPAQUE IOHEXOL 350 MG/ML SOLN COMPARISON:  11/02/2010 FINDINGS: Cardiovascular: No filling defects within the pulmonary arteries to suggest acute pulmonary embolism. Streak artifact from the posterior thoracic fusion degrades imaging. Mediastinum/Nodes: No axillary or supraclavicular adenopathy. No mediastinal or hilar adenopathy. No pericardial fluid. Esophagus normal. Lungs/Pleura: There is diffuse ground-glass densities in the upper and lower lobes. No pulmonary infarction. No pneumonia. 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  No acute findings Musculoskeletal: Posterior thoracic fusion. Scoliosis no acute findings Review of the MIP images confirms the above findings. IMPRESSION: 1. No evidence acute pulmonary embolism. 2. Mild diffuse ground-glass densities in  the lungs consistent with edema or atelectasis. 3. No pulmonary infarction or pneumonia. Electronically Signed   By: Genevive Bi M.D.   On: 02/05/2023 18:14   DG Chest 2 View  Result Date: 02/05/2023 CLINICAL DATA:  Shortness of breath. EXAM: CHEST - 2 VIEW COMPARISON:  Chest x-ray March 12, 2012. FINDINGS: Mild patchy opacities in both lungs. No visible pleural effusions or pneumothorax. Enlarged cardiac silhouette. Spinal fusion. IMPRESSION: 1. Mild patchy opacities in both lungs, which could represent mild edema or pneumonia. 2. Cardiomegaly. Electronically Signed   By: Feliberto Harts M.D.   On: 02/05/2023 16:10    Microbiology: Recent Results (from the past 240 hour(s))  Resp panel by RT-PCR (RSV, Flu A&B, Covid) Anterior Nasal Swab     Status: None   Collection Time: 02/05/23  4:09 PM   Specimen: Anterior Nasal Swab  Result Value Ref Range Status   SARS Coronavirus 2 by RT PCR NEGATIVE NEGATIVE Final    Comment: (NOTE) SARS-CoV-2 target nucleic acids are NOT DETECTED.  The SARS-CoV-2 RNA is generally detectable in upper respiratory specimens during the acute phase of infection. The lowest concentration of SARS-CoV-2 viral copies this assay can detect is 138 copies/mL. A negative result does not preclude SARS-Cov-2 infection and should not be used as the sole basis for treatment or other patient management decisions. A negative result may occur with  improper specimen collection/handling, submission of specimen other than nasopharyngeal swab, presence of viral mutation(s) within the areas targeted by this assay, and inadequate number of viral copies(<138 copies/mL). A negative result must be combined with clinical observations, patient history, and epidemiological information. The expected result is Negative.  Fact Sheet for Patients:  BloggerCourse.com  Fact Sheet for Healthcare Providers:  SeriousBroker.it  This test is no t yet approved or cleared by the [place] FDA and  has been authorized for detection and/or diagnosis of SARS-CoV-2 by FDA under an Emergency Use Authorization (EUA). This EUA will remain  in effect (meaning this test can be used) for the duration of the COVID-19 declaration under Section 564(b)(1) of the Act, 21 U.S.C.section 360bbb-3(b)(1), unless the authorization is terminated  or revoked sooner.       Influenza A by PCR NEGATIVE NEGATIVE Final   Influenza B by PCR NEGATIVE NEGATIVE Final    Comment: (NOTE) The Xpert Xpress SARS-CoV-2/FLU/RSV plus assay is intended  as an aid in the diagnosis of influenza from Nasopharyngeal swab specimens and should not be used as a sole basis for treatment. Nasal washings and aspirates are unacceptable for Xpert Xpress SARS-CoV-2/FLU/RSV testing.  Fact Sheet for Patients: BloggerCourse.com  Fact Sheet for Healthcare Providers: SeriousBroker.it  This test is not yet approved or cleared by the [place] FDA and has been authorized for detection and/or diagnosis of SARS-CoV-2 by FDA under an Emergency Use Authorization (EUA). This EUA will remain in effect (meaning this test can be used) for the duration of the COVID-19 declaration under Section 564(b)(1) of the Act, 21 U.S.C. section 360bbb-3(b)(1), unless the authorization is terminated or revoked.     Resp Syncytial Virus by PCR NEGATIVE NEGATIVE Final    Comment: (NOTE) Fact Sheet for Patients: BloggerCourse.com  Fact Sheet for Healthcare Providers: SeriousBroker.it  This test is not yet approved or cleared by the [place] FDA and has been authorized for detection and/or diagnosis of SARS-CoV-2 by FDA under an Emergency Use Authorization (EUA). This EUA will remain in effect (meaning this test can be used) for the duration  of the COVID-19 declaration under Section 564(b)(1) of the Act, 21 U.S.C. section 360bbb-3(b)(1), unless the authorization is terminated or revoked.  Performed at [facility], [address] [place], [place] [address]   Blood culture (routine x 2)     Status: None (Preliminary result)   Collection Time: 02/05/23 10:39 PM   Specimen: BLOOD  Result Value Ref Range Status   Specimen Description   Final    BLOOD BLOOD RIGHT ARM Performed at [facility], [address], [place], [place] [address]    Special Requests   Final    BOTTLES DRAWN AEROBIC AND ANAEROBIC Blood Culture adequate  volume Performed at [facility], [address], [place], [place] [address]    Culture   Final    NO GROWTH 3 DAYS Performed at [facility], [address], [place], [place] [address]    Report Status PENDING  Incomplete  Urine Culture (for pregnant, neutropenic or urologic patients or patients with an indwelling urinary catheter)     Status: None   Collection Time: 02/05/23 10:39 PM   Specimen: Urine, Clean Catch  Result Value Ref Range Status   Specimen Description   Final    URINE, CLEAN CATCH Performed at [facility], [address], [place], [place] [address]    Special Requests   Final    NONE Performed at [facility], [address] [place], [place] [address]    Culture   Final    NO GROWTH Performed at [facility], [address], [place], [place] [address]    Report Status 02/07/2023 FINAL  Final  Blood culture (routine x 2)     Status: None (Preliminary result)   Collection Time: 02/06/23  7:06 AM   Specimen: Site Not Specified; Blood  Result Value Ref Range Status   Specimen Description   Final    SITE NOT SPECIFIED Performed at [facility], [address], [place], [place] [address]    Special Requests   Final    BOTTLES DRAWN AEROBIC AND ANAEROBIC Blood Culture results may not be optimal due to an excessive volume of blood received in culture bottles Performed at [facility], [address], [place], [place] [address]    Culture   Final    NO GROWTH 3 DAYS Performed at [facility], [address] [place], [place] [address]    Report Status PENDING  Incomplete     Labs: Basic Metabolic Panel: Recent Labs  Lab 02/05/23 1335 02/05/23 2239 02/06/23 0706 02/07/23 0527  NA 134*  --  138 133*  K 4.0  --  3.7 3.8  CL 106  --  107 103  CO2 22  --  21* 24  GLUCOSE 87  --  106* 117*  BUN 13  --  16 14  CREATININE 0.84  --  0.90 0.77   CALCIUM 8.8*  --  8.8* 8.2*  MG  --  2.0 2.0 2.0  PHOS  --  5.2* 5.1* 4.6   Liver Function Tests: Recent Labs  Lab 02/05/23 1335 02/06/23 0706 02/07/23 0527  AST 74* 64*  --   ALT 52* 43  --   ALKPHOS 110 99  --   BILITOT 2.3* 2.3*  --   PROT 8.3* 7.7  --   ALBUMIN 4.0 3.8 3.5   No results for input(s): "LIPASE", "AMYLASE" in the last 168  hours. No results for input(s): "AMMONIA" in the last 168 hours. CBC: Recent Labs  Lab 02/05/23 1335 02/06/23 0706 02/07/23 0527  WBC 15.9* 15.8* 14.6*  NEUTROABS 8.3* 8.0* 6.5  HGB 6.9* 6.4* 7.2*  HCT 18.4* 16.5* 18.9*  MCV 88.9 87.3 88.3  PLT 310 320 323   Cardiac Enzymes: Recent Labs  Lab 02/05/23 2239  CKTOTAL 171   BNP: Invalid input(s): "POCBNP" CBG: No results for input(s): "GLUCAP" in the last 168 hours.  Time coordinating discharge: 30 minutes  Signed:  Nolon Nations  APRN, MSN, FNP-C [facility] [address] [place], [place] [address] [phone]   [facility] 02/09/2023, 10:26 AM

## 2023-02-09 NOTE — Plan of Care (Signed)

## 2023-02-11 LAB — CULTURE, BLOOD (ROUTINE X 2)
Culture: NO GROWTH
Culture: NO GROWTH
Special Requests: ADEQUATE

## 2023-03-25 ENCOUNTER — Encounter (HOSPITAL_BASED_OUTPATIENT_CLINIC_OR_DEPARTMENT_OTHER): Payer: Self-pay

## 2023-03-25 ENCOUNTER — Emergency Department (HOSPITAL_BASED_OUTPATIENT_CLINIC_OR_DEPARTMENT_OTHER)
Admission: EM | Admit: 2023-03-25 | Discharge: 2023-03-26 | Disposition: A | Discharge status: Home / Self Care | Payer: 59 | Attending: Emergency Medicine | Admitting: Emergency Medicine

## 2023-03-25 ENCOUNTER — Other Ambulatory Visit: Payer: Self-pay

## 2023-03-25 DIAGNOSIS — D5700 Hb-SS disease with crisis, unspecified: Secondary | ICD-10-CM

## 2023-03-25 DIAGNOSIS — Y92410 Unspecified street and highway as the place of occurrence of the external cause: Secondary | ICD-10-CM | POA: Diagnosis not present

## 2023-03-25 DIAGNOSIS — D57219 Sickle-cell/Hb-C disease with crisis, unspecified: Secondary | ICD-10-CM | POA: Insufficient documentation

## 2023-03-25 DIAGNOSIS — M545 Low back pain, unspecified: Secondary | ICD-10-CM | POA: Diagnosis present

## 2023-03-25 LAB — CBC WITH DIFFERENTIAL/PLATELET
Abs Immature Granulocytes: 0.02 10*3/uL (ref 0.00–0.07)
Basophils Absolute: 0.1 10*3/uL (ref 0.0–0.1)
Basophils Relative: 1 %
Eosinophils Absolute: 0.2 10*3/uL (ref 0.0–0.5)
Eosinophils Relative: 2 %
HCT: 23.1 % — ABNORMAL LOW (ref 39.0–52.0)
Hemoglobin: 8.3 g/dL — ABNORMAL LOW (ref 13.0–17.0)
Immature Granulocytes: 0 %
Lymphocytes Relative: 54 %
Lymphs Abs: 5.5 10*3/uL — ABNORMAL HIGH (ref 0.7–4.0)
MCH: 35.8 pg — ABNORMAL HIGH (ref 26.0–34.0)
MCHC: 35.9 g/dL (ref 30.0–36.0)
MCV: 99.6 fL (ref 80.0–100.0)
Monocytes Absolute: 1.5 10*3/uL — ABNORMAL HIGH (ref 0.1–1.0)
Monocytes Relative: 15 %
Neutro Abs: 2.8 10*3/uL (ref 1.7–7.7)
Neutrophils Relative %: 28 %
Platelets: 305 10*3/uL (ref 150–400)
RBC: 2.32 MIL/uL — ABNORMAL LOW (ref 4.22–5.81)
RDW: 21.8 % — ABNORMAL HIGH (ref 11.5–15.5)
Smear Review: NORMAL
WBC: 10.1 10*3/uL (ref 4.0–10.5)
nRBC: 2.5 % — ABNORMAL HIGH (ref 0.0–0.2)

## 2023-03-25 LAB — COMPREHENSIVE METABOLIC PANEL
ALT: 22 U/L (ref 0–44)
AST: 32 U/L (ref 15–41)
Albumin: 3.6 g/dL (ref 3.5–5.0)
Alkaline Phosphatase: 71 U/L (ref 38–126)
Anion gap: 8 (ref 5–15)
BUN: 10 mg/dL (ref 6–20)
CO2: 23 mmol/L (ref 22–32)
Calcium: 8.3 mg/dL — ABNORMAL LOW (ref 8.9–10.3)
Chloride: 107 mmol/L (ref 98–111)
Creatinine, Ser: 1.16 mg/dL (ref 0.61–1.24)
GFR, Estimated: >60 mL/min (ref >60)
Glucose, Bld: 130 mg/dL — ABNORMAL HIGH (ref 70–99)
Potassium: 3.4 mmol/L — ABNORMAL LOW (ref 3.5–5.1)
Sodium: 138 mmol/L (ref 135–145)
Total Bilirubin: 1.9 mg/dL — ABNORMAL HIGH (ref 0.3–1.2)
Total Protein: 7.4 g/dL (ref 6.5–8.1)

## 2023-03-25 LAB — RETICULOCYTES
Immature Retic Fract: 27.8 % — ABNORMAL HIGH (ref 2.3–15.9)
RBC.: 2.4 MIL/uL — ABNORMAL LOW (ref 4.22–5.81)
Retic Count, Absolute: 317 10*3/uL — ABNORMAL HIGH (ref 19.0–186.0)
Retic Ct Pct: 13.2 % — ABNORMAL HIGH (ref 0.4–3.1)

## 2023-03-25 LAB — PROTIME-INR
INR: 1.4 — ABNORMAL HIGH (ref 0.8–1.2)
Prothrombin Time: 17 s — ABNORMAL HIGH (ref 11.4–15.2)

## 2023-03-25 LAB — APTT: aPTT: 30 s (ref 24–36)

## 2023-03-25 MED ORDER — HYDROMORPHONE HCL 1 MG/ML IJ SOLN: 1.0000 mg | Freq: Once | INTRAMUSCULAR | Status: DC

## 2023-03-25 MED ORDER — HYDROMORPHONE HCL 1 MG/ML IJ SOLN
1.0000 mg | Freq: Once | INTRAMUSCULAR | Status: AC
  Administered 2023-03-25: 1 mg via INTRAVENOUS
  Filled 2023-03-25: qty 1

## 2023-03-25 MED ORDER — ACETAMINOPHEN 500 MG PO TABS
1000.0000 mg | ORAL_TABLET | Freq: Once | ORAL | Status: AC
  Administered 2023-03-25: 1000 mg via ORAL
  Filled 2023-03-25: qty 2

## 2023-03-25 MED ORDER — HYDROMORPHONE HCL 1 MG/ML IJ SOLN
1.0000 mg | Freq: Once | INTRAMUSCULAR | Status: AC
  Administered 2023-03-26: 1 mg via INTRAVENOUS
  Filled 2023-03-25: qty 1

## 2023-03-25 MED ORDER — KETOROLAC TROMETHAMINE 15 MG/ML IJ SOLN
15.0000 mg | Freq: Once | INTRAMUSCULAR | Status: AC
  Administered 2023-03-25: 15 mg via INTRAVENOUS
  Filled 2023-03-25: qty 1
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
## 2023-06-04 ENCOUNTER — Emergency Department (HOSPITAL_COMMUNITY): Payer: 59

## 2023-06-04 ENCOUNTER — Inpatient Hospital Stay (HOSPITAL_COMMUNITY)
Admission: EM | Admit: 2023-06-04 | Discharge: 2023-06-07 | DRG: 812 | Disposition: A | Discharge status: Home / Self Care | Payer: 59 | Attending: Internal Medicine | Admitting: Internal Medicine

## 2023-06-04 ENCOUNTER — Encounter (HOSPITAL_COMMUNITY): Payer: Self-pay

## 2023-06-04 ENCOUNTER — Other Ambulatory Visit: Payer: Self-pay

## 2023-06-04 DIAGNOSIS — I10 Essential (primary) hypertension: Secondary | ICD-10-CM | POA: Diagnosis present

## 2023-06-04 DIAGNOSIS — Z8249 Family history of ischemic heart disease and other diseases of the circulatory system: Secondary | ICD-10-CM

## 2023-06-04 DIAGNOSIS — Z833 Family history of diabetes mellitus: Secondary | ICD-10-CM | POA: Diagnosis not present

## 2023-06-04 DIAGNOSIS — Z832 Family history of diseases of the blood and blood-forming organs and certain disorders involving the immune mechanism: Secondary | ICD-10-CM

## 2023-06-04 DIAGNOSIS — Z79899 Other long term (current) drug therapy: Secondary | ICD-10-CM | POA: Diagnosis not present

## 2023-06-04 DIAGNOSIS — G894 Chronic pain syndrome: Secondary | ICD-10-CM | POA: Diagnosis present

## 2023-06-04 DIAGNOSIS — D5700 Hb-SS disease with crisis, unspecified: Secondary | ICD-10-CM | POA: Diagnosis present

## 2023-06-04 DIAGNOSIS — D638 Anemia in other chronic diseases classified elsewhere: Secondary | ICD-10-CM | POA: Diagnosis present

## 2023-06-04 DIAGNOSIS — M419 Scoliosis, unspecified: Secondary | ICD-10-CM | POA: Diagnosis present

## 2023-06-04 LAB — BASIC METABOLIC PANEL
Anion gap: 7 (ref 5–15)
BUN: 12 mg/dL (ref 6–20)
CO2: 24 mmol/L (ref 22–32)
Calcium: 8.8 mg/dL — ABNORMAL LOW (ref 8.9–10.3)
Chloride: 106 mmol/L (ref 98–111)
Creatinine, Ser: 0.74 mg/dL (ref 0.61–1.24)
GFR, Estimated: >60 mL/min (ref >60)
Glucose, Bld: 110 mg/dL — ABNORMAL HIGH (ref 70–99)
Potassium: 4.5 mmol/L (ref 3.5–5.1)
Sodium: 137 mmol/L (ref 135–145)

## 2023-06-04 LAB — URINALYSIS, ROUTINE W REFLEX MICROSCOPIC
Bacteria, UA: NONE SEEN
Bilirubin Urine: NEGATIVE
Glucose, UA: NEGATIVE mg/dL
Ketones, ur: NEGATIVE mg/dL
Leukocytes,Ua: NEGATIVE
Nitrite: NEGATIVE
Protein, ur: 30 mg/dL — AB
Specific Gravity, Urine: 1.011 (ref 1.005–1.030)
pH: 5 (ref 5.0–8.0)

## 2023-06-04 LAB — CBC
HCT: 20.7 % — ABNORMAL LOW (ref 39.0–52.0)
Hemoglobin: 8 g/dL — ABNORMAL LOW (ref 13.0–17.0)
MCH: 37 pg — ABNORMAL HIGH (ref 26.0–34.0)
MCHC: 38.6 g/dL — ABNORMAL HIGH (ref 30.0–36.0)
MCV: 95.8 fL (ref 80.0–100.0)
Platelets: 272 10*3/uL (ref 150–400)
RBC: 2.16 MIL/uL — ABNORMAL LOW (ref 4.22–5.81)
RDW: 22.2 % — ABNORMAL HIGH (ref 11.5–15.5)
WBC: 16.4 10*3/uL — ABNORMAL HIGH (ref 4.0–10.5)
nRBC: 1.8 % — ABNORMAL HIGH (ref 0.0–0.2)

## 2023-06-04 LAB — GLUCOSE, CAPILLARY: Glucose-Capillary: 92 mg/dL (ref 70–99)

## 2023-06-04 LAB — TROPONIN I (HIGH SENSITIVITY): Troponin I (High Sensitivity): 6 ng/L (ref <18)

## 2023-06-04 MED ORDER — KETOROLAC TROMETHAMINE 15 MG/ML IJ SOLN
15.0000 mg | Freq: Four times a day (QID) | INTRAMUSCULAR | Status: DC
  Administered 2023-06-04 – 2023-06-07 (×9): 15 mg via INTRAVENOUS
  Filled 2023-06-04 (×10): qty 1

## 2023-06-04 MED ORDER — ENOXAPARIN SODIUM 40 MG/0.4ML IJ SOSY
40.0000 mg | PREFILLED_SYRINGE | INTRAMUSCULAR | Status: DC
Start: 2023-06-04 — End: 2023-06-07
  Administered 2023-06-04 – 2023-06-06 (×3): 40 mg via SUBCUTANEOUS
  Filled 2023-06-04 (×3): qty 0.4

## 2023-06-04 MED ORDER — HYDROMORPHONE HCL 2 MG/ML IJ SOLN
2.0000 mg | INTRAMUSCULAR | Status: AC
  Administered 2023-06-04: 2 mg via INTRAVENOUS
  Filled 2023-06-04: qty 1

## 2023-06-04 MED ORDER — DIPHENHYDRAMINE HCL 25 MG PO CAPS
25.0000 mg | ORAL_CAPSULE | ORAL | Status: DC | PRN
Start: 2023-06-04 — End: 2023-06-05
  Administered 2023-06-04: 50 mg via ORAL
  Filled 2023-06-04: qty 2

## 2023-06-04 MED ORDER — DEXTROSE-SODIUM CHLORIDE 5-0.45 % IV SOLN: INTRAVENOUS | Status: DC

## 2023-06-04 MED ORDER — KETOROLAC TROMETHAMINE 15 MG/ML IJ SOLN
15.0000 mg | INTRAMUSCULAR | Status: AC
  Administered 2023-06-04: 15 mg via INTRAVENOUS
  Filled 2023-06-04: qty 1

## 2023-06-04 MED ORDER — LACTATED RINGERS IV SOLN: INTRAVENOUS | Status: DC

## 2023-06-04 MED ORDER — HYDROMORPHONE HCL 1 MG/ML IJ SOLN
1.0000 mg | INTRAMUSCULAR | Status: DC | PRN
  Administered 2023-06-04 – 2023-06-05 (×5): 1 mg via INTRAVENOUS
  Filled 2023-06-04 (×5): qty 1

## 2023-06-04 MED ORDER — HYDROXYUREA 500 MG PO CAPS
1500.0000 mg | ORAL_CAPSULE | Freq: Every day | ORAL | Status: DC
  Administered 2023-06-05 – 2023-06-07 (×3): 1500 mg via ORAL
  Filled 2023-06-04 (×3): qty 3

## 2023-06-04 MED ORDER — ONDANSETRON HCL 4 MG/2ML IJ SOLN
4.0000 mg | INTRAMUSCULAR | Status: DC | PRN
  Administered 2023-06-04: 4 mg via INTRAVENOUS
  Filled 2023-06-04: qty 2

## 2023-06-04 MED ORDER — LISINOPRIL 5 MG PO TABS
2.5000 mg | ORAL_TABLET | Freq: Every day | ORAL | Status: DC
  Administered 2023-06-05 – 2023-06-07 (×3): 2.5 mg via ORAL
  Filled 2023-06-04 (×3): qty 1

## 2023-06-04 NOTE — ED Provider Notes (Signed)
Emergency Department Provider Note   I have reviewed the triage vital signs and the nursing notes.   HISTORY  Chief Complaint Sickle Cell Pain Crisis and Chest Pain   HPI Manuel Mcguire is a 32 y.o. male past history of Aquacel presents emergency department with bodyaches, chest discomfort, mid back pain.  Symptoms began 3 days prior.  Has had associated emesis with some diarrhea.  No fevers.  He took his pain medication at home without improvement.   Past Medical History:  Diagnosis Date   Scoliosis    Sickle cell anemia with pain (HCC)     Review of Systems  Constitutional: No fever/chills Cardiovascular: Positive chest pain. Respiratory: Denies shortness of breath. Gastrointestinal: No abdominal pain.  Positive vomiting.  Musculoskeletal: Positive mid-back pain.  Skin: Negative for rash. Neurological: Negative for headaches, focal weakness or numbness.  ____________________________________________   PHYSICAL EXAM:  VITAL SIGNS: ED Triage Vitals  Encounter Vitals Group     BP 06/04/23 1257 (!) 129/91     Pulse Rate 06/04/23 1257 87     Resp 06/04/23 1257 20     Temp 06/04/23 1257 98.3 F (36.8 C)     Temp Source 06/04/23 1257 Oral     SpO2 06/04/23 1257 93 %     Weight 06/04/23 1302 170 lb (77.1 kg)     Height 06/04/23 1302 5\' 7"  (1.702 m)   Constitutional: Alert and oriented. Well appearing overall but slightly uncomfortable.  Eyes: Conjunctivae are normal.  Head: Atraumatic. Nose: No congestion/rhinnorhea. Mouth/Throat: Mucous membranes are moist.   Neck: No stridor.   Cardiovascular: Normal rate, regular rhythm. Good peripheral circulation. Grossly normal heart sounds.   Respiratory: Normal respiratory effort.  No retractions. Lungs CTAB. Gastrointestinal: Soft and nontender. No distention.  Musculoskeletal: No lower extremity tenderness nor edema. No gross deformities of extremities. Neurologic:  Normal speech and language.  Skin:  Skin is  warm, dry and intact. No rash noted.   ____________________________________________   LABS (all labs ordered are listed, but only abnormal results are displayed)  Labs Reviewed  BASIC METABOLIC PANEL  CBC  TROPONIN I (HIGH SENSITIVITY)   ____________________________________________  EKG   EKG Interpretation Date/Time:  Saturday June 04 2023 13:08:40 EST Ventricular Rate:  69 PR Interval:  163 QRS Duration:  86 QT Interval:  383 QTC Calculation: 411 R Axis:   54  Text Interpretation: Sinus rhythm Borderline T wave abnormalities Confirmed by Alona Bene [phone]) on 06/04/2023 1:16:45 PM        ____________________________________________  RADIOLOGY  No results found.  ____________________________________________   PROCEDURES  Procedure(s) performed:   Procedures   ____________________________________________   INITIAL IMPRESSION / ASSESSMENT AND PLAN / ED COURSE  Pertinent labs & imaging results that were available during my care of the patient were reviewed by me and considered in my medical decision making (see chart for details).   This patient is Presenting for Evaluation of CP, which does require a range of treatment options, and is a complaint that involves a high risk of morbidity and mortality.  The Differential Diagnoses includes but is not exclusive to acute coronary syndrome, aortic dissection, pulmonary embolism, cardiac tamponade, community-acquired pneumonia, pericarditis, musculoskeletal chest wall pain, etc.   Critical Interventions-    Medications  dextrose 5 % and 0.45 % NaCl infusion (has no administration in time range)  HYDROmorphone (DILAUDID) injection 2 mg (has no administration in time range)  HYDROmorphone (DILAUDID) injection 2 mg (has no administration in time  range)  HYDROmorphone (DILAUDID) injection 2 mg (has no administration in time range)  ondansetron (ZOFRAN) injection 4 mg (has no administration in time range)   diphenhydrAMINE (BENADRYL) capsule 25-50 mg (has no administration in time range)  ketorolac (TORADOL) 15 MG/ML injection 15 mg (has no administration in time range)    Reassessment after intervention:     I did obtain Additional Historical Information from family at bedside.   I decided to review pertinent External Data, and in summary no recent ED visits for similar.   Clinical Laboratory Tests Ordered, included ***  Radiologic Tests Ordered, included CXR. I independently interpreted the images and agree with radiology interpretation.   Cardiac Monitor Tracing which shows NSR.    Social Determinants of Health Risk patient is a non-smoker.   Consult complete with  Medical Decision Making: Summary:  Patient presents to the emergency department with pain typical of his sickle cell crisis.  He has mid back pain as well as chest discomfort which is described as aching.  No hypoxemia or fever on arrival.  No SIRS vitals.  Doubt sepsis.  Acute chest is a consideration.  Plan for labs including troponin and chest x-ray.   Reevaluation with update and discussion with   ***Considered admission***  Patient's presentation is most consistent with acute presentation with potential threat to life or bodily function.   Disposition:   ____________________________________________  FINAL CLINICAL IMPRESSION(S) / ED DIAGNOSES  Final diagnoses:  None     NEW OUTPATIENT MEDICATIONS STARTED DURING THIS VISIT:  New Prescriptions   No medications on file    Note:  This document was prepared using Dragon voice recognition software and may include unintentional dictation errors.  Alona Bene, MD, [facility] Emergency Medicine

## 2023-06-04 NOTE — ED Triage Notes (Signed)
Pt complaining of sickle cell pain crisis, body aches, and chest pain that all began apprx 3 days ago. Pt does endorse emesis, and diarrhea. Pt last took pain medication last night without improvement.

## 2023-06-04 NOTE — H&P (Signed)
History and Physical    Manuel Mcguire FAO:[id number] DOB: 27-May-1991 DOA: 06/04/2023  PCP: Pcp, No   Chief Complaint: sickle cell pain crisis  HPI: Manuel Mcguire is a 32 y.o. male with medical history significant of Sickle cell anemia who presented to the ED with body pain.  Patient endorses 3 days of diffuse bodyaches chest pain and back pain as well as nausea and vomiting.  Symptoms been going on for last 3 days.  He took his home pain medications without relief so he presented to the ER where he was found to be afebrile hemodynamically well.  Labs were obtained on presentation which demonstrated WBC 16.4, hemoglobin 8.0, platelets 272, troponin 6.  Patient underwent chest x-ray which demonstrated no acute findings.  Patient was given IV pain control with persistent pain so he was admitted for further management.  On evaluation he was resting comfortably.  He was asleep and somnolent however was endorsing pain on arousal.  He denies any infectious complaints including fevers or chills   Review of Systems: Review of Systems  Constitutional:  Negative for chills and fever.  HENT: Negative.    Eyes: Negative.   Respiratory: Negative.    Cardiovascular: Negative.   Gastrointestinal: Negative.   Genitourinary: Negative.   Musculoskeletal: Negative.   Skin: Negative.   Endo/Heme/Allergies: Negative.   Psychiatric/Behavioral: Negative.    All other systems reviewed and are negative.    As per HPI otherwise 10 point review of systems negative.   No Known Allergies  Past Medical History:  Diagnosis Date   Scoliosis    Sickle cell anemia with pain (HCC)     History reviewed. No pertinent surgical history.   reports that he has never smoked. He does not have any smokeless tobacco history on file. He reports that he does not drink alcohol and does not use drugs.  Family History  Problem Relation Age of Onset   Diabetes Mother    Hypertension Mother    Sickle cell anemia  Sister     Prior to Admission medications   Medication Sig Start Date End Date Taking? Authorizing Provider  cholecalciferol (VITAMIN D3) 25 MCG (1000 UNIT) tablet Take 1,000 Units by mouth daily.    [provider]  folic acid (FOLVITE) 1 MG tablet Take 1 tablet (1 mg total) by mouth daily. 10/27/12   Altha Harm, MD  hydroxyurea (HYDREA) 500 MG capsule Take 3 capsules (1,500 mg total) by mouth daily. May take with food to minimize GI side effects. 10/27/12   Altha Harm, MD  lisinopril (ZESTRIL) 2.5 MG tablet Take 2.5 mg by mouth daily.    [provider]  naproxen (NAPROSYN) 250 MG tablet Take 250 mg by mouth 2 (two) times daily with a meal.    [provider]  oxyCODONE (OXY IR/ROXICODONE) 5 MG immediate release tablet Take 5 mg by mouth every 6 (six) hours as needed for severe pain.    [provider]    Physical Exam: Vitals:   06/04/23 1257 06/04/23 1302 06/04/23 1611  BP: (!) 129/91  135/84  Pulse: 87  84  Resp: 20  18  Temp: 98.3 F (36.8 C)  98.4 F (36.9 C)  TempSrc: Oral  Oral  SpO2: 93%  93%  Weight:  77.1 kg   Height:  5\' 7"  (1.702 m)    Physical Exam Constitutional:      Appearance: He is normal weight.  HENT:     Head: Normocephalic.  Cardiovascular:     Rate and Rhythm: Normal rate and regular rhythm.     Heart sounds: Normal heart sounds.  Pulmonary:     Effort: Pulmonary effort is normal.     Breath sounds: Normal breath sounds.  Abdominal:     General: Bowel sounds are normal.     Palpations: Abdomen is soft.  Musculoskeletal:        General: Normal range of motion.     Cervical back: Normal range of motion.  Skin:    General: Skin is warm.     Capillary Refill: Capillary refill takes less than 2 seconds.  Neurological:     General: No focal deficit present.     Mental Status: He is alert.        Labs on Admission: I have personally reviewed the patients's labs and imaging  studies.  Assessment/Plan Principal Problem:   Sickle cell crisis (HCC)   # Sickle pain crisis - Patient presenting with diffuse body aches and pain - No obvious signs of infection however patient has leukocytosis - No evidence of acute chest crisis  Plan Tau Continue IV fluids Check GI pathogen panel due to reported diarrhea FU urinary studies Continue hydrea Prn dilaudid  #HTN- continue lisinopril  #N/V/D- PRN zofran check GI panel  Admission status: Inpatient Med-Surg  Certification: The appropriate patient status for this patient is INPATIENT. Inpatient status is judged to be reasonable and necessary in order to provide the required intensity of service to ensure the patient's safety. The patient's presenting symptoms, physical exam findings, and initial radiographic and laboratory data in the context of their chronic comorbidities is felt to place them at high risk for further clinical deterioration. Furthermore, it is not anticipated that the patient will be medically stable for discharge from the hospital within 2 midnights of admission.   * I certify that at the point of admission it is my clinical judgment that the patient will require inpatient hospital care spanning beyond 2 midnights from the point of admission due to high intensity of service, high risk for further deterioration and high frequency of surveillance required.Alan Mulder MD [facility] If 7PM-7AM, please contact night-coverage [email or web address]  06/04/2023, 5:54 PM

## 2023-06-04 NOTE — ED Notes (Signed)
Attempted IV access x 2 without success.

## 2023-06-04 NOTE — ED Provider Notes (Signed)
Patient signed out to me at 1530 by Dr. Jacqulyn Bath pending reassessment.  In short this is a 32 year old male with a past medical history of sickle cell anemia that presented to the emergency department with chest pain and bodyaches for the last few days that feels consistent with sickle cell pain crisis.  Patient had workup performed that showed no signs of acute chest or aplastic crisis, hemoglobin at baseline.  Patient was given 3 doses of Dilaudid and Toradol.  The patient was asleep on my evaluation however upon waking the patient reports that his pain is only mildly improved and does not feel controlled enough yet to go home.  Plan will be for admission for further pain management.   Rexford Maus, DO 06/04/23 1702

## 2023-06-05 LAB — CBC WITH DIFFERENTIAL/PLATELET
Abs Immature Granulocytes: 0.08 10*3/uL — ABNORMAL HIGH (ref 0.00–0.07)
Basophils Absolute: 0.1 10*3/uL (ref 0.0–0.1)
Basophils Relative: 0 %
Eosinophils Absolute: 0.3 10*3/uL (ref 0.0–0.5)
Eosinophils Relative: 2 %
HCT: 19.5 % — ABNORMAL LOW (ref 39.0–52.0)
Hemoglobin: 7.7 g/dL — ABNORMAL LOW (ref 13.0–17.0)
Immature Granulocytes: 1 %
Lymphocytes Relative: 38 %
Lymphs Abs: 6.1 10*3/uL — ABNORMAL HIGH (ref 0.7–4.0)
MCH: 37.4 pg — ABNORMAL HIGH (ref 26.0–34.0)
MCHC: 39.5 g/dL — ABNORMAL HIGH (ref 30.0–36.0)
MCV: 94.7 fL (ref 80.0–100.0)
Monocytes Absolute: 2.2 10*3/uL — ABNORMAL HIGH (ref 0.1–1.0)
Monocytes Relative: 14 %
Neutro Abs: 7.4 10*3/uL (ref 1.7–7.7)
Neutrophils Relative %: 45 %
Platelets: 270 10*3/uL (ref 150–400)
RBC: 2.06 MIL/uL — ABNORMAL LOW (ref 4.22–5.81)
RDW: 22.2 % — ABNORMAL HIGH (ref 11.5–15.5)
WBC: 16.1 10*3/uL — ABNORMAL HIGH (ref 4.0–10.5)
nRBC: 1.7 % — ABNORMAL HIGH (ref 0.0–0.2)

## 2023-06-05 LAB — BASIC METABOLIC PANEL
Anion gap: 8 (ref 5–15)
BUN: 16 mg/dL (ref 6–20)
CO2: 23 mmol/L (ref 22–32)
Calcium: 8.7 mg/dL — ABNORMAL LOW (ref 8.9–10.3)
Chloride: 108 mmol/L (ref 98–111)
Creatinine, Ser: 0.89 mg/dL (ref 0.61–1.24)
GFR, Estimated: >60 mL/min (ref >60)
Glucose, Bld: 105 mg/dL — ABNORMAL HIGH (ref 70–99)
Potassium: 4.5 mmol/L (ref 3.5–5.1)
Sodium: 139 mmol/L (ref 135–145)

## 2023-06-05 LAB — RETICULOCYTES
Immature Retic Fract: 31.8 % — ABNORMAL HIGH (ref 2.3–15.9)
RBC.: 2.09 MIL/uL — ABNORMAL LOW (ref 4.22–5.81)
Retic Count, Absolute: 98 10*3/uL (ref 19.0–186.0)
Retic Ct Pct: 4.7 % — ABNORMAL HIGH (ref 0.4–3.1)

## 2023-06-05 MED ORDER — HYDROMORPHONE 1 MG/ML IV SOLN
INTRAVENOUS | Status: DC
  Administered 2023-06-05: 3 mL via INTRAVENOUS
  Administered 2023-06-05: 30 mg via INTRAVENOUS
  Administered 2023-06-05 (×2): 3.5 mg via INTRAVENOUS
  Administered 2023-06-06: 4 mL via INTRAVENOUS
  Administered 2023-06-06: 2 mg via INTRAVENOUS
  Administered 2023-06-06: 2.5 mg via INTRAVENOUS
  Administered 2023-06-06: 4.5 mL via INTRAVENOUS
  Administered 2023-06-06: 4.5 mg via INTRAVENOUS
  Administered 2023-06-06: 30 mg via INTRAVENOUS
  Administered 2023-06-07: 2 mg via INTRAVENOUS
  Administered 2023-06-07: 1.5 mg via INTRAVENOUS
  Administered 2023-06-07: 7 mL via INTRAVENOUS
  Filled 2023-06-05 (×2): qty 30

## 2023-06-05 MED ORDER — HYDROMORPHONE HCL 1 MG/ML IJ SOLN: 1.0000 mg | Freq: Once | INTRAMUSCULAR | Status: DC

## 2023-06-05 MED ORDER — NALOXONE HCL 0.4 MG/ML IJ SOLN: 0.4000 mg | INTRAMUSCULAR | Status: DC | PRN

## 2023-06-05 MED ORDER — DIPHENHYDRAMINE HCL 25 MG PO CAPS: 25.0000 mg | ORAL_CAPSULE | ORAL | Status: DC | PRN

## 2023-06-05 MED ORDER — SODIUM CHLORIDE 0.9% FLUSH: 9.0000 mL | INTRAVENOUS | Status: DC | PRN

## 2023-06-05 NOTE — Plan of Care (Signed)
°  Problem: Education: Goal: Knowledge of General Education information will improve Description: Including pain rating scale, medication(s)/side effects and non-pharmacologic comfort measures Outcome: Progressing   Problem: Education: Goal: Knowledge of vaso-occlusive preventative measures will improve Outcome: Progressing Goal: Awareness of infection prevention will improve Outcome: Progressing Goal: Awareness of signs and symptoms of anemia will improve Outcome: Progressing Goal: Long-term complications will improve Outcome: Progressing

## 2023-06-06 DIAGNOSIS — D5700 Hb-SS disease with crisis, unspecified: Secondary | ICD-10-CM | POA: Diagnosis not present

## 2023-06-06 LAB — CBC
HCT: 21 % — ABNORMAL LOW (ref 39.0–52.0)
Hemoglobin: 7.7 g/dL — ABNORMAL LOW (ref 13.0–17.0)
MCH: 35.5 pg — ABNORMAL HIGH (ref 26.0–34.0)
MCHC: 36.7 g/dL — ABNORMAL HIGH (ref 30.0–36.0)
MCV: 96.8 fL (ref 80.0–100.0)
Platelets: 254 10*3/uL (ref 150–400)
RBC: 2.17 MIL/uL — ABNORMAL LOW (ref 4.22–5.81)
RDW: 21.7 % — ABNORMAL HIGH (ref 11.5–15.5)
WBC: 19.5 10*3/uL — ABNORMAL HIGH (ref 4.0–10.5)
nRBC: 1.8 % — ABNORMAL HIGH (ref 0.0–0.2)

## 2023-06-06 MED ORDER — OXYCODONE HCL 5 MG PO TABS: 5.0000 mg | ORAL_TABLET | Freq: Four times a day (QID) | ORAL | Status: DC | PRN

## 2023-06-06 NOTE — TOC Progression Note (Signed)
Transition of Care [facility]) - Progression Note    Patient Details  Name: Manuel Mcguire MRN: [id number] Date of Birth: 03-29-1991  [facility] CM/SW Contact  Geni Bers, RN Phone Number: 06/06/2023, 12:26 PM  Clinical Narrative:    Pt Care Center telephone number placed in AVS for pt to call for follow up appointment with a PCP.         Expected Discharge Plan and Services                                               Social Determinants of Health (SDOH) Interventions SDOH Screenings   Food Insecurity: No Food Insecurity (02/06/2023)  Housing: Low Risk  (02/06/2023)  Transportation Needs: No Transportation Needs (02/06/2023)  Utilities: Not At Risk (02/06/2023)  Tobacco Use: Unknown (06/04/2023)  Recent Concern: Tobacco Use - High Risk (04/28/2023)   Received from [facility]    Readmission Risk Interventions     No data to display

## 2023-06-06 NOTE — Progress Notes (Signed)
Pt had a spontaneous nose bleed this morning, stopped shortly after occurring. Vitals were obtained and wnl. Pt informed to notify staff if any dizziness followed. Bed alarm activated, call light within reach.

## 2023-06-06 NOTE — Progress Notes (Addendum)
PT removed RUE IV during shift change. Educated PT not to pull at IV so that he can receive PCA medication. Instructed PT to get out of bed on side of IV pole to avoid pulling at IV tubes. PT proceeded to get out of bed opposite IV pole pulling at lines. PT noncompliant and said "this is what I do at home".  LUE IV wrapped in coban to avoid accidental removal. PT asked to speak about what the problem was. I re-educated about safety protocols.

## 2023-06-06 NOTE — Progress Notes (Signed)
Patient ID: Manuel Mcguire, male   DOB: September 28, 1990, 32 y.o.   MRN: [id number] Subjective: Manuel Mcguire is a 32 year old male with a medical history significant for sickle cell disease was admitted for sickle cell pain crisis.  Today, patient said he is feeling much of discomfort, like his whole body feels like he has done a lot of physical activities/exercise.  His pain is at about a 6/10 as against his goal of 3/10.  He is finding it difficult to find a position of rest.  He however denies any fever, cough, chest pain, shortness of breath, nausea, vomiting or diarrhea.  No headache.  No urinary symptoms.  Objective:  Vital signs in last 24 hours:  Vitals:   06/06/23 1235 06/06/23 1403 06/06/23 1740 06/06/23 1743  BP:  131/83  127/85  Pulse:  84  73  Resp: 15 16 15 18   Temp:  98.6 F (37 C)  98.3 F (36.8 C)  TempSrc:  Oral  Oral  SpO2: 99% 94% 94% 96%  Weight:      Height:        Intake/Output from previous day:   Intake/Output Summary (Last 24 hours) at 06/06/2023 1938 Last data filed at 06/06/2023 1000 Gross per 24 hour  Intake 480 ml  Output 1750 ml  Net -1270 ml    Physical Exam: General: Alert, awake, oriented x3, in no acute distress.  HEENT: [place]/AT PEERL, EOMI Neck: Trachea midline,  no masses, no thyromegal,y no JVD, no carotid bruit OROPHARYNX:  Moist, No exudate/ erythema/lesions.  Heart: Regular rate and rhythm, without murmurs, rubs, gallops, PMI non-displaced, no heaves or thrills on palpation.  Lungs: Clear to auscultation, no wheezing or rhonchi noted. No increased vocal fremitus resonant to percussion  Abdomen: Soft, nontender, nondistended, positive bowel sounds, no masses no hepatosplenomegaly noted..  Neuro: No focal neurological deficits noted cranial nerves II through XII grossly intact. DTRs 2+ bilaterally upper and lower extremities. Strength 5 out of 5 in bilateral upper and lower extremities. Musculoskeletal: No warm swelling or erythema around  joints, no spinal tenderness noted. Psychiatric: Patient alert and oriented x3, good insight and cognition, good recent to remote recall. Lymph node survey: No cervical axillary or inguinal lymphadenopathy noted.  Lab Results:  Basic Metabolic Panel:    Component Value Date/Time   NA 139 06/05/2023 0526   K 4.5 06/05/2023 0526   CL 108 06/05/2023 0526   CO2 23 06/05/2023 0526   BUN 16 06/05/2023 0526   CREATININE 0.89 06/05/2023 0526   GLUCOSE 105 (H) 06/05/2023 0526   CALCIUM 8.7 (L) 06/05/2023 0526   CBC:    Component Value Date/Time   WBC 19.5 (H) 06/06/2023 0515   HGB 7.7 (L) 06/06/2023 0515   HCT 21.0 (L) 06/06/2023 0515   PLT 254 06/06/2023 0515   MCV 96.8 06/06/2023 0515   NEUTROABS 7.4 06/05/2023 0526   LYMPHSABS 6.1 (H) 06/05/2023 0526   MONOABS 2.2 (H) 06/05/2023 0526   EOSABS 0.3 06/05/2023 0526   BASOSABS 0.1 06/05/2023 0526    No results found for this or any previous visit (from the past 240 hours).  Studies/Results: No results found.  Medications: Scheduled Meds:  enoxaparin (LOVENOX) injection  40 mg Subcutaneous Q24H   HYDROmorphone   Intravenous Q4H    HYDROmorphone (DILAUDID) injection  1 mg Intravenous Once   hydroxyurea  1,500 mg Oral Daily   ketorolac  15 mg Intravenous Q6H   lisinopril  2.5 mg Oral Daily  Continuous Infusions: PRN Meds:.diphenhydrAMINE, naloxone **AND** sodium chloride flush, ondansetron  Consultants: None  Procedures: None  Antibiotics: None  Assessment/Plan: Principal Problem:   Sickle cell crisis (HCC)  Hb Sickle Cell Disease with Pain crisis: Reduce IVF to [facility]. Continue weight based Dilaudid PCA at current dose setting, continue IV Toradol 15 mg Q 6 H, continue oral home pain medications as ordered.  Monitor vitals very closely, Re-evaluate pain scale regularly, 2 L of Oxygen by [place]. Leukocytosis: This is chronic, most likely reactive.  There is no sign or symptoms of infection or inflammation.  Will monitor  very closely without antibiotics at this time. Anemia of Chronic Disease: Hemoglobin is stable at baseline today.  There is no indication for blood transfusion at this time.  Will continue to monitor very closely and transfuse as appropriate.  Repeat labs in AM. Chronic pain Syndrome: Continue home pain medication as ordered.  Code Status: Full Code Family Communication: N/A Disposition Plan: Not yet ready for discharge  Manuel Mcguire  If 7PM-7AM, please contact night-coverage.  06/06/2023, 7:38 PM  LOS: 2 days

## 2023-06-06 NOTE — Plan of Care (Signed)
  Problem: Education: Goal: Knowledge of General Education information will improve Description: Including pain rating scale, medication(s)/side effects and non-pharmacologic comfort measures Outcome: Progressing   Problem: Clinical Measurements: Goal: Ability to maintain clinical measurements within normal limits will improve Outcome: Progressing   Problem: Activity: Goal: Risk for activity intolerance will decrease Outcome: Progressing   Problem: Elimination: Goal: Will not experience complications related to bowel motility Outcome: Progressing   Problem: Pain Management: Goal: General experience of comfort will improve Outcome: Progressing   Problem: Safety: Goal: Ability to remain free from injury will improve Outcome: Progressing

## 2023-06-06 NOTE — Progress Notes (Signed)
   06/06/23 1223  TOC Brief Assessment  Insurance and Status Reviewed  Patient has primary care physician No  Home environment has been reviewed Private  Prior level of function: Independent  Prior/Current Home Services No current home services  Social Drivers of Health Review SDOH reviewed no interventions necessary  Readmission risk has been reviewed Yes  Transition of care needs no transition of care needs at this time

## 2023-06-07 DIAGNOSIS — D5700 Hb-SS disease with crisis, unspecified: Secondary | ICD-10-CM | POA: Diagnosis not present

## 2023-06-07 LAB — COMPREHENSIVE METABOLIC PANEL
ALT: 39 U/L (ref 0–44)
AST: 88 U/L — ABNORMAL HIGH (ref 15–41)
Albumin: 4 g/dL (ref 3.5–5.0)
Alkaline Phosphatase: 103 U/L (ref 38–126)
Anion gap: 7 (ref 5–15)
BUN: 14 mg/dL (ref 6–20)
CO2: 25 mmol/L (ref 22–32)
Calcium: 8.5 mg/dL — ABNORMAL LOW (ref 8.9–10.3)
Chloride: 101 mmol/L (ref 98–111)
Creatinine, Ser: 0.75 mg/dL (ref 0.61–1.24)
GFR, Estimated: >60 mL/min (ref >60)
Glucose, Bld: 104 mg/dL — ABNORMAL HIGH (ref 70–99)
Potassium: 3.7 mmol/L (ref 3.5–5.1)
Sodium: 133 mmol/L — ABNORMAL LOW (ref 135–145)
Total Bilirubin: 3.6 mg/dL — ABNORMAL HIGH (ref 0.0–1.2)
Total Protein: 8.2 g/dL — ABNORMAL HIGH (ref 6.5–8.1)

## 2023-06-07 LAB — CBC
HCT: 20.3 % — ABNORMAL LOW (ref 39.0–52.0)
Hemoglobin: 7.5 g/dL — ABNORMAL LOW (ref 13.0–17.0)
MCH: 35.5 pg — ABNORMAL HIGH (ref 26.0–34.0)
MCHC: 36.9 g/dL — ABNORMAL HIGH (ref 30.0–36.0)
MCV: 96.2 fL (ref 80.0–100.0)
Platelets: 234 10*3/uL (ref 150–400)
RBC: 2.11 MIL/uL — ABNORMAL LOW (ref 4.22–5.81)
RDW: 21 % — ABNORMAL HIGH (ref 11.5–15.5)
WBC: 17.8 10*3/uL — ABNORMAL HIGH (ref 4.0–10.5)
nRBC: 2.5 % — ABNORMAL HIGH (ref 0.0–0.2)

## 2023-06-07 NOTE — Discharge Summary (Signed)
 Physician Discharge Summary  Manuel Mcguire FMW:[id number] DOB: 05/15/1991 DOA: 06/04/2023  PCP: Pcp, No  Admit date: 06/04/2023  Discharge date: 06/07/2023  Discharge Diagnoses:  Principal Problem:   Sickle cell pain crisis (HCC) Active Problems:   Sickle cell crisis [facility])   Discharge Condition: Stable  Disposition:   Follow-up Information     [facility] Follow up.   Specialty: Internal Medicine Why: Please call to make a follow up appointment Contact information: [address] Manuel Mcguire [place]  [address] [phone]               Pt is discharged home in good condition and is to follow up with his PCP at [facility] this week to have labs evaluated. Manuel Mcguire is instructed to increase activity slowly and balance with rest for the next few days, and use prescribed medication to complete treatment of pain  Diet: Regular Wt Readings from Last 3 Encounters:  06/04/23 77.1 kg  03/25/23 72.6 kg  02/05/23 73 kg    History of present illness:  Manuel Mcguire is a 32 y.o. male with medical history significant of Sickle cell anemia who presented to the ED with body pain.  Patient endorses 3 days of diffuse bodyaches chest pain and back pain as well as nausea and vomiting.  Symptoms been going on for last 3 days.  He took his home pain medications without relief so he presented to the ER where he was found to be afebrile hemodynamically well.  Labs were obtained on presentation which demonstrated WBC 16.4, hemoglobin 8.0, platelets 272, troponin 6.  Patient underwent chest x-ray which demonstrated no acute findings.  Patient was given IV pain control with persistent pain so he was admitted for further management.  On evaluation he was resting comfortably.  He was asleep and somnolent however was endorsing pain on arousal.  He denies any infectious complaints including fevers or chills   Hospital Course:  Patient was admitted for  sickle cell pain crisis and managed appropriately with IVF, IV Dilaudid  via PCA and IV Toradol , as well as other adjunct therapies per sickle cell pain management protocols. Patient slowly responded to the regimen. Hemoglobin remained stable at baseline, patient did not require any blood transfusion. As at today, patient is tolerating PO intake well, and ambulating well with no significant pain. He said his pain is now at baseline and he would like to go home since his pain can now be managed with his home medications. Patient was therefore discharged home today in a hemodynamically stable condition.   [name] will follow-up with PCP within 1 week of this discharge. Manuel Mcguire was counseled extensively about nonpharmacologic means of pain management, patient verbalized understanding and was appreciative of  the care received during this admission.   We discussed the need for good hydration, monitoring of hydration status, avoidance of heat, cold, stress, and infection triggers. We discussed the need to be adherent with taking Hydrea  and other home medications. Patient was reminded of the need to seek medical attention immediately if any symptom of bleeding, anemia, or infection occurs.  Discharge Exam: Vitals:   06/07/23 0721 06/07/23 1053  BP:  (!) 145/92  Pulse:  85  Resp: 19 18  Temp:  98.1 F (36.7 C)  SpO2: 100% 98%   Vitals:   06/07/23 0500 06/07/23 0543 06/07/23 0721 06/07/23 1053  BP:  137/80  (!) 145/92  Pulse:  94  85  Resp: 14 17  19 18  Temp:  98.1 F (36.7 C)  98.1 F (36.7 C)  TempSrc:  Oral  Oral  SpO2: 96% 93% 100% 98%  Weight:      Height:       General appearance : Awake, alert, not in any distress. Speech Clear. Not toxic looking HEENT: Atraumatic and Normocephalic, pupils equally reactive to light and accomodation Neck: Supple, no JVD. No cervical lymphadenopathy.  Chest: Good air entry bilaterally, no added sounds  CVS: S1 S2 regular, no murmurs.  Abdomen: Bowel  sounds present, Non tender and not distended with no gaurding, rigidity or rebound. Extremities: B/L Lower Ext shows no edema, both legs are warm to touch Neurology: Awake alert, and oriented X 3, CN II-XII intact, Non focal Skin: No Rash  Discharge Instructions  Discharge Instructions     Diet - low sodium heart healthy   Complete by: As directed    Increase activity slowly   Complete by: As directed       Allergies as of 06/07/2023   No Known Allergies      Medication List     TAKE these medications    cholecalciferol  25 MCG (1000 UNIT) tablet Commonly known as: VITAMIN D3 Take 1,000 Units by mouth daily.   folic acid  1 MG tablet Commonly known as: FOLVITE  Take 1 tablet (1 mg total) by mouth daily.   hydroxyurea  500 MG capsule Commonly known as: HYDREA  Take 3 capsules (1,500 mg total) by mouth daily. May take with food to minimize GI side effects.   lisinopril  2.5 MG tablet Commonly known as: ZESTRIL  Take 2.5 mg by mouth daily.   naproxen 250 MG tablet Commonly known as: NAPROSYN Take 250 mg by mouth 2 (two) times daily with a meal.   oxyCODONE  5 MG immediate release tablet Commonly known as: Oxy IR/ROXICODONE  Take 5 mg by mouth every 6 (six) hours as needed for severe pain.        The results of significant diagnostics from this hospitalization (including imaging, microbiology, ancillary and laboratory) are listed below for reference.    Significant Diagnostic Studies: DG Chest 2 View Result Date: 06/04/2023 CLINICAL DATA:  Chest pain for 3 days.  Sickle cell disease. EXAM: CHEST - 2 VIEW COMPARISON:  02/05/2023 FINDINGS: The heart size and mediastinal contours are within normal limits. Mild bibasilar scarring is stable. No No evidence of acute infiltrate or pleural effusion. Thoracic posterior spinal fixation rods are again seen. IMPRESSION: Stable mild bibasilar scarring. No active cardiopulmonary disease. Electronically Signed   By: Norleen DELENA Kil M.D.    On: 06/04/2023 14:23    Microbiology: No results found for this or any previous visit (from the past 240 hours).   Labs: Basic Metabolic Panel: Recent Labs  Lab 06/04/23 1400 06/05/23 0526 06/07/23 0538  NA 137 139 133*  K 4.5 4.5 3.7  CL 106 108 101  CO2 24 23 25   GLUCOSE 110* 105* 104*  BUN 12 16 14   CREATININE 0.74 0.89 0.75  CALCIUM 8.8* 8.7* 8.5*   Liver Function Tests: Recent Labs  Lab 06/07/23 0538  AST 88*  ALT 39  ALKPHOS 103  BILITOT 3.6*  PROT 8.2*  ALBUMIN 4.0   No results for input(s): LIPASE, AMYLASE in the last 168 hours. No results for input(s): AMMONIA in the last 168 hours. CBC: Recent Labs  Lab 06/04/23 1400 06/05/23 0526 06/06/23 0515 06/07/23 0538  WBC 16.4* 16.1* 19.5* 17.8*  NEUTROABS  --  7.4  --   --  HGB 8.0* 7.7* 7.7* 7.5*  HCT 20.7* 19.5* 21.0* 20.3*  MCV 95.8 94.7 96.8 96.2  PLT 272 270 254 234   Cardiac Enzymes: No results for input(s): CKTOTAL, CKMB, CKMBINDEX, TROPONINI in the last 168 hours. BNP: Invalid input(s): POCBNP CBG: Recent Labs  Lab 06/04/23 2046  GLUCAP 92    Time coordinating discharge: 50 minutes  Signed:  Eveline Sauve  [facility] 06/07/2023, 10:55 AM

## 2023-06-07 NOTE — Plan of Care (Signed)
  Problem: Clinical Measurements: Goal: Ability to maintain clinical measurements within normal limits will improve Outcome: Progressing   Problem: Nutrition: Goal: Adequate nutrition will be maintained Outcome: Progressing   Problem: Elimination: Goal: Will not experience complications related to bowel motility Outcome: Progressing   Problem: Pain Management: Goal: General experience of comfort will improve Outcome: Progressing   Problem: Safety: Goal: Ability to remain free from injury will improve Outcome: Progressing

## 2023-08-26 ENCOUNTER — Encounter (HOSPITAL_COMMUNITY): Payer: Self-pay

## 2023-08-26 ENCOUNTER — Other Ambulatory Visit: Payer: Self-pay

## 2023-08-26 ENCOUNTER — Inpatient Hospital Stay (HOSPITAL_COMMUNITY)
Admission: EM | Admit: 2023-08-26 | Discharge: 2023-08-30 | DRG: 812 | Disposition: A | Discharge status: Home / Self Care | Payer: MEDICAID | Attending: Internal Medicine | Admitting: Internal Medicine

## 2023-08-26 DIAGNOSIS — G894 Chronic pain syndrome: Secondary | ICD-10-CM | POA: Diagnosis present

## 2023-08-26 DIAGNOSIS — M419 Scoliosis, unspecified: Secondary | ICD-10-CM | POA: Diagnosis present

## 2023-08-26 DIAGNOSIS — D638 Anemia in other chronic diseases classified elsewhere: Secondary | ICD-10-CM | POA: Diagnosis present

## 2023-08-26 DIAGNOSIS — Z832 Family history of diseases of the blood and blood-forming organs and certain disorders involving the immune mechanism: Secondary | ICD-10-CM

## 2023-08-26 DIAGNOSIS — Z79899 Other long term (current) drug therapy: Secondary | ICD-10-CM | POA: Diagnosis not present

## 2023-08-26 DIAGNOSIS — D5700 Hb-SS disease with crisis, unspecified: Principal | ICD-10-CM | POA: Diagnosis present

## 2023-08-26 DIAGNOSIS — R197 Diarrhea, unspecified: Secondary | ICD-10-CM

## 2023-08-26 DIAGNOSIS — K529 Noninfective gastroenteritis and colitis, unspecified: Secondary | ICD-10-CM | POA: Diagnosis present

## 2023-08-26 DIAGNOSIS — Z8249 Family history of ischemic heart disease and other diseases of the circulatory system: Secondary | ICD-10-CM

## 2023-08-26 DIAGNOSIS — Z833 Family history of diabetes mellitus: Secondary | ICD-10-CM

## 2023-08-26 DIAGNOSIS — R112 Nausea with vomiting, unspecified: Secondary | ICD-10-CM | POA: Diagnosis present

## 2023-08-26 LAB — COMPREHENSIVE METABOLIC PANEL
ALT: 22 U/L (ref 0–44)
AST: 64 U/L — ABNORMAL HIGH (ref 15–41)
Albumin: 4.1 g/dL (ref 3.5–5.0)
Alkaline Phosphatase: 83 U/L (ref 38–126)
Anion gap: 9 (ref 5–15)
BUN: 6 mg/dL (ref 6–20)
CO2: 23 mmol/L (ref 22–32)
Calcium: 9.3 mg/dL (ref 8.9–10.3)
Chloride: 106 mmol/L (ref 98–111)
Creatinine, Ser: 0.91 mg/dL (ref 0.61–1.24)
GFR, Estimated: >60 mL/min (ref >60)
Glucose, Bld: 95 mg/dL (ref 70–99)
Potassium: 4.2 mmol/L (ref 3.5–5.1)
Sodium: 138 mmol/L (ref 135–145)
Total Bilirubin: 3.2 mg/dL — ABNORMAL HIGH (ref 0.0–1.2)
Total Protein: 8.8 g/dL — ABNORMAL HIGH (ref 6.5–8.1)

## 2023-08-26 LAB — CBC WITH DIFFERENTIAL/PLATELET
Abs Immature Granulocytes: 0.04 10*3/uL (ref 0.00–0.07)
Basophils Absolute: 0.1 10*3/uL (ref 0.0–0.1)
Basophils Relative: 1 %
Eosinophils Absolute: 0.1 10*3/uL (ref 0.0–0.5)
Eosinophils Relative: 1 %
HCT: 29.1 % — ABNORMAL LOW (ref 39.0–52.0)
Hemoglobin: 10.2 g/dL — ABNORMAL LOW (ref 13.0–17.0)
Immature Granulocytes: 0 %
Lymphocytes Relative: 30 %
Lymphs Abs: 4 10*3/uL (ref 0.7–4.0)
MCH: 34.2 pg — ABNORMAL HIGH (ref 26.0–34.0)
MCHC: 35.1 g/dL (ref 30.0–36.0)
MCV: 97.7 fL (ref 80.0–100.0)
Monocytes Absolute: 1.7 10*3/uL — ABNORMAL HIGH (ref 0.1–1.0)
Monocytes Relative: 13 %
Neutro Abs: 7.4 10*3/uL (ref 1.7–7.7)
Neutrophils Relative %: 55 %
Platelets: 369 10*3/uL (ref 150–400)
RBC: 2.98 MIL/uL — ABNORMAL LOW (ref 4.22–5.81)
RDW: 21.7 % — ABNORMAL HIGH (ref 11.5–15.5)
WBC: 13.3 10*3/uL — ABNORMAL HIGH (ref 4.0–10.5)
nRBC: 0.9 % — ABNORMAL HIGH (ref 0.0–0.2)

## 2023-08-26 LAB — RETICULOCYTES
Immature Retic Fract: 38.8 % — ABNORMAL HIGH (ref 2.3–15.9)
RBC.: 2.96 MIL/uL — ABNORMAL LOW (ref 4.22–5.81)
Retic Count, Absolute: 212.5 10*3/uL — ABNORMAL HIGH (ref 19.0–186.0)
Retic Ct Pct: 7.2 % — ABNORMAL HIGH (ref 0.4–3.1)

## 2023-08-26 LAB — LIPASE, BLOOD: Lipase: 27 U/L (ref 11–51)

## 2023-08-26 MED ORDER — HYDROMORPHONE 1 MG/ML IV SOLN
INTRAVENOUS | Status: DC
  Administered 2023-08-27: 5.5 mg via INTRAVENOUS
  Administered 2023-08-27: 30 mg via INTRAVENOUS
  Administered 2023-08-27: 6.5 mg via INTRAVENOUS
  Administered 2023-08-28: 3 mg via INTRAVENOUS
  Administered 2023-08-28: 8 mg via INTRAVENOUS
  Administered 2023-08-28: 1 mg via INTRAVENOUS
  Administered 2023-08-28: 2 mg via INTRAVENOUS
  Administered 2023-08-28: 6.5 mg via INTRAVENOUS
  Administered 2023-08-29 (×2): 5.5 mg via INTRAVENOUS
  Administered 2023-08-29: 30 mg via INTRAVENOUS
  Administered 2023-08-29 (×2): 5.5 mg via INTRAVENOUS
  Administered 2023-08-30: 6 mg via INTRAVENOUS
  Administered 2023-08-30: 30 mg via INTRAVENOUS
  Filled 2023-08-26 (×4): qty 30

## 2023-08-26 MED ORDER — NALOXONE HCL 0.4 MG/ML IJ SOLN: 0.4000 mg | INTRAMUSCULAR | Status: DC | PRN

## 2023-08-26 MED ORDER — ONDANSETRON HCL 4 MG/2ML IJ SOLN
4.0000 mg | INTRAMUSCULAR | Status: DC | PRN
  Administered 2023-08-26: 4 mg via INTRAVENOUS
  Filled 2023-08-26: qty 2

## 2023-08-26 MED ORDER — HYDROMORPHONE HCL 1 MG/ML IJ SOLN
0.5000 mg | INTRAMUSCULAR | Status: AC
  Administered 2023-08-26: 0.5 mg via INTRAVENOUS
  Filled 2023-08-26: qty 1

## 2023-08-26 MED ORDER — ENOXAPARIN SODIUM 40 MG/0.4ML IJ SOSY
40.0000 mg | PREFILLED_SYRINGE | INTRAMUSCULAR | Status: DC
  Administered 2023-08-26 – 2023-08-29 (×4): 40 mg via SUBCUTANEOUS
  Filled 2023-08-26 (×4): qty 0.4

## 2023-08-26 MED ORDER — SODIUM CHLORIDE 0.45 % IV SOLN: INTRAVENOUS | Status: DC

## 2023-08-26 MED ORDER — SENNOSIDES-DOCUSATE SODIUM 8.6-50 MG PO TABS
1.0000 | ORAL_TABLET | Freq: Two times a day (BID) | ORAL | Status: DC
  Administered 2023-08-26 – 2023-08-30 (×8): 1 via ORAL
  Filled 2023-08-26 (×8): qty 1

## 2023-08-26 MED ORDER — SODIUM CHLORIDE 0.9% FLUSH: 9.0000 mL | INTRAVENOUS | Status: DC | PRN

## 2023-08-26 MED ORDER — KETOROLAC TROMETHAMINE 15 MG/ML IJ SOLN
15.0000 mg | Freq: Four times a day (QID) | INTRAMUSCULAR | Status: DC
  Administered 2023-08-27 – 2023-08-30 (×14): 15 mg via INTRAVENOUS
  Filled 2023-08-26 (×14): qty 1

## 2023-08-26 MED ORDER — HYDROMORPHONE HCL 1 MG/ML IJ SOLN
1.0000 mg | Freq: Once | INTRAMUSCULAR | Status: AC
  Administered 2023-08-26: 1 mg via INTRAVENOUS
  Filled 2023-08-26: qty 1

## 2023-08-26 MED ORDER — SODIUM CHLORIDE 0.45 % IV SOLN: INTRAVENOUS | Status: AC

## 2023-08-26 MED ORDER — POLYETHYLENE GLYCOL 3350 17 G PO PACK
17.0000 g | PACK | Freq: Every day | ORAL | Status: DC | PRN
  Administered 2023-08-29 – 2023-08-30 (×2): 17 g via ORAL
  Filled 2023-08-26: qty 1

## 2023-08-26 MED ORDER — HYDROMORPHONE HCL 1 MG/ML IJ SOLN
1.0000 mg | INTRAMUSCULAR | Status: AC | PRN
  Administered 2023-08-26 – 2023-08-27 (×3): 1 mg via INTRAVENOUS
  Filled 2023-08-26 (×3): qty 1

## 2023-08-26 MED ORDER — FAMOTIDINE IN NACL 20-0.9 MG/50ML-% IV SOLN
20.0000 mg | Freq: Once | INTRAVENOUS | Status: AC
  Administered 2023-08-26: 20 mg via INTRAVENOUS
  Filled 2023-08-26: qty 50

## 2023-08-26 MED ORDER — HYDROMORPHONE HCL 1 MG/ML IJ SOLN
1.0000 mg | INTRAMUSCULAR | Status: AC
  Administered 2023-08-26: 1 mg via INTRAVENOUS
  Filled 2023-08-26: qty 1

## 2023-08-26 MED ORDER — ONDANSETRON HCL 4 MG/2ML IJ SOLN: 4.0000 mg | Freq: Four times a day (QID) | INTRAMUSCULAR | Status: DC | PRN

## 2023-08-26 MED ORDER — SODIUM CHLORIDE 0.9 % IV BOLUS
1000.0000 mL | Freq: Once | INTRAVENOUS | Status: AC
  Administered 2023-08-26: 1000 mL via INTRAVENOUS

## 2023-08-26 MED ORDER — DIPHENHYDRAMINE HCL 25 MG PO CAPS
25.0000 mg | ORAL_CAPSULE | ORAL | Status: DC | PRN
  Administered 2023-08-27 – 2023-08-28 (×3): 25 mg via ORAL
  Filled 2023-08-26 (×3): qty 1

## 2023-08-26 NOTE — ED Triage Notes (Signed)
 Abdominal pain with N/V/D that started a few days ago. Pt states he has sickle cell and started having a pain flare up yesterday.

## 2023-08-26 NOTE — ED Provider Notes (Signed)
  EMERGENCY DEPARTMENT AT [facility] Provider Note   CSN: [id number] Arrival date & time: 08/26/23  1650     History  Chief Complaint  Patient presents with   Abdominal Pain   Sickle Cell Pain Crisis    Manuel Mcguire is a 33 y.o. male.  Patient is a 32 year old male with a past medical history of sickle cell anemia presenting to the emergency department with vomiting, diarrhea and sickle cell pain.  The patient states for the last 3 days he has had nausea, vomiting and diarrhea.  He reports some associated epigastric abdominal pain.  He denies any fever.  He states that he has been trying to eat and drink but he either vomits or have diarrhea every time he does which is induced to sickle cell pain crisis.  He states that for the last 2 days he has had pain in his lower back.  He has been unable to keep down his home pain medication.  He denies any associated numbness or weakness, dysuria or hematuria.  He denies any chest pain or shortness of breath.  Of note, he said that he did return after a trip to [place] about 2 weeks ago but otherwise denies any known sick contacts, was on malaria prevention but denies any other antibiotic use.  The history is provided by the patient.  Abdominal Pain Sickle Cell Pain Crisis      Home Medications Prior to Admission medications   Medication Sig Start Date End Date Taking? Authorizing Provider  cholecalciferol (VITAMIN D3) 25 MCG (1000 UNIT) tablet Take 1,000 Units by mouth daily.    [provider]  folic acid (FOLVITE) 1 MG tablet Take 1 tablet (1 mg total) by mouth daily. 10/27/12   Altha Harm, MD  hydroxyurea (HYDREA) 500 MG capsule Take 3 capsules (1,500 mg total) by mouth daily. May take with food to minimize GI side effects. 10/27/12   Altha Harm, MD  lisinopril (ZESTRIL) 2.5 MG tablet Take 2.5 mg by mouth daily.    [provider]  naproxen (NAPROSYN) 250 MG tablet Take 250 mg by  mouth 2 (two) times daily with a meal. Patient not taking: Reported on 06/04/2023    [provider]  oxyCODONE (OXY IR/ROXICODONE) 5 MG immediate release tablet Take 5 mg by mouth every 6 (six) hours as needed for severe pain.    [provider]      Allergies    Patient has no known allergies.    Review of Systems   Review of Systems  Gastrointestinal:  Positive for abdominal pain.    Physical Exam Updated Vital Signs BP 134/83 (BP Location: Right Arm)   Pulse 89   Temp 97.8 F (36.6 C)   Resp 17   Ht 5\' 7"  (1.702 m)   Wt 72.6 kg   SpO2 98%   BMI 25.06 kg/m  Physical Exam Vitals and nursing note reviewed.  Constitutional:      General: He is not in acute distress.    Appearance: He is well-developed.  HENT:     Head: Normocephalic and atraumatic.     Mouth/Throat:     Pharynx: Oropharynx is clear.     Comments: Dry mucous membranes Eyes:     Extraocular Movements: Extraocular movements intact.  Cardiovascular:     Rate and Rhythm: Normal rate and regular rhythm.     Heart sounds: Normal heart sounds.  Pulmonary:     Effort: Pulmonary effort  is normal.     Breath sounds: Normal breath sounds.  Abdominal:     General: Abdomen is flat.     Palpations: Abdomen is soft.     Tenderness: There is abdominal tenderness in the epigastric area. There is no guarding or rebound.  Musculoskeletal:     Comments: No midline back tenderness, mild lumbar paraspinal muscle tenderness to palpation  Skin:    General: Skin is warm and dry.  Neurological:     General: No focal deficit present.     Mental Status: He is alert and oriented to person, place, and time.  Psychiatric:        Mood and Affect: Mood normal.        Behavior: Behavior normal.     ED Results / Procedures / Treatments   Labs (all labs ordered are listed, but only abnormal results are displayed) Labs Reviewed  COMPREHENSIVE METABOLIC PANEL - Abnormal; Notable for the following  components:      Result Value   Total Protein 8.8 (*)    AST 64 (*)    Total Bilirubin 3.2 (*)    All other components within normal limits  CBC WITH DIFFERENTIAL/PLATELET - Abnormal; Notable for the following components:   WBC 13.3 (*)    RBC 2.98 (*)    Hemoglobin 10.2 (*)    HCT 29.1 (*)    MCH 34.2 (*)    RDW 21.7 (*)    nRBC 0.9 (*)    Monocytes Absolute 1.7 (*)    All other components within normal limits  RETICULOCYTES - Abnormal; Notable for the following components:   Retic Ct Pct 7.2 (*)    RBC. 2.96 (*)    Retic Count, Absolute 212.5 (*)    Immature Retic Fract 38.8 (*)    All other components within normal limits  GASTROINTESTINAL PANEL BY PCR, STOOL (REPLACES STOOL CULTURE)  C DIFFICILE QUICK SCREEN W PCR REFLEX    LIPASE, BLOOD    EKG EKG Interpretation Date/Time:  Friday August 26 2023 17:04:21 EDT Ventricular Rate:  99 PR Interval:  150 QRS Duration:  80 QT Interval:  343 QTC Calculation: 441 R Axis:   42  Text Interpretation: Sinus rhythm Probable left atrial enlargement Borderline T abnormalities, lateral leads No significant change since last tracing Confirmed by Elayne Snare ([id number]) on 08/26/2023 5:29:51 PM  Radiology No results found.  Procedures Procedures    Medications Ordered in ED Medications  0.45 % sodium chloride infusion ( Intravenous New Bag/Given 08/26/23 1859)  ondansetron (ZOFRAN) injection 4 mg (4 mg Intravenous Given 08/26/23 1921)  HYDROmorphone (DILAUDID) injection 1 mg (has no administration in time range)  HYDROmorphone (DILAUDID) injection 0.5 mg (0.5 mg Intravenous Given 08/26/23 1804)  HYDROmorphone (DILAUDID) injection 1 mg (1 mg Intravenous Given 08/26/23 1853)  HYDROmorphone (DILAUDID) injection 1 mg (1 mg Intravenous Given 08/26/23 1922)  sodium chloride 0.9 % bolus 1,000 mL (1,000 mLs Intravenous New Bag/Given 08/26/23 1804)  famotidine (PEPCID) IVPB 20 mg premix (0 mg Intravenous Stopped 08/26/23 1833)    ED Course/  Medical Decision Making/ A&P Clinical Course as of 08/26/23 2024  Fri Aug 26, 2023  1903 Labs with mildly elevated AST though down trending from baseline. Mild leukocytosis though suspect some hemoconcentration with increased Hgb from baseline. [VK]  2023 Upon reassessment, patient reports nausea has improved and has not had diarrhea while in the ED. He is still having significant back pain and will need admission for SS pain crisis. [VK]  Clinical Course User Index [VK] Rexford Maus, DO                                 Medical Decision Making This patient presents to the ED with chief complaint(s) of N/V/D, SS pain crisis with pertinent past medical history of SS anemia which further complicates the presenting complaint. The complaint involves an extensive differential diagnosis and also carries with it a high risk of complications and morbidity.    The differential diagnosis includes gastroenteritis, gastritis, GERD, pancreatitis, hepatitis, dehydration, electrolyte abnormality, sickle cell pain crisis, aplastic crisis, no signs of acute chest or other complication, infectious diarrhea  Additional history obtained: Additional history obtained from N/A Records reviewed previous admission documents  ED Course and Reassessment: On patient's arrival he is hemodynamically stable in no acute distress.  Was initially evaluated in triage and EKG and labs initiated.  EKG showed normal sinus rhythm without acute ischemic changes.  Patient's labs are pending at this time.  He will be started on fluids, Zofran and Pepcid for GI management and will be started on pain control for his sickle cell pain crisis.  Stool studies were additionally ordered should he have diarrhea here to evaluate for possible infectious diarrhea and he will be closely reassessed.  Independent labs interpretation:  The following labs were independently interpreted: mild leukocytosis and increased Hgb from baseline,  likely hemoconcentration; appropriate reticulocyte count  Independent visualization of imaging: - N/A  Consultation: - Consulted or discussed management/test interpretation w/ external professional: hospitalist  Consideration for admission or further workup: patient requires admission for SS pain crisis Social Determinants of health: N/A    Amount and/or Complexity of Data Reviewed Labs: ordered.  Risk Prescription drug management. Decision regarding hospitalization.          Final Clinical Impression(s) / ED Diagnoses Final diagnoses:  Sickle cell pain crisis [facility])  Gastroenteritis    Rx / DC Orders ED Discharge Orders     None         Rexford Maus, DO 08/26/23 2024

## 2023-08-26 NOTE — H&P (Signed)
 History and Physical    KERN GINGRAS AOZ:[id number] DOB: 1991/05/15 DOA: 08/26/2023  PCP: Pcp, No   Patient coming from: Home   Chief Complaint: N/V/D, back pain   HPI: Manuel Mcguire is a 33 y.o. male with medical history significant for sickle cell anemia who presents with nausea, vomiting, diarrhea, and back pain.  Patient developed subjective fever, chills, nausea, and vomiting a few days ago. He then went on to develop diarrhea and is having at least 6 loose stools daily. There has been mild generalized abdominal discomfort associated with this. Yesterday, he began to experience low back pain similar to his prior pain crises. The back pain has become severe and is what prompts his presentation. He has not had subjective fever or chills in a couple days and feels that the diarrhea is beginning to improve. He denies dysuria or flank pain, denies chest pain, and denies SOB.   ED Course: Upon arrival to the ED, patient is found to be afebrile and saturating well on room air with normal heart rate and stable blood pressure.  Labs are most notable for normal creatinine, normal electrolytes, normal lipase, AST 64, total bilirubin 3.2, WBC 13,300, and hemoglobin 10.2.  GI pathogen panel and C. difficile testing were ordered from the ED and the patient was treated with IV fluids, Pepcid, Zofran, and multiple doses of IV Dilaudid.  He continues to have severe pain consistent with prior sickle cell pain crises.  Review of Systems:  All other systems reviewed and apart from HPI, are negative.  Past Medical History:  Diagnosis Date   Scoliosis    Sickle cell anemia with pain (HCC)     History reviewed. No pertinent surgical history.  Social History:   reports that he has never smoked. He does not have any smokeless tobacco history on file. He reports that he does not drink alcohol and does not use drugs.  No Known Allergies  Family History  Problem Relation Age of Onset   Diabetes  Mother    Hypertension Mother    Sickle cell anemia Sister      Prior to Admission medications   Medication Sig Start Date End Date Taking? Authorizing Provider  cholecalciferol (VITAMIN D3) 25 MCG (1000 UNIT) tablet Take 1,000 Units by mouth daily.    [provider]  folic acid (FOLVITE) 1 MG tablet Take 1 tablet (1 mg total) by mouth daily. 10/27/12   Altha Harm, MD  hydroxyurea (HYDREA) 500 MG capsule Take 3 capsules (1,500 mg total) by mouth daily. May take with food to minimize GI side effects. 10/27/12   Altha Harm, MD  lisinopril (ZESTRIL) 2.5 MG tablet Take 2.5 mg by mouth daily.    [provider]  naproxen (NAPROSYN) 250 MG tablet Take 250 mg by mouth 2 (two) times daily with a meal. Patient not taking: Reported on 06/04/2023    [provider]  oxyCODONE (OXY IR/ROXICODONE) 5 MG immediate release tablet Take 5 mg by mouth every 6 (six) hours as needed for severe pain.    [provider]    Physical Exam: Vitals:   08/26/23 1705 08/26/23 1707 08/26/23 2010  BP: 127/88  134/83  Pulse: 96  89  Resp: 19  17  Temp: 98.5 F (36.9 C)  97.8 F (36.6 C)  SpO2: 93%  98%  Weight:  72.6 kg   Height:  5\' 7"  (1.702 m)     Constitutional: NAD, no pallor or diaphoresis  Eyes: PERTLA, lids and conjunctivae normal ENMT: Mucous membranes are moist. Posterior pharynx clear of any exudate or lesions.   Neck: supple, no masses  Respiratory: no wheezing, no crackles. No accessory muscle use.  Cardiovascular: S1 & S2 heard, regular rate and rhythm. No extremity edema. No significant JVD. Abdomen: Soft, no distension, no guarding. Bowel sounds active.  Musculoskeletal: no clubbing / cyanosis. No joint deformity upper and lower extremities.   Skin: no significant rashes, lesions, ulcers. Warm, dry, well-perfused. Neurologic: CN 2-12 grossly intact. Moving all extremities. Alert and oriented.  Psychiatric: Calm. Cooperative.    Labs  and Imaging on Admission: I have personally reviewed following labs and imaging studies  CBC: Recent Labs  Lab 08/26/23 1732  WBC 13.3*  NEUTROABS 7.4  HGB 10.2*  HCT 29.1*  MCV 97.7  PLT 369   Basic Metabolic Panel: Recent Labs  Lab 08/26/23 1732  NA 138  K 4.2  CL 106  CO2 23  GLUCOSE 95  BUN 6  CREATININE 0.91  CALCIUM 9.3   GFR: Estimated Creatinine Clearance: 107.9 mL/min (by C-G formula based on SCr of 0.91 mg/dL). Liver Function Tests: Recent Labs  Lab 08/26/23 1732  AST 64*  ALT 22  ALKPHOS 83  BILITOT 3.2*  PROT 8.8*  ALBUMIN 4.1   Recent Labs  Lab 08/26/23 1732  LIPASE 27   No results for input(s): "AMMONIA" in the last 168 hours. Coagulation Profile: No results for input(s): "INR", "PROTIME" in the last 168 hours. Cardiac Enzymes: No results for input(s): "CKTOTAL", "CKMB", "CKMBINDEX", "TROPONINI" in the last 168 hours. BNP (last 3 results) No results for input(s): "PROBNP" in the last 8760 hours. HbA1C: No results for input(s): "HGBA1C" in the last 72 hours. CBG: No results for input(s): "GLUCAP" in the last 168 hours. Lipid Profile: No results for input(s): "CHOL", "HDL", "LDLCALC", "TRIG", "CHOLHDL", "LDLDIRECT" in the last 72 hours. Thyroid Function Tests: No results for input(s): "TSH", "T4TOTAL", "FREET4", "T3FREE", "THYROIDAB" in the last 72 hours. Anemia Panel: Recent Labs    08/26/23 1732  RETICCTPCT 7.2*   Urine analysis:    Component Value Date/Time   COLORURINE YELLOW 06/04/2023 2329   APPEARANCEUR CLEAR 06/04/2023 2329   LABSPEC 1.011 06/04/2023 2329   PHURINE 5.0 06/04/2023 2329   GLUCOSEU NEGATIVE 06/04/2023 2329   HGBUR SMALL (A) 06/04/2023 2329   BILIRUBINUR NEGATIVE 06/04/2023 2329   KETONESUR NEGATIVE 06/04/2023 2329   PROTEINUR 30 (A) 06/04/2023 2329   UROBILINOGEN 1.0 10/27/2010 0630   NITRITE NEGATIVE 06/04/2023 2329   LEUKOCYTESUR NEGATIVE 06/04/2023 2329   Sepsis  Labs: @LABRCNTIP (procalcitonin:4,lacticidven:4) )No results found for this or any previous visit (from the past 240 hours).   Radiological Exams on Admission: No results found.  EKG: Independently reviewed. Sinus rhythm.   Assessment/Plan   1. Sickle cell anemia with pain crisis  - Start weight-based Dilaudid PCA, schedule Toradol, continue IVF hydration  2. N/V/D  - Exam benign, he is afebrile, and leukocytosis appears chronic; this is most likely acute viral GE - Continue IVF and symptom-management, continue enteric precautions for now, follow-up stool studies    DVT prophylaxis: Lovenox  Code Status: Full  Level of Care: Level of care: Med-Surg Family Communication: None present   Disposition Plan:  Patient is from: home  Anticipated d/c is to: Home  Anticipated d/c date is: 08/29/23  Patient currently: pending pain-control, stool studies Consults called: None  Admission status: Inpatient     Briscoe Deutscher, MD [facility]  08/26/2023,  8:51 PM

## 2023-08-27 DIAGNOSIS — D5700 Hb-SS disease with crisis, unspecified: Secondary | ICD-10-CM

## 2023-08-27 MED ORDER — DEXTROSE-SODIUM CHLORIDE 5-0.45 % IV SOLN: INTRAVENOUS | Status: AC

## 2023-08-27 NOTE — Progress Notes (Signed)
 SICKLE CELL SERVICE PROGRESS NOTE  TEE RICHESON OVF:[id number] DOB: March 16, 1991 DOA: 08/26/2023 PCP: Pcp, No  Assessment/Plan: Principal Problem:   Sickle cell pain crisis (HCC) Active Problems:   Nausea vomiting and diarrhea  Sickle cell pain crisis: Suspected triggered by some acute viral infection.  Patient on Dilaudid PCA and Toradol.  Also IV fluids.  Patient will be on D5 half-normal.  Will continue to monitor. Nausea vomiting and diarrhea: Suspected acute gastroenteritis.  Stool studies was possibly done but not done yet. Anemia of chronic disease: Continue to monitor H&H. Leukocytosis: White count has improved to 13.3.  Recheck labs.  Code Status: Full code Family Communication: No family at bedside Disposition Plan: Home when ready  [facility]  Pager [phone]. If 7PM-7AM, please contact night-coverage.  08/27/2023, 6:50 AM  LOS: 1 day   Brief narrative: Patient developed subjective fever, chills, nausea, and vomiting a few days ago. He then went on to develop diarrhea and is having at least 6 loose stools daily. There has been mild generalized abdominal discomfort associated with this. Yesterday, he began to experience low back pain similar to his prior pain crises. The back pain has become severe and is what prompts his presentation. He has not had subjective fever or chills in a couple days and feels that the diarrhea is beginning to improve. He denies dysuria or flank pain, denies chest pain, and denies SOB.   Consultants: None  Procedures: None  Antibiotics: None  HPI/Subjective: Patient still having pain  Objective: Vitals:   08/27/23 0000 08/27/23 0016 08/27/23 0326 08/27/23 0636  BP: 120/83  130/86 (!) 118/90  Pulse: 75  95 69  Resp: 17  16 18   Temp:  (!) 97.5 F (36.4 C) 97.8 F (36.6 C) 97.9 F (36.6 C)  TempSrc:  Oral Oral Oral  SpO2: 92%  96% 95%  Weight:      Height:       Weight change:  No intake or output data in the 24 hours ending  08/27/23 0650  General: Alert, awake, oriented x3, in no acute distress.  HEENT: [place]/AT [place]ER[place], EOMI Neck: Trachea midline,  no masses, no thyromegal,y no JVD, no carotid bruit OROPHARYNX:  Moist, No exudate/ erythema/lesions.  Heart: Regular rate and rhythm, without murmurs, rubs, gallops, PMI non-displaced, no heaves or thrills on palpation.  Lungs: Clear to auscultation, no wheezing or rhonchi noted. No increased vocal fremitus resonant to percussion  Abdomen: Soft, nontender, nondistended, positive bowel sounds, no masses no hepatosplenomegaly noted..  Neuro: No focal neurological deficits noted cranial nerves II through XII grossly intact. DTRs 2+ bilaterally upper and lower extremities. Strength 5 out of 5 in bilateral upper and lower extremities. Musculoskeletal: No warm swelling or erythema around joints, no spinal tenderness noted. Psychiatric: Patient alert and oriented x3, good insight and cognition, good recent to remote recall. Lymph node survey: No cervical axillary or inguinal lymphadenopathy noted.   Data Reviewed: Basic Metabolic Panel: Recent Labs  Lab 08/26/23 1732  NA 138  K 4.2  CL 106  CO2 23  GLUCOSE 95  BUN 6  CREATININE 0.91  CALCIUM 9.3   Liver Function Tests: Recent Labs  Lab 08/26/23 1732  AST 64*  ALT 22  ALKPHOS 83  BILITOT 3.2*  PROT 8.8*  ALBUMIN 4.1   Recent Labs  Lab 08/26/23 1732  LIPASE 27   No results for input(s): "AMMONIA" in the last 168 hours. CBC: Recent Labs  Lab 08/26/23 1732  WBC 13.3*  NEUTROABS  7.4  HGB 10.2*  HCT 29.1*  MCV 97.7  PLT 369   Cardiac Enzymes: No results for input(s): "CKTOTAL", "CKMB", "CKMBINDEX", "TROPONINI" in the last 168 hours. BNP (last 3 results) No results for input(s): "BNP" in the last 8760 hours.  ProBNP (last 3 results) No results for input(s): "PROBNP" in the last 8760 hours.  CBG: No results for input(s): "GLUCAP" in the last 168 hours.  No results found for this or any  previous visit (from the past 240 hours).   Studies: No results found.  Scheduled Meds:  enoxaparin (LOVENOX) injection  40 mg Subcutaneous Q24H   HYDROmorphone   Intravenous Q4H   ketorolac  15 mg Intravenous Q6H   senna-docusate  1 tablet Oral BID   Continuous Infusions:  sodium chloride 100 mL/hr at 08/26/23 2159    Principal Problem:   Sickle cell pain crisis (HCC) Active Problems:   Nausea vomiting and diarrhea

## 2023-08-28 MED ORDER — LISINOPRIL 5 MG PO TABS
2.5000 mg | ORAL_TABLET | Freq: Every day | ORAL | Status: DC
  Administered 2023-08-28 – 2023-08-30 (×3): 2.5 mg via ORAL
  Filled 2023-08-28 (×3): qty 1

## 2023-08-28 MED ORDER — OXYCODONE HCL 5 MG PO TABS
5.0000 mg | ORAL_TABLET | ORAL | Status: DC | PRN
  Administered 2023-08-28 – 2023-08-30 (×8): 5 mg via ORAL
  Filled 2023-08-28 (×8): qty 1

## 2023-08-28 MED ORDER — HYDROXYUREA 500 MG PO CAPS
1500.0000 mg | ORAL_CAPSULE | Freq: Every day | ORAL | Status: DC
  Administered 2023-08-28 – 2023-08-30 (×3): 1500 mg via ORAL
  Filled 2023-08-28 (×3): qty 3

## 2023-08-28 MED ORDER — FOLIC ACID 1 MG PO TABS
1.0000 mg | ORAL_TABLET | Freq: Every day | ORAL | Status: DC
  Administered 2023-08-28 – 2023-08-30 (×3): 1 mg via ORAL
  Filled 2023-08-28 (×3): qty 1

## 2023-08-28 NOTE — Plan of Care (Signed)
  Problem: Self-Care: Goal: Ability to incorporate actions that prevent/reduce pain crisis will improve Outcome: Progressing   Problem: Fluid Volume: Goal: Ability to maintain a balanced intake and output will improve Outcome: Progressing   Problem: Education: Goal: Knowledge of General Education information will improve Description: Including pain rating scale, medication(s)/side effects and non-pharmacologic comfort measures Outcome: Progressing   Problem: Health Behavior/Discharge Planning: Goal: Ability to manage health-related needs will improve Outcome: Progressing

## 2023-08-28 NOTE — TOC Initial Note (Signed)
 Transition of Care [facility]) - Initial/Assessment Note    Patient Details  Name: Manuel Mcguire MRN: [id number] Date of Birth: May 12, 1991  [facility]) CM/SW Contact:    Adrian Prows, RN Phone Number: 08/28/2023, 1:45 PM  Clinical Narrative:                 TOC for d/c panning; spoke w/ pt in room; pt says he lives at home; he plans to return at d/c; pt identified POC father Manuel Mcguire (father) [phone]; pt says he has insurance; he also says his PCP is at [facility]; pt will arrange transportation; pt says he receives food stamps; he has difficulty paying for housing and utilities; pt says he has a cane and cpap; pt does not have HH services or home oxygen; he agrees to receive resources for social services and financial assistance; resources placed in d/c instructions; copies of resources also given to pt; TOC is following.  Expected Discharge Plan: Home/Self Care Barriers to Discharge: Continued Medical Work up   Patient Goals and CMS Choice Patient states their goals for this hospitalization and ongoing recovery are:: home          Expected Discharge Plan and Services   Discharge Planning Services: CM Consult   Living arrangements for the past 2 months: Apartment                                      Prior Living Arrangements/Services Living arrangements for the past 2 months: Apartment Lives with:: Self Patient language and need for interpreter reviewed:: Yes Do you feel safe going back to the place where you live?: Yes      Need for Family Participation in Patient Care: Yes (Comment) Care giver support system in place?: Yes (comment) Current home services: DME (cane, cpap) Criminal Activity/Legal Involvement Pertinent to Current Situation/Hospitalization: No - Comment as needed  Activities of Daily Living   ADL Screening (condition at time of admission) Independently performs ADLs?: Yes (appropriate for developmental age) Is the  patient deaf or have difficulty hearing?: No Does the patient have difficulty seeing, even when wearing glasses/contacts?: No Does the patient have difficulty concentrating, remembering, or making decisions?: No  Permission Sought/Granted Permission sought to share information with : Case Manager Permission granted to share information with : Yes, Verbal Permission Granted  Share Information with NAME: Case Manager     Permission granted to share info w Relationship: [name] (father) [phone]     Emotional Assessment Appearance:: Appears stated age Attitude/Demeanor/Rapport: Gracious Affect (typically observed): Accepting Orientation: : Oriented to Self, Oriented to Place, Oriented to  Time, Oriented to Situation Alcohol / Substance Use: Not Applicable Psych Involvement: No (comment)  Admission diagnosis:  Gastroenteritis [K52.9] Sickle cell pain crisis (HCC) [D57.00] Patient Active Problem List   Diagnosis Date Noted   Nausea vomiting and diarrhea 08/26/2023   Sickle cell pain crisis (HCC) 02/05/2023   Acute respiratory failure with hypoxia (HCC) 02/05/2023   CAP (community acquired pneumonia) 02/05/2023   Sickle cell crisis (HCC) 10/27/2012   PCP:  Pcp, No Pharmacy:   [id number] [place], [place] - [address] [place] [address] Phone: [phone] Fax: [phone]     Social Drivers of Health (SDOH) Social History: SDOH Screenings   Food Insecurity: No Food Insecurity (08/28/2023)  Housing: High Risk (08/28/2023)  Transportation Needs: No Transportation Needs (  08/28/2023)  Utilities: At Risk (08/28/2023)  Tobacco Use: Unknown (08/26/2023)   SDOH Interventions: Food Insecurity Interventions: Intervention Not Indicated, Inpatient TOC Housing Interventions: Community Resources Provided, Inpatient TOC Transportation Interventions: Intervention Not Indicated, Inpatient TOC Utilities Interventions: [employer] Provided, Inpatient TOC   Readmission Risk Interventions     No data to display

## 2023-08-28 NOTE — Progress Notes (Signed)
 SICKLE CELL SERVICE PROGRESS NOTE  Manuel Mcguire WGN:[id number] DOB: 16-Jul-1990 DOA: 08/26/2023 PCP: Pcp, No  Assessment/Plan: Principal Problem:   Sickle cell pain crisis (HCC) Active Problems:   Nausea vomiting and diarrhea  Sickle cell pain crisis: Suspected triggered by some acute viral infection.  Patient on Dilaudid PCA and Toradol.  Also IV fluids.  Patient will be on D5 half-normal.  Patient has pain not controlled with the Dilaudid PCA alone.  I will add oral medications.  Oxycodone 5 mg every 4 hours as needed.  Will continue to monitor. Nausea vomiting and diarrhea: Suspected acute gastroenteritis.  Stool studies was possibly done but not done yet. Anemia of chronic disease: Continue to monitor H&H. Leukocytosis: White count has improved to 13.3.  Recheck labs in the morning.  Code Status: Full code Family Communication: No family at bedside Disposition Plan: Home when ready  [facility]  Pager [phone]. If 7PM-7AM, please contact night-coverage.  08/28/2023, 1:22 PM  LOS: 2 days   Brief narrative: Patient developed subjective fever, chills, nausea, and vomiting a few days ago. He then went on to develop diarrhea and is having at least 6 loose stools daily. There has been mild generalized abdominal discomfort associated with this. Yesterday, he began to experience low back pain similar to his prior pain crises. The back pain has become severe and is what prompts his presentation. He has not had subjective fever or chills in a couple days and feels that the diarrhea is beginning to improve. He denies dysuria or flank pain, denies chest pain, and denies SOB.   Consultants: None  Procedures: None  Antibiotics: None  HPI/Subjective: Patient still having pain at 9 out of 10.  Patient is on Dilaudid PCA, Toradol, IV fluids.  Objective: Vitals:   08/28/23 0209 08/28/23 0433 08/28/23 0847 08/28/23 1223  BP:      Pulse:      Resp: 18 19 17 14   Temp:      TempSrc:       SpO2: (!) 88% 90%    Weight:      Height:       Weight change:   Intake/Output Summary (Last 24 hours) at 08/28/2023 1322 Last data filed at 08/28/2023 0900 Gross per 24 hour  Intake 1388.83 ml  Output 1170 ml  Net 218.83 ml    General: Alert, awake, oriented x3, in no acute distress.  HEENT: [place]/AT [place], EOMI Neck: Trachea midline,  no masses, no thyromegal,y no JVD, no carotid bruit OROPHARYNX:  Moist, No exudate/ erythema/lesions.  Heart: Regular rate and rhythm, without murmurs, rubs, gallops, PMI non-displaced, no heaves or thrills on palpation.  Lungs: Clear to auscultation, no wheezing or rhonchi noted. No increased vocal fremitus resonant to percussion  Abdomen: Soft, nontender, nondistended, positive bowel sounds, no masses no hepatosplenomegaly noted..  Neuro: No focal neurological deficits noted cranial nerves II through XII grossly intact. DTRs 2+ bilaterally upper and lower extremities. Strength 5 out of 5 in bilateral upper and lower extremities. Musculoskeletal: No warm swelling or erythema around joints, no spinal tenderness noted. Psychiatric: Patient alert and oriented x3, good insight and cognition, good recent to remote recall. Lymph node survey: No cervical axillary or inguinal lymphadenopathy noted.   Data Reviewed: Basic Metabolic Panel: Recent Labs  Lab 08/26/23 1732  NA 138  K 4.2  CL 106  CO2 23  GLUCOSE 95  BUN 6  CREATININE 0.91  CALCIUM 9.3   Liver Function Tests: Recent Labs  Lab 08/26/23  1732  AST 64*  ALT 22  ALKPHOS 83  BILITOT 3.2*  PROT 8.8*  ALBUMIN 4.1   Recent Labs  Lab 08/26/23 1732  LIPASE 27   No results for input(s): "AMMONIA" in the last 168 hours. CBC: Recent Labs  Lab 08/26/23 1732  WBC 13.3*  NEUTROABS 7.4  HGB 10.2*  HCT 29.1*  MCV 97.7  PLT 369   Cardiac Enzymes: No results for input(s): "CKTOTAL", "CKMB", "CKMBINDEX", "TROPONINI" in the last 168 hours. BNP (last 3 results) No results for  input(s): "BNP" in the last 8760 hours.  ProBNP (last 3 results) No results for input(s): "PROBNP" in the last 8760 hours.  CBG: No results for input(s): "GLUCAP" in the last 168 hours.  No results found for this or any previous visit (from the past 240 hours).   Studies: No results found.  Scheduled Meds:  enoxaparin (LOVENOX) injection  40 mg Subcutaneous Q24H   HYDROmorphone   Intravenous Q4H   ketorolac  15 mg Intravenous Q6H   senna-docusate  1 tablet Oral BID   Continuous Infusions:  dextrose 5 % and 0.45 % NaCl 75 mL/hr at 08/28/23 1220    Principal Problem:   Sickle cell pain crisis (HCC) Active Problems:   Nausea vomiting and diarrhea

## 2023-08-29 LAB — CBC WITH DIFFERENTIAL/PLATELET
Abs Immature Granulocytes: 0.13 10*3/uL — ABNORMAL HIGH (ref 0.00–0.07)
Basophils Absolute: 0.1 10*3/uL (ref 0.0–0.1)
Basophils Relative: 1 %
Eosinophils Absolute: 0.4 10*3/uL (ref 0.0–0.5)
Eosinophils Relative: 2 %
HCT: 21.9 % — ABNORMAL LOW (ref 39.0–52.0)
Hemoglobin: 8 g/dL — ABNORMAL LOW (ref 13.0–17.0)
Immature Granulocytes: 1 %
Lymphocytes Relative: 40 %
Lymphs Abs: 7.5 10*3/uL — ABNORMAL HIGH (ref 0.7–4.0)
MCH: 34.3 pg — ABNORMAL HIGH (ref 26.0–34.0)
MCHC: 36.5 g/dL — ABNORMAL HIGH (ref 30.0–36.0)
MCV: 94 fL (ref 80.0–100.0)
Monocytes Absolute: 2.9 10*3/uL — ABNORMAL HIGH (ref 0.1–1.0)
Monocytes Relative: 16 %
Neutro Abs: 7.8 10*3/uL — ABNORMAL HIGH (ref 1.7–7.7)
Neutrophils Relative %: 40 %
Platelets: 269 10*3/uL (ref 150–400)
RBC: 2.33 MIL/uL — ABNORMAL LOW (ref 4.22–5.81)
RDW: 24 % — ABNORMAL HIGH (ref 11.5–15.5)
WBC: 18.8 10*3/uL — ABNORMAL HIGH (ref 4.0–10.5)
nRBC: 1.2 % — ABNORMAL HIGH (ref 0.0–0.2)

## 2023-08-29 LAB — COMPREHENSIVE METABOLIC PANEL
ALT: 25 U/L (ref 0–44)
AST: 60 U/L — ABNORMAL HIGH (ref 15–41)
Albumin: 4 g/dL (ref 3.5–5.0)
Alkaline Phosphatase: 78 U/L (ref 38–126)
Anion gap: 9 (ref 5–15)
BUN: 15 mg/dL (ref 6–20)
CO2: 24 mmol/L (ref 22–32)
Calcium: 8.8 mg/dL — ABNORMAL LOW (ref 8.9–10.3)
Chloride: 104 mmol/L (ref 98–111)
Creatinine, Ser: 0.71 mg/dL (ref 0.61–1.24)
GFR, Estimated: >60 mL/min (ref >60)
Glucose, Bld: 86 mg/dL (ref 70–99)
Potassium: 3.8 mmol/L (ref 3.5–5.1)
Sodium: 137 mmol/L (ref 135–145)
Total Bilirubin: 4.2 mg/dL — ABNORMAL HIGH (ref 0.0–1.2)
Total Protein: 7.7 g/dL (ref 6.5–8.1)

## 2023-08-29 NOTE — Progress Notes (Cosign Needed Addendum)
 Patient ID: Manuel Mcguire, male   DOB: Oct 25, 1990, 33 y.o.   MRN: [id number] Subjective: Manuel Mcguire is a 33 year old male with a medical history significant for sickle cell disease, He presented to the ED epigastric pain, nausea, vomiting and diarrhea he was admitted for sickle cell pain crisis.   Today, patient said he is feeling slight discomfort in his abdomen, ongoing pain in his lower back and legs.His pain is about a 8/10 as against his goal of 3/10.  He is finding it difficult to find a position of rest.  He however denies any fever, cough, chest pain, shortness of breath. Ongoing nausea, Zofran administered. vomiting and diarrhea resolved.  No headache.  No urinary symptoms.    Objective:  Vital signs in last 24 hours:  Vitals:   08/29/23 0524 08/29/23 0726 08/29/23 0930 08/29/23 1129  BP: (!) 140/71  133/83   Pulse: 92  66   Resp: 17 17 16 18   Temp: 98.4 F (36.9 C)     TempSrc: Oral     SpO2: 93% 90% 90% 91%  Weight:      Height:        Intake/Output from previous day:   Intake/Output Summary (Last 24 hours) at 08/29/2023 1436 Last data filed at 08/29/2023 1400 Gross per 24 hour  Intake 710 ml  Output 1200 ml  Net -490 ml    Physical Exam: General: Alert, awake, oriented x3, in no acute distress.  HEENT: [place]/AT PEERL, EOMI Neck: Trachea midline,  no masses, no thyromegal,y no JVD, no carotid bruit OROPHARYNX:  Moist, No exudate/ erythema/lesions.  Heart: Regular rate and rhythm, without murmurs, rubs, gallops, PMI non-displaced, no heaves or thrills on palpation.  Lungs: Clear to auscultation, no wheezing or rhonchi noted. No increased vocal fremitus resonant to percussion  Abdomen: Soft, nontender, nondistended, positive bowel sounds, no masses no hepatosplenomegaly noted..  Neuro: No focal neurological deficits noted cranial nerves II through XII grossly intact. DTRs 2+ bilaterally upper and lower extremities. Strength 5 out of 5 in bilateral upper and lower  extremities. Musculoskeletal: No warm swelling or erythema around joints. Lower back and lower extremity pain. Psychiatric: Patient alert and oriented x3, good insight and cognition, good recent to remote recall. Lymph node survey: No cervical axillary or inguinal lymphadenopathy noted.  Lab Results:  Basic Metabolic Panel:    Component Value Date/Time   NA 137 08/29/2023 0440   K 3.8 08/29/2023 0440   CL 104 08/29/2023 0440   CO2 24 08/29/2023 0440   BUN 15 08/29/2023 0440   CREATININE 0.71 08/29/2023 0440   GLUCOSE 86 08/29/2023 0440   CALCIUM 8.8 (L) 08/29/2023 0440   CBC:    Component Value Date/Time   WBC 18.8 (H) 08/29/2023 0440   HGB 8.0 (L) 08/29/2023 0440   HCT 21.9 (L) 08/29/2023 0440   PLT 269 08/29/2023 0440   MCV 94.0 08/29/2023 0440   NEUTROABS 7.8 (H) 08/29/2023 0440   LYMPHSABS 7.5 (H) 08/29/2023 0440   MONOABS 2.9 (H) 08/29/2023 0440   EOSABS 0.4 08/29/2023 0440   BASOSABS 0.1 08/29/2023 0440    No results found for this or any previous visit (from the past 240 hours).  Studies/Results: No results found.  Medications: Scheduled Meds:  enoxaparin (LOVENOX) injection  40 mg Subcutaneous Q24H   folic acid  1 mg Oral Daily   HYDROmorphone   Intravenous Q4H   hydroxyurea  1,500 mg Oral Daily   ketorolac  15 mg Intravenous Q6H  lisinopril  2.5 mg Oral Daily   senna-docusate  1 tablet Oral BID   Continuous Infusions: PRN Meds:.diphenhydrAMINE, naloxone **AND** sodium chloride flush, ondansetron (ZOFRAN) IV, oxyCODONE, polyethylene glycol  Consultants: None  Procedures: None  Antibiotics: None  Assessment/Plan: Principal Problem:   Sickle cell pain crisis (HCC) Active Problems:   Nausea vomiting and diarrhea   Hb Sickle Cell Disease with Pain crisis: Continue IVF 0.45% Saline KVO, continue weight based Dilaudid PCA, IV Toradol 15 mg Q 6 H for a total of 5 days, continue oral home pain medications as ordered. Monitor vitals very closely,  Re-evaluate pain scale regularly, 2 L of Oxygen by [place]. Patient encouraged to ambulate on the hallway today.  Leukocytosis: Elevated no acute s/s of infection. Will continue to monitor Anemia of Chronic Disease: Hemoglobin slightly decreased, no need for transfusion. Will continue to monitor. CBC in the am Chronic pain Syndrome: Continue home medication Nausea: Zofran administered, patient is toleration PO Vomiting and diarrhea: resolved   Code Status: Full Code Family Communication: N/A Disposition Plan: Not yet ready for discharge  Daryll Drown NP  If 7PM-7AM, please contact night-coverage.  08/29/2023, 2:36 PM  LOS: 3 days

## 2023-08-30 NOTE — Plan of Care (Signed)
  Problem: Education: Goal: Knowledge of vaso-occlusive preventative measures will improve Outcome: Adequate for Discharge Goal: Awareness of infection prevention will improve Outcome: Adequate for Discharge Goal: Awareness of signs and symptoms of anemia will improve Outcome: Adequate for Discharge Goal: Long-term complications will improve Outcome: Adequate for Discharge   Problem: Self-Care: Goal: Ability to incorporate actions that prevent/reduce pain crisis will improve Outcome: Adequate for Discharge   Problem: Bowel/Gastric: Goal: Gut motility will be maintained Outcome: Adequate for Discharge   Problem: Tissue Perfusion: Goal: Complications related to inadequate tissue perfusion will be avoided or minimized Outcome: Adequate for Discharge   Problem: Respiratory: Goal: Pulmonary complications will be avoided or minimized Outcome: Adequate for Discharge Goal: Acute Chest Syndrome will be identified early to prevent complications Outcome: Adequate for Discharge   Problem: Fluid Volume: Goal: Ability to maintain a balanced intake and output will improve Outcome: Adequate for Discharge   Problem: Sensory: Goal: Pain level will decrease with appropriate interventions Outcome: Adequate for Discharge   Problem: Health Behavior: Goal: Postive changes in compliance with treatment and prescription regimens will improve Outcome: Adequate for Discharge   Problem: Education: Goal: Knowledge of General Education information will improve Description: Including pain rating scale, medication(s)/side effects and non-pharmacologic comfort measures Outcome: Adequate for Discharge   Problem: Health Behavior/Discharge Planning: Goal: Ability to manage health-related needs will improve Outcome: Adequate for Discharge   Problem: Clinical Measurements: Goal: Ability to maintain clinical measurements within normal limits will improve Outcome: Adequate for Discharge Goal: Will remain  free from infection Outcome: Adequate for Discharge Goal: Diagnostic test results will improve Outcome: Adequate for Discharge Goal: Respiratory complications will improve Outcome: Adequate for Discharge Goal: Cardiovascular complication will be avoided Outcome: Adequate for Discharge   Problem: Activity: Goal: Risk for activity intolerance will decrease Outcome: Adequate for Discharge   Problem: Nutrition: Goal: Adequate nutrition will be maintained Outcome: Adequate for Discharge   Problem: Coping: Goal: Level of anxiety will decrease Outcome: Adequate for Discharge   Problem: Elimination: Goal: Will not experience complications related to bowel motility Outcome: Adequate for Discharge Goal: Will not experience complications related to urinary retention Outcome: Adequate for Discharge   Problem: Pain Managment: Goal: General experience of comfort will improve and/or be controlled Outcome: Adequate for Discharge   Problem: Safety: Goal: Ability to remain free from injury will improve Outcome: Adequate for Discharge   Problem: Skin Integrity: Goal: Risk for impaired skin integrity will decrease Outcome: Adequate for Discharge

## 2023-08-30 NOTE — Progress Notes (Signed)
Reviewed written d/c instructions w pt and all questions answered. He verbalized understanding. D/C ambulatory w all belongings in stable condition 

## 2023-08-30 NOTE — Discharge Summary (Signed)
 Physician Discharge Summary  RODRICUS CANDELARIA Mcguire:[id number] DOB: September 26, 1990 DOA: 08/26/2023  PCP: Pcp, No  Admit date: 08/26/2023  Discharge date: 08/30/2023  Discharge Diagnoses:  Principal Problem:   Sickle cell pain crisis (HCC) Active Problems:   Nausea vomiting and diarrhea   Discharge Condition: Stable  Disposition:  Pt is discharged home in good condition and is to follow up with Pcp, No this week to have labs evaluated. Manuel Mcguire is instructed to increase activity slowly and balance with rest for the next few days, and use prescribed medication to complete treatment of pain  Diet: Regular Wt Readings from Last 3 Encounters:  08/26/23 72.6 kg  06/04/23 77.1 kg  03/25/23 72.6 kg    History of present illness:  Manuel Mcguire is a 33 y.o. male with medical history significant of Sickle cell anemia who presented to the ED with body pain, with worse pain in his lower back.  Patient endorses 2 days of diffuse bodyaches chest pain and back pain as well as nausea, vomiting and diarrhea. Symptoms been going on for last 2 days.  He took his home pain medications without relief so he presented to the ER where he was found to be afebrile hemodynamically well.  Labs were obtained on presentation which demonstrated WBC 18.8, hemoglobin 8.0, platelets 269, troponin 6.  Patient was given IV pain control with persistent pain so he was admitted for further management.  On evaluation he was sitting up and endorses pain of 9/10 he denies any infectious complaints including fevers or chills   ED Course:    Labs Reviewed  COMPREHENSIVE METABOLIC PANEL - Abnormal; Notable for the following components:      Result Value   Total Protein 8.8 (*)    AST 64 (*)    Total Bilirubin 3.2 (*)    All other components within normal limits  CBC WITH DIFFERENTIAL/PLATELET - Abnormal; Notable for the following components:   WBC 13.3 (*)    RBC 2.98 (*)    Hemoglobin 10.2 (*)    HCT 29.1 (*)     MCH 34.2 (*)    RDW 21.7 (*)    nRBC 0.9 (*)    Monocytes Absolute 1.7 (*)    All other components within normal limits  RETICULOCYTES - Abnormal; Notable for the following components:   Retic Ct Pct 7.2 (*)    RBC. 2.96 (*)    Retic Count, Absolute 212.5 (*)    Immature Retic Fract 38.8 (*)    All other components within normal limits  CBC WITH DIFFERENTIAL/PLATELET - Abnormal; Notable for the following components:   WBC 18.8 (*)    RBC 2.33 (*)    Hemoglobin 8.0 (*)    HCT 21.9 (*)    MCH 34.3 (*)    MCHC 36.5 (*)    RDW 24.0 (*)    nRBC 1.2 (*)    Neutro Abs 7.8 (*)    Lymphs Abs 7.5 (*)    Monocytes Absolute 2.9 (*)    Abs Immature Granulocytes 0.13 (*)    All other components within normal limits  COMPREHENSIVE METABOLIC PANEL - Abnormal; Notable for the following components:   Calcium 8.8 (*)    AST 60 (*)    Total Bilirubin 4.2 (*)    All other components within normal limits  GASTROINTESTINAL PANEL BY PCR, STOOL (REPLACES STOOL CULTURE)  LIPASE, BLOOD     Hospital Course:  Patient was admitted for sickle cell pain crisis and  managed appropriately with IVF, IV Dilaudid via PCA and IV Toradol, as well as other adjunct therapies per sickle cell pain management protocols.  Patient slowly responded to regimen.  Hemoglobin remained stable at baseline, patient did not require any blood transfusion.  As of today patient is tolerating p.o. intake well, and ambulating well with no significant pain.  He said his pain is now at baseline and he would like to go home since his pain can now be managed with his home medications. Patient was therefore discharged home today in a hemodynamically stable condition.   [name] will follow-up with PCP within 1 week of this discharge. [name] was counseled extensively about nonpharmacologic means of pain management, patient verbalized understanding and was appreciative of  the care received during this admission.   We discussed the need for  good hydration, monitoring of hydration status, avoidance of heat, cold, stress, and infection triggers. We discussed the need to be adherent with taking Hydrea and other home medications. Patient was reminded of the need to seek medical attention immediately if any symptom of bleeding, anemia, or infection occurs.  Discharge Exam: Vitals:   08/30/23 0944 08/30/23 1326  BP: 138/84 131/83  Pulse: 70 71  Resp: 16 16  Temp: 97.9 F (36.6 C) 98.3 F (36.8 C)  SpO2: 91% 94%   Vitals:   08/30/23 0543 08/30/23 0850 08/30/23 0944 08/30/23 1326  BP:   138/84 131/83  Pulse:   70 71  Resp: 14 15 16 16   Temp:   97.9 F (36.6 C) 98.3 F (36.8 C)  TempSrc:   Oral Oral  SpO2:   91% 94%  Weight:      Height:        General appearance : Awake, alert, not in any distress. Speech Clear. Not toxic looking HEENT: Atraumatic and Normocephalic, pupils equally reactive to light and accomodation Neck: Supple, no JVD. No cervical lymphadenopathy.  Chest: Good air entry bilaterally, no added sounds  CVS: S1 S2 regular, no murmurs.  Abdomen: Bowel sounds present, Non tender and not distended with no gaurding, rigidity or rebound. Extremities: B/L Lower Ext shows no edema, both legs are warm to touch Neurology: Awake alert, and oriented X 3, CN II-XII intact, Non focal Skin: No Rash  Discharge Instructions  Discharge Instructions     Call MD for:  difficulty breathing, headache or visual disturbances   Complete by: As directed    Call MD for:  temperature >100.4   Complete by: As directed    Diet - low sodium heart healthy   Complete by: As directed    Increase activity slowly   Complete by: As directed       Allergies as of 08/30/2023   No Known Allergies      Medication List     TAKE these medications    cholecalciferol 25 MCG (1000 UNIT) tablet Commonly known as: VITAMIN D3 Take 1,000 Units by mouth daily.   folic acid 1 MG tablet Commonly known as: FOLVITE Take 1 tablet (1  mg total) by mouth daily.   hydroxyurea 500 MG capsule Commonly known as: HYDREA Take 3 capsules (1,500 mg total) by mouth daily. May take with food to minimize GI side effects.   lisinopril 2.5 MG tablet Commonly known as: ZESTRIL Take 2.5 mg by mouth daily.   naproxen 250 MG tablet Commonly known as: NAPROSYN Take 250 mg by mouth 2 (two) times daily with a meal.   oxyCODONE 5 MG immediate release tablet  Commonly known as: Oxy IR/ROXICODONE Take 5 mg by mouth every 6 (six) hours as needed for severe pain.        The results of significant diagnostics from this hospitalization (including imaging, microbiology, ancillary and laboratory) are listed below for reference.    Significant Diagnostic Studies: No results found.  Microbiology: No results found for this or any previous visit (from the past 240 hours).   Labs: Basic Metabolic Panel: Recent Labs  Lab 08/26/23 1732 08/29/23 0440  NA 138 137  K 4.2 3.8  CL 106 104  CO2 23 24  GLUCOSE 95 86  BUN 6 15  CREATININE 0.91 0.71  CALCIUM 9.3 8.8*   Liver Function Tests: Recent Labs  Lab 08/26/23 1732 08/29/23 0440  AST 64* 60*  ALT 22 25  ALKPHOS 83 78  BILITOT 3.2* 4.2*  PROT 8.8* 7.7  ALBUMIN 4.1 4.0   Recent Labs  Lab 08/26/23 1732  LIPASE 27   No results for input(s): "AMMONIA" in the last 168 hours. CBC: Recent Labs  Lab 08/26/23 1732 08/29/23 0440  WBC 13.3* 18.8*  NEUTROABS 7.4 7.8*  HGB 10.2* 8.0*  HCT 29.1* 21.9*  MCV 97.7 94.0  PLT 369 269   Cardiac Enzymes: No results for input(s): "CKTOTAL", "CKMB", "CKMBINDEX", "TROPONINI" in the last 168 hours. BNP: Invalid input(s): "POCBNP" CBG: No results for input(s): "GLUCAP" in the last 168 hours.  Time coordinating discharge: 50 minutes  Signed:  Daryll Drown NP  [facility] 08/30/2023, 2:44 PM

## 2023-09-14 DIAGNOSIS — D571 Sickle-cell disease without crisis: Secondary | ICD-10-CM | POA: Diagnosis not present

## 2023-09-14 DIAGNOSIS — R809 Proteinuria, unspecified: Secondary | ICD-10-CM | POA: Diagnosis not present

## 2023-09-14 DIAGNOSIS — E559 Vitamin D deficiency, unspecified: Secondary | ICD-10-CM | POA: Diagnosis not present

## 2023-09-14 DIAGNOSIS — Z79899 Other long term (current) drug therapy: Secondary | ICD-10-CM | POA: Diagnosis not present

## 2023-11-06 ENCOUNTER — Other Ambulatory Visit: Payer: Self-pay

## 2023-11-06 ENCOUNTER — Emergency Department (HOSPITAL_BASED_OUTPATIENT_CLINIC_OR_DEPARTMENT_OTHER)
Admission: EM | Admit: 2023-11-06 | Discharge: 2023-11-06 | Disposition: A | Discharge status: Home / Self Care | Attending: Emergency Medicine | Admitting: Emergency Medicine

## 2023-11-06 ENCOUNTER — Encounter (HOSPITAL_BASED_OUTPATIENT_CLINIC_OR_DEPARTMENT_OTHER): Payer: Self-pay | Admitting: Emergency Medicine

## 2023-11-06 ENCOUNTER — Emergency Department (HOSPITAL_BASED_OUTPATIENT_CLINIC_OR_DEPARTMENT_OTHER)

## 2023-11-06 DIAGNOSIS — R109 Unspecified abdominal pain: Secondary | ICD-10-CM | POA: Diagnosis not present

## 2023-11-06 DIAGNOSIS — K8020 Calculus of gallbladder without cholecystitis without obstruction: Secondary | ICD-10-CM | POA: Insufficient documentation

## 2023-11-06 DIAGNOSIS — D5700 Hb-SS disease with crisis, unspecified: Secondary | ICD-10-CM | POA: Insufficient documentation

## 2023-11-06 DIAGNOSIS — R112 Nausea with vomiting, unspecified: Secondary | ICD-10-CM | POA: Diagnosis present

## 2023-11-06 DIAGNOSIS — D649 Anemia, unspecified: Secondary | ICD-10-CM | POA: Diagnosis not present

## 2023-11-06 LAB — COMPREHENSIVE METABOLIC PANEL WITH GFR
ALT: 25 U/L (ref 0–44)
AST: 62 U/L — ABNORMAL HIGH (ref 15–41)
Albumin: 4.8 g/dL (ref 3.5–5.0)
Alkaline Phosphatase: 100 U/L (ref 38–126)
Anion gap: 14 (ref 5–15)
BUN: 12 mg/dL (ref 6–20)
CO2: 22 mmol/L (ref 22–32)
Calcium: 9.7 mg/dL (ref 8.9–10.3)
Chloride: 106 mmol/L (ref 98–111)
Creatinine, Ser: 0.87 mg/dL (ref 0.61–1.24)
GFR, Estimated: >60 mL/min (ref >60)
Glucose, Bld: 97 mg/dL (ref 70–99)
Potassium: 4.2 mmol/L (ref 3.5–5.1)
Sodium: 142 mmol/L (ref 135–145)
Total Bilirubin: 2.6 mg/dL — ABNORMAL HIGH (ref 0.0–1.2)
Total Protein: 9.1 g/dL — ABNORMAL HIGH (ref 6.5–8.1)

## 2023-11-06 LAB — URINALYSIS, MICROSCOPIC (REFLEX)

## 2023-11-06 LAB — RETICULOCYTES
Immature Retic Fract: 41.6 % — ABNORMAL HIGH (ref 2.3–15.9)
RBC.: 2.44 MIL/uL — ABNORMAL LOW (ref 4.22–5.81)
Retic Count, Absolute: 141.5 10*3/uL (ref 19.0–186.0)
Retic Ct Pct: 5.8 % — ABNORMAL HIGH (ref 0.4–3.1)

## 2023-11-06 LAB — CBC WITH DIFFERENTIAL/PLATELET
Abs Immature Granulocytes: 0.08 10*3/uL — ABNORMAL HIGH (ref 0.00–0.07)
Basophils Absolute: 0 10*3/uL (ref 0.0–0.1)
Basophils Relative: 0 %
Eosinophils Absolute: 0 10*3/uL (ref 0.0–0.5)
Eosinophils Relative: 0 %
HCT: 24.3 % — ABNORMAL LOW (ref 39.0–52.0)
Hemoglobin: 8.8 g/dL — ABNORMAL LOW (ref 13.0–17.0)
Immature Granulocytes: 1 %
Lymphocytes Relative: 21 %
Lymphs Abs: 2.5 10*3/uL (ref 0.7–4.0)
MCH: 36.2 pg — ABNORMAL HIGH (ref 26.0–34.0)
MCHC: 36.2 g/dL — ABNORMAL HIGH (ref 30.0–36.0)
MCV: 100 fL (ref 80.0–100.0)
Monocytes Absolute: 1.7 10*3/uL — ABNORMAL HIGH (ref 0.1–1.0)
Monocytes Relative: 15 %
Neutro Abs: 7.2 10*3/uL (ref 1.7–7.7)
Neutrophils Relative %: 63 %
Platelets: 709 10*3/uL — ABNORMAL HIGH (ref 150–400)
RBC: 2.43 MIL/uL — ABNORMAL LOW (ref 4.22–5.81)
RDW: 19.9 % — ABNORMAL HIGH (ref 11.5–15.5)
Smear Review: NORMAL
WBC: 11.5 10*3/uL — ABNORMAL HIGH (ref 4.0–10.5)
nRBC: 1.5 % — ABNORMAL HIGH (ref 0.0–0.2)

## 2023-11-06 LAB — URINALYSIS, ROUTINE W REFLEX MICROSCOPIC
Bilirubin Urine: NEGATIVE
Glucose, UA: NEGATIVE mg/dL
Ketones, ur: NEGATIVE mg/dL
Leukocytes,Ua: NEGATIVE
Nitrite: NEGATIVE
Protein, ur: >=300 mg/dL — AB
Specific Gravity, Urine: 1.02 (ref 1.005–1.030)
pH: 5.5 (ref 5.0–8.0)

## 2023-11-06 LAB — LIPASE, BLOOD: Lipase: 24 U/L (ref 11–51)

## 2023-11-06 MED ORDER — SODIUM CHLORIDE 0.9 % IV BOLUS
1000.0000 mL | Freq: Once | INTRAVENOUS | Status: AC
  Administered 2023-11-06: 1000 mL via INTRAVENOUS

## 2023-11-06 MED ORDER — HYDROMORPHONE HCL 1 MG/ML IJ SOLN
2.0000 mg | Freq: Once | INTRAMUSCULAR | Status: AC
  Administered 2023-11-06: 2 mg via INTRAVENOUS
  Filled 2023-11-06: qty 2

## 2023-11-06 MED ORDER — HYDROMORPHONE HCL 1 MG/ML IJ SOLN
2.0000 mg | INTRAMUSCULAR | Status: AC
  Administered 2023-11-06: 2 mg via INTRAVENOUS
  Filled 2023-11-06: qty 2

## 2023-11-06 MED ORDER — ONDANSETRON 4 MG PO TBDP: ORAL_TABLET | ORAL | 0 refills | Status: DC

## 2023-11-06 MED ORDER — KETOROLAC TROMETHAMINE 15 MG/ML IJ SOLN
15.0000 mg | INTRAMUSCULAR | Status: AC
  Administered 2023-11-06: 15 mg via INTRAVENOUS
  Filled 2023-11-06: qty 1

## 2023-11-06 MED ORDER — ONDANSETRON HCL 4 MG/2ML IJ SOLN
4.0000 mg | INTRAMUSCULAR | Status: DC | PRN
  Administered 2023-11-06: 4 mg via INTRAVENOUS
  Filled 2023-11-06: qty 2

## 2023-11-06 MED ORDER — IOHEXOL 300 MG/ML  SOLN
100.0000 mL | Freq: Once | INTRAMUSCULAR | Status: AC | PRN
  Administered 2023-11-06: 100 mL via INTRAVENOUS

## 2023-11-06 MED ORDER — DIPHENHYDRAMINE HCL 50 MG/ML IJ SOLN
INTRAMUSCULAR | Status: AC
  Administered 2023-11-06: 12.5 mg
  Filled 2023-11-06: qty 1

## 2023-11-06 MED ORDER — SODIUM CHLORIDE 0.9 % IV SOLN
12.5000 mg | Freq: Once | INTRAVENOUS | Status: DC
  Filled 2023-11-06: qty 0.25

## 2023-11-06 NOTE — ED Triage Notes (Signed)
 Pt reports NV x 2 day, "minor" abd discomfort; reports sickle cell pain to lower back

## 2023-11-06 NOTE — ED Notes (Signed)

## 2023-11-06 NOTE — ED Provider Notes (Signed)
 Manuel Mcguire Provider Note   CSN: [id number] Arrival date & time: 11/06/23  1557     History  Chief Complaint  Patient presents with   Emesis    Manuel Mcguire is a 33 y.o. male.  Patient to ED with nausea and vomiting for the past 2 days. No diarrhea or fever. History of sickle cell disease with typical site of pain as low back. He reports the nausea and vomiting his caused his low back to hurt. History of similar symptoms after returning from [place] that required a 5-day admission but no cause of the GI symptoms, including diarrhea at that time, was determined.   The history is provided by the patient. No language interpreter was used.  Emesis      Home Medications Prior to Admission medications   Medication Sig Start Date End Date Taking? Authorizing Provider  cholecalciferol (VITAMIN D3) 25 MCG (1000 UNIT) tablet Take 1,000 Units by mouth daily.    [provider]  folic acid  (FOLVITE ) 1 MG tablet Take 1 tablet (1 mg total) by mouth daily. 10/27/12   Lynnetta Sauce, MD  hydroxyurea  (HYDREA ) 500 MG capsule Take 3 capsules (1,500 mg total) by mouth daily. May take with food to minimize GI side effects. 10/27/12   Lynnetta Sauce, MD  lisinopril  (ZESTRIL ) 2.5 MG tablet Take 2.5 mg by mouth daily.    [provider]  naproxen (NAPROSYN) 250 MG tablet Take 250 mg by mouth 2 (two) times daily with a meal. Patient not taking: Reported on 06/04/2023    [provider]  oxyCODONE  (OXY IR/ROXICODONE ) 5 MG immediate release tablet Take 5 mg by mouth every 6 (six) hours as needed for severe pain.    [provider]      Allergies    Patient has no known allergies.    Review of Systems   Review of Systems  Gastrointestinal:  Positive for vomiting.    Physical Exam Updated Vital Signs BP 123/84   Pulse 88   Temp 97.9 F (36.6 C)   Resp 20   Ht 5\' 7"  (1.702 m)   Wt 72.6 kg   SpO2 100%    BMI 25.06 kg/m  Physical Exam Vitals and nursing note reviewed.  Constitutional:      Appearance: He is well-developed.  HENT:     Head: Normocephalic.  Eyes:     General: No scleral icterus. Cardiovascular:     Rate and Rhythm: Normal rate and regular rhythm.     Heart sounds: No murmur heard. Pulmonary:     Effort: Pulmonary effort is normal.     Breath sounds: Normal breath sounds. No wheezing, rhonchi or rales.  Abdominal:     Palpations: Abdomen is soft.     Tenderness: There is abdominal tenderness (Periumbilical abdominal tenderness.). There is no guarding or rebound.  Musculoskeletal:        General: Normal range of motion.     Cervical back: Normal range of motion and neck supple.  Skin:    General: Skin is warm and dry.  Neurological:     General: No focal deficit present.     Mental Status: He is alert and oriented to person, place, and time.     ED Results / Procedures / Treatments   Labs (all labs ordered are listed, but only abnormal results are displayed) Labs Reviewed  COMPREHENSIVE METABOLIC PANEL WITH GFR - Abnormal; Notable for the following components:  Result Value   Total Protein 9.1 (*)    AST 62 (*)    Total Bilirubin 2.6 (*)    All other components within normal limits  RETICULOCYTES - Abnormal; Notable for the following components:   Retic Ct Pct 5.8 (*)    RBC. 2.44 (*)    Immature Retic Fract 41.6 (*)    All other components within normal limits  LIPASE, BLOOD  URINALYSIS, ROUTINE W REFLEX MICROSCOPIC  CBC WITH DIFFERENTIAL/PLATELET   Results for orders placed or performed during the hospital encounter of 11/06/23  Lipase, blood   Collection Time: 11/06/23  5:19 PM  Result Value Ref Range   Lipase 24 11 - 51 U/L  Comprehensive metabolic panel   Collection Time: 11/06/23  5:19 PM  Result Value Ref Range   Sodium 142 135 - 145 mmol/L   Potassium 4.2 3.5 - 5.1 mmol/L   Chloride 106 98 - 111 mmol/L   CO2 22 22 - 32 mmol/L    Glucose, Bld 97 70 - 99 mg/dL   BUN 12 6 - 20 mg/dL   Creatinine, Ser 2.13 0.61 - 1.24 mg/dL   Calcium 9.7 8.9 - 08.6 mg/dL   Total Protein 9.1 (H) 6.5 - 8.1 g/dL   Albumin 4.8 3.5 - 5.0 g/dL   AST 62 (H) 15 - 41 U/L   ALT 25 0 - 44 U/L   Alkaline Phosphatase 100 38 - 126 U/L   Total Bilirubin 2.6 (H) 0.0 - 1.2 mg/dL   GFR, Estimated >57 >84 mL/min   Anion gap 14 5 - 15  Reticulocytes   Collection Time: 11/06/23  5:19 PM  Result Value Ref Range   Retic Ct Pct 5.8 (H) 0.4 - 3.1 %   RBC. 2.44 (L) 4.22 - 5.81 MIL/uL   Retic Count, Absolute 141.5 19.0 - 186.0 K/uL   Immature Retic Fract 41.6 (H) 2.3 - 15.9 %     EKG None  Radiology No results found.  Procedures Procedures    Medications Ordered in ED Medications  HYDROmorphone  (DILAUDID ) injection 2 mg (has no administration in time range)  ondansetron  (ZOFRAN ) injection 4 mg (4 mg Intravenous Given 11/06/23 1745)  diphenhydrAMINE  (BENADRYL ) 12.5 mg in sodium chloride  0.9 % 50 mL IVPB (12.5 mg Intravenous Not Given 11/06/23 1750)  HYDROmorphone  (DILAUDID ) injection 2 mg (2 mg Intravenous Given 11/06/23 1746)  HYDROmorphone  (DILAUDID ) injection 2 mg (2 mg Intravenous Given 11/06/23 1812)  ketorolac  (TORADOL ) 15 MG/ML injection 15 mg (15 mg Intravenous Given 11/06/23 1746)  sodium chloride  0.9 % bolus 1,000 mL (1,000 mLs Intravenous New Bag/Given 11/06/23 1744)  diphenhydrAMINE  (BENADRYL ) 50 MG/ML injection (12.5 mg  Given 11/06/23 1745)    ED Course/ Medical Decision Making/ A&P                                 Medical Decision Making This patient presents to the ED for concern of vomiting, abdominal pain, this involves an extensive number of treatment options, and is a complaint that carries with it a high risk of complications and morbidity.  The differential diagnosis includes LBO/SBO, colitis, PUD, pancreatitis, cholecystitis.   Co morbidities that complicate the patient evaluation  Sickle cell disease   Additional history  obtained:  Additional history and/or information obtained from chart review, notable for Infrequent ED/hospital visits   Lab Tests:  I Ordered, and personally interpreted labs.  The pertinent results include:  Cmet - lipase normal, AST 62, ALT 25, total bili 2.6 (h/o same) Abs retic count - 141.5    Imaging Studies ordered:  I ordered imaging studies including ct abd/pel I independently visualized and interpreted imaging which showed pending at time of sign out to Dr. Delana Favors I agree with the radiologist interpretation   Cardiac Monitoring:  The patient was maintained on a cardiac monitor.  I personally viewed and interpreted the cardiac monitored which showed an underlying rhythm of: n/a   Medicines ordered and prescription drug management:  I ordered medication including dilaudid , zofran   for pain and nausea Reevaluation of the patient after these medicines showed that the patient improved I have reviewed the patients home medicines and have made adjustments as needed   Test Considered:  N/a   Critical Interventions:  N/a   Consultations Obtained:  I requested consultation with the n/a,  and discussed lab and imaging findings as well as pertinent plan - they recommend: n/a   Problem List / ED Course:  Here with nausea and vomiting x 2 days, exacerbated his sickle cell pain (low back) No fever, or diarrhea History similar symptoms Resulted labs at time of sign out stable (CBC pending) CT abd/pel ordered and pending at time of sign out to Dr. Delana Favors    Reevaluation:  After the interventions noted above, I reevaluated the patient and found that they have :improved   Social Determinants of Health:  N/a   Disposition:  After consideration of the diagnostic results and the patients response to treatment, I feel that the patient would benefit from: Disposition to be determined by oncoming provider team. .   Amount and/or Complexity of Data Reviewed Labs:  ordered. Radiology: ordered.  Risk Prescription drug management.           Final Clinical Impression(s) / ED Diagnoses Final diagnoses:  Nausea and vomiting, unspecified vomiting type  Sickle cell anemia with pain [facility])    Rx / DC Orders ED Discharge Orders     None         Mandy Second, PA-C 11/06/23 1816    Dalene Duck, MD 11/06/23 2040

## 2023-11-06 NOTE — Discharge Instructions (Addendum)
 You likely have a sickle cell pain crisis.  You do have some gallstones but I do not think that is contributing to your pain.  I have prescribed Zofran  as needed for nausea.  You can take your pain medicine as prescribed by your doctor  See your sickle cell doctor for follow-up  Return to ER if you have severe abdominal pain or vomiting, back pain or fever

## 2023-11-16 ENCOUNTER — Inpatient Hospital Stay (HOSPITAL_COMMUNITY)
Admission: EM | Admit: 2023-11-16 | Discharge: 2023-11-18 | DRG: 812 | Disposition: A | Discharge status: Home / Self Care | Attending: Internal Medicine | Admitting: Internal Medicine

## 2023-11-16 ENCOUNTER — Emergency Department (HOSPITAL_COMMUNITY)

## 2023-11-16 ENCOUNTER — Other Ambulatory Visit: Payer: Self-pay

## 2023-11-16 ENCOUNTER — Encounter (HOSPITAL_COMMUNITY): Payer: Self-pay

## 2023-11-16 DIAGNOSIS — Z7964 Long term (current) use of myelosuppressive agent: Secondary | ICD-10-CM

## 2023-11-16 DIAGNOSIS — D638 Anemia in other chronic diseases classified elsewhere: Secondary | ICD-10-CM | POA: Diagnosis present

## 2023-11-16 DIAGNOSIS — G894 Chronic pain syndrome: Secondary | ICD-10-CM | POA: Diagnosis present

## 2023-11-16 DIAGNOSIS — D5700 Hb-SS disease with crisis, unspecified: Principal | ICD-10-CM | POA: Diagnosis present

## 2023-11-16 DIAGNOSIS — D72829 Elevated white blood cell count, unspecified: Secondary | ICD-10-CM | POA: Diagnosis present

## 2023-11-16 DIAGNOSIS — K8020 Calculus of gallbladder without cholecystitis without obstruction: Secondary | ICD-10-CM | POA: Diagnosis not present

## 2023-11-16 DIAGNOSIS — R109 Unspecified abdominal pain: Secondary | ICD-10-CM | POA: Diagnosis not present

## 2023-11-16 DIAGNOSIS — Z79899 Other long term (current) drug therapy: Secondary | ICD-10-CM

## 2023-11-16 DIAGNOSIS — L299 Pruritus, unspecified: Secondary | ICD-10-CM | POA: Diagnosis present

## 2023-11-16 LAB — URINALYSIS, ROUTINE W REFLEX MICROSCOPIC
Bacteria, UA: NONE SEEN
Bilirubin Urine: NEGATIVE
Glucose, UA: NEGATIVE mg/dL
Hgb urine dipstick: NEGATIVE
Ketones, ur: NEGATIVE mg/dL
Leukocytes,Ua: NEGATIVE
Nitrite: NEGATIVE
Protein, ur: 100 mg/dL — AB
Specific Gravity, Urine: 1.034 — ABNORMAL HIGH (ref 1.005–1.030)
pH: 6 (ref 5.0–8.0)

## 2023-11-16 LAB — COMPREHENSIVE METABOLIC PANEL WITH GFR
ALT: 19 U/L (ref 0–44)
AST: 43 U/L — ABNORMAL HIGH (ref 15–41)
Albumin: 4.5 g/dL (ref 3.5–5.0)
Alkaline Phosphatase: 88 U/L (ref 38–126)
Anion gap: 10 (ref 5–15)
BUN: 13 mg/dL (ref 6–20)
CO2: 22 mmol/L (ref 22–32)
Calcium: 9.6 mg/dL (ref 8.9–10.3)
Chloride: 106 mmol/L (ref 98–111)
Creatinine, Ser: 1.08 mg/dL (ref 0.61–1.24)
GFR, Estimated: >60 mL/min (ref >60)
Glucose, Bld: 114 mg/dL — ABNORMAL HIGH (ref 70–99)
Potassium: 3.6 mmol/L (ref 3.5–5.1)
Sodium: 138 mmol/L (ref 135–145)
Total Bilirubin: 2.4 mg/dL — ABNORMAL HIGH (ref 0.0–1.2)
Total Protein: 9 g/dL — ABNORMAL HIGH (ref 6.5–8.1)

## 2023-11-16 LAB — RETICULOCYTES
Immature Retic Fract: 36 % — ABNORMAL HIGH (ref 2.3–15.9)
RBC.: 2.28 MIL/uL — ABNORMAL LOW (ref 4.22–5.81)
Retic Count, Absolute: 114 10*3/uL (ref 19.0–186.0)
Retic Ct Pct: 5 % — ABNORMAL HIGH (ref 0.4–3.1)

## 2023-11-16 LAB — CBC
HCT: 23.5 % — ABNORMAL LOW (ref 39.0–52.0)
Hemoglobin: 8.5 g/dL — ABNORMAL LOW (ref 13.0–17.0)
MCH: 37.1 pg — ABNORMAL HIGH (ref 26.0–34.0)
MCHC: 36.2 g/dL — ABNORMAL HIGH (ref 30.0–36.0)
MCV: 102.6 fL — ABNORMAL HIGH (ref 80.0–100.0)
Platelets: 291 10*3/uL (ref 150–400)
RBC: 2.29 MIL/uL — ABNORMAL LOW (ref 4.22–5.81)
RDW: 20.2 % — ABNORMAL HIGH (ref 11.5–15.5)
WBC: 14.8 10*3/uL — ABNORMAL HIGH (ref 4.0–10.5)
nRBC: 2.1 % — ABNORMAL HIGH (ref 0.0–0.2)

## 2023-11-16 LAB — LIPASE, BLOOD: Lipase: 29 U/L (ref 11–51)

## 2023-11-16 MED ORDER — METOCLOPRAMIDE HCL 5 MG/ML IJ SOLN
10.0000 mg | Freq: Once | INTRAMUSCULAR | Status: AC
  Administered 2023-11-16: 10 mg via INTRAVENOUS
  Filled 2023-11-16: qty 2

## 2023-11-16 MED ORDER — SODIUM CHLORIDE 0.9% FLUSH: 9.0000 mL | INTRAVENOUS | Status: DC | PRN

## 2023-11-16 MED ORDER — HYDROMORPHONE 1 MG/ML IV SOLN
INTRAVENOUS | Status: DC
  Administered 2023-11-16: 7 mg via INTRAVENOUS
  Administered 2023-11-16: 30 mg via INTRAVENOUS
  Administered 2023-11-16: 0.5 mg via INTRAVENOUS
  Administered 2023-11-16: 4 mg via INTRAVENOUS
  Administered 2023-11-17: 30 mg via INTRAVENOUS
  Administered 2023-11-17: 8 mg via INTRAVENOUS
  Administered 2023-11-17: 5.5 mg via INTRAVENOUS
  Administered 2023-11-18: 8 mg via INTRAVENOUS
  Administered 2023-11-18: 30 mg via INTRAVENOUS
  Filled 2023-11-16 (×3): qty 30

## 2023-11-16 MED ORDER — DIPHENHYDRAMINE HCL 25 MG PO CAPS
25.0000 mg | ORAL_CAPSULE | ORAL | Status: DC | PRN
  Administered 2023-11-16 – 2023-11-18 (×4): 25 mg via ORAL
  Filled 2023-11-16 (×4): qty 1

## 2023-11-16 MED ORDER — POLYETHYLENE GLYCOL 3350 17 G PO PACK: 17.0000 g | PACK | Freq: Every day | ORAL | Status: DC | PRN

## 2023-11-16 MED ORDER — KETOROLAC TROMETHAMINE 15 MG/ML IJ SOLN
15.0000 mg | Freq: Four times a day (QID) | INTRAMUSCULAR | Status: DC
  Administered 2023-11-16 – 2023-11-18 (×8): 15 mg via INTRAVENOUS
  Filled 2023-11-16 (×8): qty 1

## 2023-11-16 MED ORDER — HYDROMORPHONE HCL 1 MG/ML IJ SOLN
2.0000 mg | INTRAMUSCULAR | Status: AC
  Administered 2023-11-16: 2 mg via INTRAVENOUS
  Filled 2023-11-16: qty 2

## 2023-11-16 MED ORDER — SODIUM CHLORIDE 0.45 % IV SOLN: INTRAVENOUS | Status: DC

## 2023-11-16 MED ORDER — ENOXAPARIN SODIUM 40 MG/0.4ML IJ SOSY
40.0000 mg | PREFILLED_SYRINGE | INTRAMUSCULAR | Status: DC
  Administered 2023-11-16 – 2023-11-18 (×3): 40 mg via SUBCUTANEOUS
  Filled 2023-11-16 (×3): qty 0.4

## 2023-11-16 MED ORDER — SENNOSIDES-DOCUSATE SODIUM 8.6-50 MG PO TABS
1.0000 | ORAL_TABLET | Freq: Two times a day (BID) | ORAL | Status: DC
  Administered 2023-11-16 – 2023-11-18 (×3): 1 via ORAL
  Filled 2023-11-16 (×4): qty 1

## 2023-11-16 MED ORDER — SODIUM CHLORIDE (PF) 0.9 % IJ SOLN
INTRAMUSCULAR | Status: AC
  Filled 2023-11-16: qty 50

## 2023-11-16 MED ORDER — ONDANSETRON HCL 4 MG/2ML IJ SOLN: 4.0000 mg | Freq: Four times a day (QID) | INTRAMUSCULAR | Status: DC | PRN

## 2023-11-16 MED ORDER — DIPHENHYDRAMINE HCL 50 MG/ML IJ SOLN
25.0000 mg | Freq: Once | INTRAMUSCULAR | Status: AC
  Administered 2023-11-16: 25 mg via INTRAVENOUS
  Filled 2023-11-16: qty 1

## 2023-11-16 MED ORDER — DEXTROSE-SODIUM CHLORIDE 5-0.45 % IV SOLN: INTRAVENOUS | Status: DC

## 2023-11-16 MED ORDER — NALOXONE HCL 0.4 MG/ML IJ SOLN: 0.4000 mg | INTRAMUSCULAR | Status: DC | PRN

## 2023-11-16 MED ORDER — IOHEXOL 300 MG/ML  SOLN
100.0000 mL | Freq: Once | INTRAMUSCULAR | Status: AC | PRN
  Administered 2023-11-16: 100 mL via INTRAVENOUS

## 2023-11-16 NOTE — ED Triage Notes (Signed)
 Pt c/o vomiting for the past day and half and also so lower back pain from his sickle cell. States he had the vomiting last week and it went away and now it is back again.

## 2023-11-16 NOTE — ED Notes (Signed)
 Patient reminded of the urine sample.

## 2023-11-16 NOTE — ED Notes (Signed)
 Patient aware of the need of urine sample-does not have the urge to void at this time. Urine cup and urinal at bedside.

## 2023-11-16 NOTE — ED Provider Notes (Signed)
 [place] [facility] AT [facility] Provider Note   CSN: [id number] Arrival date & time: 11/16/23  4098     History  Chief Complaint  Patient presents with   Emesis   Sickle Cell Pain Crisis    Manuel Mcguire is a 33 y.o. male.  33 yo M with a chief complaints of abdominal pain nausea vomiting.  Feels like this is brought on by sickle cell pain crisis.  Starting having pain in his back.  No fevers or chills.  Was seen in the emergency department about 2 weeks ago for similar.  Had CT imaging that did not show an obvious cause.  Does not feel like it ever really got better.  Denies any recent changes to his medications.   Emesis Sickle Cell Pain Crisis Associated symptoms: vomiting        Home Medications Prior to Admission medications   Medication Sig Start Date End Date Taking? Authorizing Provider  cholecalciferol (VITAMIN D3) 25 MCG (1000 UNIT) tablet Take 1,000 Units by mouth daily.    [provider]  folic acid  (FOLVITE ) 1 MG tablet Take 1 tablet (1 mg total) by mouth daily. 10/27/12   Lynnetta Sauce, MD  hydroxyurea  (HYDREA ) 500 MG capsule Take 3 capsules (1,500 mg total) by mouth daily. May take with food to minimize GI side effects. 10/27/12   Lynnetta Sauce, MD  lisinopril  (ZESTRIL ) 2.5 MG tablet Take 2.5 mg by mouth daily.    [provider]  naproxen (NAPROSYN) 250 MG tablet Take 250 mg by mouth 2 (two) times daily with a meal. Patient not taking: Reported on 06/04/2023    [provider]  ondansetron  (ZOFRAN -ODT) 4 MG disintegrating tablet 4mg  ODT q4 hours prn nausea/vomit 11/06/23   Dalene Duck, MD  oxyCODONE  (OXY IR/ROXICODONE ) 5 MG immediate release tablet Take 5 mg by mouth every 6 (six) hours as needed for severe pain.    [provider]      Allergies    Patient has no known allergies.    Review of Systems   Review of Systems  Gastrointestinal:  Positive for vomiting.     Physical Exam Updated Vital Signs BP 110/78   Pulse 73   Temp 98 F (36.7 C) (Oral)   Resp 17   Ht 5' 7 (1.702 m)   Wt 72.5 kg   SpO2 97%   BMI 25.03 kg/m  Physical Exam Vitals and nursing note reviewed.  Constitutional:      Appearance: He is well-developed.  HENT:     Head: Normocephalic and atraumatic.  Eyes:     Pupils: Pupils are equal, round, and reactive to light.  Neck:     Vascular: No JVD.  Cardiovascular:     Rate and Rhythm: Normal rate and regular rhythm.     Heart sounds: No murmur heard.    No friction rub. No gallop.  Pulmonary:     Effort: No respiratory distress.     Breath sounds: No wheezing.  Abdominal:     General: There is no distension.     Tenderness: There is no abdominal tenderness. There is no guarding or rebound.     Comments: Diffuse abdominal discomfort with some guarding without any obvious focality.  Does have pain in the right upper quadrant without obvious hepatomegaly.  Negative Murphy sign.  Musculoskeletal:        General: Normal range of motion.     Cervical back: Normal range of  motion and neck supple.  Skin:    Coloration: Skin is not pale.     Findings: No rash.  Neurological:     Mental Status: He is alert and oriented to person, place, and time.  Psychiatric:        Behavior: Behavior normal.     ED Results / Procedures / Treatments   Labs (all labs ordered are listed, but only abnormal results are displayed) Labs Reviewed  COMPREHENSIVE METABOLIC PANEL WITH GFR - Abnormal; Notable for the following components:      Result Value   Glucose, Bld 114 (*)    Total Protein 9.0 (*)    AST 43 (*)    Total Bilirubin 2.4 (*)    All other components within normal limits  CBC - Abnormal; Notable for the following components:   WBC 14.8 (*)    RBC 2.29 (*)    Hemoglobin 8.5 (*)    HCT 23.5 (*)    MCV 102.6 (*)    MCH 37.1 (*)    MCHC 36.2 (*)    RDW 20.2 (*)    nRBC 2.1 (*)    All other components within normal  limits  URINALYSIS, ROUTINE W REFLEX MICROSCOPIC - Abnormal; Notable for the following components:   Specific Gravity, Urine 1.034 (*)    Protein, ur 100 (*)    All other components within normal limits  RETICULOCYTES - Abnormal; Notable for the following components:   Retic Ct Pct 5.0 (*)    RBC. 2.28 (*)    Immature Retic Fract 36.0 (*)    All other components within normal limits  LIPASE, BLOOD    EKG None  Radiology CT ABDOMEN PELVIS W CONTRAST Result Date: 11/16/2023 CLINICAL DATA:  Abdominal pain EXAM: CT ABDOMEN AND PELVIS WITHOUT CONTRAST TECHNIQUE: Multidetector CT imaging of the abdomen and pelvis was performed following the standard protocol without IV contrast. RADIATION DOSE REDUCTION: This exam was performed according to the departmental dose-optimization program which includes automated exposure control, adjustment of the mA and/or kV according to patient size and/or use of iterative reconstruction technique. COMPARISON:  CT abdomen and pelvis November 06, 2023 FINDINGS: Lower chest: No infiltrates or consolidations, no pleural effusions Hepatobiliary: No change gallstones previously described on prior CT. No focal hepatic lesions or biliary dilatation. The upper abdomen lower chest significantly limited due to metallic artifact from the thoracolumbar instrumentation. Pancreas: Pancreas normal size. No masses calcifications or inflammatory changes. Spleen: Spleen normal size.  No masses. Adrenals/Urinary Tract: Adrenal glands are normal size. Follow-up recommended. Kidneys are normal. No masses calcifications or hydronephrosis Stomach/Bowel: No small or large bowel obstruction or inflammatory changes. Moderate amount of residual fecal material throughout the colon without obstruction or constipation. Vascular/Lymphatic: No significant vascular findings are present. No enlarged abdominal or pelvic lymph nodes. Reproductive: Prostate is unremarkable. Other: Anterior abdominal wall  unremarkable without evidence of umbilical or inguinal hernias Musculoskeletal: Visualized portion of the thoracolumbar spine and pelvic structures grossly unremarkable without evidence of fracture bony abnormalities or soft tissue masses. IMPRESSION: *No acute findings in the abdomen or pelvis. *Cholelithiasis. *Moderate amount of residual fecal material throughout the colon without obstruction or constipation. Electronically Signed   By: Fredrich Jefferson M.D.   On: 11/16/2023 11:52    Procedures .Critical Care  Performed by: Albertus Hughs, DO Authorized by: Albertus Hughs, DO   Critical care provider statement:    Critical care time (minutes):  35   Critical care time was exclusive of:  Separately  billable procedures and treating other patients   Critical care was time spent personally by me on the following activities:  Development of treatment plan with patient or surrogate, discussions with consultants, evaluation of patient's response to treatment, examination of patient, ordering and review of laboratory studies, ordering and review of radiographic studies, ordering and performing treatments and interventions, pulse oximetry, re-evaluation of patient's condition and review of old charts   Care discussed with: admitting provider       Medications Ordered in ED Medications  dextrose  5 % and 0.45 % NaCl infusion ( Intravenous New Bag/Given 11/16/23 1029)  HYDROmorphone  (DILAUDID ) injection 2 mg (2 mg Intravenous Given 11/16/23 1022)  HYDROmorphone  (DILAUDID ) injection 2 mg (2 mg Intravenous Given 11/16/23 1139)  HYDROmorphone  (DILAUDID ) injection 2 mg (2 mg Intravenous Given 11/16/23 1223)  metoCLOPramide (REGLAN) injection 10 mg (10 mg Intravenous Given 11/16/23 1021)  diphenhydrAMINE  (BENADRYL ) injection 25 mg (25 mg Intravenous Given 11/16/23 1021)  iohexol  (OMNIPAQUE ) 300 MG/ML solution 100 mL (100 mLs Intravenous Contrast Given 11/16/23 1121)    ED Course/ Medical Decision Making/ A&P                                  Medical Decision Making Amount and/or Complexity of Data Reviewed Labs: ordered. Radiology: ordered.  Risk Prescription drug management.   33 yo M with a chief complaints of abdominal pain nausea vomiting.  He thinks that has caused him to have a sickle cell pain crisis as well.  He was seen about 10 days ago for similar.  At that point he felt better and was able to go home.  He feels like things are progressively worsened.  Having trouble keeping things down at home.  Will treat pain and nausea aggressively here.  IV fluids.  Work.  Likely repeat CT imaging.  Patient with 3 doses of IV narcotics without significant improvement of his pain.  LFTs and lipase are unremarkable.  Mild leukocytosis.  No signs of aplastic crisis.  CT imaging without obvious acute finding.  Will discuss with Sickle cell for admission.  The patients results and plan were reviewed and discussed.   Any x-rays performed were independently reviewed by myself.   Differential diagnosis were considered with the presenting HPI.  Medications  dextrose  5 % and 0.45 % NaCl infusion ( Intravenous New Bag/Given 11/16/23 1029)  HYDROmorphone  (DILAUDID ) injection 2 mg (2 mg Intravenous Given 11/16/23 1022)  HYDROmorphone  (DILAUDID ) injection 2 mg (2 mg Intravenous Given 11/16/23 1139)  HYDROmorphone  (DILAUDID ) injection 2 mg (2 mg Intravenous Given 11/16/23 1223)  metoCLOPramide (REGLAN) injection 10 mg (10 mg Intravenous Given 11/16/23 1021)  diphenhydrAMINE  (BENADRYL ) injection 25 mg (25 mg Intravenous Given 11/16/23 1021)  iohexol  (OMNIPAQUE ) 300 MG/ML solution 100 mL (100 mLs Intravenous Contrast Given 11/16/23 1121)    Vitals:   11/16/23 1045 11/16/23 1100 11/16/23 1115 11/16/23 1145  BP: 122/76 122/77 129/79 110/78  Pulse: 77 71 76 73  Resp: 18 20 15 17   Temp:      TempSrc:      SpO2: 98% 98% 99% 97%  Weight:      Height:        Final diagnoses:  Sickle cell pain crisis (HCC)     Admission/ observation were discussed with the admitting physician, patient and/or family and they are comfortable with the plan.          Final Clinical Impression(s) / ED  Diagnoses Final diagnoses:  Sickle cell pain crisis [facility])    Rx / DC Orders ED Discharge Orders     None         Albertus Hughs, DO 11/16/23 1251

## 2023-11-16 NOTE — ED Notes (Signed)
 Report given to [place], [place]

## 2023-11-16 NOTE — H&P (Cosign Needed Addendum)
 H&P  Patient Demographics:  Manuel Mcguire, is a 33 y.o. male  MRN: [id number]   DOB - [birth year]  Admit Date - 11/16/2023  Outpatient Primary MD for the patient is Pcp, No  Chief Complaint  Patient presents with   Emesis   Sickle Cell Pain Crisis      HPI:   Manuel Mcguire  is a 33 y.o. male with medical history significant of Sickle cell anemia who presented to the ED with body pain, with worse pain in his lower back. He took his home pain medications without relief so he presented to the ER. patient abdominal CT imaging did not show any obvious cause. Patient was seen in the ED about 2 weeks ago for similar symptoms and did not feel like his pain was fully resolved. He denies, nausea, vomiting, chest pain, cough and urinary symptoms. No sick contacts or recent travels.   ED Course:  Patient was treated in the emergency department with IV fluid, IV dilaudid  with no resolution to pain symptoms. Patient is admitted in-patient for ongoing sickle cell pain management.  BP 110/78  Pulse 73  Temp 98 F (36.7 C) (Oral)  Resp 17  Ht 5' 7 (1.702 m)  Wt 72.5 kg  SpO2 97%  BMI 25.03 kg/m  Labs Reviewed  COMPREHENSIVE METABOLIC PANEL WITH GFR - Abnormal; Notable for the following components:      Result Value   Glucose, Bld 114 (*)    Total Protein 9.0 (*)    AST 43 (*)    Total Bilirubin 2.4 (*)    All other components within normal limits  CBC - Abnormal; Notable for the following components:   WBC 14.8 (*)    RBC 2.29 (*)    Hemoglobin 8.5 (*)    HCT 23.5 (*)    MCV 102.6 (*)    MCH 37.1 (*)    MCHC 36.2 (*)    RDW 20.2 (*)    nRBC 2.1 (*)    All other components within normal limits  URINALYSIS, ROUTINE W REFLEX MICROSCOPIC - Abnormal; Notable for the following components:   Specific Gravity, Urine 1.034 (*)    Protein, ur 100 (*)    All other components within normal limits  RETICULOCYTES - Abnormal; Notable for the following components:   Retic Ct Pct 5.0 (*)     RBC. 2.28 (*)    Immature Retic Fract 36.0 (*)    All other components within normal limits  CBC - Abnormal; Notable for the following components:   WBC 12.9 (*)    RBC 2.01 (*)    Hemoglobin 7.5 (*)    HCT 21.6 (*)    MCV 107.5 (*)    MCH 37.3 (*)    RDW 20.1 (*)    nRBC 3.4 (*)    All other components within normal limits  CBC - Abnormal; Notable for the following components:   WBC 14.6 (*)    RBC 1.93 (*)    Hemoglobin 7.4 (*)    HCT 20.7 (*)    MCV 107.3 (*)    MCH 38.3 (*)    RDW 21.1 (*)    nRBC 3.6 (*)    All other components within normal limits  LIPASE, BLOOD      Review of systems:  In addition to the HPI above, patient reports No fever or chills No Headache, No changes with vision or hearing No problems swallowing food or liquids No chest pain, cough or shortness of breath  No abdominal pain, No nausea or vomiting, Bowel movements are regular No blood in stool or urine No dysuria No new skin rashes or bruises No new weakness, tingling, numbness in any extremity No recent weight gain or loss No polyuria, polydypsia or polyphagia No significant Mental Stressors  A full 10 point Review of Systems was done, except as stated above, all other Review of Systems were negative.  With Past History of the following :   Past Medical History:  Diagnosis Date   Scoliosis    Sickle cell anemia with pain (HCC)       History reviewed. No pertinent surgical history.   Social History:   Social History   Tobacco Use   Smoking status: Never   Smokeless tobacco: Not on file  Substance Use Topics   Alcohol use: No     Lives - At home   Family History :   Family History  Problem Relation Age of Onset   Diabetes Mother    Hypertension Mother    Sickle cell anemia Sister      Home Medications:   Prior to Admission medications   Medication Sig Start Date End Date Taking? Authorizing Provider  cholecalciferol  (VITAMIN D3) 25 MCG (1000 UNIT) tablet Take  1,000 Units by mouth daily.    [provider]  folic acid  (FOLVITE ) 1 MG tablet Take 1 tablet (1 mg total) by mouth daily. 10/27/12   Lynnetta Sauce, MD  hydroxyurea  (HYDREA ) 500 MG capsule Take 3 capsules (1,500 mg total) by mouth daily. May take with food to minimize GI side effects. 10/27/12   Lynnetta Sauce, MD  lisinopril  (ZESTRIL ) 2.5 MG tablet Take 2.5 mg by mouth daily.    [provider]  naproxen (NAPROSYN) 250 MG tablet Take 250 mg by mouth 2 (two) times daily with a meal. Patient not taking: Reported on 06/04/2023    [provider]  ondansetron  (ZOFRAN -ODT) 4 MG disintegrating tablet 4mg  ODT q4 hours prn nausea/vomit 11/06/23   Dalene Duck, MD  oxyCODONE  (OXY IR/ROXICODONE ) 5 MG immediate release tablet Take 5 mg by mouth every 6 (six) hours as needed for severe pain.    [provider]     Allergies:   No Known Allergies   Physical Exam:   Vitals:   Vitals:   11/18/23 0827 11/18/23 0926  BP:  134/77  Pulse:  73  Resp: 17 20  Temp:  98.3 F (36.8 C)  SpO2:  97%    Physical Exam: Constitutional: Patient appears well-developed and well-nourished. Not in obvious distress. HENT: Normocephalic, atraumatic, External right and left ear normal. Oropharynx is clear and moist.  Eyes: Conjunctivae and EOM are normal. PERRLA, no scleral icterus. Neck: Normal ROM. Neck supple. No JVD. No tracheal deviation. No thyromegaly. CVS: RRR, S1/S2 +, no murmurs, no gallops, no carotid bruit.  Pulmonary: Effort and breath sounds normal, no stridor, rhonchi, wheezes, rales.  Abdominal: Soft. BS +, no distension, tenderness, rebound or guarding.  Musculoskeletal: Lower back tenderness Lymphadenopathy: No lymphadenopathy noted, cervical, inguinal or axillary Neuro: Alert. Normal reflexes, muscle tone coordination. No cranial nerve deficit. Skin: Bleeding rash from excessive scratching (Pruritus) Psychiatric: Normal mood and affect.  Behavior, judgment, thought content normal.   Data Review:   CBC Recent Labs  Lab 11/16/23 1017 11/17/23 0642 11/18/23 0518  WBC 14.8* 12.9* 14.6*  HGB 8.5* 7.5* 7.4*  HCT 23.5* 21.6* 20.7*  PLT 291 215 176  MCV 102.6* 107.5* 107.3*  MCH 37.1*  37.3* 38.3*  MCHC 36.2* 34.7 35.7  RDW 20.2* 20.1* 21.1*   ------------------------------------------------------------------------------------------------------------------  Chemistries  Recent Labs  Lab 11/16/23 1017  NA 138  K 3.6  CL 106  CO2 22  GLUCOSE 114*  BUN 13  CREATININE 1.08  CALCIUM 9.6  AST 43*  ALT 19  ALKPHOS 88  BILITOT 2.4*   ------------------------------------------------------------------------------------------------------------------ estimated creatinine clearance is 90.1 mL/min (by C-G formula based on SCr of 1.08 mg/dL). ------------------------------------------------------------------------------------------------------------------ No results for input(s): TSH, T4TOTAL, T3FREE, THYROIDAB in the last 72 hours.  Invalid input(s): FREET3  Coagulation profile No results for input(s): INR, PROTIME in the last 168 hours. ------------------------------------------------------------------------------------------------------------------- No results for input(s): DDIMER in the last 72 hours. -------------------------------------------------------------------------------------------------------------------  Cardiac Enzymes No results for input(s): CKMB, TROPONINI, MYOGLOBIN in the last 168 hours.  Invalid input(s): CK ------------------------------------------------------------------------------------------------------------------ No results found for: BNP  ---------------------------------------------------------------------------------------------------------------  Urinalysis    Component Value Date/Time   COLORURINE YELLOW 11/16/2023 1229   APPEARANCEUR CLEAR  11/16/2023 1229   LABSPEC 1.034 (H) 11/16/2023 1229   PHURINE 6.0 11/16/2023 1229   GLUCOSEU NEGATIVE 11/16/2023 1229   HGBUR NEGATIVE 11/16/2023 1229   BILIRUBINUR NEGATIVE 11/16/2023 1229   KETONESUR NEGATIVE 11/16/2023 1229   PROTEINUR 100 (A) 11/16/2023 1229   UROBILINOGEN 1.0 10/27/2010 0630   NITRITE NEGATIVE 11/16/2023 1229   LEUKOCYTESUR NEGATIVE 11/16/2023 1229    ----------------------------------------------------------------------------------------------------------------   Imaging Results:    No results found.    Assessment & Plan:  Principal Problem:   Sickle cell anemia with pain (HCC) Active Problems:   Anemia of chronic disease   Leukocytosis   Chronic pain syndrome   Pruritus   Hb Sickle Cell Disease with crisis: Admit patient, start IVF 0.45% Saline @ 125 mls/hour, start weight based Dilaudid  PCA, start IV Toradol  15 mg Q 6 H, Restart oral home pain medications, Monitor vitals very closely, Re-evaluate pain scale regularly, 2 L of Oxygen by [place], Patient will be re-evaluated for pain in the context of function and relationship to baseline as care progresses. Leukocytosis: slightly elevated, patient has no acute signs or symptoms of infection, will continue to monitor. Anemia of Chronic Disease: Hemoglobin at 8.5 g/dl slightly below patients baseline, will not transfuse at this time, will continue to monitor daily cbc and transfuse when appropriate.  Chronic pain Syndrome: continue oral home pain medication Pruritus: PO benadryl , Keep skin moisturized   DVT Prophylaxis: Subcut Lovenox    AM Labs Ordered, also please review Full Orders  Family Communication: Admission, patient's condition and plan of care including tests being ordered have been discussed with the patient who indicate understanding and agree with the plan and Code Status.  Code Status: Full Code  Consults called: None    Admission status: Inpatient    Time spent in minutes : 50  minutes  Lorel Roes NP  11/18/2023 at 2:12 PM

## 2023-11-17 DIAGNOSIS — L299 Pruritus, unspecified: Secondary | ICD-10-CM | POA: Diagnosis not present

## 2023-11-17 DIAGNOSIS — D5700 Hb-SS disease with crisis, unspecified: Secondary | ICD-10-CM | POA: Diagnosis not present

## 2023-11-17 DIAGNOSIS — Z79899 Other long term (current) drug therapy: Secondary | ICD-10-CM | POA: Diagnosis not present

## 2023-11-17 DIAGNOSIS — G894 Chronic pain syndrome: Secondary | ICD-10-CM | POA: Diagnosis not present

## 2023-11-17 DIAGNOSIS — D638 Anemia in other chronic diseases classified elsewhere: Secondary | ICD-10-CM | POA: Diagnosis not present

## 2023-11-17 DIAGNOSIS — Z7964 Long term (current) use of myelosuppressive agent: Secondary | ICD-10-CM | POA: Diagnosis not present

## 2023-11-17 DIAGNOSIS — K8020 Calculus of gallbladder without cholecystitis without obstruction: Secondary | ICD-10-CM | POA: Diagnosis not present

## 2023-11-17 DIAGNOSIS — R109 Unspecified abdominal pain: Secondary | ICD-10-CM | POA: Diagnosis not present

## 2023-11-17 DIAGNOSIS — D72829 Elevated white blood cell count, unspecified: Secondary | ICD-10-CM | POA: Diagnosis not present

## 2023-11-17 LAB — CBC
HCT: 21.6 % — ABNORMAL LOW (ref 39.0–52.0)
Hemoglobin: 7.5 g/dL — ABNORMAL LOW (ref 13.0–17.0)
MCH: 37.3 pg — ABNORMAL HIGH (ref 26.0–34.0)
MCHC: 34.7 g/dL (ref 30.0–36.0)
MCV: 107.5 fL — ABNORMAL HIGH (ref 80.0–100.0)
Platelets: 215 10*3/uL (ref 150–400)
RBC: 2.01 MIL/uL — ABNORMAL LOW (ref 4.22–5.81)
RDW: 20.1 % — ABNORMAL HIGH (ref 11.5–15.5)
WBC: 12.9 10*3/uL — ABNORMAL HIGH (ref 4.0–10.5)
nRBC: 3.4 % — ABNORMAL HIGH (ref 0.0–0.2)

## 2023-11-17 MED ORDER — FOLIC ACID 1 MG PO TABS
1.0000 mg | ORAL_TABLET | Freq: Every day | ORAL | Status: DC
  Administered 2023-11-17 – 2023-11-18 (×2): 1 mg via ORAL
  Filled 2023-11-17 (×2): qty 1

## 2023-11-17 MED ORDER — HYDROXYUREA 500 MG PO CAPS
1500.0000 mg | ORAL_CAPSULE | Freq: Every day | ORAL | Status: DC
  Administered 2023-11-17 – 2023-11-18 (×2): 1500 mg via ORAL
  Filled 2023-11-17 (×2): qty 3

## 2023-11-17 MED ORDER — LISINOPRIL 5 MG PO TABS
2.5000 mg | ORAL_TABLET | Freq: Every day | ORAL | Status: DC
  Administered 2023-11-17 – 2023-11-18 (×2): 2.5 mg via ORAL
  Filled 2023-11-17 (×2): qty 1

## 2023-11-17 MED ORDER — VITAMIN D 25 MCG (1000 UNIT) PO TABS
1000.0000 [IU] | ORAL_TABLET | Freq: Every day | ORAL | Status: DC
  Administered 2023-11-17 – 2023-11-18 (×2): 1000 [IU] via ORAL
  Filled 2023-11-17 (×2): qty 1

## 2023-11-17 MED ORDER — OXYCODONE HCL 5 MG PO TABS
5.0000 mg | ORAL_TABLET | Freq: Four times a day (QID) | ORAL | Status: DC | PRN
  Administered 2023-11-17 – 2023-11-18 (×2): 5 mg via ORAL
  Filled 2023-11-17 (×2): qty 1

## 2023-11-17 NOTE — Plan of Care (Signed)
   Problem: Education: Goal: Knowledge of vaso-occlusive preventative measures will improve Outcome: Progressing Goal: Awareness of infection prevention will improve Outcome: Progressing Goal: Awareness of signs and symptoms of anemia will improve Outcome: Progressing Goal: Long-term complications will improve Outcome: Progressing

## 2023-11-17 NOTE — Progress Notes (Signed)
 Patient ID: Manuel Mcguire, male   DOB: [birth year], 33 y.o.   MRN: [id number] Subjective: Manuel Mcguire is a 32 year old male with a medical history significant for sickle cell disease was admitted for sickle cell pain crisis.   Patient is feeling much better this morning, he said his pain is at about 5/10.  His abdominal pain has resolved.  His nausea also resolved.  He is eating and drinking well this morning with no restrictions.  He denies any fever, cough, chest pain, shortness of breath, nausea, vomiting or diarrhea.  No urinary symptoms.  He thinks he needs 1 more night in the hospital however.  Objective:  Vital signs in last 24 hours:  Vitals:   11/17/23 0539 11/17/23 0847 11/17/23 1110 11/17/23 1338  BP: 118/79  (!) 128/93   Pulse: 64  65   Resp: 18 13 18 16   Temp: 98.7 F (37.1 C)  98.6 F (37 C)   TempSrc: Oral  Oral   SpO2: 97% 96% 98% 99%  Weight:      Height:        Intake/Output from previous day:   Intake/Output Summary (Last 24 hours) at 11/17/2023 1354 Last data filed at 11/17/2023 1112 Gross per 24 hour  Intake 1587.23 ml  Output 1400 ml  Net 187.23 ml    Physical Exam: General: Alert, awake, oriented x3, in no acute distress.  HEENT: [place]/AT [place]ER[place], EOMI Neck: Trachea midline,  no masses, no thyromegal,y no JVD, no carotid bruit OROPHARYNX:  Moist, No exudate/ erythema/lesions.  Heart: Regular rate and rhythm, without murmurs, rubs, gallops, PMI non-displaced, no heaves or thrills on palpation.  Lungs: Clear to auscultation, no wheezing or rhonchi noted. No increased vocal fremitus resonant to percussion  Abdomen: Soft, nontender, nondistended, positive bowel sounds, no masses no hepatosplenomegaly noted..  Negative Murphy's sign. Neuro: No focal neurological deficits noted cranial nerves II through XII grossly intact. DTRs 2+ bilaterally upper and lower extremities. Strength 5 out of 5 in bilateral upper and lower extremities. Musculoskeletal: No warm  swelling or erythema around joints, no spinal tenderness noted. Psychiatric: Patient alert and oriented x3, good insight and cognition, good recent to remote recall. Lymph node survey: No cervical axillary or inguinal lymphadenopathy noted.  Lab Results:  Basic Metabolic Panel:    Component Value Date/Time   NA 138 11/16/2023 1017   K 3.6 11/16/2023 1017   CL 106 11/16/2023 1017   CO2 22 11/16/2023 1017   BUN 13 11/16/2023 1017   CREATININE 1.08 11/16/2023 1017   GLUCOSE 114 (H) 11/16/2023 1017   CALCIUM 9.6 11/16/2023 1017   CBC:    Component Value Date/Time   WBC 12.9 (H) 11/17/2023 0642   HGB 7.5 (L) 11/17/2023 0642   HCT 21.6 (L) 11/17/2023 0642   PLT 215 11/17/2023 0642   MCV 107.5 (H) 11/17/2023 0642   NEUTROABS 7.2 11/06/2023 1719   LYMPHSABS 2.5 11/06/2023 1719   MONOABS 1.7 (H) 11/06/2023 1719   EOSABS 0.0 11/06/2023 1719   BASOSABS 0.0 11/06/2023 1719    No results found for this or any previous visit (from the past 240 hours).  Studies/Results: CT ABDOMEN PELVIS W CONTRAST Result Date: 11/16/2023 CLINICAL DATA:  Abdominal pain EXAM: CT ABDOMEN AND PELVIS WITHOUT CONTRAST TECHNIQUE: Multidetector CT imaging of the abdomen and pelvis was performed following the standard protocol without IV contrast. RADIATION DOSE REDUCTION: This exam was performed according to the departmental dose-optimization program which includes automated exposure control, adjustment of the  mA and/or kV according to patient size and/or use of iterative reconstruction technique. COMPARISON:  CT abdomen and pelvis November 06, 2023 FINDINGS: Lower chest: No infiltrates or consolidations, no pleural effusions Hepatobiliary: No change gallstones previously described on prior CT. No focal hepatic lesions or biliary dilatation. The upper abdomen lower chest significantly limited due to metallic artifact from the thoracolumbar instrumentation. Pancreas: Pancreas normal size. No masses calcifications or  inflammatory changes. Spleen: Spleen normal size.  No masses. Adrenals/Urinary Tract: Adrenal glands are normal size. Follow-up recommended. Kidneys are normal. No masses calcifications or hydronephrosis Stomach/Bowel: No small or large bowel obstruction or inflammatory changes. Moderate amount of residual fecal material throughout the colon without obstruction or constipation. Vascular/Lymphatic: No significant vascular findings are present. No enlarged abdominal or pelvic lymph nodes. Reproductive: Prostate is unremarkable. Other: Anterior abdominal wall unremarkable without evidence of umbilical or inguinal hernias Musculoskeletal: Visualized portion of the thoracolumbar spine and pelvic structures grossly unremarkable without evidence of fracture bony abnormalities or soft tissue masses. IMPRESSION: *No acute findings in the abdomen or pelvis. *Cholelithiasis. *Moderate amount of residual fecal material throughout the colon without obstruction or constipation. Electronically Signed   By: Fredrich Jefferson M.D.   On: 11/16/2023 11:52    Medications: Scheduled Meds:  cholecalciferol  1,000 Units Oral Daily   enoxaparin  (LOVENOX ) injection  40 mg Subcutaneous Q24H   folic acid   1 mg Oral Daily   HYDROmorphone    Intravenous Q4H   hydroxyurea   1,500 mg Oral Daily   ketorolac   15 mg Intravenous Q6H   lisinopril   2.5 mg Oral Daily   senna-docusate  1 tablet Oral BID   Continuous Infusions: PRN Meds:.diphenhydrAMINE , naloxone  **AND** sodium chloride  flush, ondansetron  (ZOFRAN ) IV, oxyCODONE , polyethylene glycol  Consultants: None  Procedures: None  Antibiotics: None  Assessment/Plan: Principal Problem:   Sickle cell anemia with pain (HCC) Active Problems:   Anemia of chronic disease   Leukocytosis   Chronic pain syndrome  Hb Sickle Cell Disease with Pain crisis: Reduce IV fluid to KVO.  Continue weight based Dilaudid  PCA at current dose setting, continue IV Toradol  15 mg Q 6 H for a total  of 5 days, continue oral home pain medications as ordered. Monitor vitals very closely, Re-evaluate pain scale regularly, 2 L of Oxygen by [place]. Patient encouraged to ambulate on the hallway today.  Leukocytosis: Most likely due to demargination from vaso-occlusive crisis.  Will continue to monitor very closely without antibiotics. Anemia of Chronic Disease: Hemoglobin is 7.5 today, was 8.5 yesterday.  Not at the threshold for blood transfusion.  Will continue to monitor closely and transfuse as appropriate. Chronic pain Syndrome: Continue oral home pain medications.  Code Status: Full Code Family Communication: N/A Disposition Plan: For possible discharge home tomorrow morning, 11/18/2023.  [place]  If 7PM-7AM, please contact night-coverage.  11/17/2023, 1:54 PM  LOS: 0 days

## 2023-11-17 NOTE — Progress Notes (Signed)
   11/17/23 1117  TOC Brief Assessment  Insurance and Status Reviewed  Patient has primary care physician Yes (PCP not listed)  Home environment has been reviewed single family home  Prior level of function: independent  Prior/Current Home Services No current home services  Social Drivers of Health Review SDOH reviewed no interventions necessary  Readmission risk has been reviewed Yes  Transition of care needs no transition of care needs at this time    [place], MSW, LCSW 11/17/2023 11:18 AM

## 2023-11-17 NOTE — Plan of Care (Signed)

## 2023-11-17 NOTE — Plan of Care (Signed)
  Problem: Education: Goal: Knowledge of vaso-occlusive preventative measures will improve Outcome: Progressing Goal: Long-term complications will improve Outcome: Progressing   Problem: Bowel/Gastric: Goal: Gut motility will be maintained Outcome: Progressing   Problem: Tissue Perfusion: Goal: Complications related to inadequate tissue perfusion will be avoided or minimized Outcome: Progressing   Problem: Pain Managment: Goal: General experience of comfort will improve and/or be controlled Outcome: Progressing

## 2023-11-18 DIAGNOSIS — L299 Pruritus, unspecified: Secondary | ICD-10-CM | POA: Diagnosis present

## 2023-11-18 LAB — CBC
HCT: 20.7 % — ABNORMAL LOW (ref 39.0–52.0)
Hemoglobin: 7.4 g/dL — ABNORMAL LOW (ref 13.0–17.0)
MCH: 38.3 pg — ABNORMAL HIGH (ref 26.0–34.0)
MCHC: 35.7 g/dL (ref 30.0–36.0)
MCV: 107.3 fL — ABNORMAL HIGH (ref 80.0–100.0)
Platelets: 176 10*3/uL (ref 150–400)
RBC: 1.93 MIL/uL — ABNORMAL LOW (ref 4.22–5.81)
RDW: 21.1 % — ABNORMAL HIGH (ref 11.5–15.5)
WBC: 14.6 10*3/uL — ABNORMAL HIGH (ref 4.0–10.5)
nRBC: 3.6 % — ABNORMAL HIGH (ref 0.0–0.2)

## 2023-11-18 NOTE — Discharge Summary (Signed)
 Physician Discharge Summary  [name]:[id number] DOB: 1991-03-13 DOA: 11/16/2023  PCP: Pcp, No  Admit date: 11/16/2023  Discharge date: 11/18/2023  Discharge Diagnoses:  Principal Problem:   Sickle cell anemia with pain (HCC) Active Problems:   Anemia of chronic disease   Leukocytosis   Chronic pain syndrome   Pruritus   Discharge Condition: Stable  Disposition:  Pt is discharged home in good condition and is to follow up with Pcp, No this week to have labs evaluated. Manuel Mcguire is instructed to increase activity slowly and balance with rest for the next few days, and use prescribed medication to complete treatment of pain  Diet: Regular Wt Readings from Last 3 Encounters:  11/16/23 72.5 kg  11/06/23 72.6 kg  08/26/23 72.6 kg    History of present illness:  Manuel Mcguire  is a 33 y.o. male with medical history significant of Sickle cell anemia who presented to the ED with body pain, with worse pain in his lower back. He took his home pain medications without relief so he presented to the ER. patient abdominal CT imaging did not show any obvious cause. Patient was seen in the ED about 2 weeks ago for similar symptoms and did not feel like his pain was fully resolved. He denies, nausea, vomiting, chest pain, cough and urinary symptoms. No sick contacts or recent travels.    ED Course:  Patient was treated in the emergency department with IV fluid, IV dilaudid  with no resolution to pain symptoms. Patient is admitted in-patient for ongoing sickle cell pain management.  BP 110/78  Pulse 73  Temp 98 F (36.7 C) (Oral)  Resp 17  Ht 5' 7 (1.702 m)  Wt 72.5 kg  SpO2 97%  BMI 25.03 kg/m       Labs Reviewed  COMPREHENSIVE METABOLIC PANEL WITH GFR - Abnormal; Notable for the following components:      Result Value     Glucose, Bld 114 (*)      Total Protein 9.0 (*)      AST 43 (*)      Total Bilirubin 2.4 (*)      All other components within normal limits   CBC - Abnormal; Notable for the following components:    WBC 14.8 (*)      RBC 2.29 (*)      Hemoglobin 8.5 (*)      HCT 23.5 (*)      MCV 102.6 (*)      MCH 37.1 (*)      MCHC 36.2 (*)      RDW 20.2 (*)      nRBC 2.1 (*)      All other components within normal limits  URINALYSIS, ROUTINE W REFLEX MICROSCOPIC - Abnormal; Notable for the following components:    Specific Gravity, Urine 1.034 (*)      Protein, ur 100 (*)      All other components within normal limits  RETICULOCYTES - Abnormal; Notable for the following components:    Retic Ct Pct 5.0 (*)      RBC. 2.28 (*)      Immature Retic Fract 36.0 (*)      All other components within normal limits  CBC - Abnormal; Notable for the following components:    WBC 12.9 (*)      RBC 2.01 (*)      Hemoglobin 7.5 (*)      HCT 21.6 (*)      MCV 107.5 (*)  MCH 37.3 (*)      RDW 20.1 (*)      nRBC 3.4 (*)      All other components within normal limits  CBC - Abnormal; Notable for the following components:    WBC 14.6 (*)      RBC 1.93 (*)      Hemoglobin 7.4 (*)      HCT 20.7 (*)      MCV 107.3 (*)      MCH 38.3 (*)      RDW 21.1 (*)      nRBC 3.6 (*)      All other components within normal limits    Hospital Course:  Patient was admitted for sickle cell pain crisis and managed appropriately with IVF, IV Dilaudid  via PCA and IV Toradol , as well as other adjunct therapies per sickle cell pain management protocols. Patient is reporting significant improvement to pain, ambulating without assistance and tolerating PO without nausea or vomiting. Abdominal pain resolved.  Patient was therefore discharged home today in a hemodynamically stable condition.   Manuel Mcguire was counseled extensively about nonpharmacologic means of pain management, patient verbalized understanding and was appreciative of  the care received during this admission.   We discussed the need for good hydration, monitoring of hydration status, avoidance of heat,  cold, stress, and infection triggers. We discussed the need to be adherent with taking other home medications. Patient was reminded of the need to seek medical attention immediately if any symptom of bleeding, anemia, or infection occurs.  Discharge Exam: Vitals:   11/18/23 0827 11/18/23 0926  BP:  134/77  Pulse:  73  Resp: 17 20  Temp:  98.3 F (36.8 C)  SpO2:  97%   Vitals:   11/18/23 0246 11/18/23 0619 11/18/23 0827 11/18/23 0926  BP:  138/88  134/77  Pulse:  63  73  Resp: 15 17 17 20   Temp:  98.4 F (36.9 C)  98.3 F (36.8 C)  TempSrc:  Oral  Oral  SpO2:  96%  97%  Weight:      Height:        General appearance : Awake, alert, not in any distress. Speech Clear. Not toxic looking HEENT: Atraumatic and Normocephalic, pupils equally reactive to light and accomodation Neck: Supple, no JVD. No cervical lymphadenopathy.  Chest: Good air entry bilaterally, no added sounds  CVS: S1 S2 regular, no murmurs.  Abdomen: Bowel sounds present, Non tender and not distended with no gaurding, rigidity or rebound. Extremities: B/L Lower Ext shows no edema, both legs are warm to touch Neurology: Awake alert, and oriented X 3, CN II-XII intact, Non focal Skin: No Rash  Discharge Instructions  Discharge Instructions     Call MD for:  severe uncontrolled pain   Complete by: As directed    Call MD for:  temperature >100.4   Complete by: As directed    Diet - low sodium heart healthy   Complete by: As directed    Increase activity slowly   Complete by: As directed       Allergies as of 11/18/2023   No Known Allergies      Medication List     TAKE these medications    cholecalciferol  25 MCG (1000 UNIT) tablet Commonly known as: VITAMIN D3 Take 1,000 Units by mouth daily.   folic acid  1 MG tablet Commonly known as: FOLVITE  Take 1 tablet (1 mg total) by mouth daily.   hydroxyurea  500 MG capsule Commonly known as:  HYDREA  Take 3 capsules (1,500 mg total) by mouth daily.  May take with food to minimize GI side effects.   lisinopril  2.5 MG tablet Commonly known as: ZESTRIL  Take 2.5 mg by mouth daily.   ondansetron  4 MG disintegrating tablet Commonly known as: ZOFRAN -ODT 4mg  ODT q4 hours prn nausea/vomit   oxyCODONE  5 MG immediate release tablet Commonly known as: Oxy IR/ROXICODONE  Take 5 mg by mouth every 6 (six) hours as needed for severe pain.        The results of significant diagnostics from this hospitalization (including imaging, microbiology, ancillary and laboratory) are listed below for reference.    Significant Diagnostic Studies: CT ABDOMEN PELVIS W CONTRAST Result Date: 11/16/2023 CLINICAL DATA:  Abdominal pain EXAM: CT ABDOMEN AND PELVIS WITHOUT CONTRAST TECHNIQUE: Multidetector CT imaging of the abdomen and pelvis was performed following the standard protocol without IV contrast. RADIATION DOSE REDUCTION: This exam was performed according to the departmental dose-optimization program which includes automated exposure control, adjustment of the mA and/or kV according to patient size and/or use of iterative reconstruction technique. COMPARISON:  CT abdomen and pelvis November 06, 2023 FINDINGS: Lower chest: No infiltrates or consolidations, no pleural effusions Hepatobiliary: No change gallstones previously described on prior CT. No focal hepatic lesions or biliary dilatation. The upper abdomen lower chest significantly limited due to metallic artifact from the thoracolumbar instrumentation. Pancreas: Pancreas normal size. No masses calcifications or inflammatory changes. Spleen: Spleen normal size.  No masses. Adrenals/Urinary Tract: Adrenal glands are normal size. Follow-up recommended. Kidneys are normal. No masses calcifications or hydronephrosis Stomach/Bowel: No small or large bowel obstruction or inflammatory changes. Moderate amount of residual fecal material throughout the colon without obstruction or constipation. Vascular/Lymphatic: No  significant vascular findings are present. No enlarged abdominal or pelvic lymph nodes. Reproductive: Prostate is unremarkable. Other: Anterior abdominal wall unremarkable without evidence of umbilical or inguinal hernias Musculoskeletal: Visualized portion of the thoracolumbar spine and pelvic structures grossly unremarkable without evidence of fracture bony abnormalities or soft tissue masses. IMPRESSION: *No acute findings in the abdomen or pelvis. *Cholelithiasis. *Moderate amount of residual fecal material throughout the colon without obstruction or constipation. Electronically Signed   By: Fredrich Jefferson M.D.   On: 11/16/2023 11:52   CT ABDOMEN PELVIS W CONTRAST Result Date: 11/06/2023 EXAM: CT ABDOMEN AND PELVIS WITH CONTRAST 11/06/2023 07:30:00 PM TECHNIQUE: CT of the abdomen and pelvis was performed with the administration of intravenous contrast. Multiplanar reformatted images are provided for review. Automated exposure control, iterative reconstruction, and/or weight based adjustment of the mA/kV was utilized to reduce the radiation dose to as low as reasonably achievable. COMPARISON: 03/12/2012 CLINICAL HISTORY: Abdominal pain, acute, nonlocalized. Pt reports NV x 2 day, minor abd discomfort; reports sickle cell pain to lower back. FINDINGS: LOWER CHEST: Mild left basilar opacity, likely atelectasis. LIVER: The liver is unremarkable. GALLBLADDER AND BILE DUCTS: Layering gallstones, without associated inflammatory changes. SPLEEN: No acute abnormality. PANCREAS: No acute abnormality. ADRENAL GLANDS: No acute abnormality. KIDNEYS, URETERS AND BLADDER: No stones in the kidneys or ureters. No hydronephrosis. No perinephric or periureteral stranding. Urinary bladder is unremarkable. GI AND BOWEL: Normal appendix (image 55). No acute abnormality. PERITONEUM AND RETROPERITONEUM: No ascites. No free air. VASCULATURE: No aortic aneurysm. LYMPH NODES: No lymphadenopathy. REPRODUCTIVE ORGANS: No acute  abnormality. BONES AND SOFT TISSUES: Thoracic spine fixation hardware with associated streak artifact. Status post ORIF of the left hip. No acute osseous abnormality. No focal soft tissue abnormality. Mild osseous sclerosis without typical endplate changes of sickle  cell. IMPRESSION: 1. Layering gallstones, without associated inflammatory changes. 2. No acute abnormality. Electronically signed by: Zadie Herter MD 11/06/2023 07:42 PM EDT RP Workstation: [id number]    Microbiology: No results found for this or any previous visit (from the past 240 hours).   Labs: Basic Metabolic Panel: Recent Labs  Lab 11/16/23 1017  NA 138  K 3.6  CL 106  CO2 22  GLUCOSE 114*  BUN 13  CREATININE 1.08  CALCIUM 9.6   Liver Function Tests: Recent Labs  Lab 11/16/23 1017  AST 43*  ALT 19  ALKPHOS 88  BILITOT 2.4*  PROT 9.0*  ALBUMIN 4.5   Recent Labs  Lab 11/16/23 1017  LIPASE 29   No results for input(s): AMMONIA in the last 168 hours. CBC: Recent Labs  Lab 11/16/23 1017 11/17/23 0642 11/18/23 0518  WBC 14.8* 12.9* 14.6*  HGB 8.5* 7.5* 7.4*  HCT 23.5* 21.6* 20.7*  MCV 102.6* 107.5* 107.3*  PLT 291 215 176   Cardiac Enzymes: No results for input(s): CKTOTAL, CKMB, CKMBINDEX, TROPONINI in the last 168 hours. BNP: Invalid input(s): POCBNP CBG: No results for input(s): GLUCAP in the last 168 hours.  Time coordinating discharge: 50 minutes  Signed:  Lorel Roes NP   11/18/2023, 2:18 PM

## 2023-11-19 ENCOUNTER — Inpatient Hospital Stay (HOSPITAL_COMMUNITY)
Admission: EM | Admit: 2023-11-19 | Discharge: 2023-11-25 | DRG: 417 | Disposition: A | Discharge status: Home / Self Care | Attending: Internal Medicine | Admitting: Internal Medicine

## 2023-11-19 ENCOUNTER — Encounter (HOSPITAL_COMMUNITY): Payer: Self-pay

## 2023-11-19 ENCOUNTER — Other Ambulatory Visit: Payer: Self-pay

## 2023-11-19 DIAGNOSIS — K8010 Calculus of gallbladder with chronic cholecystitis without obstruction: Principal | ICD-10-CM | POA: Diagnosis present

## 2023-11-19 DIAGNOSIS — D638 Anemia in other chronic diseases classified elsewhere: Secondary | ICD-10-CM | POA: Diagnosis present

## 2023-11-19 DIAGNOSIS — D5700 Hb-SS disease with crisis, unspecified: Secondary | ICD-10-CM | POA: Diagnosis not present

## 2023-11-19 DIAGNOSIS — R1030 Lower abdominal pain, unspecified: Principal | ICD-10-CM

## 2023-11-19 DIAGNOSIS — M419 Scoliosis, unspecified: Secondary | ICD-10-CM | POA: Diagnosis present

## 2023-11-19 DIAGNOSIS — Z833 Family history of diabetes mellitus: Secondary | ICD-10-CM

## 2023-11-19 DIAGNOSIS — I10 Essential (primary) hypertension: Secondary | ICD-10-CM | POA: Diagnosis present

## 2023-11-19 DIAGNOSIS — R1031 Right lower quadrant pain: Secondary | ICD-10-CM | POA: Diagnosis not present

## 2023-11-19 DIAGNOSIS — R112 Nausea with vomiting, unspecified: Secondary | ICD-10-CM | POA: Diagnosis present

## 2023-11-19 DIAGNOSIS — Z8249 Family history of ischemic heart disease and other diseases of the circulatory system: Secondary | ICD-10-CM

## 2023-11-19 DIAGNOSIS — Z832 Family history of diseases of the blood and blood-forming organs and certain disorders involving the immune mechanism: Secondary | ICD-10-CM

## 2023-11-19 DIAGNOSIS — Z79899 Other long term (current) drug therapy: Secondary | ICD-10-CM

## 2023-11-19 DIAGNOSIS — K5900 Constipation, unspecified: Secondary | ICD-10-CM | POA: Diagnosis present

## 2023-11-19 DIAGNOSIS — G894 Chronic pain syndrome: Secondary | ICD-10-CM | POA: Diagnosis present

## 2023-11-19 DIAGNOSIS — K8020 Calculus of gallbladder without cholecystitis without obstruction: Secondary | ICD-10-CM | POA: Insufficient documentation

## 2023-11-19 LAB — COMPREHENSIVE METABOLIC PANEL WITH GFR
ALT: 26 U/L (ref 0–44)
AST: 44 U/L — ABNORMAL HIGH (ref 15–41)
Albumin: 4.6 g/dL (ref 3.5–5.0)
Alkaline Phosphatase: 89 U/L (ref 38–126)
Anion gap: 11 (ref 5–15)
BUN: 13 mg/dL (ref 6–20)
CO2: 23 mmol/L (ref 22–32)
Calcium: 9.4 mg/dL (ref 8.9–10.3)
Chloride: 106 mmol/L (ref 98–111)
Creatinine, Ser: 0.95 mg/dL (ref 0.61–1.24)
GFR, Estimated: >60 mL/min (ref >60)
Glucose, Bld: 113 mg/dL — ABNORMAL HIGH (ref 70–99)
Potassium: 4 mmol/L (ref 3.5–5.1)
Sodium: 140 mmol/L (ref 135–145)
Total Bilirubin: 2 mg/dL — ABNORMAL HIGH (ref 0.0–1.2)
Total Protein: 9 g/dL — ABNORMAL HIGH (ref 6.5–8.1)

## 2023-11-19 LAB — RETICULOCYTES
Immature Retic Fract: 30.3 % — ABNORMAL HIGH (ref 2.3–15.9)
RBC.: 2.22 MIL/uL — ABNORMAL LOW (ref 4.22–5.81)
Retic Count, Absolute: 157.6 10*3/uL (ref 19.0–186.0)
Retic Ct Pct: 7.1 % — ABNORMAL HIGH (ref 0.4–3.1)

## 2023-11-19 LAB — CBC WITH DIFFERENTIAL/PLATELET
Abs Immature Granulocytes: 0.06 10*3/uL (ref 0.00–0.07)
Basophils Absolute: 0.1 10*3/uL (ref 0.0–0.1)
Basophils Relative: 1 %
Eosinophils Absolute: 0.1 10*3/uL (ref 0.0–0.5)
Eosinophils Relative: 1 %
HCT: 22.8 % — ABNORMAL LOW (ref 39.0–52.0)
Hemoglobin: 8.5 g/dL — ABNORMAL LOW (ref 13.0–17.0)
Immature Granulocytes: 1 %
Lymphocytes Relative: 22 %
Lymphs Abs: 2.5 10*3/uL (ref 0.7–4.0)
MCH: 38.6 pg — ABNORMAL HIGH (ref 26.0–34.0)
MCHC: 37.3 g/dL — ABNORMAL HIGH (ref 30.0–36.0)
MCV: 103.6 fL — ABNORMAL HIGH (ref 80.0–100.0)
Monocytes Absolute: 1.3 10*3/uL — ABNORMAL HIGH (ref 0.1–1.0)
Monocytes Relative: 12 %
Neutro Abs: 7.3 10*3/uL (ref 1.7–7.7)
Neutrophils Relative %: 63 %
Platelets: 201 10*3/uL (ref 150–400)
RBC: 2.2 MIL/uL — ABNORMAL LOW (ref 4.22–5.81)
RDW: 20.6 % — ABNORMAL HIGH (ref 11.5–15.5)
WBC: 11.3 10*3/uL — ABNORMAL HIGH (ref 4.0–10.5)
nRBC: 2.4 % — ABNORMAL HIGH (ref 0.0–0.2)

## 2023-11-19 LAB — LIPASE, BLOOD: Lipase: 31 U/L (ref 11–51)

## 2023-11-19 MED ORDER — HYDROMORPHONE HCL 1 MG/ML IJ SOLN
2.0000 mg | INTRAMUSCULAR | Status: AC
  Administered 2023-11-19: 2 mg via INTRAVENOUS
  Filled 2023-11-19: qty 2

## 2023-11-19 MED ORDER — ONDANSETRON HCL 4 MG/2ML IJ SOLN
4.0000 mg | INTRAMUSCULAR | Status: DC | PRN
Start: 2023-11-19 — End: 2023-11-25
  Administered 2023-11-19 – 2023-11-23 (×7): 4 mg via INTRAVENOUS
  Filled 2023-11-19 (×7): qty 2

## 2023-11-19 MED ORDER — SODIUM CHLORIDE 0.9 % IV SOLN
12.5000 mg | Freq: Once | INTRAVENOUS | Status: AC
  Administered 2023-11-19: 12.5 mg via INTRAVENOUS
  Filled 2023-11-19: qty 0.25
  Filled 2023-11-19: qty 12.5

## 2023-11-19 NOTE — ED Triage Notes (Signed)
 Pt reports continued sickle cell pain to lower back and legs due to not being able to keep his home meds down. Pt reports continued emesis and abdominal pain and was dx with gallstones.

## 2023-11-19 NOTE — ED Provider Notes (Signed)
 [place] EMERGENCY DEPARTMENT AT [facility] Provider Note   CSN: [id number] Arrival date & time: 11/19/23  2042     Patient presents with: Abdominal Pain and Sickle Cell Pain Crisis   Manuel Mcguire is a 33 y.o. male.  {Add pertinent medical, surgical, social history, OB history to HPI:32970} HPI    33 year old male comes in with chief complaint of sickle cell pain.  Patient has history of sickle cell anemia.  He was just discharged from the hospital yesterday.  Patient had abdominal pain, he was told that gallstones could be causing it.  He is supposed to follow-up with [facility] doctors sometime next week.  Patient accompanied by family members.   Patient states that after being discharged, he continues to have vomiting and abdominal pain.  He has a sickle cell pain crisis which includes back pain, leg pain, but also now having abdominal pain.  Abdominal pain is generalized, mostly in the lower quadrants. Prior to Admission medications   Medication Sig Start Date End Date Taking? Authorizing Provider  cholecalciferol  (VITAMIN D3) 25 MCG (1000 UNIT) tablet Take 1,000 Units by mouth daily.   Yes [provider]  folic acid  (FOLVITE ) 1 MG tablet Take 1 tablet (1 mg total) by mouth daily. 10/27/12  Yes Lynnetta Sauce, MD  hydroxyurea  (HYDREA ) 500 MG capsule Take 3 capsules (1,500 mg total) by mouth daily. May take with food to minimize GI side effects. 10/27/12  Yes Lynnetta Sauce, MD  lisinopril  (ZESTRIL ) 2.5 MG tablet Take 2.5 mg by mouth daily.   Yes [provider]  ondansetron  (ZOFRAN -ODT) 4 MG disintegrating tablet 4mg  ODT q4 hours prn nausea/vomit 11/06/23  Yes Dalene Duck, MD  oxyCODONE  (OXY IR/ROXICODONE ) 5 MG immediate release tablet Take 5 mg by mouth every 6 (six) hours as needed for severe pain.   Yes [provider]    Allergies: Patient has no known allergies.    Review of Systems  All other systems reviewed and are  negative.   Updated Vital Signs BP 127/83   Pulse 74   Temp 98.4 F (36.9 C) (Oral)   Resp (!) 21   SpO2 99%   Physical Exam Vitals and nursing note reviewed.  Constitutional:      Appearance: He is well-developed.  HENT:     Head: Atraumatic.   Cardiovascular:     Rate and Rhythm: Normal rate.  Pulmonary:     Effort: Pulmonary effort is normal.  Abdominal:     Tenderness: There is abdominal tenderness in the right lower quadrant, suprapubic area and left lower quadrant. There is no guarding or rebound.   Musculoskeletal:     Cervical back: Neck supple.   Skin:    General: Skin is warm.   Neurological:     Mental Status: He is alert and oriented to person, place, and time.     (all labs ordered are listed, but only abnormal results are displayed) Labs Reviewed  COMPREHENSIVE METABOLIC PANEL WITH GFR - Abnormal; Notable for the following components:      Result Value   Glucose, Bld 113 (*)    Total Protein 9.0 (*)    AST 44 (*)    Total Bilirubin 2.0 (*)    All other components within normal limits  CBC WITH DIFFERENTIAL/PLATELET - Abnormal; Notable for the following components:   WBC 11.3 (*)    RBC 2.20 (*)    Hemoglobin 8.5 (*)    HCT 22.8 (*)  MCV 103.6 (*)    MCH 38.6 (*)    MCHC 37.3 (*)    RDW 20.6 (*)    nRBC 2.4 (*)    Monocytes Absolute 1.3 (*)    All other components within normal limits  RETICULOCYTES - Abnormal; Notable for the following components:   Retic Ct Pct 7.1 (*)    RBC. 2.22 (*)    Immature Retic Fract 30.3 (*)    All other components within normal limits  LIPASE, BLOOD  URINALYSIS, ROUTINE W REFLEX MICROSCOPIC    EKG: None  Radiology: No results found.  {Document cardiac monitor, telemetry assessment procedure when appropriate:32947} Procedures   Medications Ordered in the ED  HYDROmorphone  (DILAUDID ) injection 2 mg (has no administration in time range)  ondansetron  (ZOFRAN ) injection 4 mg (4 mg Intravenous Given  11/19/23 2309)  HYDROmorphone  (DILAUDID ) injection 2 mg (2 mg Intravenous Given 11/19/23 2232)  HYDROmorphone  (DILAUDID ) injection 2 mg (2 mg Intravenous Given 11/19/23 2309)  diphenhydrAMINE  (BENADRYL ) 12.5 mg in sodium chloride  0.9 % 50 mL IVPB (0 mg Intravenous Stopped 11/19/23 2311)      {Click here for ABCD2, HEART and other calculators REFRESH Note before signing:1}                              Medical Decision Making Amount and/or Complexity of Data Reviewed Labs: ordered.  Risk Prescription drug management.   Pt comes in with sickle cell related pain + abdominal pain and nausea.  Patient has history of sickle cell anemia with complications from it including vaso-occlusive crisis in the past. We have reviewed patient's previous ED visits, recent lab results and recent imaging results for this encounter.  Differential diagnosis includes vaso-occlusive pain, occult infection, dehydration, electrolyte abnormality, hyperbilirubinemia, aplastic anemia.  For the abdominal pain, we considered gallstones, intra-abdominal infection, infarction of organs and mesenteric ischemia.  Patient however has had 2 CT scans recently which were overall reassuring besides gallstone.  His exam, is overall reassuring.  I do not think repeat CT is needed, and the benefit does not outweigh the risk of harm.  VSS and WNL -  hemodynamically stable.  History and exam is not indicative of any specific source of infection at this time.  Pain appears vaso-occlusive type and typical of previous pain. Appropriate labs ordered.  Pain control and symptom management initiated while in the ED with parenteral medication. We will reassess patient after multiple doses of analgesics. Goal is to break the pain and see if patient feels comfortable going home.  Currently, there is no signs of severe decompensation clinically. Will continue to monitor closely.  Admission has been considered for this patient, and we will  proceed with this request If we are unable to control the pain, we will admit the patient.   Final diagnoses:  Lower abdominal pain  Sickle cell pain crisis [facility])    ED Discharge Orders     None

## 2023-11-20 ENCOUNTER — Inpatient Hospital Stay (HOSPITAL_COMMUNITY)

## 2023-11-20 DIAGNOSIS — K8010 Calculus of gallbladder with chronic cholecystitis without obstruction: Secondary | ICD-10-CM | POA: Diagnosis not present

## 2023-11-20 DIAGNOSIS — M419 Scoliosis, unspecified: Secondary | ICD-10-CM | POA: Diagnosis not present

## 2023-11-20 DIAGNOSIS — K8050 Calculus of bile duct without cholangitis or cholecystitis without obstruction: Secondary | ICD-10-CM | POA: Diagnosis not present

## 2023-11-20 DIAGNOSIS — D5700 Hb-SS disease with crisis, unspecified: Secondary | ICD-10-CM

## 2023-11-20 DIAGNOSIS — K8020 Calculus of gallbladder without cholecystitis without obstruction: Secondary | ICD-10-CM | POA: Diagnosis not present

## 2023-11-20 DIAGNOSIS — R1031 Right lower quadrant pain: Secondary | ICD-10-CM | POA: Diagnosis not present

## 2023-11-20 DIAGNOSIS — R112 Nausea with vomiting, unspecified: Secondary | ICD-10-CM | POA: Diagnosis not present

## 2023-11-20 DIAGNOSIS — G894 Chronic pain syndrome: Secondary | ICD-10-CM | POA: Diagnosis not present

## 2023-11-20 DIAGNOSIS — K5900 Constipation, unspecified: Secondary | ICD-10-CM | POA: Diagnosis not present

## 2023-11-20 DIAGNOSIS — Z833 Family history of diabetes mellitus: Secondary | ICD-10-CM | POA: Diagnosis not present

## 2023-11-20 DIAGNOSIS — Z79899 Other long term (current) drug therapy: Secondary | ICD-10-CM | POA: Diagnosis not present

## 2023-11-20 DIAGNOSIS — D638 Anemia in other chronic diseases classified elsewhere: Secondary | ICD-10-CM | POA: Diagnosis not present

## 2023-11-20 DIAGNOSIS — Z832 Family history of diseases of the blood and blood-forming organs and certain disorders involving the immune mechanism: Secondary | ICD-10-CM | POA: Diagnosis not present

## 2023-11-20 DIAGNOSIS — I10 Essential (primary) hypertension: Secondary | ICD-10-CM | POA: Diagnosis not present

## 2023-11-20 DIAGNOSIS — Z8249 Family history of ischemic heart disease and other diseases of the circulatory system: Secondary | ICD-10-CM | POA: Diagnosis not present

## 2023-11-20 DIAGNOSIS — R1030 Lower abdominal pain, unspecified: Secondary | ICD-10-CM | POA: Diagnosis not present

## 2023-11-20 LAB — CBC
HCT: 22.9 % — ABNORMAL LOW (ref 39.0–52.0)
Hemoglobin: 7.9 g/dL — ABNORMAL LOW (ref 13.0–17.0)
MCH: 37.1 pg — ABNORMAL HIGH (ref 26.0–34.0)
MCHC: 34.5 g/dL (ref 30.0–36.0)
MCV: 107.5 fL — ABNORMAL HIGH (ref 80.0–100.0)
Platelets: 186 10*3/uL (ref 150–400)
RBC: 2.13 MIL/uL — ABNORMAL LOW (ref 4.22–5.81)
RDW: 20.3 % — ABNORMAL HIGH (ref 11.5–15.5)
WBC: 15.1 10*3/uL — ABNORMAL HIGH (ref 4.0–10.5)
nRBC: 1.6 % — ABNORMAL HIGH (ref 0.0–0.2)

## 2023-11-20 LAB — URINALYSIS, ROUTINE W REFLEX MICROSCOPIC
Bacteria, UA: NONE SEEN
Bilirubin Urine: NEGATIVE
Glucose, UA: NEGATIVE mg/dL
Ketones, ur: NEGATIVE mg/dL
Leukocytes,Ua: NEGATIVE
Nitrite: NEGATIVE
Protein, ur: 100 mg/dL — AB
Specific Gravity, Urine: 1.012 (ref 1.005–1.030)
pH: 5 (ref 5.0–8.0)

## 2023-11-20 LAB — MAGNESIUM: Magnesium: 2.1 mg/dL (ref 1.7–2.4)

## 2023-11-20 LAB — COMPREHENSIVE METABOLIC PANEL WITH GFR
ALT: 22 U/L (ref 0–44)
AST: 37 U/L (ref 15–41)
Albumin: 4 g/dL (ref 3.5–5.0)
Alkaline Phosphatase: 77 U/L (ref 38–126)
Anion gap: 7 (ref 5–15)
BUN: 12 mg/dL (ref 6–20)
CO2: 25 mmol/L (ref 22–32)
Calcium: 9.2 mg/dL (ref 8.9–10.3)
Chloride: 104 mmol/L (ref 98–111)
Creatinine, Ser: 0.94 mg/dL (ref 0.61–1.24)
GFR, Estimated: >60 mL/min (ref >60)
Glucose, Bld: 102 mg/dL — ABNORMAL HIGH (ref 70–99)
Potassium: 3.7 mmol/L (ref 3.5–5.1)
Sodium: 136 mmol/L (ref 135–145)
Total Bilirubin: 2.3 mg/dL — ABNORMAL HIGH (ref 0.0–1.2)
Total Protein: 8.6 g/dL — ABNORMAL HIGH (ref 6.5–8.1)

## 2023-11-20 LAB — PHOSPHORUS: Phosphorus: 5 mg/dL — ABNORMAL HIGH (ref 2.5–4.6)

## 2023-11-20 MED ORDER — ENOXAPARIN SODIUM 40 MG/0.4ML IJ SOSY
40.0000 mg | PREFILLED_SYRINGE | INTRAMUSCULAR | Status: DC
  Administered 2023-11-20 – 2023-11-23 (×3): 40 mg via SUBCUTANEOUS
  Filled 2023-11-20 (×6): qty 0.4

## 2023-11-20 MED ORDER — SODIUM CHLORIDE 0.9% FLUSH: 9.0000 mL | INTRAVENOUS | Status: DC | PRN

## 2023-11-20 MED ORDER — SODIUM CHLORIDE 0.45 % IV SOLN: INTRAVENOUS | Status: AC

## 2023-11-20 MED ORDER — MELATONIN 5 MG PO TABS: 5.0000 mg | ORAL_TABLET | Freq: Every evening | ORAL | Status: DC | PRN

## 2023-11-20 MED ORDER — ACETAMINOPHEN 325 MG PO TABS: 650.0000 mg | ORAL_TABLET | Freq: Four times a day (QID) | ORAL | Status: DC | PRN

## 2023-11-20 MED ORDER — VITAMIN D 25 MCG (1000 UNIT) PO TABS
1000.0000 [IU] | ORAL_TABLET | Freq: Every day | ORAL | Status: DC
  Administered 2023-11-20 – 2023-11-25 (×4): 1000 [IU] via ORAL
  Filled 2023-11-20 (×6): qty 1

## 2023-11-20 MED ORDER — FOLIC ACID 1 MG PO TABS
1.0000 mg | ORAL_TABLET | Freq: Every day | ORAL | Status: DC
  Administered 2023-11-20 – 2023-11-25 (×4): 1 mg via ORAL
  Filled 2023-11-20 (×6): qty 1

## 2023-11-20 MED ORDER — NALOXONE HCL 0.4 MG/ML IJ SOLN: 0.4000 mg | INTRAMUSCULAR | Status: DC | PRN

## 2023-11-20 MED ORDER — HYDROMORPHONE HCL 1 MG/ML IJ SOLN
2.0000 mg | INTRAMUSCULAR | Status: AC
  Administered 2023-11-20 (×2): 2 mg via INTRAVENOUS
  Filled 2023-11-20 (×2): qty 2

## 2023-11-20 MED ORDER — OXYCODONE HCL 5 MG PO TABS
5.0000 mg | ORAL_TABLET | ORAL | Status: AC | PRN
  Administered 2023-11-20 – 2023-11-23 (×5): 5 mg via ORAL
  Filled 2023-11-20 (×5): qty 1

## 2023-11-20 MED ORDER — HYDROMORPHONE 1 MG/ML IV SOLN
INTRAVENOUS | Status: DC
  Administered 2023-11-20: 30 mg via INTRAVENOUS
  Filled 2023-11-20: qty 30

## 2023-11-20 MED ORDER — POLYETHYLENE GLYCOL 3350 17 G PO PACK
17.0000 g | PACK | Freq: Every day | ORAL | Status: DC | PRN
  Administered 2023-11-20 – 2023-11-21 (×2): 17 g via ORAL
  Filled 2023-11-20 (×4): qty 1

## 2023-11-20 MED ORDER — DIPHENHYDRAMINE HCL 25 MG PO CAPS
25.0000 mg | ORAL_CAPSULE | ORAL | Status: DC | PRN
  Administered 2023-11-20: 25 mg via ORAL
  Filled 2023-11-20: qty 1

## 2023-11-20 MED ORDER — HYDROMORPHONE 1 MG/ML IV SOLN
INTRAVENOUS | Status: DC
  Administered 2023-11-20: 7.5 mg via INTRAVENOUS
  Administered 2023-11-20: 8 mg via INTRAVENOUS
  Administered 2023-11-20: 30 mg via INTRAVENOUS
  Administered 2023-11-20: 7 mg via INTRAVENOUS
  Administered 2023-11-20 – 2023-11-21 (×2): 4.5 mg via INTRAVENOUS
  Administered 2023-11-21: 7 mg via INTRAVENOUS
  Administered 2023-11-21 (×2): 5.5 mg via INTRAVENOUS
  Administered 2023-11-21 (×2): 6.5 mg via INTRAVENOUS
  Administered 2023-11-21: 4 mg via INTRAVENOUS
  Administered 2023-11-22: 30 mg via INTRAVENOUS
  Administered 2023-11-22 (×2): 7 mg via INTRAVENOUS
  Administered 2023-11-22: 11 mg via INTRAVENOUS
  Administered 2023-11-22: 8 mg via INTRAVENOUS
  Administered 2023-11-23: 4 mg via INTRAVENOUS
  Administered 2023-11-23: 3.5 mg via INTRAVENOUS
  Administered 2023-11-23: 8.5 mg via INTRAVENOUS
  Administered 2023-11-23: 4 mg via INTRAVENOUS
  Administered 2023-11-23: 30 mg via INTRAVENOUS
  Administered 2023-11-23: 8.5 mg via INTRAVENOUS
  Administered 2023-11-23: 0.5 mg via INTRAVENOUS
  Administered 2023-11-23: 6 mg via INTRAVENOUS
  Administered 2023-11-25: 7.5 mg via INTRAVENOUS
  Administered 2023-11-25: 4.5 mg via INTRAVENOUS
  Administered 2023-11-25: 7.5 mg via INTRAVENOUS
  Filled 2023-11-20 (×6): qty 30

## 2023-11-20 MED ORDER — HYDROXYUREA 500 MG PO CAPS
1500.0000 mg | ORAL_CAPSULE | Freq: Every day | ORAL | Status: DC
  Administered 2023-11-20 – 2023-11-25 (×4): 1500 mg via ORAL
  Filled 2023-11-20 (×6): qty 3

## 2023-11-20 NOTE — Progress Notes (Signed)
   11/20/23 0955  TOC Brief Assessment  Insurance and Status Reviewed  Patient has primary care physician Yes  Home environment has been reviewed single family home  Prior level of function: independent  Prior/Current Home Services No current home services  Social Drivers of Health Review SDOH reviewed no interventions necessary  Readmission risk has been reviewed Yes  Transition of care needs no transition of care needs at this time    [place], MSW, LCSW 11/20/2023 9:55 AM

## 2023-11-20 NOTE — H&P (Signed)
 History and Physical  Manuel Mcguire NUU:[id number] DOB: [birth year] DOA: 11/19/2023  Referring physician: Dr. Maralee Senate, EDP. PCP: Pcp, No  Outpatient Specialists: Sickle cell clinic Patient coming from: Home.  Chief Complaint: Abdominal pain, sickle cell pain, nausea and vomiting.  HPI: Manuel Mcguire is a 34 y.o. male with medical history significant for sickle cell disease, cholelithiasis seen on CT scan (11/16/2023), recently admitted for sickle cell pain crisis and discharged yesterday, who presents to the ER due to upper abdominal pain, intractable nausea and vomiting for the past 1 to 2 days.  Unable to keep anything down.  States every time he eats he becomes nauseous and vomits.  No reported subjective fevers or chills.  Also endorses his typical sickle cell pain involving his lower back.  In the ER, afebrile.  Tenderness on palpation involving upper abdomen.  Lipase was within normal range, 31.  AST and total bilirubin were mildly elevated.  The patient received multiple rounds of opiate based IV analgesics.  TRH, hospitalist service, was asked to admit for further management.  ED Course: Temperature 98.2.  BP 135/78, pulse 77, respiratory 16, O2 saturation 96% on room air.  Review of Systems: Review of systems as noted in the HPI. All other systems reviewed and are negative.   Past Medical History:  Diagnosis Date   Scoliosis    Sickle cell anemia with pain (HCC)    History reviewed. No pertinent surgical history.  Social History:  reports that he has never smoked. He does not have any smokeless tobacco history on file. He reports that he does not drink alcohol and does not use drugs.   No Known Allergies  Family History  Problem Relation Age of Onset   Diabetes Mother    Hypertension Mother    Sickle cell anemia Sister       Prior to Admission medications   Medication Sig Start Date End Date Taking? Authorizing Provider  cholecalciferol  (VITAMIN D3) 25 MCG  (1000 UNIT) tablet Take 1,000 Units by mouth daily.   Yes [provider]  folic acid  (FOLVITE ) 1 MG tablet Take 1 tablet (1 mg total) by mouth daily. 10/27/12  Yes Lynnetta Sauce, MD  hydroxyurea  (HYDREA ) 500 MG capsule Take 3 capsules (1,500 mg total) by mouth daily. May take with food to minimize GI side effects. 10/27/12  Yes Lynnetta Sauce, MD  lisinopril  (ZESTRIL ) 2.5 MG tablet Take 2.5 mg by mouth daily.   Yes [provider]  ondansetron  (ZOFRAN -ODT) 4 MG disintegrating tablet 4mg  ODT q4 hours prn nausea/vomit 11/06/23  Yes Dalene Duck, MD  oxyCODONE  (OXY IR/ROXICODONE ) 5 MG immediate release tablet Take 5 mg by mouth every 6 (six) hours as needed for severe pain.   Yes [provider]    Physical Exam: BP 119/87   Pulse 96   Temp 98.1 F (36.7 C)   Resp 16   SpO2 97%   General: 33 y.o. year-old male well developed well nourished in no acute distress.  Alert and oriented x3. Cardiovascular: Regular rate and rhythm with no rubs or gallops.  No thyromegaly or JVD noted.  No lower extremity edema. 2/4 pulses in all 4 extremities. Respiratory: Clear to auscultation with no wheezes or rales. Good inspiratory effort. Abdomen: Soft epigastric and right upper quadrant abdominal tenderness on palpation.  Nondistended with normal bowel sounds x4 quadrants. Muskuloskeletal: No cyanosis, clubbing or edema noted bilaterally Neuro: CN II-XII intact, strength, sensation, reflexes Skin: No ulcerative lesions noted or  rashes Psychiatry: Judgement and insight appear normal. Mood is appropriate for condition and setting          Labs on Admission:  Basic Metabolic Panel: Recent Labs  Lab 11/16/23 1017 11/19/23 2121  NA 138 140  K 3.6 4.0  CL 106 106  CO2 22 23  GLUCOSE 114* 113*  BUN 13 13  CREATININE 1.08 0.95  CALCIUM 9.6 9.4   Liver Function Tests: Recent Labs  Lab 11/16/23 1017 11/19/23 2121  AST 43* 44*  ALT 19 26  ALKPHOS 88 89   BILITOT 2.4* 2.0*  PROT 9.0* 9.0*  ALBUMIN 4.5 4.6   Recent Labs  Lab 11/16/23 1017 11/19/23 2121  LIPASE 29 31   No results for input(s): AMMONIA in the last 168 hours. CBC: Recent Labs  Lab 11/16/23 1017 11/17/23 0642 11/18/23 0518 11/19/23 2121  WBC 14.8* 12.9* 14.6* 11.3*  NEUTROABS  --   --   --  7.3  HGB 8.5* 7.5* 7.4* 8.5*  HCT 23.5* 21.6* 20.7* 22.8*  MCV 102.6* 107.5* 107.3* 103.6*  PLT 291 215 176 201   Cardiac Enzymes: No results for input(s): CKTOTAL, CKMB, CKMBINDEX, TROPONINI in the last 168 hours.  BNP (last 3 results) No results for input(s): BNP in the last 8760 hours.  ProBNP (last 3 results) No results for input(s): PROBNP in the last 8760 hours.  CBG: No results for input(s): GLUCAP in the last 168 hours.  Radiological Exams on Admission: No results found.  EKG: I independently viewed the EKG done and my findings are as followed: None available at time of this visit.  Assessment/Plan Present on Admission:  Sickle cell pain crisis (HCC)  Principal Problem:   Sickle cell pain crisis (HCC)  Sickle cell pain crisis, unclear trigger. Pain control with PCA pump Dilaudid . IV fluid half-normal saline at 100 cc/h x 1 day Continue supportive care, IV antiemetics as needed, antihistamines as needed Resume home hydroxyurea  and folic acid  supplement Resume home sickle cell disease home regimen  Intractable nausea and vomiting, right upper quadrant abdominal pain. History of cholelithiasis seen on CT scan from previous admission. Follow right upper quadrant abdominal ultrasound to rule out acute cholecystitis Continue supportive care IV antiemetics as needed IV fluid hydration Pain control  Hypertension, BPs are currently soft Hold off oral antihypertensives for now Closely monitor vital signs  Anemia of chronic disease in the setting of sickle cell disease Hemoglobin stable 8.5 No reported overt bleeding.  Isolated  elevated AST and T bilirubin Suspect from sickle cell crisis Rule out other causes Follow-up right upper quadrant ultrasound   Time: 75 minutes.   DVT prophylaxis: Subcu Lovenox  daily.  Code Status: Full code.  Family Communication: Brother at bedside.  Disposition Plan: Admitted to telemetry unit.  Consults called: None.  Admission status: Inpatient status.   Status is: Inpatient The patient requires at least 2 midnights for further evaluation and treatment of present condition.   Bary Boss MD [facility] Pager [phone]  If 7PM-7AM, please contact night-coverage [email or web address] Password [email or web address]  11/20/2023, 1:30 AM

## 2023-11-20 NOTE — Progress Notes (Signed)
  [facility] ONCOLOGY [address] [place], [place]  [address] Phone:  [phone]  November 19, 2023   To Whom It May Concern:  Sintim Joline Ned was present at [facility] overnight.  Karry Paganini, RN

## 2023-11-20 NOTE — Progress Notes (Signed)
 Patient was admitted this morning with sickle cell pain crisis.  Also having abdominal pain with intractable nausea with vomiting.  Symptoms have improved now.  He is on Zofran .

## 2023-11-20 NOTE — Plan of Care (Signed)
  Problem: Clinical Measurements: Goal: Will remain free from infection Outcome: Progressing   Problem: Activity: Goal: Risk for activity intolerance will decrease Outcome: Progressing   Problem: Elimination: Goal: Will not experience complications related to bowel motility Outcome: Progressing Goal: Will not experience complications related to urinary retention Outcome: Progressing   Problem: Pain Managment: Goal: General experience of comfort will improve and/or be controlled Outcome: Progressing

## 2023-11-21 DIAGNOSIS — K8050 Calculus of bile duct without cholangitis or cholecystitis without obstruction: Secondary | ICD-10-CM | POA: Diagnosis not present

## 2023-11-21 MED ORDER — INDOCYANINE GREEN 25 MG IV SOLR
1.2500 mg | Freq: Once | INTRAVENOUS | Status: AC
  Administered 2023-11-22: 1.25 mg via INTRAVENOUS
  Filled 2023-11-21: qty 10

## 2023-11-21 MED ORDER — CEFAZOLIN SODIUM-DEXTROSE 2-4 GM/100ML-% IV SOLN
2.0000 g | Freq: Once | INTRAVENOUS | Status: AC
  Administered 2023-11-22: 2 g via INTRAVENOUS
  Filled 2023-11-21: qty 100

## 2023-11-21 NOTE — Consult Note (Signed)
 Consult Note  Manuel Mcguire [birth year]  [id number].    Requesting MD: Sudie Ely, MD Chief Complaint/Reason for Consult: Cholelithiasis, intractable nausea and vomiting HPI:  Patient is a 33 year old male with PMH scoliosis and sickle cell anemia who presented to the hospital 6/14 with intractable nausea and vomiting. He was hospitalized for sickle cell pain crisis 6/11-6/13. Was also seen in the ED 6/1 for nausea and vomiting. Patient reports intractable nausea and vomiting that seems to come on about 30-60 min after eating, although admittedly he sometimes has nausea and vomiting outside of this too. He reports symptoms that were more mild going back to over a year ago but then it was only happening every few months. Episodes seem to be increasing in frequency and intensity. Denies sick contacts or unusual food exposures. He is currently feeling improved today after IV nausea medication but tried taking PO zofran  at home with no relief prior to coming back in. He reports abdominal muscular soreness from vomiting but no other abdominal pain. He is having some bowel function. His father is at bedside. He has had no prior abdominal surgery. He is not on blood thinners and has NKDA. He works as an Art gallery manager.   ROS: Negative other than HPI  Family History  Problem Relation Age of Onset   Diabetes Mother    Hypertension Mother    Sickle cell anemia Sister     Past Medical History:  Diagnosis Date   Scoliosis    Sickle cell anemia with pain (HCC)     History reviewed. No pertinent surgical history.  Social History:  reports that he has never smoked. He does not have any smokeless tobacco history on file. He reports that he does not drink alcohol and does not use drugs.  Allergies: No Known Allergies  Medications Prior to Admission  Medication Sig Dispense Refill   cholecalciferol  (VITAMIN D3) 25 MCG (1000 UNIT) tablet Take 1,000 Units by mouth daily.     folic acid   (FOLVITE ) 1 MG tablet Take 1 tablet (1 mg total) by mouth daily. 30 tablet 11   hydroxyurea  (HYDREA ) 500 MG capsule Take 3 capsules (1,500 mg total) by mouth daily. May take with food to minimize GI side effects. 90 capsule 11   lisinopril  (ZESTRIL ) 2.5 MG tablet Take 2.5 mg by mouth daily.     ondansetron  (ZOFRAN -ODT) 4 MG disintegrating tablet 4mg  ODT q4 hours prn nausea/vomit 10 tablet 0   oxyCODONE  (OXY IR/ROXICODONE ) 5 MG immediate release tablet Take 5 mg by mouth every 6 (six) hours as needed for severe pain.      Blood pressure 114/81, pulse 65, temperature (!) 97.4 F (36.3 C), temperature source Oral, resp. rate 15, height 5' 7 (1.702 m), weight 69.8 kg, SpO2 96%. Physical Exam:  General: pleasant, WD, WN male who is laying in bed in NAD HEENT: head is normocephalic, atraumatic.  Sclera are anicteric.  Pupils equal and round.  Ears and nose without any masses or lesions.  Mouth is pink and moist Heart: regular, rate, and rhythm. Palpable radial and pedal pulses bilaterally Lungs: No wheezes, rhonchi, or rales noted.  Respiratory effort nonlabored Abd: soft, NT, ND, no masses, hernias, or organomegaly MS: all 4 extremities are symmetrical with no cyanosis, clubbing, or edema. Skin: warm and dry with no masses, lesions, or rashes Neuro: Cranial nerves 2-12 grossly intact, sensation is normal throughout Psych: A&Ox3 with an appropriate affect.   Results for orders placed or  performed during the hospital encounter of 11/19/23 (from the past 48 hours)  Comprehensive metabolic panel     Status: Abnormal   Collection Time: 11/19/23  9:21 PM  Result Value Ref Range   Sodium 140 135 - 145 mmol/L   Potassium 4.0 3.5 - 5.1 mmol/L   Chloride 106 98 - 111 mmol/L   CO2 23 22 - 32 mmol/L   Glucose, Bld 113 (H) 70 - 99 mg/dL    Comment: Glucose reference range applies only to samples taken after fasting for at least 8 hours.   BUN 13 6 - 20 mg/dL   Creatinine, Ser 1.19 0.61 - 1.24 mg/dL    Calcium 9.4 8.9 - 14.7 mg/dL   Total Protein 9.0 (H) 6.5 - 8.1 g/dL   Albumin 4.6 3.5 - 5.0 g/dL   AST 44 (H) 15 - 41 U/L   ALT 26 0 - 44 U/L   Alkaline Phosphatase 89 38 - 126 U/L   Total Bilirubin 2.0 (H) 0.0 - 1.2 mg/dL   GFR, Estimated >82 >95 mL/min    Comment: (NOTE) Calculated using the CKD-EPI Creatinine Equation (2021)    Anion gap 11 5 - 15    Comment: Performed at [facility], [address] [place], [place] [address]  CBC with Differential     Status: Abnormal   Collection Time: 11/19/23  9:21 PM  Result Value Ref Range   WBC 11.3 (H) 4.0 - 10.5 K/uL   RBC 2.20 (L) 4.22 - 5.81 MIL/uL   Hemoglobin 8.5 (L) 13.0 - 17.0 g/dL   HCT 86.5 (L) 78.4 - 69.6 %   MCV 103.6 (H) 80.0 - 100.0 fL   MCH 38.6 (H) 26.0 - 34.0 pg   MCHC 37.3 (H) 30.0 - 36.0 g/dL   RDW 29.5 (H) 28.4 - 13.2 %   Platelets 201 150 - 400 K/uL   nRBC 2.4 (H) 0.0 - 0.2 %   Neutrophils Relative % 63 %   Neutro Abs 7.3 1.7 - 7.7 K/uL   Lymphocytes Relative 22 %   Lymphs Abs 2.5 0.7 - 4.0 K/uL   Monocytes Relative 12 %   Monocytes Absolute 1.3 (H) 0.1 - 1.0 K/uL   Eosinophils Relative 1 %   Eosinophils Absolute 0.1 0.0 - 0.5 K/uL   Basophils Relative 1 %   Basophils Absolute 0.1 0.0 - 0.1 K/uL   Immature Granulocytes 1 %   Abs Immature Granulocytes 0.06 0.00 - 0.07 K/uL    Comment: Performed at [facility], [address] [place], [place] [address]  Reticulocytes     Status: Abnormal   Collection Time: 11/19/23  9:21 PM  Result Value Ref Range   Retic Ct Pct 7.1 (H) 0.4 - 3.1 %   RBC. 2.22 (L) 4.22 - 5.81 MIL/uL   Retic Count, Absolute 157.6 19.0 - 186.0 K/uL   Immature Retic Fract 30.3 (H) 2.3 - 15.9 %    Comment: Performed at [facility], [address], [place], [place] [address]  Lipase, blood     Status: None   Collection Time: 11/19/23  9:21 PM  Result Value Ref Range   Lipase 31 11 - 51 U/L    Comment: Performed at [facility], [address], [place], [place] [address]  Urinalysis, Routine w reflex microscopic -Urine, Clean Catch     Status: Abnormal   Collection Time: 11/20/23  1:40 AM  Result Value Ref Range  Color, Urine YELLOW YELLOW   APPearance CLEAR CLEAR   Specific Gravity, Urine 1.012 1.005 - 1.030   pH 5.0 5.0 - 8.0   Glucose, UA NEGATIVE NEGATIVE mg/dL   Hgb urine dipstick SMALL (A) NEGATIVE   Bilirubin Urine NEGATIVE NEGATIVE   Ketones, ur NEGATIVE NEGATIVE mg/dL   Protein, ur 409 (A) NEGATIVE mg/dL   Nitrite NEGATIVE NEGATIVE   Leukocytes,Ua NEGATIVE NEGATIVE   RBC / HPF 0-5 0 - 5 RBC/hpf   WBC, UA 0-5 0 - 5 WBC/hpf   Bacteria, UA NONE SEEN NONE SEEN   Squamous Epithelial / HPF 0-5 0 - 5 /HPF   Mucus PRESENT    Hyaline Casts, UA PRESENT     Comment: Performed at [facility], [address] [place], [place] [address]  CBC     Status: Abnormal   Collection Time: 11/20/23  6:45 AM  Result Value Ref Range   WBC 15.1 (H) 4.0 - 10.5 K/uL   RBC 2.13 (L) 4.22 - 5.81 MIL/uL   Hemoglobin 7.9 (L) 13.0 - 17.0 g/dL   HCT 47.8 (L) 29.5 - 62.1 %   MCV 107.5 (H) 80.0 - 100.0 fL   MCH 37.1 (H) 26.0 - 34.0 pg   MCHC 34.5 30.0 - 36.0 g/dL   RDW 30.8 (H) 65.7 - 84.6 %   Platelets 186 150 - 400 K/uL   nRBC 1.6 (H) 0.0 - 0.2 %    Comment: Performed at [facility], [address] [place], [place] [address]  Comprehensive metabolic panel     Status: Abnormal   Collection Time: 11/20/23  6:45 AM  Result Value Ref Range   Sodium 136 135 - 145 mmol/L   Potassium 3.7 3.5 - 5.1 mmol/L   Chloride 104 98 - 111 mmol/L   CO2 25 22 - 32 mmol/L   Glucose, Bld 102 (H) 70 - 99 mg/dL    Comment: Glucose reference range applies only to samples taken after fasting for at least 8 hours.   BUN 12 6 - 20 mg/dL   Creatinine, Ser 2.84 0.61 - 1.24 mg/dL   Calcium 9.2 8.9 - 13.2 mg/dL   Total Protein 8.6 (H) 6.5 - 8.1 g/dL   Albumin 4.0 3.5 - 5.0 g/dL   AST 37  15 - 41 U/L   ALT 22 0 - 44 U/L   Alkaline Phosphatase 77 38 - 126 U/L   Total Bilirubin 2.3 (H) 0.0 - 1.2 mg/dL   GFR, Estimated >44 >01 mL/min    Comment: (NOTE) Calculated using the CKD-EPI Creatinine Equation (2021)    Anion gap 7 5 - 15    Comment: Performed at [facility], [address], [place], [place] [address]  Magnesium     Status: None   Collection Time: 11/20/23  6:45 AM  Result Value Ref Range   Magnesium 2.1 1.7 - 2.4 mg/dL    Comment: Performed at [facility], [address], [place], [place] [address]  Phosphorus     Status: Abnormal   Collection Time: 11/20/23  6:45 AM  Result Value Ref Range   Phosphorus 5.0 (H) 2.5 - 4.6 mg/dL    Comment: Performed at [facility], [address], [place], [place] [address]   US  Abdomen Limited RUQ (LIVER/GB) Result Date: 11/20/2023 CLINICAL DATA:  620109 Intractable abdominal pain [id number] Nausea AND vomiting [id number] EXAM: ULTRASOUND ABDOMEN LIMITED RIGHT UPPER QUADRANT COMPARISON:  November 16, 2023  FINDINGS: Gallbladder: Multiple layering gallstones. No wall thickening visualized. No sonographic Murphy sign noted by sonographer. Common bile duct: Diameter: Visualized portion measures 7 mm, upper limits of normal. This is similar compared to prior CT. The majority is not visualized. Liver: No focal lesion identified. Within normal limits in parenchymal echogenicity. Portal vein is patent on color Doppler imaging with normal direction of blood flow towards the liver. Other: None. IMPRESSION: Cholelithiasis without sonographic evidence of acute cholecystitis. If there is a persistent clinical concern for biliary obstruction, this would be better assessed with dedicated MRI with MRCP. Electronically Signed   By: Clancy Crimes M.D.   On: 11/20/2023 07:46      Assessment/Plan Biliary colic  - CT on 6/1 and 6/11 show cholelithiasis without concern for cholecystitis - RUQ US   6/15 with the same  - Tbili mildly elevated at 2.3 but this is chronic looking back at labs and actually better compared to a few months ago - will check fractionated bilirubin in AM - WBC and LFTs otherwise normal - NPO after MN - hx consistent with biliary colic, this is his 3rd admission in 3 weeks - reasonable to proceed with lap chole tomorrow - I have explained the procedure, risks, and aftercare of Laparoscopic cholecystectomy with IOC.  Risks include but are not limited to anesthesia (MI, CVA, death, prolonged intubation and aspiration), bleeding, infection, wound problems, hernia, bile leak, injury to common bile duct/liver/intestine, possible need for subtotal cholecystectomy or open cholecystectomy, increased risk of DVT/PE and diarrhea post op.  He seems to understand and agrees to proceed.   FEN: NPO after MN  VTE: SCDs ID: [email or web address] on call to OR   I reviewed nursing notes, ED provider notes, hospitalist notes, last 24 h vitals and pain scores, last 48 h intake and output, last 24 h labs and trends, and last 24 h imaging results.  This care required high  level of medical decision making.   Annetta Killian, [facility] 11/21/2023, 3:08 PM Please see Amion for pager number during day hours 7:00am-4:30pm

## 2023-11-21 NOTE — H&P (View-Only) (Signed)
 Consult Note  Manuel Mcguire [birth year]  [id number].    Requesting MD: Sudie Ely, MD Chief Complaint/Reason for Consult: Cholelithiasis, intractable nausea and vomiting HPI:  Patient is a 33 year old male with PMH scoliosis and sickle cell anemia who presented to the hospital 6/14 with intractable nausea and vomiting. He was hospitalized for sickle cell pain crisis 6/11-6/13. Was also seen in the ED 6/1 for nausea and vomiting. Patient reports intractable nausea and vomiting that seems to come on about 30-60 min after eating, although admittedly he sometimes has nausea and vomiting outside of this too. He reports symptoms that were more mild going back to over a year ago but then it was only happening every few months. Episodes seem to be increasing in frequency and intensity. Denies sick contacts or unusual food exposures. He is currently feeling improved today after IV nausea medication but tried taking PO zofran  at home with no relief prior to coming back in. He reports abdominal muscular soreness from vomiting but no other abdominal pain. He is having some bowel function. His father is at bedside. He has had no prior abdominal surgery. He is not on blood thinners and has NKDA. He works as an Art gallery manager.   ROS: Negative other than HPI  Family History  Problem Relation Age of Onset   Diabetes Mother    Hypertension Mother    Sickle cell anemia Sister     Past Medical History:  Diagnosis Date   Scoliosis    Sickle cell anemia with pain (HCC)     History reviewed. No pertinent surgical history.  Social History:  reports that he has never smoked. He does not have any smokeless tobacco history on file. He reports that he does not drink alcohol and does not use drugs.  Allergies: No Known Allergies  Medications Prior to Admission  Medication Sig Dispense Refill   cholecalciferol  (VITAMIN D3) 25 MCG (1000 UNIT) tablet Take 1,000 Units by mouth daily.     folic acid   (FOLVITE ) 1 MG tablet Take 1 tablet (1 mg total) by mouth daily. 30 tablet 11   hydroxyurea  (HYDREA ) 500 MG capsule Take 3 capsules (1,500 mg total) by mouth daily. May take with food to minimize GI side effects. 90 capsule 11   lisinopril  (ZESTRIL ) 2.5 MG tablet Take 2.5 mg by mouth daily.     ondansetron  (ZOFRAN -ODT) 4 MG disintegrating tablet 4mg  ODT q4 hours prn nausea/vomit 10 tablet 0   oxyCODONE  (OXY IR/ROXICODONE ) 5 MG immediate release tablet Take 5 mg by mouth every 6 (six) hours as needed for severe pain.      Blood pressure 114/81, pulse 65, temperature (!) 97.4 F (36.3 C), temperature source Oral, resp. rate 15, height 5' 7 (1.702 m), weight 69.8 kg, SpO2 96%. Physical Exam:  General: pleasant, WD, WN male who is laying in bed in NAD HEENT: head is normocephalic, atraumatic.  Sclera are anicteric.  Pupils equal and round.  Ears and nose without any masses or lesions.  Mouth is pink and moist Heart: regular, rate, and rhythm. Palpable radial and pedal pulses bilaterally Lungs: No wheezes, rhonchi, or rales noted.  Respiratory effort nonlabored Abd: soft, NT, ND, no masses, hernias, or organomegaly MS: all 4 extremities are symmetrical with no cyanosis, clubbing, or edema. Skin: warm and dry with no masses, lesions, or rashes Neuro: Cranial nerves 2-12 grossly intact, sensation is normal throughout Psych: A&Ox3 with an appropriate affect.   Results for orders placed or  performed during the hospital encounter of 11/19/23 (from the past 48 hours)  Comprehensive metabolic panel     Status: Abnormal   Collection Time: 11/19/23  9:21 PM  Result Value Ref Range   Sodium 140 135 - 145 mmol/L   Potassium 4.0 3.5 - 5.1 mmol/L   Chloride 106 98 - 111 mmol/L   CO2 23 22 - 32 mmol/L   Glucose, Bld 113 (H) 70 - 99 mg/dL    Comment: Glucose reference range applies only to samples taken after fasting for at least 8 hours.   BUN 13 6 - 20 mg/dL   Creatinine, Ser 1.19 0.61 - 1.24 mg/dL    Calcium 9.4 8.9 - 14.7 mg/dL   Total Protein 9.0 (H) 6.5 - 8.1 g/dL   Albumin 4.6 3.5 - 5.0 g/dL   AST 44 (H) 15 - 41 U/L   ALT 26 0 - 44 U/L   Alkaline Phosphatase 89 38 - 126 U/L   Total Bilirubin 2.0 (H) 0.0 - 1.2 mg/dL   GFR, Estimated >82 >95 mL/min    Comment: (NOTE) Calculated using the CKD-EPI Creatinine Equation (2021)    Anion gap 11 5 - 15    Comment: Performed at [facility], [address] [place], [place] [address]  CBC with Differential     Status: Abnormal   Collection Time: 11/19/23  9:21 PM  Result Value Ref Range   WBC 11.3 (H) 4.0 - 10.5 K/uL   RBC 2.20 (L) 4.22 - 5.81 MIL/uL   Hemoglobin 8.5 (L) 13.0 - 17.0 g/dL   HCT 86.5 (L) 78.4 - 69.6 %   MCV 103.6 (H) 80.0 - 100.0 fL   MCH 38.6 (H) 26.0 - 34.0 pg   MCHC 37.3 (H) 30.0 - 36.0 g/dL   RDW 29.5 (H) 28.4 - 13.2 %   Platelets 201 150 - 400 K/uL   nRBC 2.4 (H) 0.0 - 0.2 %   Neutrophils Relative % 63 %   Neutro Abs 7.3 1.7 - 7.7 K/uL   Lymphocytes Relative 22 %   Lymphs Abs 2.5 0.7 - 4.0 K/uL   Monocytes Relative 12 %   Monocytes Absolute 1.3 (H) 0.1 - 1.0 K/uL   Eosinophils Relative 1 %   Eosinophils Absolute 0.1 0.0 - 0.5 K/uL   Basophils Relative 1 %   Basophils Absolute 0.1 0.0 - 0.1 K/uL   Immature Granulocytes 1 %   Abs Immature Granulocytes 0.06 0.00 - 0.07 K/uL    Comment: Performed at [facility], [address] [place], [place] [address]  Reticulocytes     Status: Abnormal   Collection Time: 11/19/23  9:21 PM  Result Value Ref Range   Retic Ct Pct 7.1 (H) 0.4 - 3.1 %   RBC. 2.22 (L) 4.22 - 5.81 MIL/uL   Retic Count, Absolute 157.6 19.0 - 186.0 K/uL   Immature Retic Fract 30.3 (H) 2.3 - 15.9 %    Comment: Performed at [facility], [address], [place], [place] [address]  Lipase, blood     Status: None   Collection Time: 11/19/23  9:21 PM  Result Value Ref Range   Lipase 31 11 - 51 U/L    Comment: Performed at [facility], [address], [place], [place] [address]  Urinalysis, Routine w reflex microscopic -Urine, Clean Catch     Status: Abnormal   Collection Time: 11/20/23  1:40 AM  Result Value Ref Range  Color, Urine YELLOW YELLOW   APPearance CLEAR CLEAR   Specific Gravity, Urine 1.012 1.005 - 1.030   pH 5.0 5.0 - 8.0   Glucose, UA NEGATIVE NEGATIVE mg/dL   Hgb urine dipstick SMALL (A) NEGATIVE   Bilirubin Urine NEGATIVE NEGATIVE   Ketones, ur NEGATIVE NEGATIVE mg/dL   Protein, ur 409 (A) NEGATIVE mg/dL   Nitrite NEGATIVE NEGATIVE   Leukocytes,Ua NEGATIVE NEGATIVE   RBC / HPF 0-5 0 - 5 RBC/hpf   WBC, UA 0-5 0 - 5 WBC/hpf   Bacteria, UA NONE SEEN NONE SEEN   Squamous Epithelial / HPF 0-5 0 - 5 /HPF   Mucus PRESENT    Hyaline Casts, UA PRESENT     Comment: Performed at [facility], [address] [place], [place] [address]  CBC     Status: Abnormal   Collection Time: 11/20/23  6:45 AM  Result Value Ref Range   WBC 15.1 (H) 4.0 - 10.5 K/uL   RBC 2.13 (L) 4.22 - 5.81 MIL/uL   Hemoglobin 7.9 (L) 13.0 - 17.0 g/dL   HCT 47.8 (L) 29.5 - 62.1 %   MCV 107.5 (H) 80.0 - 100.0 fL   MCH 37.1 (H) 26.0 - 34.0 pg   MCHC 34.5 30.0 - 36.0 g/dL   RDW 30.8 (H) 65.7 - 84.6 %   Platelets 186 150 - 400 K/uL   nRBC 1.6 (H) 0.0 - 0.2 %    Comment: Performed at [facility], [address] [place], [place] [address]  Comprehensive metabolic panel     Status: Abnormal   Collection Time: 11/20/23  6:45 AM  Result Value Ref Range   Sodium 136 135 - 145 mmol/L   Potassium 3.7 3.5 - 5.1 mmol/L   Chloride 104 98 - 111 mmol/L   CO2 25 22 - 32 mmol/L   Glucose, Bld 102 (H) 70 - 99 mg/dL    Comment: Glucose reference range applies only to samples taken after fasting for at least 8 hours.   BUN 12 6 - 20 mg/dL   Creatinine, Ser 2.84 0.61 - 1.24 mg/dL   Calcium 9.2 8.9 - 13.2 mg/dL   Total Protein 8.6 (H) 6.5 - 8.1 g/dL   Albumin 4.0 3.5 - 5.0 g/dL   AST 37  15 - 41 U/L   ALT 22 0 - 44 U/L   Alkaline Phosphatase 77 38 - 126 U/L   Total Bilirubin 2.3 (H) 0.0 - 1.2 mg/dL   GFR, Estimated >44 >01 mL/min    Comment: (NOTE) Calculated using the CKD-EPI Creatinine Equation (2021)    Anion gap 7 5 - 15    Comment: Performed at [facility], [address], [place], [place] [address]  Magnesium     Status: None   Collection Time: 11/20/23  6:45 AM  Result Value Ref Range   Magnesium 2.1 1.7 - 2.4 mg/dL    Comment: Performed at [facility], [address], [place], [place] [address]  Phosphorus     Status: Abnormal   Collection Time: 11/20/23  6:45 AM  Result Value Ref Range   Phosphorus 5.0 (H) 2.5 - 4.6 mg/dL    Comment: Performed at [facility], [address], [place], [place] [address]   US  Abdomen Limited RUQ (LIVER/GB) Result Date: 11/20/2023 CLINICAL DATA:  620109 Intractable abdominal pain 620109 [id number] Nausea AND vomiting [id number] EXAM: ULTRASOUND ABDOMEN LIMITED RIGHT UPPER QUADRANT COMPARISON:  November 16, 2023  FINDINGS: Gallbladder: Multiple layering gallstones. No wall thickening visualized. No sonographic Murphy sign noted by sonographer. Common bile duct: Diameter: Visualized portion measures 7 mm, upper limits of normal. This is similar compared to prior CT. The majority is not visualized. Liver: No focal lesion identified. Within normal limits in parenchymal echogenicity. Portal vein is patent on color Doppler imaging with normal direction of blood flow towards the liver. Other: None. IMPRESSION: Cholelithiasis without sonographic evidence of acute cholecystitis. If there is a persistent clinical concern for biliary obstruction, this would be better assessed with dedicated MRI with MRCP. Electronically Signed   By: Clancy Crimes M.D.   On: 11/20/2023 07:46      Assessment/Plan Biliary colic  - CT on 6/1 and 6/11 show cholelithiasis without concern for cholecystitis - RUQ US   6/15 with the same  - Tbili mildly elevated at 2.3 but this is chronic looking back at labs and actually better compared to a few months ago - will check fractionated bilirubin in AM - WBC and LFTs otherwise normal - NPO after MN - hx consistent with biliary colic, this is his 3rd admission in 3 weeks - reasonable to proceed with lap chole tomorrow - I have explained the procedure, risks, and aftercare of Laparoscopic cholecystectomy with IOC.  Risks include but are not limited to anesthesia (MI, CVA, death, prolonged intubation and aspiration), bleeding, infection, wound problems, hernia, bile leak, injury to common bile duct/liver/intestine, possible need for subtotal cholecystectomy or open cholecystectomy, increased risk of DVT/PE and diarrhea post op.  He seems to understand and agrees to proceed.   FEN: NPO after MN  VTE: SCDs ID: [email or web address] on call to OR   I reviewed nursing notes, ED provider notes, hospitalist notes, last 24 h vitals and pain scores, last 48 h intake and output, last 24 h labs and trends, and last 24 h imaging results.  This care required high  level of medical decision making.   Annetta Killian, [facility] 11/21/2023, 3:08 PM Please see Amion for pager number during day hours 7:00am-4:30pm

## 2023-11-21 NOTE — Plan of Care (Signed)

## 2023-11-21 NOTE — Plan of Care (Signed)
  Problem: Clinical Measurements: Goal: Will remain free from infection Outcome: Progressing   Problem: Activity: Goal: Risk for activity intolerance will decrease Outcome: Progressing   Problem: Elimination: Goal: Will not experience complications related to bowel motility Outcome: Progressing Goal: Will not experience complications related to urinary retention Outcome: Progressing   Problem: Pain Managment: Goal: General experience of comfort will improve and/or be controlled Outcome: Progressing   Problem: Safety: Goal: Ability to remain free from injury will improve Outcome: Progressing

## 2023-11-21 NOTE — Progress Notes (Signed)
 Patient ID: Manuel Mcguire, male   DOB: 05-Nov-1990, 33 y.o.   MRN: [id number] Subjective: Manuel Mcguire is a 33 year old male with a medical history significant for sickle cell disease was admitted for sickle cell pain crisis also having abdominal pain with intractable nausea with vomiting.   Patient continues to report pain in right upper quadrant of 7/10. Worsening nausea. He denies vomiting, fever, headache, SOB, cough. No urinary symptoms.    Objective:  Vital signs in last 24 hours:  Vitals:   11/21/23 1033 11/21/23 1136 11/21/23 1428 11/21/23 1648  BP: 127/88  114/81   Pulse: 74  65   Resp: 15 16 15 16   Temp: 98.1 F (36.7 C)  (!) 97.4 F (36.3 C)   TempSrc: Oral  Oral   SpO2: 96%  96%   Weight:      Height:        Intake/Output from previous day:   Intake/Output Summary (Last 24 hours) at 11/21/2023 1702 Last data filed at 11/21/2023 1345 Gross per 24 hour  Intake 3458.85 ml  Output 2400 ml  Net 1058.85 ml    Physical Exam: General: Alert, awake, oriented x3, in no acute distress.  HEENT: [place]/AT PEERL, EOMI Neck: Trachea midline,  no masses, no thyromegal,y no JVD, no carotid bruit OROPHARYNX:  Moist, No exudate/ erythema/lesions.  Heart: Regular rate and rhythm, without murmurs, rubs, gallops, PMI non-displaced, no heaves or thrills on palpation.  Lungs: Clear to auscultation, no wheezing or rhonchi noted. No increased vocal fremitus resonant to percussion  Abdomen: Right upper quadrant tenderness.  Neuro: No focal neurological deficits noted cranial nerves II through XII grossly intact. DTRs 2+ bilaterally upper and lower extremities. Strength 5 out of 5 in bilateral upper and lower extremities. Musculoskeletal: No warm swelling or erythema around joints, no spinal tenderness noted. Psychiatric: Patient alert and oriented x3, good insight and cognition, good recent to remote recall. Lymph node survey: No cervical axillary or inguinal lymphadenopathy noted.  Lab  Results:  Basic Metabolic Panel:    Component Value Date/Time   NA 136 11/20/2023 0645   K 3.7 11/20/2023 0645   CL 104 11/20/2023 0645   CO2 25 11/20/2023 0645   BUN 12 11/20/2023 0645   CREATININE 0.94 11/20/2023 0645   GLUCOSE 102 (H) 11/20/2023 0645   CALCIUM 9.2 11/20/2023 0645   CBC:    Component Value Date/Time   WBC 15.1 (H) 11/20/2023 0645   HGB 7.9 (L) 11/20/2023 0645   HCT 22.9 (L) 11/20/2023 0645   PLT 186 11/20/2023 0645   MCV 107.5 (H) 11/20/2023 0645   NEUTROABS 7.3 11/19/2023 2121   LYMPHSABS 2.5 11/19/2023 2121   MONOABS 1.3 (H) 11/19/2023 2121   EOSABS 0.1 11/19/2023 2121   BASOSABS 0.1 11/19/2023 2121    No results found for this or any previous visit (from the past 240 hours).  Studies/Results: US  Abdomen Limited RUQ (LIVER/GB) Result Date: 11/20/2023 CLINICAL DATA:  [id number] Intractable abdominal pain [id number] Nausea AND vomiting [id number] EXAM: ULTRASOUND ABDOMEN LIMITED RIGHT UPPER QUADRANT COMPARISON:  November 16, 2023 FINDINGS: Gallbladder: Multiple layering gallstones. No wall thickening visualized. No sonographic Murphy sign noted by sonographer. Common bile duct: Diameter: Visualized portion measures 7 mm, upper limits of normal. This is similar compared to prior CT. The majority is not visualized. Liver: No focal lesion identified. Within normal limits in parenchymal echogenicity. Portal vein is patent on color Doppler imaging with normal direction of blood flow towards the liver. Other: None.  IMPRESSION: Cholelithiasis without sonographic evidence of acute cholecystitis. If there is a persistent clinical concern for biliary obstruction, this would be better assessed with dedicated MRI with MRCP. Electronically Signed   By: Clancy Crimes M.D.   On: 11/20/2023 07:46    Medications: Scheduled Meds:  cholecalciferol   1,000 Units Oral Daily   enoxaparin  (LOVENOX ) injection  40 mg Subcutaneous Q24H   folic acid   1 mg Oral Daily   HYDROmorphone     Intravenous Q4H   hydroxyurea   1,500 mg Oral Daily   [START ON 11/22/2023] indocyanine green  1.25 mg Intravenous Once   Continuous Infusions:  [START ON 11/22/2023]  ceFAZolin (ANCEF) IV     PRN Meds:.acetaminophen , diphenhydrAMINE , melatonin, naloxone  **AND** sodium chloride  flush, ondansetron , oxyCODONE , polyethylene glycol  Consultants: Surgical Consult  Procedures: None  Antibiotics: None  Assessment/Plan: Principal Problem:   Sickle cell pain crisis (HCC) Active Problems:   Nausea vomiting and diarrhea   Anemia of chronic disease   Intractable Nausea & Vomiting: Vomiting revolved, worsening Nausea, continue Zofran , surgical consult completed. The plan is Cholecystectomy in the AM.  Hb Sickle Cell Disease with Pain crisis: Continue IVF 0.45% Saline @125  mls/hour, continue weight based Dilaudid  PCA, IV Toradol  15 mg Q 6 H for a total of 5 days, continue oral home pain medications as ordered. Monitor vitals very closely, Re-evaluate pain scale regularly, 2 L of Oxygen by [place]. Patient encouraged to ambulate on the hallway today.  Anemia of Chronic Disease: slightly lower than patients baseline. No clinical indication for  transfusion. Will continue to monitor daily CBC Chronic pain Syndrome: Continue oral pain medication   Code Status: Full Code Family Communication: N/A Disposition Plan: Not yet ready for discharge  Lorel Roes NP   If 7PM-7AM, please contact night-coverage.  11/21/2023, 5:02 PM  LOS: 1 day

## 2023-11-22 ENCOUNTER — Encounter (HOSPITAL_COMMUNITY): Payer: Self-pay | Admitting: Internal Medicine

## 2023-11-22 ENCOUNTER — Inpatient Hospital Stay (HOSPITAL_COMMUNITY): Admitting: Certified Registered Nurse Anesthetist

## 2023-11-22 ENCOUNTER — Other Ambulatory Visit: Payer: Self-pay

## 2023-11-22 ENCOUNTER — Encounter (HOSPITAL_COMMUNITY)
Admission: EM | Disposition: A | Discharge status: Home / Self Care | Payer: Self-pay | Source: Home / Self Care | Attending: Internal Medicine

## 2023-11-22 DIAGNOSIS — K8050 Calculus of bile duct without cholangitis or cholecystitis without obstruction: Secondary | ICD-10-CM | POA: Diagnosis not present

## 2023-11-22 DIAGNOSIS — K8010 Calculus of gallbladder with chronic cholecystitis without obstruction: Secondary | ICD-10-CM | POA: Diagnosis not present

## 2023-11-22 HISTORY — PX: CHOLECYSTECTOMY: SHX55

## 2023-11-22 LAB — COMPREHENSIVE METABOLIC PANEL WITH GFR
ALT: 23 U/L (ref 0–44)
AST: 44 U/L — ABNORMAL HIGH (ref 15–41)
Albumin: 3.8 g/dL (ref 3.5–5.0)
Alkaline Phosphatase: 79 U/L (ref 38–126)
Anion gap: 9 (ref 5–15)
BUN: 11 mg/dL (ref 6–20)
CO2: 25 mmol/L (ref 22–32)
Calcium: 9 mg/dL (ref 8.9–10.3)
Chloride: 103 mmol/L (ref 98–111)
Creatinine, Ser: 0.66 mg/dL (ref 0.61–1.24)
GFR, Estimated: >60 mL/min (ref >60)
Glucose, Bld: 89 mg/dL (ref 70–99)
Potassium: 4.4 mmol/L (ref 3.5–5.1)
Sodium: 137 mmol/L (ref 135–145)
Total Bilirubin: 1.6 mg/dL — ABNORMAL HIGH (ref 0.0–1.2)
Total Protein: 7.8 g/dL (ref 6.5–8.1)

## 2023-11-22 LAB — CBC
HCT: 21.7 % — ABNORMAL LOW (ref 39.0–52.0)
Hemoglobin: 7.7 g/dL — ABNORMAL LOW (ref 13.0–17.0)
MCH: 37.9 pg — ABNORMAL HIGH (ref 26.0–34.0)
MCHC: 35.5 g/dL (ref 30.0–36.0)
MCV: 106.9 fL — ABNORMAL HIGH (ref 80.0–100.0)
Platelets: 162 10*3/uL (ref 150–400)
RBC: 2.03 MIL/uL — ABNORMAL LOW (ref 4.22–5.81)
RDW: 20.2 % — ABNORMAL HIGH (ref 11.5–15.5)
WBC: 13.3 10*3/uL — ABNORMAL HIGH (ref 4.0–10.5)
nRBC: 3.8 % — ABNORMAL HIGH (ref 0.0–0.2)

## 2023-11-22 LAB — TYPE AND SCREEN
ABO/RH(D): A POS
Antibody Screen: NEGATIVE

## 2023-11-22 LAB — SURGICAL PCR SCREEN
MRSA, PCR: NEGATIVE
Staphylococcus aureus: POSITIVE — AB

## 2023-11-22 SURGERY — LAPAROSCOPIC CHOLECYSTECTOMY WITH INTRAOPERATIVE CHOLANGIOGRAM
Anesthesia: General

## 2023-11-22 MED ORDER — ONDANSETRON HCL 4 MG/2ML IJ SOLN
INTRAMUSCULAR | Status: AC
  Filled 2023-11-22: qty 2

## 2023-11-22 MED ORDER — ROCURONIUM BROMIDE 10 MG/ML (PF) SYRINGE
PREFILLED_SYRINGE | INTRAVENOUS | Status: AC
  Filled 2023-11-22: qty 10

## 2023-11-22 MED ORDER — MUPIROCIN 2 % EX OINT
1.0000 | TOPICAL_OINTMENT | Freq: Two times a day (BID) | CUTANEOUS | Status: DC
Start: 2023-11-22 — End: 2023-11-25
  Administered 2023-11-22 – 2023-11-23 (×3): 1 via NASAL
  Filled 2023-11-22: qty 22

## 2023-11-22 MED ORDER — PROPOFOL 10 MG/ML IV BOLUS
INTRAVENOUS | Status: AC
  Filled 2023-11-22: qty 20

## 2023-11-22 MED ORDER — BUPIVACAINE-EPINEPHRINE (PF) 0.25% -1:200000 IJ SOLN
INTRAMUSCULAR | Status: DC | PRN
  Administered 2023-11-22: 50 mL

## 2023-11-22 MED ORDER — MIDAZOLAM HCL 2 MG/2ML IJ SOLN
INTRAMUSCULAR | Status: AC
  Filled 2023-11-22: qty 2

## 2023-11-22 MED ORDER — KETAMINE HCL 10 MG/ML IJ SOLN
INTRAMUSCULAR | Status: DC | PRN
Start: 2023-11-22 — End: 2023-11-22
  Administered 2023-11-22: 20 mg via INTRAVENOUS

## 2023-11-22 MED ORDER — SODIUM CHLORIDE 0.9 % IV SOLN: 12.5000 mg | INTRAVENOUS | Status: DC | PRN

## 2023-11-22 MED ORDER — HYDROMORPHONE HCL 1 MG/ML IJ SOLN
INTRAMUSCULAR | Status: AC
  Filled 2023-11-22: qty 2

## 2023-11-22 MED ORDER — LACTATED RINGERS IV SOLN: INTRAVENOUS | Status: DC

## 2023-11-22 MED ORDER — PLASMA-LYTE A IV SOLN
INTRAVENOUS | Status: DC
  Administered 2023-11-22: 200 mL/h via INTRAVENOUS
  Filled 2023-11-22: qty 1000

## 2023-11-22 MED ORDER — PROPOFOL 10 MG/ML IV BOLUS
INTRAVENOUS | Status: DC | PRN
  Administered 2023-11-22: 150 mg via INTRAVENOUS

## 2023-11-22 MED ORDER — SODIUM CHLORIDE 0.45 % IV SOLN: INTRAVENOUS | Status: AC

## 2023-11-22 MED ORDER — DEXAMETHASONE SODIUM PHOSPHATE 10 MG/ML IJ SOLN
INTRAMUSCULAR | Status: DC | PRN
  Administered 2023-11-22: 5 mg via INTRAVENOUS

## 2023-11-22 MED ORDER — DEXAMETHASONE SODIUM PHOSPHATE 10 MG/ML IJ SOLN
INTRAMUSCULAR | Status: AC
  Filled 2023-11-22: qty 1

## 2023-11-22 MED ORDER — LACTATED RINGERS IR SOLN
Status: DC | PRN
  Administered 2023-11-22: 1000 mL

## 2023-11-22 MED ORDER — LIDOCAINE HCL (PF) 2 % IJ SOLN
INTRAMUSCULAR | Status: AC
  Filled 2023-11-22: qty 5

## 2023-11-22 MED ORDER — FENTANYL CITRATE PF 50 MCG/ML IJ SOSY
25.0000 ug | PREFILLED_SYRINGE | INTRAMUSCULAR | Status: DC | PRN
  Administered 2023-11-22 (×3): 50 ug via INTRAVENOUS

## 2023-11-22 MED ORDER — HYDROMORPHONE HCL 2 MG/ML IJ SOLN
INTRAMUSCULAR | Status: AC
  Filled 2023-11-22: qty 1

## 2023-11-22 MED ORDER — PHENYLEPHRINE 80 MCG/ML (10ML) SYRINGE FOR IV PUSH (FOR BLOOD PRESSURE SUPPORT)
PREFILLED_SYRINGE | INTRAVENOUS | Status: DC | PRN
  Administered 2023-11-22 (×2): 160 ug via INTRAVENOUS

## 2023-11-22 MED ORDER — ORAL CARE MOUTH RINSE: 15.0000 mL | Freq: Once | OROMUCOSAL | Status: AC

## 2023-11-22 MED ORDER — LIDOCAINE HCL (PF) 2 % IJ SOLN
INTRAMUSCULAR | Status: DC | PRN
  Administered 2023-11-22: 60 mg via INTRADERMAL

## 2023-11-22 MED ORDER — STERILE WATER FOR IRRIGATION IR SOLN
Status: DC | PRN
  Administered 2023-11-22: 1000 mL

## 2023-11-22 MED ORDER — BUPIVACAINE-EPINEPHRINE (PF) 0.25% -1:200000 IJ SOLN
INTRAMUSCULAR | Status: AC
  Filled 2023-11-22: qty 30

## 2023-11-22 MED ORDER — FENTANYL CITRATE PF 50 MCG/ML IJ SOSY
PREFILLED_SYRINGE | INTRAMUSCULAR | Status: AC
  Filled 2023-11-22: qty 1

## 2023-11-22 MED ORDER — HYDROMORPHONE HCL 1 MG/ML IJ SOLN
0.2500 mg | INTRAMUSCULAR | Status: DC | PRN
  Administered 2023-11-22 (×4): 0.5 mg via INTRAVENOUS

## 2023-11-22 MED ORDER — PHENYLEPHRINE HCL-NACL 20-0.9 MG/250ML-% IV SOLN
INTRAVENOUS | Status: DC | PRN
  Administered 2023-11-22: 25 ug/min via INTRAVENOUS

## 2023-11-22 MED ORDER — MIDAZOLAM HCL 5 MG/5ML IJ SOLN
INTRAMUSCULAR | Status: DC | PRN
  Administered 2023-11-22: 2 mg via INTRAVENOUS

## 2023-11-22 MED ORDER — LACTATED RINGERS IV SOLN: INTRAVENOUS | Status: DC | PRN

## 2023-11-22 MED ORDER — KETAMINE HCL 50 MG/5ML IJ SOSY
PREFILLED_SYRINGE | INTRAMUSCULAR | Status: AC
  Filled 2023-11-22: qty 5

## 2023-11-22 MED ORDER — CHLORHEXIDINE GLUCONATE 0.12 % MT SOLN
15.0000 mL | Freq: Once | OROMUCOSAL | Status: AC
  Administered 2023-11-22: 15 mL via OROMUCOSAL

## 2023-11-22 MED ORDER — 0.9 % SODIUM CHLORIDE (POUR BTL) OPTIME
TOPICAL | Status: DC | PRN
  Administered 2023-11-22: 1000 mL

## 2023-11-22 MED ORDER — ROCURONIUM BROMIDE 10 MG/ML (PF) SYRINGE
PREFILLED_SYRINGE | INTRAVENOUS | Status: DC | PRN
  Administered 2023-11-22: 50 mg via INTRAVENOUS

## 2023-11-22 MED ORDER — HYDROMORPHONE HCL 1 MG/ML IJ SOLN
INTRAMUSCULAR | Status: DC | PRN
  Administered 2023-11-22: .2 mg via INTRAVENOUS

## 2023-11-22 MED ORDER — FENTANYL CITRATE PF 50 MCG/ML IJ SOSY
PREFILLED_SYRINGE | INTRAMUSCULAR | Status: AC
  Filled 2023-11-22: qty 2

## 2023-11-22 MED ORDER — SUGAMMADEX SODIUM 200 MG/2ML IV SOLN
INTRAVENOUS | Status: DC | PRN
  Administered 2023-11-22: 200 mg via INTRAVENOUS

## 2023-11-22 MED ORDER — BUPIVACAINE LIPOSOME 1.3 % IJ SUSP
INTRAMUSCULAR | Status: AC
  Filled 2023-11-22: qty 20

## 2023-11-22 SURGICAL SUPPLY — 36 items
BAG COUNTER SPONGE SURGICOUNT (BAG) ×1 IMPLANT
BENZOIN TINCTURE PRP APPL 2/3 (GAUZE/BANDAGES/DRESSINGS) IMPLANT
BNDG ADH 1X3 SHEER STRL LF (GAUZE/BANDAGES/DRESSINGS) IMPLANT
CABLE HIGH FREQUENCY MONO STRZ (ELECTRODE) ×1 IMPLANT
CHLORAPREP W/TINT 26 (MISCELLANEOUS) ×1 IMPLANT
CLIP APPLIE ROT 10 11.4 M/L (STAPLE) ×1 IMPLANT
COVER MAYO STAND XLG (MISCELLANEOUS) IMPLANT
COVER SURGICAL LIGHT HANDLE (MISCELLANEOUS) ×1 IMPLANT
DERMABOND ADVANCED .7 DNX12 (GAUZE/BANDAGES/DRESSINGS) IMPLANT
DRAPE C-ARM 42X120 X-RAY (DRAPES) IMPLANT
ELECT REM PT RETURN 15FT ADLT (MISCELLANEOUS) ×1 IMPLANT
ENDOLOOP SUT PDS II 0 18 (SUTURE) IMPLANT
GLOVE BIO SURGEON STRL SZ 6 (GLOVE) ×1 IMPLANT
GLOVE INDICATOR 6.5 STRL GRN (GLOVE) ×1 IMPLANT
GOWN STRL REUS W/ TWL LRG LVL3 (GOWN DISPOSABLE) ×1 IMPLANT
GRASPER SUT TROCAR 14GX15 (MISCELLANEOUS) ×1 IMPLANT
HEMOSTAT SNOW SURGICEL 2X4 (HEMOSTASIS) IMPLANT
IRRIGATION SUCT STRKRFLW 2 WTP (MISCELLANEOUS) ×1 IMPLANT
KIT BASIN OR (CUSTOM PROCEDURE TRAY) ×1 IMPLANT
KIT TURNOVER KIT A (KITS) ×2 IMPLANT
NDL INSUFFLATION 14GA 120MM (NEEDLE) ×1 IMPLANT
NEEDLE INSUFFLATION 14GA 120MM (NEEDLE) ×1 IMPLANT
SCISSORS LAP 5X35 DISP (ENDOMECHANICALS) ×1 IMPLANT
SET CHOLANGIOGRAPH MIX (MISCELLANEOUS) IMPLANT
SET TUBE SMOKE EVAC HIGH FLOW (TUBING) ×1 IMPLANT
SLEEVE Z-THREAD 5X100MM (TROCAR) ×1 IMPLANT
SPIKE FLUID TRANSFER (MISCELLANEOUS) ×1 IMPLANT
STRIP CLOSURE SKIN 1/2X4 (GAUZE/BANDAGES/DRESSINGS) IMPLANT
SUT MNCRL AB 4-0 PS2 18 (SUTURE) ×1 IMPLANT
SUT VICRYL 0 TIES 12 18 (SUTURE) IMPLANT
SYSTEM BAG RETRIEVAL 10MM (BASKET) IMPLANT
TOWEL OR 17X26 10 PK STRL BLUE (TOWEL DISPOSABLE) ×1 IMPLANT
TRAY LAPAROSCOPIC (CUSTOM PROCEDURE TRAY) ×1 IMPLANT
TROCAR ADV FIXATION 12X100MM (TROCAR) ×1 IMPLANT
TROCAR XCEL NON-BLD 5MMX100MML (ENDOMECHANICALS) IMPLANT
TROCAR Z-THREAD OPTICAL 5X100M (TROCAR) ×1 IMPLANT

## 2023-11-22 NOTE — Transfer of Care (Signed)
 Immediate Anesthesia Transfer of Care Note  Patient: Manuel Mcguire  Procedure(s) Performed: LAPAROSCOPIC CHOLECYSTECTOMY WITH INTRAOPERATIVE CHOLANGIOGRAM  Patient Location: PACU  Anesthesia Type:General  Level of Consciousness: drowsy  Airway & Oxygen Therapy: Patient Spontanous Breathing and Patient connected to face mask oxygen  Post-op Assessment: Report given to RN and Post -op Vital signs reviewed and stable  Post vital signs: Reviewed and stable  Last Vitals:  Vitals Value Taken Time  BP 137/76 11/22/23 12:18  Temp    Pulse 84 11/22/23 12:22  Resp 24 11/22/23 12:22  SpO2 100 % 11/22/23 12:22  Vitals shown include unfiled device data.  Last Pain:  Vitals:   11/22/23 0900  TempSrc: Oral  PainSc: 6       Patients Stated Pain Goal: 2 (11/21/23 1610)  Complications: No notable events documented.

## 2023-11-22 NOTE — Interval H&P Note (Signed)
 History and Physical Interval Note:  11/22/2023 9:19 AM  Manuel Mcguire  has presented today for surgery, with the diagnosis of Biliary colic.  The various methods of treatment have been discussed with the patient and family. After consideration of risks, benefits and other options for treatment, the patient has consented to  Procedure(s) with comments: LAPAROSCOPIC CHOLECYSTECTOMY WITH INTRAOPERATIVE CHOLANGIOGRAM (N/A) - Poss IOC, ICG as a surgical intervention.  The patient's history has been reviewed, patient examined, no change in status, stable for surgery.  I have reviewed the patient's chart and labs.  Questions were answered to the patient's satisfaction.     Jasraj Lappe Loyola Rummage

## 2023-11-22 NOTE — Progress Notes (Signed)
 Patient ID: Manuel Mcguire, male   DOB: [birth year], 33 y.o.   MRN: [id number] Subjective: Manuel Mcguire is a 33 year old male with a medical history significant for sickle cell disease was admitted for sickle cell pain crisis also having abdominal pain with intractable nausea with vomiting.   Patient is lethargic and unable to answer questions, he just completed a cholecystectomy procedure and brought up to the unit.  Mom and dad are at the bedside. Objective:  Vital signs in last 24 hours:  Vitals:   11/22/23 1400 11/22/23 1415 11/22/23 1451 11/22/23 1509  BP: 121/85 125/81 (!) 146/100   Pulse: 80 91 79   Resp: 15 18 16 16   Temp:   97.7 F (36.5 C)   TempSrc:   Oral   SpO2: 99% 100% 100% 100%  Weight:      Height:        Intake/Output from previous day:   Intake/Output Summary (Last 24 hours) at 11/22/2023 1620 Last data filed at 11/22/2023 1423 Gross per 24 hour  Intake 920 ml  Output 1000 ml  Net -80 ml    Physical Exam:  Partial physical assessment completed due to patient being S/P surgery   General: Lethargic   Heart: Regular rate and rhythm, without murmurs, rubs, gallops, PMI non-displaced, no heaves or thrills on palpation.  Lungs: Clear to auscultation, no wheezing or rhonchi noted. No increased vocal fremitus resonant to percussion  Abdomen:  tenderness Neuro:   Lab Results:  Basic Metabolic Panel:    Component Value Date/Time   NA 137 11/22/2023 0610   K 4.4 11/22/2023 0610   CL 103 11/22/2023 0610   CO2 25 11/22/2023 0610   BUN 11 11/22/2023 0610   CREATININE 0.66 11/22/2023 0610   GLUCOSE 89 11/22/2023 0610   CALCIUM 9.0 11/22/2023 0610   CBC:    Component Value Date/Time   WBC 13.3 (H) 11/22/2023 0610   HGB 7.7 (L) 11/22/2023 0610   HCT 21.7 (L) 11/22/2023 0610   PLT 162 11/22/2023 0610   MCV 106.9 (H) 11/22/2023 0610   NEUTROABS 7.3 11/19/2023 2121   LYMPHSABS 2.5 11/19/2023 2121   MONOABS 1.3 (H) 11/19/2023 2121   EOSABS 0.1  11/19/2023 2121   BASOSABS 0.1 11/19/2023 2121    Recent Results (from the past 240 hours)  Surgical PCR screen     Status: Abnormal   Collection Time: 11/22/23  7:36 AM   Specimen: Nasal Mucosa; Nasal Swab  Result Value Ref Range Status   MRSA, PCR NEGATIVE NEGATIVE Final   Staphylococcus aureus POSITIVE (A) NEGATIVE Final    Comment: (NOTE) The Xpert SA Assay (FDA approved for NASAL specimens in patients 49 years of age and older), is one component of a comprehensive surveillance program. It is not intended to diagnose infection nor to guide or monitor treatment. Performed at [facility], [address], [place], [place] [address]     Studies/Results: No results found.   Medications: Scheduled Meds:  cholecalciferol   1,000 Units Oral Daily   enoxaparin  (LOVENOX ) injection  40 mg Subcutaneous Q24H   folic acid   1 mg Oral Daily   HYDROmorphone    Intravenous Q4H   hydroxyurea   1,500 mg Oral Daily   mupirocin ointment  1 Application Nasal BID   Continuous Infusions:  sodium chloride  100 mL/hr at 11/22/23 1110   electrolyte-A Stopped (11/22/23 1205)   PRN Meds:.acetaminophen , diphenhydrAMINE , melatonin, naloxone  **AND** sodium chloride  flush, ondansetron , oxyCODONE , polyethylene glycol  Consultants: Surgical  Consult  Procedures: Laparoscopic cholecystectomy. Antibiotics: None  Assessment/Plan: Principal Problem:   Sickle cell pain crisis (HCC) Active Problems:   Nausea vomiting and diarrhea   Anemia of chronic disease   Intractable Nausea & Vomiting: Vomiting revolved, worsening Nausea, continue Zofran , surgical consult completed.  Cholecystectomy procedure completed without complication. Hb Sickle Cell Disease with Pain crisis: Continue IVF 0.45% Saline KVO, continue weight based Dilaudid  PCA, IV Toradol  15 mg Q 6 H for a total of 5 days, continue oral home pain medications as ordered. Monitor vitals very closely, Re-evaluate pain scale  regularly, 2 L of Oxygen by [place]. Patient encouraged to ambulate on the hallway today.  Anemia of Chronic Disease: slightly lower than patients baseline. No clinical indication for  transfusion. Will continue to monitor daily CBC Chronic pain Syndrome: Continue oral pain medication   Code Status: Full Code Family Communication: N/A Disposition Plan: Not yet ready for discharge  Lorel Roes NP   If 7PM-7AM, please contact night-coverage.  11/22/2023, 4:20 PM  LOS: 2 days

## 2023-11-22 NOTE — Discharge Instructions (Signed)

## 2023-11-22 NOTE — Anesthesia Procedure Notes (Signed)
 Procedure Name: Intubation Date/Time: 11/22/2023 11:09 AM  Performed by: Jenene Miser, CRNAPre-anesthesia Checklist: Patient identified, Emergency Drugs available, Suction available and Patient being monitored Patient Re-evaluated:Patient Re-evaluated prior to induction Oxygen Delivery Method: Circle system utilized Preoxygenation: Pre-oxygenation with 100% oxygen Induction Type: IV induction Ventilation: Mask ventilation without difficulty Laryngoscope Size: Mac and 4 Grade View: Grade I Tube type: Oral Tube size: 7.5 mm Number of attempts: 1 Airway Equipment and Method: Stylet Placement Confirmation: ETT inserted through vocal cords under direct vision, positive ETCO2 and breath sounds checked- equal and bilateral Secured at: 21 cm Tube secured with: Tape Dental Injury: Teeth and Oropharynx as per pre-operative assessment

## 2023-11-22 NOTE — Plan of Care (Signed)

## 2023-11-22 NOTE — Anesthesia Preprocedure Evaluation (Signed)
 Anesthesia Evaluation  Patient identified by MRN, date of birth, ID band Patient awake    Reviewed: Allergy & Precautions, NPO status , Patient's Chart, lab work & pertinent test results  Airway Mallampati: II  TM Distance: >3 FB Neck ROM: Full    Dental  (+) Dental Advisory Given   Pulmonary neg pulmonary ROS   breath sounds clear to auscultation       Cardiovascular negative cardio ROS  Rhythm:Regular Rate:Normal     Neuro/Psych negative neurological ROS     GI/Hepatic negative GI ROS, Neg liver ROS,,,  Endo/Other  negative endocrine ROS    Renal/GU negative Renal ROS     Musculoskeletal   Abdominal   Peds  Hematology  (+) Blood dyscrasia, Sickle cell anemia and anemia   Anesthesia Other Findings   Reproductive/Obstetrics                             Anesthesia Physical Anesthesia Plan  ASA: 3  Anesthesia Plan: General   Post-op Pain Management: Tylenol  PO (pre-op)*, Dilaudid  IV and Ketamine IV*   Induction: Intravenous  PONV Risk Score and Plan: 2 and Dexamethasone, Ondansetron , Midazolam and Treatment may vary due to age or medical condition  Airway Management Planned: Oral ETT  Additional Equipment:   Intra-op Plan:   Post-operative Plan: Extubation in OR  Informed Consent: I have reviewed the patients History and Physical, chart, labs and discussed the procedure including the risks, benefits and alternatives for the proposed anesthesia with the patient or authorized representative who has indicated his/her understanding and acceptance.     Dental advisory given  Plan Discussed with: CRNA  Anesthesia Plan Comments:        Anesthesia Quick Evaluation

## 2023-11-22 NOTE — Op Note (Signed)
 Operative Note  Manuel Mcguire 33 y.o. male [id number]  11/22/2023  Surgeon: Adalberto Acton MD FACS  Procedure performed: Laparoscopic Cholecystectomy with near-infrared fluorescent cholangiography  Procedure classification: emergent  Preop diagnosis: biliary colic Post-op diagnosis/intraop findings: same  Specimens: gallbladder  Retained items: none  EBL: minimal  Complications: none  Description of procedure: After confirming informed consent the patient was brought to the operating room. Antibiotics were administered. SCD's were applied. General endotracheal anesthesia was initiated and a formal time-out was performed. The abdomen was prepped and draped in the usual sterile fashion and the abdomen was entered using an infraumbilical veress needle after instilling the site with local. Insufflation to was obtained, 5mm trocar and camera inserted, and gross inspection revealed no evidence of injury from our entry or other intraabdominal abnormalities. Two 5mm trocars were introduced in the right midclavicular and right anterior axillary lines under direct visualization and following infiltration with local (exparel mixed with 0.25% marcaine with epinephrine). A 12mm trocar was placed in the epigastrium.  The gallbladder fundus was retracted cephalad and the infundibulum was retracted laterally. A combination of hook electrocautery and blunt dissection was utilized to clear the peritoneum from the neck and cystic duct, circumferentially isolating the cystic artery and cystic duct and lifting the gallbladder from the cystic plate. The critical view of safety was achieved with the cystic artery, cystic duct, and liver bed visualized between them with no other structures. Near-infrared fluorescent cholangiography was then employed demonstrating concordant anatomy. This also illuminates the common bile duct medially which is well away from the area of dissection. The cystic artery was  clipped with a single clip proximally and distally and divided as was the cystic duct with three clips on the proximal end. The gallbladder was dissected from the liver plate using electrocautery. A very small strand of thickened peritoneum along the distal anterior gallbladder bed near the fundus was clipped before dividing in case there was any accessory duct present.  Once freed the gallbladder was placed in an endocatch bag and removed intact through the epigastric trocar site. The right upper quadrant was carefully inspected; hemostasis was once again confirmed, and reinspection of the abdomen revealed no injuries. The clips were well apposed without any bile leak from the ligated remnant cystic duct or the liver bed either on direct visualization nor with near-infrared fluorescent cholangiography. The remainder of the abdomen was without injury or abnormality; specifically the anterior surface of the stomach and duodenum appeared normal. The 12mm trocar site in the epigastrium was closed with a 0 vicryl in the fascia under direct visualization using a PMI device. The abdomen was desufflated and all trocars removed. The skin incisions were closed with subcuticular 4-0 monocryl and Dermabond. The patient was awakened, extubated and transported to the recovery room in stable condition.    All counts were correct at the completion of the case.

## 2023-11-23 ENCOUNTER — Encounter (HOSPITAL_COMMUNITY): Payer: Self-pay | Admitting: Surgery

## 2023-11-23 DIAGNOSIS — K5900 Constipation, unspecified: Secondary | ICD-10-CM | POA: Diagnosis present

## 2023-11-23 LAB — SURGICAL PATHOLOGY

## 2023-11-23 LAB — CBC
HCT: 21.5 % — ABNORMAL LOW (ref 39.0–52.0)
Hemoglobin: 7.7 g/dL — ABNORMAL LOW (ref 13.0–17.0)
MCH: 38.3 pg — ABNORMAL HIGH (ref 26.0–34.0)
MCHC: 35.8 g/dL (ref 30.0–36.0)
MCV: 107 fL — ABNORMAL HIGH (ref 80.0–100.0)
Platelets: 132 10*3/uL — ABNORMAL LOW (ref 150–400)
RBC: 2.01 MIL/uL — ABNORMAL LOW (ref 4.22–5.81)
RDW: 19.2 % — ABNORMAL HIGH (ref 11.5–15.5)
WBC: 16.8 10*3/uL — ABNORMAL HIGH (ref 4.0–10.5)
nRBC: 1.7 % — ABNORMAL HIGH (ref 0.0–0.2)

## 2023-11-23 MED ORDER — OXYCODONE HCL 5 MG PO TABS
5.0000 mg | ORAL_TABLET | ORAL | Status: DC | PRN
  Administered 2023-11-23 – 2023-11-25 (×4): 5 mg via ORAL
  Filled 2023-11-23 (×6): qty 1

## 2023-11-23 MED ORDER — BISACODYL 10 MG RE SUPP
10.0000 mg | Freq: Once | RECTAL | Status: AC
  Administered 2023-11-23: 10 mg via RECTAL
  Filled 2023-11-23: qty 1

## 2023-11-23 MED ORDER — SMOG ENEMA
300.0000 mL | Freq: Once | RECTAL | Status: DC
  Filled 2023-11-23: qty 960

## 2023-11-23 MED ORDER — MAGNESIUM CITRATE PO SOLN
1.0000 | Freq: Once | ORAL | Status: AC | PRN
Start: 2023-11-23 — End: 2023-11-24
  Filled 2023-11-23: qty 296

## 2023-11-23 NOTE — Progress Notes (Addendum)
 1 Day Post-Op   Subjective/Chief Complaint: No acute events overnight.  Pain is well-controlled   Objective: Vital signs in last 24 hours: Temp:  [97.3 F (36.3 C)-98.8 F (37.1 C)] 97.3 F (36.3 C) (06/18 0417) Pulse Rate:  [75-98] 80 (06/18 0417) Resp:  [11-26] 11 (06/18 0725) BP: (110-148)/(76-100) 110/86 (06/18 0417) SpO2:  [93 %-100 %] 99 % (06/18 0417) Last BM Date : 11/16/23  Intake/Output from previous day: 06/17 0701 - 06/18 0700 In: 2195.3 [P.O.:120; I.V.:1975.3; IV Piggyback:100] Out: 600 [Urine:600] Intake/Output this shift: No intake/output data recorded.  Sleeping very comfortably and difficult to awaken this morning Unlabored respirations Abdomen is soft, nondistended, nontender.  Incisions are clean, dry and intact with Dermabond; no cellulitis or hematoma.  Lab Results:  Recent Labs    11/22/23 0610 11/23/23 0535  WBC 13.3* 16.8*  HGB 7.7* 7.7*  HCT 21.7* 21.5*  PLT 162 132*   BMET Recent Labs    11/22/23 0610  NA 137  K 4.4  CL 103  CO2 25  GLUCOSE 89  BUN 11  CREATININE 0.66  CALCIUM 9.0   PT/INR No results for input(s): LABPROT, INR in the last 72 hours. ABG No results for input(s): PHART, HCO3 in the last 72 hours.  Invalid input(s): PCO2, PO2  Studies/Results: No results found.  Anti-infectives: Anti-infectives (From admission, onward)    Start     Dose/Rate Route Frequency Ordered Stop   11/22/23 0800  ceFAZolin (ANCEF) IVPB 2g/100 mL premix        2 g 200 mL/hr over 30 Minutes Intravenous  Once 11/21/23 1640 11/22/23 1113       Assessment/Plan:  Biliary colic  - CT on 6/1 and 6/11 show cholelithiasis without concern for cholecystitis - RUQ US  6/15 with the same  - Tbili mildly chronically elevated  - WBC and LFTs otherwise normal - Status post uneventful laparoscopic cholecystectomy on 6/17  FEN: As tolerated VTE: SCDs ID: ancef periop     I reviewed nursing notes, ED provider notes, hospitalist  notes, last 24 h vitals and pain scores, last 48 h intake and output, last 24 h labs and trends, and last 24 h imaging results.  Okay for discharge from surgical standpoint.  Surgery team will sign off.  Please call if there are any questions or concerns.  We will arrange follow-up, will defer pain medication prescriptions to primary team given concomitant sickle cell crisis   LOS: 3 days    Manuel Mcguire 11/23/2023

## 2023-11-23 NOTE — Anesthesia Postprocedure Evaluation (Signed)
 Anesthesia Post Note  Patient: Manuel Mcguire  Procedure(s) Performed: LAPAROSCOPIC CHOLECYSTECTOMY WITH INTRAOPERATIVE CHOLANGIOGRAM     Patient location during evaluation: PACU Anesthesia Type: General Level of consciousness: awake and alert Pain management: pain level controlled Vital Signs Assessment: post-procedure vital signs reviewed and stable Respiratory status: spontaneous breathing, nonlabored ventilation, respiratory function stable and patient connected to nasal cannula oxygen Cardiovascular status: blood pressure returned to baseline and stable Postop Assessment: no apparent nausea or vomiting Anesthetic complications: no   No notable events documented.  Last Vitals:  Vitals:   11/23/23 0417 11/23/23 0725  BP: 110/86   Pulse: 80   Resp: 16 11  Temp: (!) 36.3 C   SpO2: 99%     Last Pain:  Vitals:   11/23/23 0725  TempSrc:   PainSc: Asleep                 Melvenia Stabs

## 2023-11-23 NOTE — Progress Notes (Cosign Needed)
 Patient ID: Manuel Mcguire, male   DOB: 1990/09/16, 33 y.o.   MRN: [id number] Subjective: Manuel Mcguire is a 33 year old male with a medical history significant for sickle cell disease was admitted for sickle cell pain crisis also having abdominal pain with intractable nausea with vomiting.   Patient reports pain of 6/10 today S/P  laparoscopic cholecystectomy Nausea gradually improving. He denies vomiting, fever, headache, SOB, cough. No urinary symptoms.    Objective:  Vital signs in last 24 hours:  Vitals:   11/24/23 0150 11/24/23 0417 11/24/23 0647 11/24/23 0715  BP:   128/83   Pulse:   67   Resp: 20 18 14 14   Temp:   98.5 F (36.9 C)   TempSrc:   Oral   SpO2: 96% 95% 96%   Weight:      Height:        Intake/Output from previous day:   Intake/Output Summary (Last 24 hours) at 11/24/2023 0858 Last data filed at 11/24/2023 0807 Gross per 24 hour  Intake 260 ml  Output 1250 ml  Net -990 ml    Physical Exam: General: Alert, awake, oriented x3, in no acute distress.  HEENT: [place]/AT PEERL, EOMI Neck: Trachea midline,  no masses, no thyromegal,y no JVD, no carotid bruit OROPHARYNX:  Moist, No exudate/ erythema/lesions.  Heart: Regular rate and rhythm, without murmurs, rubs, gallops, PMI non-displaced, no heaves or thrills on palpation.  Lungs: Clear to auscultation, no wheezing or rhonchi noted. No increased vocal fremitus resonant to percussion  Abdomen: Tenderness. 4 laparoscopy sight  Neuro: No focal neurological deficits noted cranial nerves II through XII grossly intact. DTRs 2+ bilaterally upper and lower extremities. Strength 5 out of 5 in bilateral upper and lower extremities. Musculoskeletal: generalize body tenderness. Psychiatric: Patient alert and oriented x3, good insight and cognition, good recent to remote recall. Lymph node survey: No cervical axillary or inguinal lymphadenopathy noted.  Lab Results:  Basic Metabolic Panel:    Component Value Date/Time    NA 137 11/22/2023 0610   K 4.4 11/22/2023 0610   CL 103 11/22/2023 0610   CO2 25 11/22/2023 0610   BUN 11 11/22/2023 0610   CREATININE 0.66 11/22/2023 0610   GLUCOSE 89 11/22/2023 0610   CALCIUM 9.0 11/22/2023 0610   CBC:    Component Value Date/Time   WBC 15.9 (H) 11/24/2023 0543   HGB 7.5 (L) 11/24/2023 0543   HCT 21.6 (L) 11/24/2023 0543   PLT 197 11/24/2023 0543   MCV 105.9 (H) 11/24/2023 0543   NEUTROABS 7.3 11/19/2023 2121   LYMPHSABS 2.5 11/19/2023 2121   MONOABS 1.3 (H) 11/19/2023 2121   EOSABS 0.1 11/19/2023 2121   BASOSABS 0.1 11/19/2023 2121    Recent Results (from the past 240 hours)  Surgical PCR screen     Status: Abnormal   Collection Time: 11/22/23  7:36 AM   Specimen: Nasal Mucosa; Nasal Swab  Result Value Ref Range Status   MRSA, PCR NEGATIVE NEGATIVE Final   Staphylococcus aureus POSITIVE (A) NEGATIVE Final    Comment: (NOTE) The Xpert SA Assay (FDA approved for NASAL specimens in patients 2 years of age and older), is one component of a comprehensive surveillance program. It is not intended to diagnose infection nor to guide or monitor treatment. Performed at [facility], [address], [place], [place] [address]     Studies/Results: No results found.   Medications: Scheduled Meds:  cholecalciferol   1,000 Units Oral Daily   enoxaparin  (LOVENOX )  injection  40 mg Subcutaneous Q24H   folic acid   1 mg Oral Daily   HYDROmorphone    Intravenous Q4H   hydroxyurea   1,500 mg Oral Daily   mupirocin ointment  1 Application Nasal BID   SMOG  300 mL Rectal Once   Continuous Infusions:  electrolyte-A Stopped (11/22/23 1205)   PRN Meds:.acetaminophen , diphenhydrAMINE , melatonin, naloxone  **AND** sodium chloride  flush, ondansetron , oxyCODONE , polyethylene glycol  Consultants: Surgical Consult (completed)  Procedures: 1 day S/P laparoscopic cholecystectomy  Antibiotics: None  Assessment/Plan: Principal Problem:   Sickle  cell pain crisis (HCC) Active Problems:   Nausea vomiting and diarrhea   Anemia of chronic disease   Constipation   Intractable Nausea & Vomiting: Vomiting revolved, mild Nausea, continue Zofran .   Hb Sickle Cell Disease with Pain crisis: Continue IVF 0.45% Saline @125  mls/hour, continue weight based Dilaudid  PCA, IV Toradol  15 mg Q 6 H for a total of 5 days, continue oral home pain medications as ordered. Monitor vitals very closely, Re-evaluate pain scale regularly, 2 L of Oxygen by [place]. Patient encouraged to ambulate on the hallway today.  Constipation: increase hydration, enema Anemia of Chronic Disease: slightly lower than patients baseline. No clinical indication for  transfusion. Will continue to monitor daily CBC Chronic pain Syndrome: Continue oral pain medication   Code Status: Full Code Family Communication: N/A Disposition Plan: Not yet ready for discharge  Lorel Roes NP   If 7PM-7AM, please contact night-coverage.  11/24/2023, 8:58 AM  LOS: 4 days

## 2023-11-23 NOTE — Plan of Care (Signed)

## 2023-11-24 LAB — CBC
HCT: 21.6 % — ABNORMAL LOW (ref 39.0–52.0)
Hemoglobin: 7.5 g/dL — ABNORMAL LOW (ref 13.0–17.0)
MCH: 36.8 pg — ABNORMAL HIGH (ref 26.0–34.0)
MCHC: 34.7 g/dL (ref 30.0–36.0)
MCV: 105.9 fL — ABNORMAL HIGH (ref 80.0–100.0)
Platelets: 197 10*3/uL (ref 150–400)
RBC: 2.04 MIL/uL — ABNORMAL LOW (ref 4.22–5.81)
RDW: 19.5 % — ABNORMAL HIGH (ref 11.5–15.5)
WBC: 15.9 10*3/uL — ABNORMAL HIGH (ref 4.0–10.5)
nRBC: 1.5 % — ABNORMAL HIGH (ref 0.0–0.2)

## 2023-11-24 MED ORDER — LACTULOSE 10 GM/15ML PO SOLN
30.0000 g | Freq: Once | ORAL | Status: AC
Start: 2023-11-24 — End: 2023-11-24
  Filled 2023-11-24: qty 45

## 2023-11-24 NOTE — Progress Notes (Addendum)
 Patient ID: RASHI GIULIANI, male   DOB: Apr 05, 1991, 33 y.o.   MRN: [id number] Subjective: Arend Bahl is a 33 year old male with a medical history significant for sickle cell disease was admitted for sickle cell pain crisis also having abdominal pain with intractable nausea with vomiting.   Patient reports pain of 6/10 today S/P day 2  laparoscopic cholecystectomy Nausea improving. He denies vomiting, fever, headache, SOB, cough. No urinary symptoms.    Objective:  Vital signs in last 24 hours:  Vitals:   11/25/23 0130 11/25/23 0435 11/25/23 0557 11/25/23 0741  BP: 135/89  127/81   Pulse: 84  89   Resp: 14 17 16 13   Temp: 98.6 F (37 C)  99.1 F (37.3 C)   TempSrc: Oral  Oral   SpO2: 98%  95% 95%  Weight:      Height:        Intake/Output from previous day:  No intake or output data in the 24 hours ending 11/25/23 1621   Physical Exam: General: Alert, awake, oriented x3, in no acute distress.  HEENT: [place]/AT PEERL, EOMI Neck: Trachea midline,  no masses, no thyromegal,y no JVD, no carotid bruit OROPHARYNX:  Moist, No exudate/ erythema/lesions.  Heart: Regular rate and rhythm, without murmurs, rubs, gallops, PMI non-displaced, no heaves or thrills on palpation.  Lungs: Clear to auscultation, no wheezing or rhonchi noted. No increased vocal fremitus resonant to percussion  Abdomen: Tenderness, 4 laparoscopy sight  Neuro: No focal neurological deficits noted cranial nerves II through XII grossly intact. DTRs 2+ bilaterally upper and lower extremities. Strength 5 out of 5 in bilateral upper and lower extremities. Musculoskeletal: generalize body tenderness. Psychiatric: Patient alert and oriented x3, good insight and cognition, good recent to remote recall. Lymph node survey: No cervical axillary or inguinal lymphadenopathy noted.  Lab Results:  Basic Metabolic Panel:    Component Value Date/Time   NA 138 11/25/2023 0849   K 4.1 11/25/2023 0849   CL 103 11/25/2023 0849    CO2 27 11/25/2023 0849   BUN 10 11/25/2023 0849   CREATININE 0.81 11/25/2023 0849   GLUCOSE 92 11/25/2023 0849   CALCIUM 9.0 11/25/2023 0849   CBC:    Component Value Date/Time   WBC 12.7 (H) 11/25/2023 0849   HGB 7.3 (L) 11/25/2023 0849   HCT 20.4 (L) 11/25/2023 0849   PLT 232 11/25/2023 0849   MCV 104.6 (H) 11/25/2023 0849   NEUTROABS 7.3 11/19/2023 2121   LYMPHSABS 2.5 11/19/2023 2121   MONOABS 1.3 (H) 11/19/2023 2121   EOSABS 0.1 11/19/2023 2121   BASOSABS 0.1 11/19/2023 2121    Recent Results (from the past 240 hours)  Surgical PCR screen     Status: Abnormal   Collection Time: 11/22/23  7:36 AM   Specimen: Nasal Mucosa; Nasal Swab  Result Value Ref Range Status   MRSA, PCR NEGATIVE NEGATIVE Final   Staphylococcus aureus POSITIVE (A) NEGATIVE Final    Comment: (NOTE) The Xpert SA Assay (FDA approved for NASAL specimens in patients 58 years of age and older), is one component of a comprehensive surveillance program. It is not intended to diagnose infection nor to guide or monitor treatment. Performed at [facility], [address] [place], [place] [address]     Studies/Results: No results found.   Medications: Scheduled Meds:   Continuous Infusions:   PRN Meds:.  Consultants: Surgical Consult (completed)  Procedures:  day 2 S/P laparoscopic cholecystectomy  Antibiotics: None  Assessment/Plan: Principal  Problem:   Sickle cell pain crisis (HCC) Active Problems:   Nausea vomiting and diarrhea   Anemia of chronic disease   Constipation   Cholelithiasis   Cholelithiasis with chronic cholecystitis: S/P Laparoscopic Cholecystectomy with near-infrared fluorescent cholangiography Procedure completed on 11/22/2023. Doing well post op, patient tolerated procedure well without complication. He is okay to discharge per surgical team. Intractable Nausea & Vomiting: Vomiting revolved, mild Nausea improving, continue Zofran .   Hb  Sickle Cell Disease with Pain crisis: Continue IVF 0.45% Saline KVO, continue weight based Dilaudid  PCA, IV Toradol  15 mg Q 6 H for a total of 5 days, continue oral home pain medications as ordered. Monitor vitals very closely, Re-evaluate pain scale regularly, 2 L of Oxygen by [place]. Patient encouraged to ambulate on the hallway today.  Constipation: increase hydration, lactulose  30 mg p.o. Anemia of Chronic Disease: slightly lower than patients baseline. No clinical indication for  transfusion. Will continue to monitor daily CBC Chronic pain Syndrome: Continue oral pain medication   Code Status: Full Code Family Communication: N/A Disposition Plan: ready for discharge in the a.m.  [name] Cover NP   If 7PM-7AM, please contact night-coverage.  11/25/2023, 4:21 PM  LOS: 5 days

## 2023-11-24 NOTE — Progress Notes (Signed)
 This nurse and provider at the bedside. Offered enema to pt and educated pt on how it will be helpful. Pt stated he would prefer to drink something vs enema to help initiate a bowel movement. Pt stated he wanted to talk with his dad first and notify this nurse on his decision to receive enema.

## 2023-11-24 NOTE — Progress Notes (Signed)
 Notified pt time for enema. Pt wanted to defer until night shift.

## 2023-11-24 NOTE — Progress Notes (Signed)
 After administration of lactulose pt reports 4 large BM's.

## 2023-11-24 NOTE — Progress Notes (Signed)
 Pt informed of enema order and educated. Pt wanted to try something else. Pt given suppository for constipation and reports only 1 drop of stool from rectum.   This nurse offered to give enema pt refused until later

## 2023-11-24 NOTE — Plan of Care (Signed)
  Problem: Education: Goal: Knowledge of General Education information will improve Description: Including pain rating scale, medication(s)/side effects and non-pharmacologic comfort measures Outcome: Progressing   Problem: Health Behavior/Discharge Planning: Goal: Ability to manage health-related needs will improve Outcome: Progressing   Problem: Clinical Measurements: Goal: Ability to maintain clinical measurements within normal limits will improve Outcome: Progressing Goal: Will remain free from infection Outcome: Progressing Goal: Diagnostic test results will improve Outcome: Progressing Goal: Respiratory complications will improve Outcome: Progressing Goal: Cardiovascular complication will be avoided Outcome: Progressing   Problem: Activity: Goal: Risk for activity intolerance will decrease Outcome: Progressing   Problem: Nutrition: Goal: Adequate nutrition will be maintained Outcome: Progressing   Problem: Coping: Goal: Level of anxiety will decrease Outcome: Progressing   Problem: Elimination: Goal: Will not experience complications related to bowel motility Outcome: Progressing Goal: Will not experience complications related to urinary retention Outcome: Progressing   Problem: Pain Managment: Goal: General experience of comfort will improve and/or be controlled Outcome: Progressing   Problem: Safety: Goal: Ability to remain free from injury will improve Outcome: Progressing   Problem: Skin Integrity: Goal: Risk for impaired skin integrity will decrease Outcome: Progressing   Problem: Education: Goal: Knowledge of vaso-occlusive preventative measures will improve Outcome: Progressing Goal: Awareness of infection prevention will improve Outcome: Progressing Goal: Awareness of signs and symptoms of anemia will improve Outcome: Progressing Goal: Long-term complications will improve Outcome: Progressing   Problem: Self-Care: Goal: Ability to  incorporate actions that prevent/reduce pain crisis will improve Outcome: Progressing   Problem: Bowel/Gastric: Goal: Gut motility will be maintained Outcome: Progressing   Problem: Tissue Perfusion: Goal: Complications related to inadequate tissue perfusion will be avoided or minimized Outcome: Progressing   Problem: Respiratory: Goal: Pulmonary complications will be avoided or minimized Outcome: Progressing Goal: Acute Chest Syndrome will be identified early to prevent complications Outcome: Progressing   Problem: Fluid Volume: Goal: Ability to maintain a balanced intake and output will improve Outcome: Progressing   Problem: Health Behavior: Goal: Postive changes in compliance with treatment and prescription regimens will improve Outcome: Progressing

## 2023-11-24 NOTE — Plan of Care (Signed)

## 2023-11-25 DIAGNOSIS — K8020 Calculus of gallbladder without cholecystitis without obstruction: Secondary | ICD-10-CM | POA: Insufficient documentation

## 2023-11-25 LAB — COMPREHENSIVE METABOLIC PANEL WITH GFR
ALT: 67 U/L — ABNORMAL HIGH (ref 0–44)
AST: 69 U/L — ABNORMAL HIGH (ref 15–41)
Albumin: 3.7 g/dL (ref 3.5–5.0)
Alkaline Phosphatase: 89 U/L (ref 38–126)
Anion gap: 8 (ref 5–15)
BUN: 10 mg/dL (ref 6–20)
CO2: 27 mmol/L (ref 22–32)
Calcium: 9 mg/dL (ref 8.9–10.3)
Chloride: 103 mmol/L (ref 98–111)
Creatinine, Ser: 0.81 mg/dL (ref 0.61–1.24)
GFR, Estimated: >60 mL/min (ref >60)
Glucose, Bld: 92 mg/dL (ref 70–99)
Potassium: 4.1 mmol/L (ref 3.5–5.1)
Sodium: 138 mmol/L (ref 135–145)
Total Bilirubin: 1.8 mg/dL — ABNORMAL HIGH (ref 0.0–1.2)
Total Protein: 7.7 g/dL (ref 6.5–8.1)

## 2023-11-25 LAB — CBC
HCT: 20.4 % — ABNORMAL LOW (ref 39.0–52.0)
Hemoglobin: 7.3 g/dL — ABNORMAL LOW (ref 13.0–17.0)
MCH: 37.4 pg — ABNORMAL HIGH (ref 26.0–34.0)
MCHC: 35.8 g/dL (ref 30.0–36.0)
MCV: 104.6 fL — ABNORMAL HIGH (ref 80.0–100.0)
Platelets: 232 10*3/uL (ref 150–400)
RBC: 1.95 MIL/uL — ABNORMAL LOW (ref 4.22–5.81)
RDW: 18.9 % — ABNORMAL HIGH (ref 11.5–15.5)
WBC: 12.7 10*3/uL — ABNORMAL HIGH (ref 4.0–10.5)
nRBC: 1.5 % — ABNORMAL HIGH (ref 0.0–0.2)

## 2023-11-25 NOTE — Plan of Care (Signed)

## 2023-11-25 NOTE — Progress Notes (Signed)
 Discharge instructions given to patient questions asked and answered. Sylvia Everts RN

## 2023-11-25 NOTE — Discharge Summary (Addendum)
 Physician Discharge Summary  Manuel Mcguire:[id number] DOB: 1991/06/02 DOA: 11/19/2023  PCP: Pcp, No  Admit date: 11/19/2023  Discharge date: 11/25/2023  Discharge Diagnoses:  Principal Problem:   Sickle cell pain crisis (HCC) Active Problems:   Nausea vomiting and diarrhea   Anemia of chronic disease   Constipation   Cholelithiasis   Discharge Condition: Stable  Disposition:   Follow-up Information     Maczis, Puja Gosai, PA-C. Go on 12/22/2023.   Specialty: General Surgery Why: 3:30 PM, please arrive 30 min prior to appointment time to check in Contact information: [address] [facility] [place] [address] [phone]                Pt is discharged home in good condition and is to follow up with Pcp, No this week to have labs evaluated. Manuel Mcguire is instructed to increase activity slowly and balance with rest for the next few days, and use prescribed medication to complete treatment of pain  Diet: Regular Wt Readings from Last 3 Encounters:  11/20/23 69.8 kg  11/16/23 72.5 kg  11/06/23 72.6 kg    History of present illness:  Manuel Mcguire is a 33 y.o. male with medical history significant for sickle cell disease, cholelithiasis seen on CT scan (11/16/2023), recently admitted for sickle cell pain crisis and discharged however he returned 48 hours later to the ER due to upper abdominal pain, intractable nausea and vomiting for the past 1 to 2 days.  Unable to keep anything down.  States every time he eats he becomes nauseous and vomits. He denied fevers or chills, headache, shortness of breath. No urinary symptoms, recent travels or sick contacts. Also endorses his typical sickle cell pain involving his lower back.   ED Course: In the ER, afebrile.  Tenderness on palpation involving upper abdomen.  Lipase was within normal range, 31.  AST and total bilirubin were mildly elevated.  The patient received multiple  rounds of opiate based IV analgesics with no resolution to symptoms. Patient then admitted for ongoing management of abdominal and sickle cell crisis.  BP 119/87  Pulse 96  Temp 98.1 F (36.7 C)  Resp 16  SpO2 97%   Hospital Course:  Patient was admitted for sickle cell pain crisis and managed appropriately with IVF, IV Dilaudid  via PCA and IV Toradol , as well as other adjunct therapies per sickle cell pain management protocols.  Ultrasound abdomen right upper quadrant showed:Cholelithiasis without sonographic evidence of acute cholecystitis. After Surgical consult, patient completed Laparoscopic Cholecystectomy with near-infrared fluorescent cholangiography procedure on 11/22/2023. Doing well post op, patient tolerated procedure well without complications.  Patient is tolerating p.o. without nausea or vomiting, reporting significant improvement in pain. Ambulating without assistance.  No signs or symptoms of infection at laparoscopic site.  Patient had a good bowel movement after persistent constipation. He is okay to discharge per surgical team.  Patient was therefore discharged home today in a hemodynamically stable condition.   [place] will follow-up with PCP within 1 week of this discharge. Manuel Mcguire was counseled extensively about nonpharmacologic means of pain management, patient verbalized understanding and was appreciative of  the care received during this admission.   We discussed the need for good hydration, monitoring of hydration status, avoidance of heat, cold, stress, and infection triggers. We discussed the need to be adherent with  other home medications. Patient was reminded of the need to seek medical attention immediately if any symptom of bleeding,  anemia, or infection occurs.  Discharge Exam: Vitals:   11/25/23 0557 11/25/23 0741  BP: 127/81   Pulse: 89   Resp: 16 13  Temp: 99.1 F (37.3 C)   SpO2: 95% 95%   Vitals:   11/25/23 0130 11/25/23 0435 11/25/23 0557 11/25/23  0741  BP: 135/89  127/81   Pulse: 84  89   Resp: 14 17 16 13   Temp: 98.6 F (37 C)  99.1 F (37.3 C)   TempSrc: Oral  Oral   SpO2: 98%  95% 95%  Weight:      Height:        General appearance : Awake, alert, not in any distress. Speech Clear. Not toxic looking HEENT: Atraumatic and Normocephalic, pupils equally reactive to light and accomodation Neck: Supple, no JVD. No cervical lymphadenopathy.  Chest: Good air entry bilaterally, no added sounds  CVS: S1 S2 regular, no murmurs.  Abdomen: Bowel sounds present, Non tender and not distended with no gaurding, rigidity or rebound. Extremities: B/L Lower Ext shows no edema, both legs are warm to touch Neurology: Awake alert, and oriented X 3, CN II-XII intact, Non focal Skin: No Rash  Discharge Instructions  Discharge Instructions     Call MD for:  difficulty breathing, headache or visual disturbances   Complete by: As directed    Call MD for:  severe uncontrolled pain   Complete by: As directed    Call MD for:  temperature >100.4   Complete by: As directed    Diet - low sodium heart healthy   Complete by: As directed    Increase activity slowly   Complete by: As directed       Allergies as of 11/25/2023   No Known Allergies      Medication List     TAKE these medications    cholecalciferol  25 MCG (1000 UNIT) tablet Commonly known as: VITAMIN D3 Take 1,000 Units by mouth daily.   folic acid  1 MG tablet Commonly known as: FOLVITE  Take 1 tablet (1 mg total) by mouth daily.   hydroxyurea  500 MG capsule Commonly known as: HYDREA  Take 3 capsules (1,500 mg total) by mouth daily. May take with food to minimize GI side effects.   lisinopril  2.5 MG tablet Commonly known as: ZESTRIL  Take 2.5 mg by mouth daily.   ondansetron  4 MG disintegrating tablet Commonly known as: ZOFRAN -ODT 4mg  ODT q4 hours prn nausea/vomit   oxyCODONE  5 MG immediate release tablet Commonly known as: Oxy IR/ROXICODONE  Take 5 mg by mouth  every 6 (six) hours as needed for severe pain.        The results of significant diagnostics from this hospitalization (including imaging, microbiology, ancillary and laboratory) are listed below for reference.    Significant Diagnostic Studies: US  Abdomen Limited RUQ (LIVER/GB) Result Date: 11/20/2023 CLINICAL DATA:  [id number] Intractable abdominal pain [id number] Nausea AND vomiting [id number] EXAM: ULTRASOUND ABDOMEN LIMITED RIGHT UPPER QUADRANT COMPARISON:  November 16, 2023 FINDINGS: Gallbladder: Multiple layering gallstones. No wall thickening visualized. No sonographic Murphy sign noted by sonographer. Common bile duct: Diameter: Visualized portion measures 7 mm, upper limits of normal. This is similar compared to prior CT. The majority is not visualized. Liver: No focal lesion identified. Within normal limits in parenchymal echogenicity. Portal vein is patent on color Doppler imaging with normal direction of blood flow towards the liver. Other: None. IMPRESSION: Cholelithiasis without sonographic evidence of acute cholecystitis. If there is a persistent clinical concern for biliary obstruction, this would be better  assessed with dedicated MRI with MRCP. Electronically Signed   By: Clancy Crimes M.D.   On: 11/20/2023 07:46   CT ABDOMEN PELVIS W CONTRAST Result Date: 11/16/2023 CLINICAL DATA:  Abdominal pain EXAM: CT ABDOMEN AND PELVIS WITHOUT CONTRAST TECHNIQUE: Multidetector CT imaging of the abdomen and pelvis was performed following the standard protocol without IV contrast. RADIATION DOSE REDUCTION: This exam was performed according to the departmental dose-optimization program which includes automated exposure control, adjustment of the mA and/or kV according to patient size and/or use of iterative reconstruction technique. COMPARISON:  CT abdomen and pelvis November 06, 2023 FINDINGS: Lower chest: No infiltrates or consolidations, no pleural effusions Hepatobiliary: No change gallstones previously  described on prior CT. No focal hepatic lesions or biliary dilatation. The upper abdomen lower chest significantly limited due to metallic artifact from the thoracolumbar instrumentation. Pancreas: Pancreas normal size. No masses calcifications or inflammatory changes. Spleen: Spleen normal size.  No masses. Adrenals/Urinary Tract: Adrenal glands are normal size. Follow-up recommended. Kidneys are normal. No masses calcifications or hydronephrosis Stomach/Bowel: No small or large bowel obstruction or inflammatory changes. Moderate amount of residual fecal material throughout the colon without obstruction or constipation. Vascular/Lymphatic: No significant vascular findings are present. No enlarged abdominal or pelvic lymph nodes. Reproductive: Prostate is unremarkable. Other: Anterior abdominal wall unremarkable without evidence of umbilical or inguinal hernias Musculoskeletal: Visualized portion of the thoracolumbar spine and pelvic structures grossly unremarkable without evidence of fracture bony abnormalities or soft tissue masses. IMPRESSION: *No acute findings in the abdomen or pelvis. *Cholelithiasis. *Moderate amount of residual fecal material throughout the colon without obstruction or constipation. Electronically Signed   By: Fredrich Jefferson M.D.   On: 11/16/2023 11:52   CT ABDOMEN PELVIS W CONTRAST Result Date: 11/06/2023 EXAM: CT ABDOMEN AND PELVIS WITH CONTRAST 11/06/2023 07:30:00 PM TECHNIQUE: CT of the abdomen and pelvis was performed with the administration of intravenous contrast. Multiplanar reformatted images are provided for review. Automated exposure control, iterative reconstruction, and/or weight based adjustment of the mA/kV was utilized to reduce the radiation dose to as low as reasonably achievable. COMPARISON: 03/12/2012 CLINICAL HISTORY: Abdominal pain, acute, nonlocalized. Pt reports NV x 2 day, minor abd discomfort; reports sickle cell pain to lower back. FINDINGS: LOWER CHEST: Mild  left basilar opacity, likely atelectasis. LIVER: The liver is unremarkable. GALLBLADDER AND BILE DUCTS: Layering gallstones, without associated inflammatory changes. SPLEEN: No acute abnormality. PANCREAS: No acute abnormality. ADRENAL GLANDS: No acute abnormality. KIDNEYS, URETERS AND BLADDER: No stones in the kidneys or ureters. No hydronephrosis. No perinephric or periureteral stranding. Urinary bladder is unremarkable. GI AND BOWEL: Normal appendix (image 55). No acute abnormality. PERITONEUM AND RETROPERITONEUM: No ascites. No free air. VASCULATURE: No aortic aneurysm. LYMPH NODES: No lymphadenopathy. REPRODUCTIVE ORGANS: No acute abnormality. BONES AND SOFT TISSUES: Thoracic spine fixation hardware with associated streak artifact. Status post ORIF of the left hip. No acute osseous abnormality. No focal soft tissue abnormality. Mild osseous sclerosis without typical endplate changes of sickle cell. IMPRESSION: 1. Layering gallstones, without associated inflammatory changes. 2. No acute abnormality. Electronically signed by: Zadie Herter MD 11/06/2023 07:42 PM EDT RP Workstation: [id number]    Microbiology: Recent Results (from the past 240 hours)  Surgical PCR screen     Status: Abnormal   Collection Time: 11/22/23  7:36 AM   Specimen: Nasal Mucosa; Nasal Swab  Result Value Ref Range Status   MRSA, PCR NEGATIVE NEGATIVE Final   Staphylococcus aureus POSITIVE (A) NEGATIVE Final    Comment: (NOTE) The Xpert  SA Assay (FDA approved for NASAL specimens in patients 110 years of age and older), is one component of a comprehensive surveillance program. It is not intended to diagnose infection nor to guide or monitor treatment. Performed at [facility], [address] [place], [place] [address]      Labs: Basic Metabolic Panel: Recent Labs  Lab 11/19/23 2121 11/20/23 0645 11/22/23 0610 11/25/23 0849  NA 140 136 137 138  K 4.0 3.7 4.4 4.1  CL 106 104 103 103  CO2 23  25 25 27   GLUCOSE 113* 102* 89 92  BUN 13 12 11 10   CREATININE 0.95 0.94 0.66 0.81  CALCIUM 9.4 9.2 9.0 9.0  MG  --  2.1  --   --   PHOS  --  5.0*  --   --    Liver Function Tests: Recent Labs  Lab 11/19/23 2121 11/20/23 0645 11/22/23 0610 11/25/23 0849  AST 44* 37 44* 69*  ALT 26 22 23  67*  ALKPHOS 89 77 79 89  BILITOT 2.0* 2.3* 1.6* 1.8*  PROT 9.0* 8.6* 7.8 7.7  ALBUMIN 4.6 4.0 3.8 3.7   Recent Labs  Lab 11/19/23 2121  LIPASE 31   No results for input(s): AMMONIA in the last 168 hours. CBC: Recent Labs  Lab 11/19/23 2121 11/20/23 0645 11/22/23 0610 11/23/23 0535 11/24/23 0543 11/25/23 0849  WBC 11.3* 15.1* 13.3* 16.8* 15.9* 12.7*  NEUTROABS 7.3  --   --   --   --   --   HGB 8.5* 7.9* 7.7* 7.7* 7.5* 7.3*  HCT 22.8* 22.9* 21.7* 21.5* 21.6* 20.4*  MCV 103.6* 107.5* 106.9* 107.0* 105.9* 104.6*  PLT 201 186 162 132* 197 232   Cardiac Enzymes: No results for input(s): CKTOTAL, CKMB, CKMBINDEX, TROPONINI in the last 168 hours. BNP: Invalid input(s): POCBNP CBG: No results for input(s): GLUCAP in the last 168 hours.  Time coordinating discharge: 50 minutes  Signed:  Lorel Roes NP  [facility] 11/25/2023, 4:29 PM

## 2023-11-26 ENCOUNTER — Encounter (HOSPITAL_COMMUNITY): Payer: Self-pay

## 2023-11-26 ENCOUNTER — Emergency Department (HOSPITAL_COMMUNITY)

## 2023-11-26 ENCOUNTER — Other Ambulatory Visit: Payer: Self-pay

## 2023-11-26 ENCOUNTER — Inpatient Hospital Stay (HOSPITAL_COMMUNITY)
Admission: EM | Admit: 2023-11-26 | Discharge: 2023-12-03 | DRG: 811 | Disposition: A | Discharge status: Home / Self Care | Attending: Internal Medicine | Admitting: Internal Medicine

## 2023-11-26 DIAGNOSIS — Z832 Family history of diseases of the blood and blood-forming organs and certain disorders involving the immune mechanism: Secondary | ICD-10-CM | POA: Diagnosis not present

## 2023-11-26 DIAGNOSIS — Z5986 Financial insecurity: Secondary | ICD-10-CM | POA: Diagnosis not present

## 2023-11-26 DIAGNOSIS — D57219 Sickle-cell/Hb-C disease with crisis, unspecified: Secondary | ICD-10-CM | POA: Diagnosis not present

## 2023-11-26 DIAGNOSIS — D638 Anemia in other chronic diseases classified elsewhere: Secondary | ICD-10-CM | POA: Diagnosis not present

## 2023-11-26 DIAGNOSIS — Z59819 Housing instability, housed unspecified: Secondary | ICD-10-CM | POA: Diagnosis not present

## 2023-11-26 DIAGNOSIS — Z8249 Family history of ischemic heart disease and other diseases of the circulatory system: Secondary | ICD-10-CM

## 2023-11-26 DIAGNOSIS — R112 Nausea with vomiting, unspecified: Secondary | ICD-10-CM | POA: Diagnosis not present

## 2023-11-26 DIAGNOSIS — Z79899 Other long term (current) drug therapy: Secondary | ICD-10-CM

## 2023-11-26 DIAGNOSIS — R1084 Generalized abdominal pain: Secondary | ICD-10-CM | POA: Diagnosis not present

## 2023-11-26 DIAGNOSIS — R1031 Right lower quadrant pain: Secondary | ICD-10-CM | POA: Diagnosis not present

## 2023-11-26 DIAGNOSIS — K5900 Constipation, unspecified: Secondary | ICD-10-CM | POA: Diagnosis not present

## 2023-11-26 DIAGNOSIS — Z9049 Acquired absence of other specified parts of digestive tract: Secondary | ICD-10-CM

## 2023-11-26 DIAGNOSIS — R109 Unspecified abdominal pain: Secondary | ICD-10-CM | POA: Diagnosis present

## 2023-11-26 DIAGNOSIS — R17 Unspecified jaundice: Secondary | ICD-10-CM | POA: Diagnosis not present

## 2023-11-26 DIAGNOSIS — Z833 Family history of diabetes mellitus: Secondary | ICD-10-CM | POA: Diagnosis not present

## 2023-11-26 DIAGNOSIS — R7989 Other specified abnormal findings of blood chemistry: Secondary | ICD-10-CM | POA: Diagnosis not present

## 2023-11-26 DIAGNOSIS — R7401 Elevation of levels of liver transaminase levels: Secondary | ICD-10-CM | POA: Diagnosis not present

## 2023-11-26 DIAGNOSIS — J690 Pneumonitis due to inhalation of food and vomit: Secondary | ICD-10-CM | POA: Diagnosis not present

## 2023-11-26 DIAGNOSIS — G894 Chronic pain syndrome: Secondary | ICD-10-CM | POA: Diagnosis not present

## 2023-11-26 DIAGNOSIS — R1010 Upper abdominal pain, unspecified: Secondary | ICD-10-CM | POA: Diagnosis not present

## 2023-11-26 DIAGNOSIS — J168 Pneumonia due to other specified infectious organisms: Secondary | ICD-10-CM | POA: Diagnosis not present

## 2023-11-26 DIAGNOSIS — D5700 Hb-SS disease with crisis, unspecified: Principal | ICD-10-CM | POA: Diagnosis present

## 2023-11-26 DIAGNOSIS — K8064 Calculus of gallbladder and bile duct with chronic cholecystitis without obstruction: Secondary | ICD-10-CM | POA: Diagnosis present

## 2023-11-26 DIAGNOSIS — M419 Scoliosis, unspecified: Secondary | ICD-10-CM | POA: Diagnosis present

## 2023-11-26 DIAGNOSIS — D72829 Elevated white blood cell count, unspecified: Secondary | ICD-10-CM | POA: Diagnosis present

## 2023-11-26 DIAGNOSIS — J189 Pneumonia, unspecified organism: Principal | ICD-10-CM

## 2023-11-26 DIAGNOSIS — R932 Abnormal findings on diagnostic imaging of liver and biliary tract: Secondary | ICD-10-CM | POA: Diagnosis not present

## 2023-11-26 LAB — COMPREHENSIVE METABOLIC PANEL WITH GFR
ALT: 116 U/L — ABNORMAL HIGH (ref 0–44)
AST: 90 U/L — ABNORMAL HIGH (ref 15–41)
Albumin: 4.4 g/dL (ref 3.5–5.0)
Alkaline Phosphatase: 118 U/L (ref 38–126)
Anion gap: 10 (ref 5–15)
BUN: 11 mg/dL (ref 6–20)
CO2: 22 mmol/L (ref 22–32)
Calcium: 9.9 mg/dL (ref 8.9–10.3)
Chloride: 106 mmol/L (ref 98–111)
Creatinine, Ser: 0.83 mg/dL (ref 0.61–1.24)
GFR, Estimated: >60 mL/min (ref >60)
Glucose, Bld: 119 mg/dL — ABNORMAL HIGH (ref 70–99)
Potassium: 3.7 mmol/L (ref 3.5–5.1)
Sodium: 138 mmol/L (ref 135–145)
Total Bilirubin: 2.7 mg/dL — ABNORMAL HIGH (ref 0.0–1.2)
Total Protein: 9.2 g/dL — ABNORMAL HIGH (ref 6.5–8.1)

## 2023-11-26 LAB — CBC WITH DIFFERENTIAL/PLATELET
Abs Immature Granulocytes: 0.11 10*3/uL — ABNORMAL HIGH (ref 0.00–0.07)
Basophils Absolute: 0 10*3/uL (ref 0.0–0.1)
Basophils Relative: 0 %
Eosinophils Absolute: 0.1 10*3/uL (ref 0.0–0.5)
Eosinophils Relative: 0 %
HCT: 24 % — ABNORMAL LOW (ref 39.0–52.0)
Hemoglobin: 8.5 g/dL — ABNORMAL LOW (ref 13.0–17.0)
Immature Granulocytes: 1 %
Lymphocytes Relative: 9 %
Lymphs Abs: 1.8 10*3/uL (ref 0.7–4.0)
MCH: 38.1 pg — ABNORMAL HIGH (ref 26.0–34.0)
MCHC: 35.4 g/dL (ref 30.0–36.0)
MCV: 107.6 fL — ABNORMAL HIGH (ref 80.0–100.0)
Monocytes Absolute: 2.1 10*3/uL — ABNORMAL HIGH (ref 0.1–1.0)
Monocytes Relative: 11 %
Neutro Abs: 15.8 10*3/uL — ABNORMAL HIGH (ref 1.7–7.7)
Neutrophils Relative %: 79 %
Platelets: 354 10*3/uL (ref 150–400)
RBC: 2.23 MIL/uL — ABNORMAL LOW (ref 4.22–5.81)
RDW: 19.8 % — ABNORMAL HIGH (ref 11.5–15.5)
WBC: 19.9 10*3/uL — ABNORMAL HIGH (ref 4.0–10.5)
nRBC: 0.7 % — ABNORMAL HIGH (ref 0.0–0.2)

## 2023-11-26 LAB — URINALYSIS, ROUTINE W REFLEX MICROSCOPIC
Bilirubin Urine: NEGATIVE
Glucose, UA: NEGATIVE mg/dL
Ketones, ur: NEGATIVE mg/dL
Leukocytes,Ua: NEGATIVE
Nitrite: NEGATIVE
Protein, ur: 100 mg/dL — AB
Specific Gravity, Urine: 1.014 (ref 1.005–1.030)
pH: 5 (ref 5.0–8.0)

## 2023-11-26 LAB — LIPASE, BLOOD: Lipase: 33 U/L (ref 11–51)

## 2023-11-26 MED ORDER — FOLIC ACID 1 MG PO TABS
1.0000 mg | ORAL_TABLET | Freq: Every day | ORAL | Status: DC
  Administered 2023-11-26 – 2023-12-03 (×8): 1 mg via ORAL
  Filled 2023-11-26 (×8): qty 1

## 2023-11-26 MED ORDER — IOHEXOL 300 MG/ML  SOLN
100.0000 mL | Freq: Once | INTRAMUSCULAR | Status: AC | PRN
  Administered 2023-11-26: 100 mL via INTRAVENOUS

## 2023-11-26 MED ORDER — DIPHENHYDRAMINE HCL 25 MG PO CAPS
25.0000 mg | ORAL_CAPSULE | ORAL | Status: DC | PRN
  Administered 2023-11-30 – 2023-12-03 (×3): 25 mg via ORAL
  Filled 2023-11-26 (×3): qty 1

## 2023-11-26 MED ORDER — SODIUM CHLORIDE 0.9 % IV BOLUS
1000.0000 mL | Freq: Once | INTRAVENOUS | Status: AC
  Administered 2023-11-26: 1000 mL via INTRAVENOUS

## 2023-11-26 MED ORDER — HEPARIN SODIUM (PORCINE) 5000 UNIT/ML IJ SOLN
5000.0000 [IU] | Freq: Three times a day (TID) | INTRAMUSCULAR | Status: DC
  Administered 2023-11-26 – 2023-12-03 (×19): 5000 [IU] via SUBCUTANEOUS
  Filled 2023-11-26 (×19): qty 1

## 2023-11-26 MED ORDER — POLYETHYLENE GLYCOL 3350 17 G PO PACK
17.0000 g | PACK | Freq: Every day | ORAL | Status: DC | PRN
  Administered 2023-11-30 – 2023-12-03 (×2): 17 g via ORAL
  Filled 2023-11-26 (×2): qty 1

## 2023-11-26 MED ORDER — ACETAMINOPHEN 650 MG RE SUPP: 650.0000 mg | Freq: Four times a day (QID) | RECTAL | Status: DC | PRN

## 2023-11-26 MED ORDER — ALBUTEROL SULFATE (2.5 MG/3ML) 0.083% IN NEBU
2.5000 mg | INHALATION_SOLUTION | RESPIRATORY_TRACT | Status: DC | PRN
Start: 2023-11-26 — End: 2023-12-03

## 2023-11-26 MED ORDER — HYDROMORPHONE HCL 1 MG/ML IJ SOLN
1.0000 mg | Freq: Once | INTRAMUSCULAR | Status: AC
  Administered 2023-11-26: 1 mg via INTRAVENOUS
  Filled 2023-11-26: qty 1

## 2023-11-26 MED ORDER — OXYCODONE HCL 5 MG PO TABS
5.0000 mg | ORAL_TABLET | Freq: Four times a day (QID) | ORAL | Status: DC | PRN
  Administered 2023-11-26: 5 mg via ORAL
  Filled 2023-11-26: qty 1

## 2023-11-26 MED ORDER — ONDANSETRON HCL 4 MG/2ML IJ SOLN
4.0000 mg | Freq: Once | INTRAMUSCULAR | Status: AC
  Administered 2023-11-26: 4 mg via INTRAVENOUS
  Filled 2023-11-26: qty 2

## 2023-11-26 MED ORDER — HYDROMORPHONE HCL 1 MG/ML IJ SOLN: 1.0000 mg | Freq: Once | INTRAMUSCULAR | Status: DC

## 2023-11-26 MED ORDER — METRONIDAZOLE 500 MG/100ML IV SOLN
500.0000 mg | Freq: Two times a day (BID) | INTRAVENOUS | Status: AC
  Administered 2023-11-26 – 2023-11-27 (×2): 500 mg via INTRAVENOUS
  Filled 2023-11-26 (×2): qty 100

## 2023-11-26 MED ORDER — SODIUM CHLORIDE 0.9% FLUSH: 10.0000 mL | INTRAVENOUS | Status: DC | PRN

## 2023-11-26 MED ORDER — SODIUM CHLORIDE 0.9% FLUSH: 9.0000 mL | INTRAVENOUS | Status: DC | PRN

## 2023-11-26 MED ORDER — DEXTROSE-SODIUM CHLORIDE 5-0.45 % IV SOLN
INTRAVENOUS | Status: AC
  Administered 2023-11-27: 1000 mL via INTRAVENOUS

## 2023-11-26 MED ORDER — ADULT MULTIVITAMIN W/MINERALS CH
1.0000 | ORAL_TABLET | Freq: Every day | ORAL | Status: DC
  Administered 2023-11-26 – 2023-12-03 (×8): 1 via ORAL
  Filled 2023-11-26 (×8): qty 1

## 2023-11-26 MED ORDER — ACETAMINOPHEN 325 MG PO TABS: 650.0000 mg | ORAL_TABLET | Freq: Four times a day (QID) | ORAL | Status: DC | PRN

## 2023-11-26 MED ORDER — SENNOSIDES-DOCUSATE SODIUM 8.6-50 MG PO TABS
1.0000 | ORAL_TABLET | Freq: Two times a day (BID) | ORAL | Status: DC
  Administered 2023-11-26 – 2023-12-03 (×14): 1 via ORAL
  Filled 2023-11-26 (×14): qty 1

## 2023-11-26 MED ORDER — ONDANSETRON HCL 4 MG PO TABS
4.0000 mg | ORAL_TABLET | Freq: Four times a day (QID) | ORAL | Status: DC | PRN
  Administered 2023-11-30: 4 mg via ORAL
  Filled 2023-11-26: qty 1

## 2023-11-26 MED ORDER — HYDROMORPHONE HCL 1 MG/ML IJ SOLN
2.0000 mg | Freq: Once | INTRAMUSCULAR | Status: AC
  Administered 2023-11-26: 2 mg via INTRAVENOUS
  Filled 2023-11-26: qty 2

## 2023-11-26 MED ORDER — ONDANSETRON HCL 4 MG/2ML IJ SOLN
4.0000 mg | Freq: Four times a day (QID) | INTRAMUSCULAR | Status: DC | PRN
  Administered 2023-11-27 – 2023-12-01 (×2): 4 mg via INTRAVENOUS
  Filled 2023-11-26 (×3): qty 2

## 2023-11-26 MED ORDER — HYDROXYUREA 500 MG PO CAPS
1500.0000 mg | ORAL_CAPSULE | Freq: Every day | ORAL | Status: DC
  Administered 2023-11-27 – 2023-12-03 (×7): 1500 mg via ORAL
  Filled 2023-11-26 (×7): qty 3

## 2023-11-26 MED ORDER — KETOROLAC TROMETHAMINE 15 MG/ML IJ SOLN
15.0000 mg | Freq: Four times a day (QID) | INTRAMUSCULAR | Status: AC | PRN
  Administered 2023-11-27 – 2023-12-01 (×14): 15 mg via INTRAVENOUS
  Filled 2023-11-26 (×15): qty 1

## 2023-11-26 MED ORDER — HEPARIN SODIUM (PORCINE) 5000 UNIT/ML IJ SOLN: 5000.0000 [IU] | Freq: Three times a day (TID) | INTRAMUSCULAR | Status: DC

## 2023-11-26 MED ORDER — NALOXONE HCL 0.4 MG/ML IJ SOLN: 0.4000 mg | INTRAMUSCULAR | Status: DC | PRN

## 2023-11-26 MED ORDER — HYDROMORPHONE 1 MG/ML IV SOLN
INTRAVENOUS | Status: DC
  Administered 2023-11-26: 30 mg via INTRAVENOUS
  Filled 2023-11-26: qty 30

## 2023-11-26 MED ORDER — LEVOFLOXACIN IN D5W 750 MG/150ML IV SOLN
750.0000 mg | Freq: Once | INTRAVENOUS | Status: AC
  Administered 2023-11-26: 750 mg via INTRAVENOUS
  Filled 2023-11-26: qty 150

## 2023-11-26 NOTE — H&P (Signed)
 History and Physical    Manuel Mcguire FMW:[id number] DOB: 01-06-1991 DOA: 11/26/2023  PCP: Pcp, No  Patient coming from: home  I have personally briefly reviewed patient's old medical records in [facility]  Chief Complaint:  abdominal pain and n/v   HPI: Manuel Mcguire is a 33 y.o. male with medical history significant of  Sickle cell anemia, with interim hx of  of admission  6/14-/6/20 for sickle crisis as well as refractory n/v with noted gall stones and biliary colic without acute cholecystitis.  Patient during that admission was stabilized treated from Sickle cell crisis and then underwent elective cholecystectomy on 11/22/23. Patient per notes did well after surgery and was discharged on 6/20.  Patient now return s/p discharged less than 24 hours later with increase abdominal pain and n/v. Mild cough no sob, persistent abdominal pain.  ED Course:   On evaluation in ED patient was noted to have slightly elevated lfts with  upward trend as well as , possible asp/pneumonitis. Patient was seen by surgery who note with negative enzyme and benign abdominal exam noted no acute surgical issue but did recommend trending lfts while admitted.   Vitals 99, bp 130/93, hr 118, rr 18 sat 100%  Na 138, K 3.7, glu 119, cr 0.83, AST 90 (69), ALT 116 ( 67)  Tbili 2.7 ( 1.8) Lipase 33 WBC 19.9, plt 354, plt 354 UA: neg Cxr:NAD  CTAB IMPRESSION: 1. Mild nodular airspace disease in the RIGHT lower lobe suggest mild aspiration pneumonitis or pneumonia. 2. Post cholecystectomy. No complicating features. No fluid collections or biliary obstruction. 3. 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 Tx zofran  dilaudid , NS 1L  Review of Systems: As per HPI otherwise 10 point review of systems negative.   Past Medical History:  Diagnosis Date   Scoliosis    Sickle cell anemia with pain (HCC)     Past Surgical History:  Procedure Laterality Date   BACK SURGERY     unaware of  particular surgery   CHOLECYSTECTOMY N/A 11/22/2023   Procedure: LAPAROSCOPIC CHOLECYSTECTOMY WITH INTRAOPERATIVE CHOLANGIOGRAM;  Surgeon: Signe Mitzie LABOR, MD;  Location: [facility]S;  Service: General;  Laterality: N/A;  Poss IOC, ICG   HIP PINNING Left    Pt unaware of particulars     reports that he has never smoked. He does not have any smokeless tobacco history on file. He reports that he does not drink alcohol and does not use drugs.  No Known Allergies  Family History  Problem Relation Age of Onset   Diabetes Mother    Hypertension Mother    Sickle cell anemia Sister     Prior to Admission medications   Medication Sig Start Date End Date Taking? Authorizing Provider  cholecalciferol  (VITAMIN D3) 25 MCG (1000 UNIT) tablet Take 1,000 Units by mouth daily.    [provider]  folic acid  (FOLVITE ) 1 MG tablet Take 1 tablet (1 mg total) by mouth daily. 10/27/12   Alvia Rosaline LABOR, MD  hydroxyurea  (HYDREA ) 500 MG capsule Take 3 capsules (1,500 mg total) by mouth daily. May take with food to minimize GI side effects. 10/27/12   Alvia Rosaline LABOR, MD  lisinopril  (ZESTRIL ) 2.5 MG tablet Take 2.5 mg by mouth daily.    [provider]  ondansetron  (ZOFRAN -ODT) 4 MG disintegrating tablet 4mg  ODT q4 hours prn nausea/vomit 11/06/23   Patt Alm Macho, MD  oxyCODONE  (OXY IR/ROXICODONE ) 5 MG immediate release tablet Take 5 mg by mouth every  6 (six) hours as needed for severe pain.    [provider]    Physical Exam: Vitals:   11/26/23 1526 11/26/23 1757  BP: (!) 130/93 122/85  Pulse: (!) 118 95  Resp: 18 18  Temp: 99 F (37.2 C) 98.5 F (36.9 C)  TempSrc:  Oral  SpO2: 100% 100%    Constitutional: NAD, calm, comfortable Vitals:   11/26/23 1526 11/26/23 1757  BP: (!) 130/93 122/85  Pulse: (!) 118 95  Resp: 18 18  Temp: 99 F (37.2 C) 98.5 F (36.9 C)  TempSrc:  Oral  SpO2: 100% 100%   Eyes: PERRL, lids and conjunctivae normal ENMT: Mucous  membranes are moist. Posterior pharynx clear of any exudate or lesions.Normal dentition.  Neck: normal, supple, no masses, no thyromegaly Respiratory: clear to auscultation bilaterally, no wheezing, no crackles. Normal respiratory effort. No accessory muscle use.  Cardiovascular: Regular rate and rhythm, no murmurs / rubs / gallops. No extremity edema. 2+ pedal pulses. No carotid bruits.  Abdomen: +tenderness RUQ,  mild distention ,no masses palpated. No hepatosplenomegaly. Bowel sounds positive.  Musculoskeletal: no clubbing / cyanosis. No joint deformity upper and lower extremities. Good ROM, no contractures. Normal muscle tone.  Skin: no rashes, lesions, ulcers. No induration Neurologic: CN 2-12 grossly intact. Sensation intact, Strength 5/5 in all 4.  Psychiatric: Normal judgment and insight. Alert and oriented x 3. Normal mood.    Labs on Admission: I have personally reviewed following labs and imaging studies  CBC: Recent Labs  Lab 11/19/23 2121 11/20/23 0645 11/22/23 0610 11/23/23 0535 11/24/23 0543 11/25/23 0849 11/26/23 1915  WBC 11.3*   < > 13.3* 16.8* 15.9* 12.7* 19.9*  NEUTROABS 7.3  --   --   --   --   --  15.8*  HGB 8.5*   < > 7.7* 7.7* 7.5* 7.3* 8.5*  HCT 22.8*   < > 21.7* 21.5* 21.6* 20.4* 24.0*  MCV 103.6*   < > 106.9* 107.0* 105.9* 104.6* 107.6*  PLT 201   < > 162 132* 197 232 354   < > = values in this interval not displayed.   Basic Metabolic Panel: Recent Labs  Lab 11/19/23 2121 11/20/23 0645 11/22/23 0610 11/25/23 0849 11/26/23 1605  NA 140 136 137 138 138  K 4.0 3.7 4.4 4.1 3.7  CL 106 104 103 103 106  CO2 23 25 25 27 22   GLUCOSE 113* 102* 89 92 119*  BUN 13 12 11 10 11   CREATININE 0.95 0.94 0.66 0.81 0.83  CALCIUM 9.4 9.2 9.0 9.0 9.9  MG  --  2.1  --   --   --   PHOS  --  5.0*  --   --   --    GFR: Estimated Creatinine Clearance: 117.2 mL/min (by C-G formula based on SCr of 0.83 mg/dL). Liver Function Tests: Recent Labs  Lab 11/19/23 2121  11/20/23 0645 11/22/23 0610 11/25/23 0849 11/26/23 1605  AST 44* 37 44* 69* 90*  ALT 26 22 23  67* 116*  ALKPHOS 89 77 79 89 118  BILITOT 2.0* 2.3* 1.6* 1.8* 2.7*  PROT 9.0* 8.6* 7.8 7.7 9.2*  ALBUMIN 4.6 4.0 3.8 3.7 4.4   Recent Labs  Lab 11/19/23 2121 11/26/23 1605  LIPASE 31 33   No results for input(s): AMMONIA in the last 168 hours. Coagulation Profile: No results for input(s): INR, PROTIME in the last 168 hours. Cardiac Enzymes: No results for input(s): CKTOTAL, CKMB, CKMBINDEX, TROPONINI in the last 168  hours. BNP (last 3 results) No results for input(s): PROBNP in the last 8760 hours. HbA1C: No results for input(s): HGBA1C in the last 72 hours. CBG: No results for input(s): GLUCAP in the last 168 hours. Lipid Profile: No results for input(s): CHOL, HDL, LDLCALC, TRIG, CHOLHDL, LDLDIRECT in the last 72 hours. Thyroid Function Tests: No results for input(s): TSH, T4TOTAL, FREET4, T3FREE, THYROIDAB in the last 72 hours. Anemia Panel: No results for input(s): VITAMINB12, FOLATE, FERRITIN, TIBC, IRON, RETICCTPCT in the last 72 hours. Urine analysis:    Component Value Date/Time   COLORURINE AMBER (A) 11/26/2023 1808   APPEARANCEUR CLEAR 11/26/2023 1808   LABSPEC 1.014 11/26/2023 1808   PHURINE 5.0 11/26/2023 1808   GLUCOSEU NEGATIVE 11/26/2023 1808   HGBUR SMALL (A) 11/26/2023 1808   BILIRUBINUR NEGATIVE 11/26/2023 1808   KETONESUR NEGATIVE 11/26/2023 1808   PROTEINUR 100 (A) 11/26/2023 1808   UROBILINOGEN 1.0 10/27/2010 0630   NITRITE NEGATIVE 11/26/2023 1808   LEUKOCYTESUR NEGATIVE 11/26/2023 1808    Radiological Exams on Admission: DG Chest 2 View Result Date: 11/26/2023 CLINICAL DATA:  Sickle cell.  Acute chest pain. EXAM: CHEST - 2 VIEW COMPARISON:  Chest x-ray 06/04/2023. CT abdomen and pelvis same day. FINDINGS: The heart is enlarged, unchanged. There is no focal lung infiltrate, pleural effusion or  pneumothorax. No acute fractures are seen. Extensive thoracic spinal hardware present. IMPRESSION: Cardiomegaly. No acute cardiopulmonary process. Electronically Signed   By: Greig Pique M.D.   On: 11/26/2023 20:27   CT ABDOMEN PELVIS W CONTRAST Result Date: 11/26/2023 CLINICAL DATA:  Abdominal pain. Recent gallbladder surgery discharge 1 day prior. EXAM: CT ABDOMEN AND PELVIS WITH CONTRAST TECHNIQUE: Multidetector CT imaging of the abdomen and pelvis was performed using the standard protocol following bolus administration of intravenous contrast. RADIATION DOSE REDUCTION: This exam was performed according to the departmental dose-optimization program which includes automated exposure control, adjustment of the mA and/or kV according to patient size and/or use of iterative reconstruction technique. CONTRAST:  OMNIPAQUE  IOHEXOL  300 MG/ML  SOLN COMPARISON:  CT 11/16/2023 FINDINGS: Lower chest: Mild nodular airspace disease in the lateral aspect the RIGHT lower lobe (image 10/series 6. Hepatobiliary: No focal hepatic lesion. Postcholecystectomy. No biliary dilatation. No abnormal fluid collections in the gallbladder fossa. No intrahepatic or extra hepatic biliary duct dilatation. Pancreas: Pancreas is normal. No ductal dilatation. No pancreatic inflammation. Spleen: Hyperdense small spleen suggest also infarction Adrenals/urinary tract: Adrenal glands and kidneys are normal. The ureters and bladder normal. Stomach/Bowel: Stomach, small bowel, appendix, and cecum are normal. The colon and rectosigmoid colon are normal. Vascular/Lymphatic: Abdominal aorta is normal caliber. No periportal or retroperitoneal adenopathy. No pelvic adenopathy. Reproductive: Prostate unremarkable Other: No free air or abscess in the abdomen pelvis. Musculoskeletal: LEFT hip internal fixation. Posterior lumbar fusion. Diffuse sclerosis. IMPRESSION: 1. Mild nodular airspace disease in the RIGHT lower lobe suggest mild aspiration  pneumonitis or pneumonia. 2. Post cholecystectomy. No complicating features. No fluid collections or biliary obstruction. 3. 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 Electronically Signed   By: Jackquline Boxer M.D.   On: 11/26/2023 19:03    EKG: Independently reviewed. N/a  Assessment/Plan   Sickle Crisis  .admit to med tele  - placed on sickle protocol with dilaudid  pca  -continue with ivfs  - consult to hematology in am   Probable Aspiration pneumonitis  -mild increase baseline leukocytosis ,low grade temp -start levaquin /metronidazole   -f/u with blood cultures  - pulmonary toilet   Post-Op abdominal pain with  associated nausea and vomiting  Elevated lfts  -abdominal imaging negative  -will trend lfts  -? Concern retained stones , MRCP to be complete -surgery following / f/u on further recs     DVT prophylaxis: heparin  Code Status: full/ as discussed per patient wishes in event of cardiac arrest  Family Communication: none at bedside Disposition Plan: patient  expected to be admitted greater than 2 midnights  Consults called: Surgery Dr Ebbie Admission status: full   Camila DELENA Ned MD [facility]   If 7PM-7AM, please contact night-coverage [email or web address] Password [email or web address]  11/26/2023, 9:15 PM

## 2023-11-26 NOTE — Progress Notes (Signed)
   Subjective/Chief Complaint: Returns after dc yesterday with n/v, unable to tol po well, no real ab pain, has had a bm. Was having some of these symptoms it sounds like related to sickle cell crisis   Objective: Vital signs in last 24 hours: Temp:  [98.5 F (36.9 C)-99 F (37.2 C)] 98.5 F (36.9 C) (06/21 1757) Pulse Rate:  [95-118] 95 (06/21 1757) Resp:  [18] 18 (06/21 1757) BP: (122-130)/(85-93) 122/85 (06/21 1757) SpO2:  [100 %] 100 % (06/21 1757)    Intake/Output from previous day: No intake/output data recorded. Intake/Output this shift: No intake/output data recorded.  Ab soft approp tender, incisions clean  Lab Results:  Recent Labs    11/25/23 0849 11/26/23 1915  WBC 12.7* 19.9*  HGB 7.3* 8.5*  HCT 20.4* 24.0*  PLT 232 354   BMET Recent Labs    11/25/23 0849 11/26/23 1605  NA 138 138  K 4.1 3.7  CL 103 106  CO2 27 22  GLUCOSE 92 119*  BUN 10 11  CREATININE 0.81 0.83  CALCIUM 9.0 9.9   PT/INR No results for input(s): LABPROT, INR in the last 72 hours. ABG No results for input(s): PHART, HCO3 in the last 72 hours.  Invalid input(s): PCO2, PO2  Studies/Results: CT ABDOMEN PELVIS W CONTRAST Result Date: 11/26/2023 CLINICAL DATA:  Abdominal pain. Recent gallbladder surgery discharge 1 day prior. EXAM: CT ABDOMEN AND PELVIS WITH CONTRAST TECHNIQUE: Multidetector CT imaging of the abdomen and pelvis was performed using the standard protocol following bolus administration of intravenous contrast. RADIATION DOSE REDUCTION: This exam was performed according to the departmental dose-optimization program which includes automated exposure control, adjustment of the mA and/or kV according to patient size and/or use of iterative reconstruction technique. CONTRAST:  OMNIPAQUE  IOHEXOL  300 MG/ML  SOLN COMPARISON:  CT 11/16/2023 FINDINGS: Lower chest: Mild nodular airspace disease in the lateral aspect the RIGHT lower lobe (image 10/series 6.  Hepatobiliary: No focal hepatic lesion. Postcholecystectomy. No biliary dilatation. No abnormal fluid collections in the gallbladder fossa. No intrahepatic or extra hepatic biliary duct dilatation. Pancreas: Pancreas is normal. No ductal dilatation. No pancreatic inflammation. Spleen: Hyperdense small spleen suggest also infarction Adrenals/urinary tract: Adrenal glands and kidneys are normal. The ureters and bladder normal. Stomach/Bowel: Stomach, small bowel, appendix, and cecum are normal. The colon and rectosigmoid colon are normal. Vascular/Lymphatic: Abdominal aorta is normal caliber. No periportal or retroperitoneal adenopathy. No pelvic adenopathy. Reproductive: Prostate unremarkable Other: No free air or abscess in the abdomen pelvis. Musculoskeletal: LEFT hip internal fixation. Posterior lumbar fusion. Diffuse sclerosis. IMPRESSION: 1. Mild nodular airspace disease in the RIGHT lower lobe suggest mild aspiration pneumonitis or pneumonia. 2. Post cholecystectomy. No complicating features. No fluid collections or biliary obstruction. 3. 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 Electronically Signed   By: Jackquline Boxer M.D.   On: 11/26/2023 19:03    Anti-infectives: Anti-infectives (From admission, onward)    None       Assessment/Plan: POD 4 lap chole- CC -TB up, ct normal, would admit for sickle cell/question of pna -from my standpoint would check lfts in am -if truly has elevated direct hyperbilirubinemia after lap chole may need mrcp to rule out a stone -will follow up in am    Donnice Bury 11/26/2023

## 2023-11-26 NOTE — ED Provider Notes (Signed)
 [place] EMERGENCY DEPARTMENT AT [facility] Provider Note   CSN: [id number] Arrival date & time: 11/26/23  1513     Patient presents with: Abdominal Pain   Manuel Mcguire is a 33 y.o. male.   33 year old male presenting with abdominal pain.  Patient was seen last week for same issue, underwent cholecystectomy on Tuesday and was discharged yesterday.  He continues to have vomiting and diffuse abdominal pain.  Patient has history of sickle cell disease.  No fevers at home, no dysuria/hematuria, no chest pain/shortness of breath but he does mention that he has been coughing since yesterday.   Abdominal Pain      Prior to Admission medications   Medication Sig Start Date End Date Taking? Authorizing Provider  cholecalciferol  (VITAMIN D3) 25 MCG (1000 UNIT) tablet Take 1,000 Units by mouth daily.    [provider]  folic acid  (FOLVITE ) 1 MG tablet Take 1 tablet (1 mg total) by mouth daily. 10/27/12   Alvia Rosaline LABOR, MD  hydroxyurea  (HYDREA ) 500 MG capsule Take 3 capsules (1,500 mg total) by mouth daily. May take with food to minimize GI side effects. 10/27/12   Alvia Rosaline LABOR, MD  lisinopril  (ZESTRIL ) 2.5 MG tablet Take 2.5 mg by mouth daily.    [provider]  ondansetron  (ZOFRAN -ODT) 4 MG disintegrating tablet 4mg  ODT q4 hours prn nausea/vomit 11/06/23   Patt Alm Macho, MD  oxyCODONE  (OXY IR/ROXICODONE ) 5 MG immediate release tablet Take 5 mg by mouth every 6 (six) hours as needed for severe pain.    [provider]    Allergies: Patient has no known allergies.    Review of Systems  Gastrointestinal:  Positive for abdominal pain.    Updated Vital Signs  Vitals:   11/26/23 1526 11/26/23 1757  BP: (!) 130/93 122/85  Pulse: (!) 118 95  Resp: 18 18  Temp: 99 F (37.2 C) 98.5 F (36.9 C)  TempSrc:  Oral  SpO2: 100% 100%     Physical Exam Vitals and nursing note reviewed.  Constitutional:      General: He is in  acute distress.  HENT:     Head: Normocephalic.   Cardiovascular:     Rate and Rhythm: Regular rhythm. Tachycardia present.     Heart sounds: Normal heart sounds.  Pulmonary:     Effort: Pulmonary effort is normal.     Breath sounds: Normal breath sounds.  Abdominal:     Palpations: Abdomen is soft.     Tenderness: There is generalized abdominal tenderness.     Comments: Several laparoscopic port site scars from recent cholecystectomy, no obvious infection or drainage from these sites   Musculoskeletal:     Comments: Moves all extremities spontaneously without difficulty   Skin:    General: Skin is warm and dry.   Neurological:     Mental Status: He is alert and oriented to person, place, and time.     (all labs ordered are listed, but only abnormal results are displayed) Labs Reviewed  COMPREHENSIVE METABOLIC PANEL WITH GFR - Abnormal; Notable for the following components:      Result Value   Glucose, Bld 119 (*)    Total Protein 9.2 (*)    AST 90 (*)    ALT 116 (*)    Total Bilirubin 2.7 (*)    All other components within normal limits  URINALYSIS, ROUTINE W REFLEX MICROSCOPIC - Abnormal; Notable for the following components:   Color, Urine AMBER (*)  Hgb urine dipstick SMALL (*)    Protein, ur 100 (*)    Bacteria, UA RARE (*)    All other components within normal limits  CBC WITH DIFFERENTIAL/PLATELET - Abnormal; Notable for the following components:   WBC 19.9 (*)    RBC 2.23 (*)    Hemoglobin 8.5 (*)    HCT 24.0 (*)    MCV 107.6 (*)    MCH 38.1 (*)    RDW 19.8 (*)    nRBC 0.7 (*)    Neutro Abs 15.8 (*)    Monocytes Absolute 2.1 (*)    Abs Immature Granulocytes 0.11 (*)    All other components within normal limits  LIPASE, BLOOD  CBC WITH DIFFERENTIAL/PLATELET  COMPREHENSIVE METABOLIC PANEL WITH GFR    EKG: None  Radiology: DG Chest 2 View Result Date: 11/26/2023 CLINICAL DATA:  Sickle cell.  Acute chest pain. EXAM: CHEST - 2 VIEW COMPARISON:   Chest x-ray 06/04/2023. CT abdomen and pelvis same day. FINDINGS: The heart is enlarged, unchanged. There is no focal lung infiltrate, pleural effusion or pneumothorax. No acute fractures are seen. Extensive thoracic spinal hardware present. IMPRESSION: Cardiomegaly. No acute cardiopulmonary process. Electronically Signed   By: Greig Pique M.D.   On: 11/26/2023 20:27   CT ABDOMEN PELVIS W CONTRAST Result Date: 11/26/2023 CLINICAL DATA:  Abdominal pain. Recent gallbladder surgery discharge 1 day prior. EXAM: CT ABDOMEN AND PELVIS WITH CONTRAST TECHNIQUE: Multidetector CT imaging of the abdomen and pelvis was performed using the standard protocol following bolus administration of intravenous contrast. RADIATION DOSE REDUCTION: This exam was performed according to the departmental dose-optimization program which includes automated exposure control, adjustment of the mA and/or kV according to patient size and/or use of iterative reconstruction technique. CONTRAST:  OMNIPAQUE  IOHEXOL  300 MG/ML  SOLN COMPARISON:  CT 11/16/2023 FINDINGS: Lower chest: Mild nodular airspace disease in the lateral aspect the RIGHT lower lobe (image 10/series 6. Hepatobiliary: No focal hepatic lesion. Postcholecystectomy. No biliary dilatation. No abnormal fluid collections in the gallbladder fossa. No intrahepatic or extra hepatic biliary duct dilatation. Pancreas: Pancreas is normal. No ductal dilatation. No pancreatic inflammation. Spleen: Hyperdense small spleen suggest also infarction Adrenals/urinary tract: Adrenal glands and kidneys are normal. The ureters and bladder normal. Stomach/Bowel: Stomach, small bowel, appendix, and cecum are normal. The colon and rectosigmoid colon are normal. Vascular/Lymphatic: Abdominal aorta is normal caliber. No periportal or retroperitoneal adenopathy. No pelvic adenopathy. Reproductive: Prostate unremarkable Other: No free air or abscess in the abdomen pelvis. Musculoskeletal: LEFT hip  internal fixation. Posterior lumbar fusion. Diffuse sclerosis. IMPRESSION: 1. Mild nodular airspace disease in the RIGHT lower lobe suggest mild aspiration pneumonitis or pneumonia. 2. Post cholecystectomy. No complicating features. No fluid collections or biliary obstruction. 3. 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 Electronically Signed   By: Jackquline Boxer M.D.   On: 11/26/2023 19:03     .Critical Care  Performed by: Glendia Rocky SAILOR, PA-C Authorized by: Glendia Rocky SAILOR, PA-C   Critical care provider statement:    Critical care time (minutes):  40   Critical care time was exclusive of:  Separately billable procedures and treating other patients and teaching time   Critical care was necessary to treat or prevent imminent or life-threatening deterioration of the following conditions: sickle cell pain crisis, pneumonia.   Critical care was time spent personally by me on the following activities:  Ordering and performing treatments and interventions, development of treatment plan with patient or surrogate, ordering and review of  laboratory studies, discussions with consultants, ordering and review of radiographic studies, evaluation of patient's response to treatment, re-evaluation of patient's condition, examination of patient and review of old charts   I assumed direction of critical care for this patient from another provider in my specialty: no     Care discussed with: admitting provider      Medications Ordered in the ED  levofloxacin  (LEVAQUIN ) IVPB 750 mg (has no administration in time range)  HYDROmorphone  (DILAUDID ) injection 1 mg (1 mg Intravenous Given 11/26/23 1737)  ondansetron  (ZOFRAN ) injection 4 mg (4 mg Intravenous Given 11/26/23 1737)  sodium chloride  0.9 % bolus 1,000 mL (0 mLs Intravenous Stopped 11/26/23 1944)  iohexol  (OMNIPAQUE ) 300 MG/ML solution 100 mL (100 mLs Intravenous Contrast Given 11/26/23 1818)  HYDROmorphone  (DILAUDID ) injection 2 mg (2 mg  Intravenous Given 11/26/23 1858)  ondansetron  (ZOFRAN ) injection 4 mg (4 mg Intravenous Given 11/26/23 1942)  HYDROmorphone  (DILAUDID ) injection 1 mg (1 mg Intravenous Given 11/26/23 2013)                                    Medical Decision Making This patient presents to the ED for concern of abdominal pain, this involves an extensive number of treatment options, and is a complaint that carries with it a high risk of complications and morbidity.  The differential diagnosis includes choledocholithiasis, cholangitis, sickle cell pain crisis, pneumonia, acute chest syndrome.   Co morbidities that complicate the patient evaluation  Sickle cell disease, status post laparoscopic cholecystectomy 6/17   Additional history obtained:  Additional history obtained from record review External records from outside source obtained and reviewed including recent hospital discharge summary   Lab Tests:  I Ordered, and personally interpreted labs.  The pertinent results include: CBC notable for worsening leukocytosis, 19.9 today whereas it was 12.7 yesterday, otherwise CBC relatively unchanged as compared to previous.  CMP notable for elevations in AST and ALT as well as total bilirubin of 2.7.  Lipase within normal limits.   Imaging Studies ordered:  I ordered imaging studies including CT abdomen/pelvis, CXR  I independently visualized and interpreted imaging which showed  CT: 1. Mild nodular airspace disease in the RIGHT lower lobe suggest mild aspiration pneumonitis or pneumonia. 2. Post cholecystectomy. No complicating features. No fluid collections or biliary obstruction. 3. 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 CXR: Cardiomegaly. No acute cardiopulmonary process.   I agree with the radiologist interpretation   Cardiac Monitoring: / EKG:  The patient was maintained on a cardiac monitor.  I personally viewed and interpreted the cardiac monitored which showed an underlying  rhythm of: sinus tachycardia   Consultations Obtained:  I requested consultation with general surgery and hospitalist,  and discussed lab and imaging findings as well as pertinent plan - they recommend: I spoke with the general surgeon Dr. Ebbie, he is concerned that patient's LFTs and total bilirubin are elevated as compared to yesterday, will come and evaluate this patient but does feel that he is appropriate for admission with the hospitalist service with plan to trend LFTs and reevaluate patient in the morning.  I spoke with the hospitalist Dr.  Debby who is in agreement with this plan, requested Levaquin .   Problem List / ED Course / Critical interventions / Medication management   I ordered medication including dilaudid   for pain, Levaquin  for suspected PNA Reevaluation of the patient after these medicines showed that the patient  improved I have reviewed the patients home medicines and have made adjustments as needed   Social Determinants of Health:  Housing instability, financial instability   Test / Admission - Considered:  Physical exam is notable as above.  Patient has history of sickle cell, had a laparoscopic cholecystectomy performed 11/22/2023.  CT imaging is notable for possible right lower lobe infiltrate, given patient's history I am concerned that this could resulted in acute chest syndrome.  Patient's LFTs are elevated as compared to lab work performed yesterday.  I discussed this case with Dr. Ebbie with general surgery, see above for his recommendations.  I consulted with the hospitalist who is in agreement that this patient is appropriate for admission.    Amount and/or Complexity of Data Reviewed Labs: ordered. Radiology: ordered.  Risk Prescription drug management.        Final diagnoses:  Pneumonia of right lower lobe due to infectious organism  Sickle cell pain crisis (HCC)  Nausea and vomiting, unspecified vomiting type    ED Discharge  Orders     None          Glendia Rocky LOISE DEVONNA 11/26/23 2045    Laurice Maude BROCKS, MD 11/26/23 [phone]

## 2023-11-26 NOTE — ED Triage Notes (Signed)
 Pt states here last Friday admittied and had Gallbladder surgery. DC yesterday, c/o abdominal pain and vomiting since last night.

## 2023-11-27 ENCOUNTER — Inpatient Hospital Stay (HOSPITAL_COMMUNITY)

## 2023-11-27 DIAGNOSIS — D5700 Hb-SS disease with crisis, unspecified: Secondary | ICD-10-CM | POA: Diagnosis not present

## 2023-11-27 DIAGNOSIS — R1084 Generalized abdominal pain: Secondary | ICD-10-CM | POA: Diagnosis not present

## 2023-11-27 DIAGNOSIS — Z9049 Acquired absence of other specified parts of digestive tract: Secondary | ICD-10-CM | POA: Diagnosis not present

## 2023-11-27 DIAGNOSIS — R932 Abnormal findings on diagnostic imaging of liver and biliary tract: Secondary | ICD-10-CM | POA: Diagnosis not present

## 2023-11-27 DIAGNOSIS — R17 Unspecified jaundice: Secondary | ICD-10-CM | POA: Diagnosis not present

## 2023-11-27 LAB — COMPREHENSIVE METABOLIC PANEL WITH GFR
ALT: 64 U/L — ABNORMAL HIGH (ref 0–44)
AST: 40 U/L (ref 15–41)
Albumin: 3.2 g/dL — ABNORMAL LOW (ref 3.5–5.0)
Alkaline Phosphatase: 78 U/L (ref 38–126)
Anion gap: 6 (ref 5–15)
BUN: 10 mg/dL (ref 6–20)
CO2: 24 mmol/L (ref 22–32)
Calcium: 8.1 mg/dL — ABNORMAL LOW (ref 8.9–10.3)
Chloride: 102 mmol/L (ref 98–111)
Creatinine, Ser: 0.99 mg/dL (ref 0.61–1.24)
GFR, Estimated: >60 mL/min (ref >60)
Glucose, Bld: 133 mg/dL — ABNORMAL HIGH (ref 70–99)
Potassium: 3.2 mmol/L — ABNORMAL LOW (ref 3.5–5.1)
Sodium: 132 mmol/L — ABNORMAL LOW (ref 135–145)
Total Bilirubin: 1.9 mg/dL — ABNORMAL HIGH (ref 0.0–1.2)
Total Protein: 7.2 g/dL (ref 6.5–8.1)

## 2023-11-27 LAB — CBC
HCT: 20.5 % — ABNORMAL LOW (ref 39.0–52.0)
Hemoglobin: 7.3 g/dL — ABNORMAL LOW (ref 13.0–17.0)
MCH: 37.8 pg — ABNORMAL HIGH (ref 26.0–34.0)
MCHC: 35.6 g/dL (ref 30.0–36.0)
MCV: 106.2 fL — ABNORMAL HIGH (ref 80.0–100.0)
Platelets: 399 10*3/uL (ref 150–400)
RBC: 1.93 MIL/uL — ABNORMAL LOW (ref 4.22–5.81)
RDW: 18.9 % — ABNORMAL HIGH (ref 11.5–15.5)
WBC: 11.6 10*3/uL — ABNORMAL HIGH (ref 4.0–10.5)
nRBC: 1.5 % — ABNORMAL HIGH (ref 0.0–0.2)

## 2023-11-27 MED ORDER — GADOBUTROL 1 MMOL/ML IV SOLN
7.3000 mL | Freq: Once | INTRAVENOUS | Status: AC | PRN
  Administered 2023-11-27: 7.3 mL via INTRAVENOUS

## 2023-11-27 MED ORDER — HYDROMORPHONE 1 MG/ML IV SOLN
INTRAVENOUS | Status: DC
  Administered 2023-11-27: 5.5 mg via INTRAVENOUS
  Administered 2023-11-27: 6 mg via INTRAVENOUS
  Administered 2023-11-27: 4 mg via INTRAVENOUS
  Administered 2023-11-27: 8.5 mg via INTRAVENOUS
  Administered 2023-11-27: 6.5 mg via INTRAVENOUS
  Administered 2023-11-27: 12 mg via INTRAVENOUS
  Administered 2023-11-28: 4 mg via INTRAVENOUS
  Administered 2023-11-28 (×2): 30 mg via INTRAVENOUS
  Administered 2023-11-28: 9 mg via INTRAVENOUS
  Administered 2023-11-28: 4.5 mg via INTRAVENOUS
  Administered 2023-11-29: 3 mg via INTRAVENOUS
  Administered 2023-11-29: 30 mg via INTRAVENOUS
  Administered 2023-11-29: 7.5 mg via INTRAVENOUS
  Administered 2023-11-29: 0.5 mg via INTRAVENOUS
  Administered 2023-11-30: 30 mg via INTRAVENOUS
  Administered 2023-11-30: 17 mL via INTRAVENOUS
  Administered 2023-11-30: 30 mg via INTRAVENOUS
  Administered 2023-11-30: 7 mg via INTRAVENOUS
  Administered 2023-11-30: 30 mg via INTRAVENOUS
  Administered 2023-12-01: 8.5 mg via INTRAVENOUS
  Administered 2023-12-01: 5 mg via INTRAVENOUS
  Administered 2023-12-01: 6.5 mg via INTRAVENOUS
  Administered 2023-12-01: 30 mg via INTRAVENOUS
  Administered 2023-12-02: 10 mg via INTRAVENOUS
  Administered 2023-12-02: 30 mg via INTRAVENOUS
  Administered 2023-12-02: 8 mg via INTRAVENOUS
  Administered 2023-12-02 (×2): 1 mg via INTRAVENOUS
  Administered 2023-12-02: 4.5 mg via INTRAVENOUS
  Administered 2023-12-02: 1 mg via INTRAVENOUS
  Administered 2023-12-03: 7 mg via INTRAVENOUS
  Administered 2023-12-03 (×2): 5.5 mg via INTRAVENOUS
  Filled 2023-11-27 (×8): qty 30

## 2023-11-27 MED ORDER — SODIUM CHLORIDE 0.9 % IV SOLN
3.0000 g | Freq: Four times a day (QID) | INTRAVENOUS | Status: DC
  Administered 2023-11-27 – 2023-12-03 (×24): 3 g via INTRAVENOUS
  Filled 2023-11-27 (×24): qty 8

## 2023-11-27 NOTE — Plan of Care (Signed)

## 2023-11-27 NOTE — Progress Notes (Cosign Needed Addendum)
 Subjective: Manuel Mcguire is a 33 year old male with a medical history significant for sickle cell disease was admitted for nausea and vomiting in the setting of sickle cell pain crisis.  Of note, patient is status post lap cholecystectomy on 11/22/2023. Patient was discharged home on 11/25/2023 in stable condition.  Patient states that shortly after returning home, he started to experience intense nausea, along with vomiting.  He states that it has been very difficult to keep any food or medications down. Patient's nausea has persisted despite Zofran . Patient states that after several episodes of vomiting, he began to have sickle cell pain primarily to his low back and lower extremities.  Today, patient rates his pain as 7/10, constant, and throbbing.  Of note, patient is followed by [facility] hematology for his sickle cell care.  Patient CT of abdomen and pelvis shows mild nodular airspace disease to the right lower lobe that is suggestive of mild aspiration pneumonitis or pneumonia.  Also, postcholecystectomy without complicating features or fluid collections or biliary obstruction.  Patient has small hyperdense spleen consistent with autoinfarction.  Patient endorses abdominal discomfort.  He also complains of fatigue.  He denies hematemesis or hemoptysis.  No urinary symptoms including dysuria or hematuria.  No constipation or diarrhea.  Patient currently afebrile.   Objective:  Vital signs in last 24 hours:  Vitals:   11/27/23 0516 11/27/23 0625 11/27/23 0846 11/27/23 1007  BP:  129/79  113/71  Pulse:  84  80  Resp: 17 18 16    Temp:  98.3 F (36.8 C)  98.7 F (37.1 C)  TempSrc:  Oral  Oral  SpO2:  98%  96%  Weight:      Height:        Intake/Output from previous day:   Intake/Output Summary (Last 24 hours) at 11/27/2023 1036 Last data filed at 11/27/2023 0700 Gross per 24 hour  Intake 742.59 ml  Output 1000 ml  Net -257.41 ml    Physical Exam: General: Alert, awake, oriented  x3, in no acute distress.  HEENT: /AT PEERL, EOMI Neck: Trachea midline,  no masses, no thyromegal,y no JVD, no carotid bruit OROPHARYNX:  Moist, No exudate/ erythema/lesions.  Heart: Regular rate and rhythm, without murmurs, rubs, gallops, PMI non-displaced, no heaves or thrills on palpation.  Lungs: Clear to auscultation, no wheezing or rhonchi noted. No increased vocal fremitus resonant to percussion  Abdomen: Soft, tender, nondistended, positive bowel sounds, no masses no hepatosplenomegaly noted..  Neuro: No focal neurological deficits noted cranial nerves II through XII grossly intact. DTRs 2+ bilaterally upper and lower extremities. Strength 5 out of 5 in bilateral upper and lower extremities. Musculoskeletal: No warm swelling or erythema around joints, no spinal tenderness noted. Psychiatric: Patient alert and oriented x3, good insight and cognition, good recent to remote recall. Lymph node survey: No cervical axillary or inguinal lymphadenopathy noted.  Lab Results:  Basic Metabolic Panel:    Component Value Date/Time   NA 138 11/26/2023 1605   K 3.7 11/26/2023 1605   CL 106 11/26/2023 1605   CO2 22 11/26/2023 1605   BUN 11 11/26/2023 1605   CREATININE 0.83 11/26/2023 1605   GLUCOSE 119 (H) 11/26/2023 1605   CALCIUM 9.9 11/26/2023 1605   CBC:    Component Value Date/Time   WBC 19.9 (H) 11/26/2023 1915   HGB 8.5 (L) 11/26/2023 1915   HCT 24.0 (L) 11/26/2023 1915   PLT 354 11/26/2023 1915   MCV 107.6 (H) 11/26/2023 1915   NEUTROABS 15.8 (  H) 11/26/2023 1915   LYMPHSABS 1.8 11/26/2023 1915   MONOABS 2.1 (H) 11/26/2023 1915   EOSABS 0.1 11/26/2023 1915   BASOSABS 0.0 11/26/2023 1915    Recent Results (from the past 240 hours)  Surgical PCR screen     Status: Abnormal   Collection Time: 11/22/23  7:36 AM   Specimen: Nasal Mucosa; Nasal Swab  Result Value Ref Range Status   MRSA, PCR NEGATIVE NEGATIVE Final   Staphylococcus aureus POSITIVE (A) NEGATIVE Final     Comment: (NOTE) The Xpert SA Assay (FDA approved for NASAL specimens in patients 55 years of age and older), is one component of a comprehensive surveillance program. It is not intended to diagnose infection nor to guide or monitor treatment. Performed at [facility], [address] [place], [place] [address]     Studies/Results: DG Chest 2 View Result Date: 11/26/2023 CLINICAL DATA:  Sickle cell.  Acute chest pain. EXAM: CHEST - 2 VIEW COMPARISON:  Chest x-ray 06/04/2023. CT abdomen and pelvis same day. FINDINGS: The heart is enlarged, unchanged. There is no focal lung infiltrate, pleural effusion or pneumothorax. No acute fractures are seen. Extensive thoracic spinal hardware present. IMPRESSION: Cardiomegaly. No acute cardiopulmonary process. Electronically Signed   By: Greig Pique M.D.   On: 11/26/2023 20:27   CT ABDOMEN PELVIS W CONTRAST Result Date: 11/26/2023 CLINICAL DATA:  Abdominal pain. Recent gallbladder surgery discharge 1 day prior. EXAM: CT ABDOMEN AND PELVIS WITH CONTRAST TECHNIQUE: Multidetector CT imaging of the abdomen and pelvis was performed using the standard protocol following bolus administration of intravenous contrast. RADIATION DOSE REDUCTION: This exam was performed according to the departmental dose-optimization program which includes automated exposure control, adjustment of the mA and/or kV according to patient size and/or use of iterative reconstruction technique. CONTRAST:  OMNIPAQUE  IOHEXOL  300 MG/ML  SOLN COMPARISON:  CT 11/16/2023 FINDINGS: Lower chest: Mild nodular airspace disease in the lateral aspect the RIGHT lower lobe (image 10/series 6. Hepatobiliary: No focal hepatic lesion. Postcholecystectomy. No biliary dilatation. No abnormal fluid collections in the gallbladder fossa. No intrahepatic or extra hepatic biliary duct dilatation. Pancreas: Pancreas is normal. No ductal dilatation. No pancreatic inflammation. Spleen: Hyperdense  small spleen suggest also infarction Adrenals/urinary tract: Adrenal glands and kidneys are normal. The ureters and bladder normal. Stomach/Bowel: Stomach, small bowel, appendix, and cecum are normal. The colon and rectosigmoid colon are normal. Vascular/Lymphatic: Abdominal aorta is normal caliber. No periportal or retroperitoneal adenopathy. No pelvic adenopathy. Reproductive: Prostate unremarkable Other: No free air or abscess in the abdomen pelvis. Musculoskeletal: LEFT hip internal fixation. Posterior lumbar fusion. Diffuse sclerosis. IMPRESSION: 1. Mild nodular airspace disease in the RIGHT lower lobe suggest mild aspiration pneumonitis or pneumonia. 2. Post cholecystectomy. No complicating features. No fluid collections or biliary obstruction. 3. 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 Electronically Signed   By: Jackquline Boxer M.D.   On: 11/26/2023 19:03    Medications: Scheduled Meds:  folic acid   1 mg Oral Daily   heparin   5,000 Units Subcutaneous Q8H   HYDROmorphone    Intravenous Q4H   hydroxyurea   1,500 mg Oral Daily   multivitamin with minerals  1 tablet Oral Daily   senna-docusate  1 tablet Oral BID   Continuous Infusions:  ampicillin-sulbactam (UNASYN) IV     dextrose  5 % and 0.45 % NaCl 125 mL/hr at 11/26/23 2336   metronidazole  500 mg (11/27/23 1021)   PRN Meds:.acetaminophen  **OR** acetaminophen , albuterol, diphenhydrAMINE , ketorolac , naloxone  **AND** sodium chloride  flush, ondansetron  **  OR** ondansetron  (ZOFRAN ) IV, polyethylene glycol, sodium chloride  flush  Consultants: [facility]  Procedures: MRCP  Antibiotics: Unasyn  Assessment/Plan: Principal Problem:   Abdominal pain  Sickle cell disease with pain crisis: Continue weight-based Dilaudid  PCA without any changes.  Patient continues to have pain. Continue IV fluids, 0.45% saline at 125 mL/h Toradol  15 mg IV every 6 hours for total of 5 days Monitor vital signs very closely,  reevaluate pain scale regularly, and supplemental oxygen as needed. Continue supportive care. Supplemental oxygen as needed  Postop abdominal pain with intractable nausea and vomiting: Patient is status postcholecystectomy on 11/22/2023.  He returns for worsening nausea and vomiting.  Elevated LFTs. There is a concern for retained stones, MRCP pending.  Will review as results become available Surgery consulted, appreciate input Continue IV antiemetics as needed IV fluid hydration Continue pain control.  Leukocytosis: Multifactorial.  IV antibiotics initiated.  Continue to follow closely.  Labs in AM.  Probable aspiration pneumonitis: Start Unasyn Follow-up with blood cultures Monitor closely  Anemia of chronic disease in the setting of sickle cell disease: Hemoglobin is stable and consistent with his baseline.  There is no clinical indication for blood transfusion at this time.  Continue to monitor closely.  Isolated elevated AST and total bilirubin: Probably secondary to sickle cell disease.  Code Status: Full Code Family Communication: Patient's father and brother are at the bedside.  Concerned about readmissions over the past 5 weeks.  Discussed patient's current plan of care at length.  Disposition Plan: Not yet ready for discharge  Bertram Georgina Bald  DNP, APRN, FNP-C [facility] [address] [place], [place] [address] [phone]  If 7PM-7AM, please contact night-coverage.  11/27/2023, 10:36 AM  LOS: 1 day

## 2023-11-27 NOTE — Progress Notes (Signed)
 Subjective/Chief Complaint: Unchanged, he was having n/v prior to surgery   Objective: Vital signs in last 24 hours: Temp:  [98.3 F (36.8 C)-99 F (37.2 C)] 98.3 F (36.8 C) (06/22 0625) Pulse Rate:  [72-118] 84 (06/22 0625) Resp:  [16-23] 18 (06/22 0625) BP: (122-140)/(79-93) 129/79 (06/22 0625) SpO2:  [98 %-100 %] 98 % (06/22 0625) Weight:  [71 kg] 71 kg (06/21 2230)    Intake/Output from previous day: 06/21 0701 - 06/22 0700 In: 742.6 [P.O.:400; I.V.:242.6; IV Piggyback:100] Out: 1000 [Urine:1000] Intake/Output this shift: No intake/output data recorded.  Ab soft approp tender  Lab Results:  Recent Labs    11/25/23 0849 11/26/23 1915  WBC 12.7* 19.9*  HGB 7.3* 8.5*  HCT 20.4* 24.0*  PLT 232 354   BMET Recent Labs    11/25/23 0849 11/26/23 1605  NA 138 138  K 4.1 3.7  CL 103 106  CO2 27 22  GLUCOSE 92 119*  BUN 10 11  CREATININE 0.81 0.83  CALCIUM 9.0 9.9   PT/INR No results for input(s): LABPROT, INR in the last 72 hours. ABG No results for input(s): PHART, HCO3 in the last 72 hours.  Invalid input(s): PCO2, PO2  Studies/Results: DG Chest 2 View Result Date: 11/26/2023 CLINICAL DATA:  Sickle cell.  Acute chest pain. EXAM: CHEST - 2 VIEW COMPARISON:  Chest x-ray 06/04/2023. CT abdomen and pelvis same day. FINDINGS: The heart is enlarged, unchanged. There is no focal lung infiltrate, pleural effusion or pneumothorax. No acute fractures are seen. Extensive thoracic spinal hardware present. IMPRESSION: Cardiomegaly. No acute cardiopulmonary process. Electronically Signed   By: Greig Pique M.D.   On: 11/26/2023 20:27   CT ABDOMEN PELVIS W CONTRAST Result Date: 11/26/2023 CLINICAL DATA:  Abdominal pain. Recent gallbladder surgery discharge 1 day prior. EXAM: CT ABDOMEN AND PELVIS WITH CONTRAST TECHNIQUE: Multidetector CT imaging of the abdomen and pelvis was performed using the standard protocol following bolus administration of  intravenous contrast. RADIATION DOSE REDUCTION: This exam was performed according to the departmental dose-optimization program which includes automated exposure control, adjustment of the mA and/or kV according to patient size and/or use of iterative reconstruction technique. CONTRAST:  OMNIPAQUE  IOHEXOL  300 MG/ML  SOLN COMPARISON:  CT 11/16/2023 FINDINGS: Lower chest: Mild nodular airspace disease in the lateral aspect the RIGHT lower lobe (image 10/series 6. Hepatobiliary: No focal hepatic lesion. Postcholecystectomy. No biliary dilatation. No abnormal fluid collections in the gallbladder fossa. No intrahepatic or extra hepatic biliary duct dilatation. Pancreas: Pancreas is normal. No ductal dilatation. No pancreatic inflammation. Spleen: Hyperdense small spleen suggest also infarction Adrenals/urinary tract: Adrenal glands and kidneys are normal. The ureters and bladder normal. Stomach/Bowel: Stomach, small bowel, appendix, and cecum are normal. The colon and rectosigmoid colon are normal. Vascular/Lymphatic: Abdominal aorta is normal caliber. No periportal or retroperitoneal adenopathy. No pelvic adenopathy. Reproductive: Prostate unremarkable Other: No free air or abscess in the abdomen pelvis. Musculoskeletal: LEFT hip internal fixation. Posterior lumbar fusion. Diffuse sclerosis. IMPRESSION: 1. Mild nodular airspace disease in the RIGHT lower lobe suggest mild aspiration pneumonitis or pneumonia. 2. Post cholecystectomy. No complicating features. No fluid collections or biliary obstruction. 3. 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 Electronically Signed   By: Jackquline Boxer M.D.   On: 11/26/2023 19:03    Anti-infectives: Anti-infectives (From admission, onward)    Start     Dose/Rate Route Frequency Ordered Stop   11/26/23 2200  metroNIDAZOLE  (FLAGYL ) IVPB 500 mg  500 mg 100 mL/hr over 60 Minutes Intravenous Every 12 hours 11/26/23 2114     11/26/23 2045   levofloxacin  (LEVAQUIN ) IVPB 750 mg        750 mg 100 mL/hr over 90 Minutes Intravenous  Once 11/26/23 2039 11/26/23 2309       Assessment/Plan: POD 5 lap chole- CC Sickle crisis Likely aspiration pneumonitis -no labs today, await cmet -medicine has ordered mrcp due to elevated TB which is reasonable just to rule out a stone - I suspect the n/v was not ever related to gb  -we will follow     Manuel Mcguire 11/27/2023

## 2023-11-28 LAB — HEPATIC FUNCTION PANEL
ALT: 50 U/L — ABNORMAL HIGH (ref 0–44)
AST: 31 U/L (ref 15–41)
Albumin: 3.4 g/dL — ABNORMAL LOW (ref 3.5–5.0)
Alkaline Phosphatase: 74 U/L (ref 38–126)
Bilirubin, Direct: 0.3 mg/dL — ABNORMAL HIGH (ref 0.0–0.2)
Indirect Bilirubin: 0.9 mg/dL (ref 0.3–0.9)
Total Bilirubin: 1.2 mg/dL (ref 0.0–1.2)
Total Protein: 6.9 g/dL (ref 6.5–8.1)

## 2023-11-28 LAB — URINE CULTURE: Culture: NO GROWTH

## 2023-11-28 LAB — LACTATE DEHYDROGENASE: LDH: 251 U/L — ABNORMAL HIGH (ref 98–192)

## 2023-11-28 NOTE — Plan of Care (Signed)
 PCA and Toradol  appear to be managing pain well.  Surgery team has mentioned having an EGD performed but no consult to GI has been ordered.

## 2023-11-28 NOTE — Progress Notes (Signed)
 Subjective/Chief Complaint: Pt without complaint    Objective: Vital signs in last 24 hours: Temp:  [98 F (36.7 C)-98.9 F (37.2 C)] 98.8 F (37.1 C) (06/23 0424) Pulse Rate:  [70-80] 70 (06/23 0424) Resp:  [13-18] 15 (06/23 0741) BP: (113-133)/(71-86) 124/77 (06/23 0424) SpO2:  [96 %-99 %] 96 % (06/23 0424) Last BM Date : 11/26/23  Intake/Output from previous day: 06/22 0701 - 06/23 0700 In: 3372.4 [P.O.:1557; I.V.:1415.4; IV Piggyback:400] Out: 1500 [Urine:1500] Intake/Output this shift: Total I/O In: 4 [I.V.:4] Out: -   Abd: soft sore incisions CDI  Lab Results:  Recent Labs    11/26/23 1915 11/27/23 1520  WBC 19.9* 11.6*  HGB 8.5* 7.3*  HCT 24.0* 20.5*  PLT 354 399   BMET Recent Labs    11/26/23 1605 11/27/23 1520  NA 138 132*  K 3.7 3.2*  CL 106 102  CO2 22 24  GLUCOSE 119* 133*  BUN 11 10  CREATININE 0.83 0.99  CALCIUM 9.9 8.1*   PT/INR No results for input(s): LABPROT, INR in the last 72 hours. ABG No results for input(s): PHART, HCO3 in the last 72 hours.  Invalid input(s): PCO2, PO2  Studies/Results: MR ABDOMEN MRCP W WO CONTAST Result Date: 11/27/2023 CLINICAL DATA:  Jaundice.  Status post cholecystectomy EXAM: MRI ABDOMEN WITHOUT AND WITH CONTRAST (INCLUDING MRCP) TECHNIQUE: Multiplanar multisequence MR imaging of the abdomen was performed both before and after the administration of intravenous contrast. Heavily T2-weighted images of the biliary and pancreatic ducts were obtained, and three-dimensional MRCP images were rendered by post processing. CONTRAST:  7.10mL GADAVIST GADOBUTROL 1 MMOL/ML IV SOLN COMPARISON:  CT AP 11/26/2023 FINDINGS: Lower chest: No acute abnormality.  Heart size appears enlarged. Hepatobiliary: No suspicious liver lesion. Status post cholecystectomy. Increase caliber of the common bile duct measures 0.9 cm. The MRCP images are obscured by motion artifact. On the coronal T2 weighted sequences there is  suggestion of 2 small stones within the distal CBD which measure up to 3 mm, image 14/3. This cannot be confirmed on either the MRCP images or the axial T2 weighted sequences. Pancreas: No mass, inflammatory changes, or other parenchymal abnormality identified. Spleen:  Atrophic and calcified spleen. Adrenals/Urinary Tract: Signal void artifact from spinal hardware no signs of hydronephrosis. Suggestion of indeterminate upper pole lesion off the lateral cortex of the right kidney measuring 1.7 cm, image 27/16. This appears T1 isointense, limits assessment of the adrenal glands. Cannot assess for underlying enhancement due to artifact created by spinal hardware. No additional focal kidney abnormality noted. Stomach/Bowel: Stomach appears nondistended. No dilated loops of bowel noted. Vascular/Lymphatic: No pathologically enlarged lymph nodes identified. No abdominal aortic aneurysm demonstrated. Other:  No free fluid or fluid collections. Musculoskeletal: No suspicious bone lesions identified. IMPRESSION: 1. Status post cholecystectomy. Increase caliber of the common bile duct measures 0.9 cm. The MRCP images are obscured by motion artifact. On the coronal T2 weighted sequences there is suggestion of 2 small stones within the distal CBD which measure up to 3 mm. This cannot be confirmed on either the MRCP images or the axial T2 weighted sequences. Consider further evaluation with ERCP. 2. Suggestion of indeterminate upper pole lesion off the lateral cortex of the right kidney measuring 1.7 cm. This appears T1 isointense, limits assessment of the adrenal glands. Cannot assess for underlying enhancement due to artifact created by spinal hardware. Consider further evaluation with renal sonogram. Electronically Signed   By: Waddell Calk M.D.   On: 11/27/2023 13:32  DG Chest 2 View Result Date: 11/26/2023 CLINICAL DATA:  Sickle cell.  Acute chest pain. EXAM: CHEST - 2 VIEW COMPARISON:  Chest x-ray 06/04/2023. CT  abdomen and pelvis same day. FINDINGS: The heart is enlarged, unchanged. There is no focal lung infiltrate, pleural effusion or pneumothorax. No acute fractures are seen. Extensive thoracic spinal hardware present. IMPRESSION: Cardiomegaly. No acute cardiopulmonary process. Electronically Signed   By: Greig Pique M.D.   On: 11/26/2023 20:27   CT ABDOMEN PELVIS W CONTRAST Result Date: 11/26/2023 CLINICAL DATA:  Abdominal pain. Recent gallbladder surgery discharge 1 day prior. EXAM: CT ABDOMEN AND PELVIS WITH CONTRAST TECHNIQUE: Multidetector CT imaging of the abdomen and pelvis was performed using the standard protocol following bolus administration of intravenous contrast. RADIATION DOSE REDUCTION: This exam was performed according to the departmental dose-optimization program which includes automated exposure control, adjustment of the mA and/or kV according to patient size and/or use of iterative reconstruction technique. CONTRAST:  OMNIPAQUE  IOHEXOL  300 MG/ML  SOLN COMPARISON:  CT 11/16/2023 FINDINGS: Lower chest: Mild nodular airspace disease in the lateral aspect the RIGHT lower lobe (image 10/series 6. Hepatobiliary: No focal hepatic lesion. Postcholecystectomy. No biliary dilatation. No abnormal fluid collections in the gallbladder fossa. No intrahepatic or extra hepatic biliary duct dilatation. Pancreas: Pancreas is normal. No ductal dilatation. No pancreatic inflammation. Spleen: Hyperdense small spleen suggest also infarction Adrenals/urinary tract: Adrenal glands and kidneys are normal. The ureters and bladder normal. Stomach/Bowel: Stomach, small bowel, appendix, and cecum are normal. The colon and rectosigmoid colon are normal. Vascular/Lymphatic: Abdominal aorta is normal caliber. No periportal or retroperitoneal adenopathy. No pelvic adenopathy. Reproductive: Prostate unremarkable Other: No free air or abscess in the abdomen pelvis. Musculoskeletal: LEFT hip internal fixation. Posterior  lumbar fusion. Diffuse sclerosis. IMPRESSION: 1. Mild nodular airspace disease in the RIGHT lower lobe suggest mild aspiration pneumonitis or pneumonia. 2. Post cholecystectomy. No complicating features. No fluid collections or biliary obstruction. 3. 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 Electronically Signed   By: Jackquline Boxer M.D.   On: 11/26/2023 19:03    Anti-infectives: Anti-infectives (From admission, onward)    Start     Dose/Rate Route Frequency Ordered Stop   11/27/23 1200  Ampicillin-Sulbactam (UNASYN) 3 g in sodium chloride  0.9 % 100 mL IVPB        3 g 200 mL/hr over 30 Minutes Intravenous Every 6 hours 11/27/23 1030     11/26/23 2200  metroNIDAZOLE  (FLAGYL ) IVPB 500 mg        500 mg 100 mL/hr over 60 Minutes Intravenous Every 12 hours 11/26/23 2114 11/27/23 1121   11/26/23 2045  levofloxacin  (LEVAQUIN ) IVPB 750 mg        750 mg 100 mL/hr over 90 Minutes Intravenous  Once 11/26/23 2039 11/26/23 2309       Assessment/Plan: POD 6 lap chole- CC Sickle crisis CBD stone? On MRI Per GI Surgically stable  LFTs seem down trending    LOS: 2 days    Debby DELENA Shipper MD  11/28/2023

## 2023-11-28 NOTE — Progress Notes (Incomplete)
 Patient ID: LOVELACE CERVENY, male   DOB: Nov 08, 1990, 33 y.o.   MRN: [id number] Subjective: Manuel Mcguire is a 33 year old male with a medical history significant for sickle cell disease was admitted for sickle cell pain crisis also having abdominal pain with intractable nausea with vomiting.   Patient reports pain of 6/10 today S/P day 2  laparoscopic cholecystectomy Nausea improving. He denies vomiting, fever, headache, SOB, cough. No urinary symptoms.    Objective:  Vital signs in last 24 hours:  Vitals:   11/28/23 0424 11/28/23 0507 11/28/23 0741 11/28/23 1040  BP: 124/77   120/88  Pulse: 70   65  Resp: 14 14 15 16   Temp: 98.8 F (37.1 C)   98.9 F (37.2 C)  TempSrc: Oral   Oral  SpO2: 96%   98%  Weight:      Height:        Intake/Output from previous day:   Intake/Output Summary (Last 24 hours) at 11/28/2023 1048 Last data filed at 11/28/2023 0745 Gross per 24 hour  Intake 3376.36 ml  Output 1400 ml  Net 1976.36 ml     Physical Exam: General: Alert, awake, oriented x3, in no acute distress.  HEENT: [place]/AT [place], EOMI Neck: Trachea midline,  no masses, no thyromegal,y no JVD, no carotid bruit OROPHARYNX:  Moist, No exudate/ erythema/lesions.  Heart: Regular rate and rhythm, without murmurs, rubs, gallops, PMI non-displaced, no heaves or thrills on palpation.  Lungs: Clear to auscultation, no wheezing or rhonchi noted. No increased vocal fremitus resonant to percussion  Abdomen: Tenderness, 4 laparoscopy sight  Neuro: No focal neurological deficits noted cranial nerves II through XII grossly intact. DTRs 2+ bilaterally upper and lower extremities. Strength 5 out of 5 in bilateral upper and lower extremities. Musculoskeletal: generalize body tenderness. Psychiatric: Patient alert and oriented x3, good insight and cognition, good recent to remote recall. Lymph node survey: No cervical axillary or inguinal lymphadenopathy noted.  Lab Results:  Basic Metabolic Panel:     Component Value Date/Time   NA 132 (L) 11/27/2023 1520   K 3.2 (L) 11/27/2023 1520   CL 102 11/27/2023 1520   CO2 24 11/27/2023 1520   BUN 10 11/27/2023 1520   CREATININE 0.99 11/27/2023 1520   GLUCOSE 133 (H) 11/27/2023 1520   CALCIUM 8.1 (L) 11/27/2023 1520   CBC:    Component Value Date/Time   WBC 11.6 (H) 11/27/2023 1520   HGB 7.3 (L) 11/27/2023 1520   HCT 20.5 (L) 11/27/2023 1520   PLT 399 11/27/2023 1520   MCV 106.2 (H) 11/27/2023 1520   NEUTROABS 15.8 (H) 11/26/2023 1915   LYMPHSABS 1.8 11/26/2023 1915   MONOABS 2.1 (H) 11/26/2023 1915   EOSABS 0.1 11/26/2023 1915   BASOSABS 0.0 11/26/2023 1915    Recent Results (from the past 240 hours)  Surgical PCR screen     Status: Abnormal   Collection Time: 11/22/23  7:36 AM   Specimen: Nasal Mucosa; Nasal Swab  Result Value Ref Range Status   MRSA, PCR NEGATIVE NEGATIVE Final   Staphylococcus aureus POSITIVE (A) NEGATIVE Final    Comment: (NOTE) The Xpert SA Assay (FDA approved for NASAL specimens in patients 3 years of age and older), is one component of a comprehensive surveillance program. It is not intended to diagnose infection nor to guide or monitor treatment. Performed at [facility], [address] [place], [place] [address]     Studies/Results: MR ABDOMEN MRCP W WO CONTAST Result Date: 11/27/2023  CLINICAL DATA:  Jaundice.  Status post cholecystectomy EXAM: MRI ABDOMEN WITHOUT AND WITH CONTRAST (INCLUDING MRCP) TECHNIQUE: Multiplanar multisequence MR imaging of the abdomen was performed both before and after the administration of intravenous contrast. Heavily T2-weighted images of the biliary and pancreatic ducts were obtained, and three-dimensional MRCP images were rendered by post processing. CONTRAST:  7.31mL GADAVIST GADOBUTROL 1 MMOL/ML IV SOLN COMPARISON:  CT AP 11/26/2023 FINDINGS: Lower chest: No acute abnormality.  Heart size appears enlarged. Hepatobiliary: No suspicious liver  lesion. Status post cholecystectomy. Increase caliber of the common bile duct measures 0.9 cm. The MRCP images are obscured by motion artifact. On the coronal T2 weighted sequences there is suggestion of 2 small stones within the distal CBD which measure up to 3 mm, image 14/3. This cannot be confirmed on either the MRCP images or the axial T2 weighted sequences. Pancreas: No mass, inflammatory changes, or other parenchymal abnormality identified. Spleen:  Atrophic and calcified spleen. Adrenals/Urinary Tract: Signal void artifact from spinal hardware no signs of hydronephrosis. Suggestion of indeterminate upper pole lesion off the lateral cortex of the right kidney measuring 1.7 cm, image 27/16. This appears T1 isointense, limits assessment of the adrenal glands. Cannot assess for underlying enhancement due to artifact created by spinal hardware. No additional focal kidney abnormality noted. Stomach/Bowel: Stomach appears nondistended. No dilated loops of bowel noted. Vascular/Lymphatic: No pathologically enlarged lymph nodes identified. No abdominal aortic aneurysm demonstrated. Other:  No free fluid or fluid collections. Musculoskeletal: No suspicious bone lesions identified. IMPRESSION: 1. Status post cholecystectomy. Increase caliber of the common bile duct measures 0.9 cm. The MRCP images are obscured by motion artifact. On the coronal T2 weighted sequences there is suggestion of 2 small stones within the distal CBD which measure up to 3 mm. This cannot be confirmed on either the MRCP images or the axial T2 weighted sequences. Consider further evaluation with ERCP. 2. Suggestion of indeterminate upper pole lesion off the lateral cortex of the right kidney measuring 1.7 cm. This appears T1 isointense, limits assessment of the adrenal glands. Cannot assess for underlying enhancement due to artifact created by spinal hardware. Consider further evaluation with renal sonogram. Electronically Signed   By: Waddell Calk M.D.   On: 11/27/2023 13:32   DG Chest 2 View Result Date: 11/26/2023 CLINICAL DATA:  Sickle cell.  Acute chest pain. EXAM: CHEST - 2 VIEW COMPARISON:  Chest x-ray 06/04/2023. CT abdomen and pelvis same day. FINDINGS: The heart is enlarged, unchanged. There is no focal lung infiltrate, pleural effusion or pneumothorax. No acute fractures are seen. Extensive thoracic spinal hardware present. IMPRESSION: Cardiomegaly. No acute cardiopulmonary process. Electronically Signed   By: Greig Pique M.D.   On: 11/26/2023 20:27   CT ABDOMEN PELVIS W CONTRAST Result Date: 11/26/2023 CLINICAL DATA:  Abdominal pain. Recent gallbladder surgery discharge 1 day prior. EXAM: CT ABDOMEN AND PELVIS WITH CONTRAST TECHNIQUE: Multidetector CT imaging of the abdomen and pelvis was performed using the standard protocol following bolus administration of intravenous contrast. RADIATION DOSE REDUCTION: This exam was performed according to the departmental dose-optimization program which includes automated exposure control, adjustment of the mA and/or kV according to patient size and/or use of iterative reconstruction technique. CONTRAST:  OMNIPAQUE  IOHEXOL  300 MG/ML  SOLN COMPARISON:  CT 11/16/2023 FINDINGS: Lower chest: Mild nodular airspace disease in the lateral aspect the RIGHT lower lobe (image 10/series 6. Hepatobiliary: No focal hepatic lesion. Postcholecystectomy. No biliary dilatation. No abnormal fluid collections in the gallbladder fossa. No intrahepatic  or extra hepatic biliary duct dilatation. Pancreas: Pancreas is normal. No ductal dilatation. No pancreatic inflammation. Spleen: Hyperdense small spleen suggest also infarction Adrenals/urinary tract: Adrenal glands and kidneys are normal. The ureters and bladder normal. Stomach/Bowel: Stomach, small bowel, appendix, and cecum are normal. The colon and rectosigmoid colon are normal. Vascular/Lymphatic: Abdominal aorta is normal caliber. No periportal or  retroperitoneal adenopathy. No pelvic adenopathy. Reproductive: Prostate unremarkable Other: No free air or abscess in the abdomen pelvis. Musculoskeletal: LEFT hip internal fixation. Posterior lumbar fusion. Diffuse sclerosis. IMPRESSION: 1. Mild nodular airspace disease in the RIGHT lower lobe suggest mild aspiration pneumonitis or pneumonia. 2. Post cholecystectomy. No complicating features. No fluid collections or biliary obstruction. 3. 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 Electronically Signed   By: Jackquline Boxer M.D.   On: 11/26/2023 19:03     Medications: Scheduled Meds:  folic acid   1 mg Oral Daily   heparin   5,000 Units Subcutaneous Q8H   HYDROmorphone    Intravenous Q4H   hydroxyurea   1,500 mg Oral Daily   multivitamin with minerals  1 tablet Oral Daily   senna-docusate  1 tablet Oral BID    Continuous Infusions:  ampicillin-sulbactam (UNASYN) IV 3 g (11/28/23 0511)    PRN Meds:.acetaminophen  **OR** acetaminophen , albuterol, diphenhydrAMINE , ketorolac , naloxone  **AND** sodium chloride  flush, ondansetron  **OR** ondansetron  (ZOFRAN ) IV, polyethylene glycol, sodium chloride  flush  Consultants: Surgical Consult (completed)  Procedures:  day 2 S/P laparoscopic cholecystectomy  Antibiotics: None  Assessment/Plan: Principal Problem:   Sickle cell pain crisis (HCC) Active Problems:   Leukocytosis   Chronic pain syndrome   Abdominal pain   Cholelithiasis with chronic cholecystitis: S/P Laparoscopic Cholecystectomy with near-infrared fluorescent cholangiography Procedure completed on 11/22/2023. Doing well post op, patient tolerated procedure well without complication. He is okay to discharge per surgical team. Intractable Nausea & Vomiting: Vomiting revolved, mild Nausea improving, continue Zofran .   Hb Sickle Cell Disease with Pain crisis: Continue IVF 0.45% Saline KVO, continue weight based Dilaudid  PCA, IV Toradol  15 mg Q 6 H for a total of 5 days,  continue oral home pain medications as ordered. Monitor vitals very closely, Re-evaluate pain scale regularly, 2 L of Oxygen by [place]. Patient encouraged to ambulate on the hallway today.  Constipation: increase hydration, lactulose  30 mg p.o. Anemia of Chronic Disease: slightly lower than patients baseline. No clinical indication for  transfusion. Will continue to monitor daily CBC Chronic pain Syndrome: Continue oral pain medication   Code Status: Full Code Family Communication: N/A Disposition Plan: ready for discharge in the a.m.  [name] Cover NP   If 7PM-7AM, please contact night-coverage.  11/28/2023, 10:48 AM  LOS: 2 days

## 2023-11-28 NOTE — Progress Notes (Incomplete)
 Patient ID: Manuel Mcguire, male   DOB: 1991/02/13, 33 y.o.   MRN: [id number] Subjective: Manuel Mcguire is a 33 year old male with a medical history significant for sickle cell disease was admitted for nausea and vomiting in the setting of sickle cell pain crisis. Patient is status post lap cholecystectomy on 11/22/2023.   Patient reports pain of 7/10 today with abdominal discomfort, and unresolved nausea.  He also complains of fatigue.  He denies hematemesis or hemoptysis.  No urinary symptoms including dysuria or hematuria.  No constipation or diarrhea.  Patient currently afebrile.    Objective:  Vital signs in last 24 hours:  Vitals:   11/28/23 0424 11/28/23 0507 11/28/23 0741 11/28/23 1040  BP: 124/77   120/88  Pulse: 70   65  Resp: 14 14 15 16   Temp: 98.8 F (37.1 C)   98.9 F (37.2 C)  TempSrc: Oral   Oral  SpO2: 96%   98%  Weight:      Height:        Intake/Output from previous day:   Intake/Output Summary (Last 24 hours) at 11/28/2023 1048 Last data filed at 11/28/2023 0745 Gross per 24 hour  Intake 3376.36 ml  Output 1400 ml  Net 1976.36 ml     Physical Exam: General: Alert, awake, oriented x3, in no acute distress.  HEENT: [place]/AT [place], EOMI Neck: Trachea midline,  no masses, no thyromegal,y no JVD, no carotid bruit OROPHARYNX:  Moist, No exudate/ erythema/lesions.  Heart: Regular rate and rhythm, without murmurs, rubs, gallops, PMI non-displaced, no heaves or thrills on palpation.  Lungs: Clear to auscultation, no wheezing or rhonchi noted. No increased vocal fremitus resonant to percussion  Abdomen: Tenderness, 4 laparoscopy sight  Neuro: No focal neurological deficits noted cranial nerves II through XII grossly intact. DTRs 2+ bilaterally upper and lower extremities. Strength 5 out of 5 in bilateral upper and lower extremities. Musculoskeletal: generalize body tenderness. Psychiatric: Patient alert and oriented x3, good insight and cognition, good recent to  remote recall. Lymph node survey: No cervical axillary or inguinal lymphadenopathy noted.  Lab Results:  Basic Metabolic Panel:    Component Value Date/Time   NA 132 (L) 11/27/2023 1520   K 3.2 (L) 11/27/2023 1520   CL 102 11/27/2023 1520   CO2 24 11/27/2023 1520   BUN 10 11/27/2023 1520   CREATININE 0.99 11/27/2023 1520   GLUCOSE 133 (H) 11/27/2023 1520   CALCIUM 8.1 (L) 11/27/2023 1520   CBC:    Component Value Date/Time   WBC 11.6 (H) 11/27/2023 1520   HGB 7.3 (L) 11/27/2023 1520   HCT 20.5 (L) 11/27/2023 1520   PLT 399 11/27/2023 1520   MCV 106.2 (H) 11/27/2023 1520   NEUTROABS 15.8 (H) 11/26/2023 1915   LYMPHSABS 1.8 11/26/2023 1915   MONOABS 2.1 (H) 11/26/2023 1915   EOSABS 0.1 11/26/2023 1915   BASOSABS 0.0 11/26/2023 1915    Recent Results (from the past 240 hours)  Surgical PCR screen     Status: Abnormal   Collection Time: 11/22/23  7:36 AM   Specimen: Nasal Mucosa; Nasal Swab  Result Value Ref Range Status   MRSA, PCR NEGATIVE NEGATIVE Final   Staphylococcus aureus POSITIVE (A) NEGATIVE Final    Comment: (NOTE) The Xpert SA Assay (FDA approved for NASAL specimens in patients 78 years of age and older), is one component of a comprehensive surveillance program. It is not intended to diagnose infection nor to guide or monitor treatment. Performed at [facility], [address], [place], [place] [address]     Studies/Results: MR ABDOMEN MRCP W WO CONTAST Result Date: 11/27/2023 CLINICAL DATA:  Jaundice.  Status post cholecystectomy EXAM: MRI ABDOMEN WITHOUT AND WITH CONTRAST (INCLUDING MRCP) TECHNIQUE: Multiplanar multisequence MR imaging of the abdomen was performed both before and after the administration of intravenous contrast. Heavily T2-weighted images of the biliary and pancreatic ducts were obtained, and three-dimensional MRCP images were rendered by post processing. CONTRAST:  7.32mL GADAVIST GADOBUTROL 1 MMOL/ML IV SOLN  COMPARISON:  CT AP 11/26/2023 FINDINGS: Lower chest: No acute abnormality.  Heart size appears enlarged. Hepatobiliary: No suspicious liver lesion. Status post cholecystectomy. Increase caliber of the common bile duct measures 0.9 cm. The MRCP images are obscured by motion artifact. On the coronal T2 weighted sequences there is suggestion of 2 small stones within the distal CBD which measure up to 3 mm, image 14/3. This cannot be confirmed on either the MRCP images or the axial T2 weighted sequences. Pancreas: No mass, inflammatory changes, or other parenchymal abnormality identified. Spleen:  Atrophic and calcified spleen. Adrenals/Urinary Tract: Signal void artifact from spinal hardware no signs of hydronephrosis. Suggestion of indeterminate upper pole lesion off the lateral cortex of the right kidney measuring 1.7 cm, image 27/16. This appears T1 isointense, limits assessment of the adrenal glands. Cannot assess for underlying enhancement due to artifact created by spinal hardware. No additional focal kidney abnormality noted. Stomach/Bowel: Stomach appears nondistended. No dilated loops of bowel noted. Vascular/Lymphatic: No pathologically enlarged lymph nodes identified. No abdominal aortic aneurysm demonstrated. Other:  No free fluid or fluid collections. Musculoskeletal: No suspicious bone lesions identified. IMPRESSION: 1. Status post cholecystectomy. Increase caliber of the common bile duct measures 0.9 cm. The MRCP images are obscured by motion artifact. On the coronal T2 weighted sequences there is suggestion of 2 small stones within the distal CBD which measure up to 3 mm. This cannot be confirmed on either the MRCP images or the axial T2 weighted sequences. Consider further evaluation with ERCP. 2. Suggestion of indeterminate upper pole lesion off the lateral cortex of the right kidney measuring 1.7 cm. This appears T1 isointense, limits assessment of the adrenal glands. Cannot assess for underlying  enhancement due to artifact created by spinal hardware. Consider further evaluation with renal sonogram. Electronically Signed   By: Waddell Calk M.D.   On: 11/27/2023 13:32   DG Chest 2 View Result Date: 11/26/2023 CLINICAL DATA:  Sickle cell.  Acute chest pain. EXAM: CHEST - 2 VIEW COMPARISON:  Chest x-ray 06/04/2023. CT abdomen and pelvis same day. FINDINGS: The heart is enlarged, unchanged. There is no focal lung infiltrate, pleural effusion or pneumothorax. No acute fractures are seen. Extensive thoracic spinal hardware present. IMPRESSION: Cardiomegaly. No acute cardiopulmonary process. Electronically Signed   By: Greig Pique M.D.   On: 11/26/2023 20:27   CT ABDOMEN PELVIS W CONTRAST Result Date: 11/26/2023 CLINICAL DATA:  Abdominal pain. Recent gallbladder surgery discharge 1 day prior. EXAM: CT ABDOMEN AND PELVIS WITH CONTRAST TECHNIQUE: Multidetector CT imaging of the abdomen and pelvis was performed using the standard protocol following bolus administration of intravenous contrast. RADIATION DOSE REDUCTION: This exam was performed according to the departmental dose-optimization program which includes automated exposure control, adjustment of the mA and/or kV according to patient size and/or use of iterative reconstruction technique. CONTRAST:  OMNIPAQUE  IOHEXOL  300 MG/ML  SOLN COMPARISON:  CT 11/16/2023 FINDINGS: Lower chest: Mild nodular airspace disease in the lateral aspect the RIGHT lower  lobe (image 10/series 6. Hepatobiliary: No focal hepatic lesion. Postcholecystectomy. No biliary dilatation. No abnormal fluid collections in the gallbladder fossa. No intrahepatic or extra hepatic biliary duct dilatation. Pancreas: Pancreas is normal. No ductal dilatation. No pancreatic inflammation. Spleen: Hyperdense small spleen suggest also infarction Adrenals/urinary tract: Adrenal glands and kidneys are normal. The ureters and bladder normal. Stomach/Bowel: Stomach, small bowel, appendix, and  cecum are normal. The colon and rectosigmoid colon are normal. Vascular/Lymphatic: Abdominal aorta is normal caliber. No periportal or retroperitoneal adenopathy. No pelvic adenopathy. Reproductive: Prostate unremarkable Other: No free air or abscess in the abdomen pelvis. Musculoskeletal: LEFT hip internal fixation. Posterior lumbar fusion. Diffuse sclerosis. IMPRESSION: 1. Mild nodular airspace disease in the RIGHT lower lobe suggest mild aspiration pneumonitis or pneumonia. 2. Post cholecystectomy. No complicating features. No fluid collections or biliary obstruction. 3. 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 Electronically Signed   By: Jackquline Boxer M.D.   On: 11/26/2023 19:03     Medications: Scheduled Meds: . folic acid   1 mg Oral Daily  . heparin   5,000 Units Subcutaneous Q8H  . HYDROmorphone    Intravenous Q4H  . hydroxyurea   1,500 mg Oral Daily  . multivitamin with minerals  1 tablet Oral Daily  . senna-docusate  1 tablet Oral BID    Continuous Infusions: . ampicillin-sulbactam (UNASYN) IV 3 g (11/28/23 0511)    PRN Meds:.acetaminophen  **OR** acetaminophen , albuterol, diphenhydrAMINE , ketorolac , naloxone  **AND** sodium chloride  flush, ondansetron  **OR** ondansetron  (ZOFRAN ) IV, polyethylene glycol, sodium chloride  flush  Consultants: Surgical Consult (completed)  Procedures:  day 2 S/P laparoscopic cholecystectomy  Antibiotics: None  Assessment/Plan: Principal Problem:   Sickle cell pain crisis (HCC) Active Problems:   Leukocytosis   Chronic pain syndrome   Abdominal pain   Cholelithiasis with chronic cholecystitis: S/P Laparoscopic Cholecystectomy with near-infrared fluorescent cholangiography Procedure completed on 11/22/2023. Doing well post op, patient tolerated procedure well without complication. He is okay to discharge per surgical team. Intractable Nausea & Vomiting: Vomiting revolved, mild Nausea improving, continue Zofran .   Hb Sickle Cell  Disease with Pain crisis: Continue IVF 0.45% Saline KVO, continue weight based Dilaudid  PCA, IV Toradol  15 mg Q 6 H for a total of 5 days, continue oral home pain medications as ordered. Monitor vitals very closely, Re-evaluate pain scale regularly, 2 L of Oxygen by [place]. Patient encouraged to ambulate on the hallway today.  Constipation: increase hydration, lactulose  30 mg p.o. Anemia of Chronic Disease: slightly lower than patients baseline. No clinical indication for  transfusion. Will continue to monitor daily CBC Chronic pain Syndrome: Continue oral pain medication   Code Status: Full Code Family Communication: N/A Disposition Plan: ready for discharge in the a.m.  [name] Cover NP   If 7PM-7AM, please contact night-coverage.  11/28/2023, 10:48 AM  LOS: 2 days

## 2023-11-29 DIAGNOSIS — D57219 Sickle-cell/Hb-C disease with crisis, unspecified: Secondary | ICD-10-CM | POA: Diagnosis not present

## 2023-11-29 DIAGNOSIS — R1084 Generalized abdominal pain: Secondary | ICD-10-CM | POA: Diagnosis not present

## 2023-11-29 LAB — COMPREHENSIVE METABOLIC PANEL WITH GFR
ALT: 42 U/L (ref 0–44)
AST: 35 U/L (ref 15–41)
Albumin: 3.2 g/dL — ABNORMAL LOW (ref 3.5–5.0)
Alkaline Phosphatase: 75 U/L (ref 38–126)
Anion gap: 7 (ref 5–15)
BUN: 10 mg/dL (ref 6–20)
CO2: 25 mmol/L (ref 22–32)
Calcium: 8.4 mg/dL — ABNORMAL LOW (ref 8.9–10.3)
Chloride: 106 mmol/L (ref 98–111)
Creatinine, Ser: 0.5 mg/dL — ABNORMAL LOW (ref 0.61–1.24)
GFR, Estimated: >60 mL/min (ref >60)
Glucose, Bld: 132 mg/dL — ABNORMAL HIGH (ref 70–99)
Potassium: 3.6 mmol/L (ref 3.5–5.1)
Sodium: 138 mmol/L (ref 135–145)
Total Bilirubin: 1.4 mg/dL — ABNORMAL HIGH (ref 0.0–1.2)
Total Protein: 6.6 g/dL (ref 6.5–8.1)

## 2023-11-29 LAB — CBC
HCT: 20.5 % — ABNORMAL LOW (ref 39.0–52.0)
Hemoglobin: 7.2 g/dL — ABNORMAL LOW (ref 13.0–17.0)
MCH: 38.3 pg — ABNORMAL HIGH (ref 26.0–34.0)
MCHC: 35.1 g/dL (ref 30.0–36.0)
MCV: 109 fL — ABNORMAL HIGH (ref 80.0–100.0)
Platelets: 475 10*3/uL — ABNORMAL HIGH (ref 150–400)
RBC: 1.88 MIL/uL — ABNORMAL LOW (ref 4.22–5.81)
RDW: 18.3 % — ABNORMAL HIGH (ref 11.5–15.5)
WBC: 12.2 10*3/uL — ABNORMAL HIGH (ref 4.0–10.5)
nRBC: 0.7 % — ABNORMAL HIGH (ref 0.0–0.2)

## 2023-11-29 NOTE — Progress Notes (Signed)
 Subjective/Chief Complaint: Patient sleeping.  Father at bedside.  No complaints.   Objective: Vital signs in last 24 hours: Temp:  [97.7 F (36.5 C)-98.9 F (37.2 C)] 98.3 F (36.8 C) (06/24 0558) Pulse Rate:  [65-78] 75 (06/24 0558) Resp:  [13-18] 15 (06/24 0727) BP: (120-135)/(80-88) 135/80 (06/24 0558) SpO2:  [94 %-98 %] 96 % (06/24 0558) Last BM Date : 11/26/23  Intake/Output from previous day: 06/23 0701 - 06/24 0700 In: 4 [I.V.:4] Out: 2525 [Urine:2525] Intake/Output this shift: No intake/output data recorded.  Abdomen soft.  Port sites clean dry intact  Lab Results:  Recent Labs    11/27/23 1520 11/29/23 0500  WBC 11.6* 12.2*  HGB 7.3* 7.2*  HCT 20.5* 20.5*  PLT 399 475*   BMET Recent Labs    11/27/23 1520 11/29/23 0500  NA 132* 138  K 3.2* 3.6  CL 102 106  CO2 24 25  GLUCOSE 133* 132*  BUN 10 10  CREATININE 0.99 0.50*  CALCIUM 8.1* 8.4*   PT/INR No results for input(s): LABPROT, INR in the last 72 hours. ABG No results for input(s): PHART, HCO3 in the last 72 hours.  Invalid input(s): PCO2, PO2  Studies/Results: MR ABDOMEN MRCP W WO CONTAST Result Date: 11/27/2023 CLINICAL DATA:  Jaundice.  Status post cholecystectomy EXAM: MRI ABDOMEN WITHOUT AND WITH CONTRAST (INCLUDING MRCP) TECHNIQUE: Multiplanar multisequence MR imaging of the abdomen was performed both before and after the administration of intravenous contrast. Heavily T2-weighted images of the biliary and pancreatic ducts were obtained, and three-dimensional MRCP images were rendered by post processing. CONTRAST:  7.36mL GADAVIST GADOBUTROL 1 MMOL/ML IV SOLN COMPARISON:  CT AP 11/26/2023 FINDINGS: Lower chest: No acute abnormality.  Heart size appears enlarged. Hepatobiliary: No suspicious liver lesion. Status post cholecystectomy. Increase caliber of the common bile duct measures 0.9 cm. The MRCP images are obscured by motion artifact. On the coronal T2 weighted sequences  there is suggestion of 2 small stones within the distal CBD which measure up to 3 mm, image 14/3. This cannot be confirmed on either the MRCP images or the axial T2 weighted sequences. Pancreas: No mass, inflammatory changes, or other parenchymal abnormality identified. Spleen:  Atrophic and calcified spleen. Adrenals/Urinary Tract: Signal void artifact from spinal hardware no signs of hydronephrosis. Suggestion of indeterminate upper pole lesion off the lateral cortex of the right kidney measuring 1.7 cm, image 27/16. This appears T1 isointense, limits assessment of the adrenal glands. Cannot assess for underlying enhancement due to artifact created by spinal hardware. No additional focal kidney abnormality noted. Stomach/Bowel: Stomach appears nondistended. No dilated loops of bowel noted. Vascular/Lymphatic: No pathologically enlarged lymph nodes identified. No abdominal aortic aneurysm demonstrated. Other:  No free fluid or fluid collections. Musculoskeletal: No suspicious bone lesions identified. IMPRESSION: 1. Status post cholecystectomy. Increase caliber of the common bile duct measures 0.9 cm. The MRCP images are obscured by motion artifact. On the coronal T2 weighted sequences there is suggestion of 2 small stones within the distal CBD which measure up to 3 mm. This cannot be confirmed on either the MRCP images or the axial T2 weighted sequences. Consider further evaluation with ERCP. 2. Suggestion of indeterminate upper pole lesion off the lateral cortex of the right kidney measuring 1.7 cm. This appears T1 isointense, limits assessment of the adrenal glands. Cannot assess for underlying enhancement due to artifact created by spinal hardware. Consider further evaluation with renal sonogram. Electronically Signed   By: Waddell Calk M.D.   On: 11/27/2023 13:32  Anti-infectives: Anti-infectives (From admission, onward)    Start     Dose/Rate Route Frequency Ordered Stop   11/27/23 1200   Ampicillin-Sulbactam (UNASYN) 3 g in sodium chloride  0.9 % 100 mL IVPB        3 g 200 mL/hr over 30 Minutes Intravenous Every 6 hours 11/27/23 1030     11/26/23 2200  metroNIDAZOLE  (FLAGYL ) IVPB 500 mg        500 mg 100 mL/hr over 60 Minutes Intravenous Every 12 hours 11/26/23 2114 11/27/23 1121   11/26/23 2045  levofloxacin  (LEVAQUIN ) IVPB 750 mg        750 mg 100 mL/hr over 90 Minutes Intravenous  Once 11/26/23 2039 11/26/23 2309       Assessment/Plan: s/p lap chole postop day 7  Question CBD stone on MRI  LFTs otherwise relatively stable  Recommend GI input.  Stable surgically.  Will sign off.   LOS: 3 days    Debby LABOR Tishara Pizano 11/29/2023

## 2023-11-29 NOTE — Plan of Care (Signed)

## 2023-11-29 NOTE — Progress Notes (Cosign Needed)
 Patient ID: Manuel Mcguire, male   DOB: [birth year], 33 y.o.   MRN: [id number] Subjective: Manuel Mcguire is a 33 year old male with a medical history significant for sickle cell disease was admitted for sickle cell pain crisis also having abdominal pain with nausea with vomiting.   Patient reports pain of 7/10 today ongoing nausea. He denies vomiting, fever, headache, SOB, cough. No urinary symptoms.    Objective:  Vital signs in last 24 hours:  Vitals:   12/01/23 0806 12/01/23 1024 12/01/23 1246 12/01/23 1340  BP:  129/77  122/82  Pulse:  70  70  Resp: 17 18 12 20   Temp:  98.4 F (36.9 C)  98.6 F (37 C)  TempSrc:  Oral  Oral  SpO2: 100% 98% 99% 96%  Weight:      Height:        Intake/Output from previous day:   Intake/Output Summary (Last 24 hours) at 12/01/2023 1613 Last data filed at 12/01/2023 1246 Gross per 24 hour  Intake 798.79 ml  Output 3025 ml  Net -2226.21 ml     Physical Exam: General: Alert, awake, oriented x3, in no acute distress.  HEENT: [place]/AT PEERL, EOMI Neck: Trachea midline,  no masses, no thyromegal,y no JVD, no carotid bruit OROPHARYNX:  Moist, No exudate/ erythema/lesions.  Heart: Regular rate and rhythm, without murmurs, rubs, gallops, PMI non-displaced, no heaves or thrills on palpation.  Lungs: Clear to auscultation, no wheezing or rhonchi noted. No increased vocal fremitus resonant to percussion  Abdomen: Tenderness, 4 laparoscopy sight  Neuro: No focal neurological deficits noted cranial nerves II through XII grossly intact. DTRs 2+ bilaterally upper and lower extremities. Strength 5 out of 5 in bilateral upper and lower extremities. Musculoskeletal: generalize body tenderness. Psychiatric: Patient alert and oriented x3, good insight and cognition, good recent to remote recall. Lymph node survey: No cervical axillary or inguinal lymphadenopathy noted.  Lab Results:  Basic Metabolic Panel:    Component Value Date/Time   NA 138 11/29/2023  0500   K 3.6 11/29/2023 0500   CL 106 11/29/2023 0500   CO2 25 11/29/2023 0500   BUN 10 11/29/2023 0500   CREATININE 0.50 (L) 11/29/2023 0500   GLUCOSE 132 (H) 11/29/2023 0500   CALCIUM 8.4 (L) 11/29/2023 0500   CBC:    Component Value Date/Time   WBC 12.0 (H) 12/01/2023 0529   HGB 7.1 (L) 12/01/2023 0529   HCT 19.6 (L) 12/01/2023 0529   PLT 537 (H) 12/01/2023 0529   MCV 108.3 (H) 12/01/2023 0529   NEUTROABS 15.8 (H) 11/26/2023 1915   LYMPHSABS 1.8 11/26/2023 1915   MONOABS 2.1 (H) 11/26/2023 1915   EOSABS 0.1 11/26/2023 1915   BASOSABS 0.0 11/26/2023 1915    Recent Results (from the past 240 hours)  Surgical PCR screen     Status: Abnormal   Collection Time: 11/22/23  7:36 AM   Specimen: Nasal Mucosa; Nasal Swab  Result Value Ref Range Status   MRSA, PCR NEGATIVE NEGATIVE Final   Staphylococcus aureus POSITIVE (A) NEGATIVE Final    Comment: (NOTE) The Xpert SA Assay (FDA approved for NASAL specimens in patients 78 years of age and older), is one component of a comprehensive surveillance program. It is not intended to diagnose infection nor to guide or monitor treatment. Performed at [facility], [address], [place], [place] [address]   Urine Culture (for pregnant, neutropenic or urologic patients or patients with an indwelling urinary catheter)  Status: None   Collection Time: 11/26/23  6:08 PM   Specimen: Urine, Clean Catch  Result Value Ref Range Status   Specimen Description   Final    URINE, CLEAN CATCH Performed at [facility], [address] [place], [place] [address]    Special Requests   Final    NONE Performed at [facility], [address], [place], [place] [address]    Culture   Final    NO GROWTH Performed at [facility], [address] [place], [place] [address]    Report Status 11/28/2023 FINAL  Final    Studies/Results: No results found.   Medications: Scheduled  Meds:  sodium chloride    Intravenous Once   acetaminophen   650 mg Oral Once   diphenhydrAMINE   25 mg Oral Once   folic acid   1 mg Oral Daily   heparin   5,000 Units Subcutaneous Q8H   HYDROmorphone    Intravenous Q4H   hydroxyurea   1,500 mg Oral Daily   multivitamin with minerals  1 tablet Oral Daily   senna-docusate  1 tablet Oral BID    Continuous Infusions:  ampicillin-sulbactam (UNASYN) IV 3 g (12/01/23 1248)    PRN Meds:.acetaminophen  **OR** acetaminophen , albuterol, diphenhydrAMINE , ketorolac , naloxone  **AND** sodium chloride  flush, ondansetron  **OR** ondansetron  (ZOFRAN ) IV, mouth rinse, polyethylene glycol, sodium chloride  flush  Consultants: GI consult  Procedures:  S/P laparoscopic cholecystectomy  Antibiotics: None  Assessment/Plan: Principal Problem:   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 He is okay to discharge per surgical team and follow up out patient. Intractable Nausea & Vomiting: MR abdomen MRCP completed: Increase caliber of the common bile duct measures 0.9 cm. The MRCP images are obscured by motion artifact. On the coronal T2 weighted sequences there is suggestion of 2 small stones within the distal CBD which measure up to 3 mm. This cannot be confirmed on either the MRCP images or the axial T2 weighted sequences. Consider further evaluation with ERCP.  Suggestion of indeterminate upper pole lesion off the lateral cortex of the right kidney measuring 1.7 cm. This appears T1 isointense, limits assessment of the adrenal glands. Cannot assess for underlying  enhancement due to artifact created by spinal hardware. Consider further evaluation with renal sonogram. CT abdomen: Mild nodular airspace disease in the RIGHT lower lobe suggest mild aspiration pneumonitis or pneumonia. Post cholecystectomy. No complicating features. No fluid collections or biliary obstruction. 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 Ongoing mild nausea, continue Zofran .  Hb Sickle Cell Disease with Pain crisis: Continue IVF 0.45% Saline KVO, continue weight based Dilaudid  PCA, IV Toradol  15 mg Q 6 H for a total of 5 days, continue oral home pain medications as ordered. Monitor vitals very closely, Re-evaluate pain scale regularly, 2 L of Oxygen by [place]. Patient encouraged to ambulate on the hallway today.  Constipation: Resolved Anemia of Chronic Disease: slightly lower than patients baseline. No clinical indication for  transfusion. Will continue to monitor daily CBC Chronic pain Syndrome: Continue oral pain medication    Code Status: Full Code Family Communication: N/A Disposition Plan: ready for discharge in the a.m.    [name] Cover NP   If 7PM-7AM, please contact  night-coverage.  12/01/2023, 4:13 PM  LOS: 5 days

## 2023-11-29 NOTE — Progress Notes (Signed)
   11/29/23 0940  TOC Brief Assessment  Insurance and Status Reviewed  Patient has primary care physician Yes  Home environment has been reviewed home alone  Prior level of function: independent  Prior/Current Home Services No current home services  Social Drivers of Health Review SDOH reviewed no interventions necessary  Readmission risk has been reviewed Yes  Transition of care needs no transition of care needs at this time

## 2023-11-29 NOTE — Consult Note (Signed)
 Referring Provider: Dr. Jegede Primary Care Physician:  Pcp, No Primary Gastroenterologist:  Sampson  Reason for Consultation:  Abdominal pain  HPI: Manuel Mcguire is a 33 y.o. male s/p lap chole last week who was readmitted the following day with N/V and RUQ and epigastric pain in the setting of known sickle cell anemia. Managed for sickle cell disease with pain crisis . MRCP on 6/22 showed question of 2 small distal CBD stones with CBD of 9 mm. No pancreatic inflammation. TB 1.4, ALP 75, AST 35, ALT 42 (11/26/23: TB 2.7, ALP 118, AST 90, ALT 116). Abdominal pain currently mild. Father at bedside.   Past Medical History:  Diagnosis Date   Scoliosis    Sickle cell anemia with pain (HCC)     Past Surgical History:  Procedure Laterality Date   BACK SURGERY     unaware of particular surgery   CHOLECYSTECTOMY N/A 11/22/2023   Procedure: LAPAROSCOPIC CHOLECYSTECTOMY WITH INTRAOPERATIVE CHOLANGIOGRAM;  Surgeon: Signe Mitzie LABOR, MD;  Location: [facility];  Service: General;  Laterality: N/A;  Poss IOC, ICG   HIP PINNING Left    Pt unaware of particulars    Prior to Admission medications   Medication Sig Start Date End Date Taking? Authorizing Provider  cholecalciferol  (VITAMIN D3) 25 MCG (1000 UNIT) tablet Take 1,000 Units by mouth daily.   Yes [provider]  folic acid  (FOLVITE ) 1 MG tablet Take 1 tablet (1 mg total) by mouth daily. 10/27/12  Yes Alvia Rosaline LABOR, MD  hydroxyurea  (HYDREA ) 500 MG capsule Take 3 capsules (1,500 mg total) by mouth daily. May take with food to minimize GI side effects. 10/27/12  Yes Alvia Rosaline LABOR, MD  lisinopril  (ZESTRIL ) 2.5 MG tablet Take 2.5 mg by mouth daily.   Yes [provider]  ondansetron  (ZOFRAN -ODT) 4 MG disintegrating tablet 4mg  ODT q4 hours prn nausea/vomit 11/06/23  Yes Patt Alm Macho, MD  oxyCODONE  (OXY IR/ROXICODONE ) 5 MG immediate release tablet Take 5 mg by mouth every 6 (six) hours as needed for severe pain.    Yes [provider]    Scheduled Meds:  folic acid   1 mg Oral Daily   heparin   5,000 Units Subcutaneous Q8H   HYDROmorphone    Intravenous Q4H   hydroxyurea   1,500 mg Oral Daily   multivitamin with minerals  1 tablet Oral Daily   senna-docusate  1 tablet Oral BID   Continuous Infusions:  ampicillin-sulbactam (UNASYN) IV 3 g (11/29/23 1151)   PRN Meds:.acetaminophen  **OR** acetaminophen , albuterol, diphenhydrAMINE , ketorolac , naloxone  **AND** sodium chloride  flush, ondansetron  **OR** ondansetron  (ZOFRAN ) IV, polyethylene glycol, sodium chloride  flush  Allergies as of 11/26/2023   (No Known Allergies)    Family History  Problem Relation Age of Onset   Diabetes Mother    Hypertension Mother    Sickle cell anemia Sister     Social History   Socioeconomic History   Marital status: Single    Spouse name: Not on file   Number of children: Not on file   Years of education: Not on file   Highest education level: Not on file  Occupational History   Not on file  Tobacco Use   Smoking status: Never   Smokeless tobacco: Not on file  Substance and Sexual Activity   Alcohol use: No   Drug use: No   Sexual activity: Yes  Other Topics Concern   Not on file  Social History Narrative   Not on file   Social Drivers of Health  Financial Resource Strain: Not on file  Food Insecurity: No Food Insecurity (11/27/2023)   Hunger Vital Sign    Worried About Running Out of Food in the Last Year: Never true    Ran Out of Food in the Last Year: Never true  Transportation Needs: No Transportation Needs (11/27/2023)   PRAPARE - Administrator, Civil Service (Medical): No    Lack of Transportation (Non-Medical): No  Physical Activity: Not on file  Stress: Not on file  Social Connections: Unknown (11/27/2023)   Social Connection and Isolation Panel    Frequency of Communication with Friends and Family: More than three times a week    Frequency of Social Gatherings with  Friends and Family: Once a week    Attends Religious Services: More than 4 times per year    Active Member of [employer] or Organizations: No    Attends Engineer, structural: Not on file    Marital Status: Not on file  Recent Concern: Social Connections - Moderately Isolated (11/20/2023)   Social Connection and Isolation Panel    Frequency of Communication with Friends and Family: More than three times a week    Frequency of Social Gatherings with Friends and Family: Once a week    Attends Religious Services: More than 4 times per year    Active Member of [employer] or Organizations: No    Attends Engineer, structural: Not on file    Marital Status: Never married  Intimate Partner Violence: Not At Risk (11/27/2023)   Humiliation, Afraid, Rape, and Kick questionnaire    Fear of Current or Ex-Partner: No    Emotionally Abused: No    Physically Abused: No    Sexually Abused: No    Review of Systems: All negative except as stated above in HPI.  Physical Exam: Vital signs: Vitals:   11/29/23 1031 11/29/23 1151  BP: 112/80   Pulse: 67   Resp: 18 14  Temp: 98.4 F (36.9 C)   SpO2: 97%    Last BM Date : 11/26/23 General:   Alert,  Well-developed, well-nourished, pleasant and cooperative in NAD Head: normocephalic, atraumatic Eyes: anicteric sclera ENT: oropharynx clear Neck: supple, nontender Lungs:  Clear throughout to auscultation.   No wheezes, crackles, or rhonchi. No acute distress. Heart:  Regular rate and rhythm; no murmurs, clicks, rubs,  or gallops. Abdomen: RUQ and epigastric tenderness with guarding, soft, nondistended, +BS  Rectal:  Deferred Ext: no edema  GI:  Lab Results: Recent Labs    11/26/23 1915 11/27/23 1520 11/29/23 0500  WBC 19.9* 11.6* 12.2*  HGB 8.5* 7.3* 7.2*  HCT 24.0* 20.5* 20.5*  PLT 354 399 475*   BMET Recent Labs    11/26/23 1605 11/27/23 1520 11/29/23 0500  NA 138 132* 138  K 3.7 3.2* 3.6  CL 106 102 106  CO2 22 24 25    GLUCOSE 119* 133* 132*  BUN 11 10 10   CREATININE 0.83 0.99 0.50*  CALCIUM 9.9 8.1* 8.4*   LFT Recent Labs    11/28/23 1026 11/29/23 0500  PROT 6.9 6.6  ALBUMIN 3.4* 3.2*  AST 31 35  ALT 50* 42  ALKPHOS 74 75  BILITOT 1.2 1.4*  BILIDIR 0.3*  --   IBILI 0.9  --    PT/INR No results for input(s): LABPROT, INR in the last 72 hours.   Studies/Results: No results found.  Impression/Plan: Abdominal pain in the setting of recent lap chole and question of choledocholithiasis on MRCP  6/22. LFTs are normal. I do not think he has choledocholithiasis and if he does the stones are nonobstructing and do not warrant an ERCP or EUS as an inpt. Likely will pass spontaneously if truly a stone. Would not recommend an EUS or ERCP at this time and f/u as outpt and determine at that will further f/u of LFTs whether additional imaging necessary vs repeat MRCP. Please have pt f/u with [facility] in 4-6 weeks. Defer to primary team when to discharge. Will sign off. Call if questions.    LOS: 3 days   [name]  11/29/2023, 2:03 PM  Questions please call [phone]

## 2023-11-29 NOTE — Discharge Instructions (Signed)
 [facility] ______[facility] LAPAROSCOPIC SURGERY: POST OP INSTRUCTIONS Always review your discharge instruction sheet given to you by the facility where your surgery was performed. IF YOU HAVE DISABILITY OR FAMILY LEAVE FORMS, YOU MUST BRING THEM TO THE OFFICE FOR PROCESSING.   DO NOT GIVE THEM TO YOUR DOCTOR.  A prescription for pain medication may be given to you upon discharge.  Take your pain medication as prescribed, if needed.  If narcotic pain medicine is not needed, then you may take acetaminophen (Tylenol) or ibuprofen (Advil) as needed. Take your usually prescribed medications unless otherwise directed. If you need a refill on your pain medication, please contact your pharmacy.  They will contact our office to request authorization. Prescriptions will not be filled after 5pm or on week-ends. You should follow a light diet the first few days after arrival home, such as soup and crackers, etc.  Be sure to include lots of fluids daily. Most patients will experience some swelling and bruising in the area of the incisions.  Ice packs will help.  Swelling and bruising can take several days to resolve.  It is common to experience some constipation if taking pain medication after surgery.  Increasing fluid intake and taking a stool softener (such as Colace) will usually help or prevent this problem from occurring.  A mild laxative (Milk of Magnesia or Miralax) should be taken according to package instructions if there are no bowel movements after 48 hours. Unless discharge instructions indicate otherwise, you may remove your bandages 24-48 hours after surgery, and you may shower at that time.  You may have steri-strips (small skin tapes) in place directly over the incision.  These strips should be left on the skin for 7-10 days.  If your surgeon used skin glue on the incision, you may shower in 24 hours.  The glue will flake off over the next 2-3 weeks.  Any sutures or staples will be  removed at the office during your follow-up visit. ACTIVITIES:  You may resume regular (light) daily activities beginning the next day--such as daily self-care, walking, climbing stairs--gradually increasing activities as tolerated.  You may have sexual intercourse when it is comfortable.  Refrain from any heavy lifting or straining until approved by your doctor. You may drive when you are no longer taking prescription pain medication, you can comfortably wear a seatbelt, and you can safely maneuver your car and apply brakes. RETURN TO WORK:  __________________________________________________________ Manuel Mcguire should see your doctor in the office for a follow-up appointment approximately 2-3 weeks after your surgery.  Make sure that you call for this appointment within a day or two after you arrive home to insure a convenient appointment time. OTHER INSTRUCTIONS: __________________________________________________________________________________________________________________________ __________________________________________________________________________________________________________________________ WHEN TO CALL YOUR DOCTOR: Fever over 101.0 Inability to urinate Continued bleeding from incision. Increased pain, redness, or drainage from the incision. Increasing abdominal pain  The clinic staff is available to answer your questions during regular business hours.  Please don't hesitate to call and ask to speak to one of the nurses for clinical concerns.  If you have a medical emergency, go to the nearest emergency room or call 911.  A surgeon from [facility] is always on call at the hospital. [address], [address], [place], [place]  [address] ? [address] [address], [place], [place]   [address] [phone] ? [phone] ? FAX [phone] Web site: [email or web address]

## 2023-11-29 NOTE — Plan of Care (Signed)
  Problem: Education: Goal: Awareness of infection prevention will improve Outcome: Progressing Goal: Awareness of signs and symptoms of anemia will improve Outcome: Progressing   Problem: Tissue Perfusion: Goal: Complications related to inadequate tissue perfusion will be avoided or minimized Outcome: Progressing   Problem: Respiratory: Goal: Pulmonary complications will be avoided or minimized Outcome: Progressing Goal: Acute Chest Syndrome will be identified early to prevent complications Outcome: Progressing

## 2023-11-30 DIAGNOSIS — K8064 Calculus of gallbladder and bile duct with chronic cholecystitis without obstruction: Secondary | ICD-10-CM | POA: Diagnosis present

## 2023-11-30 LAB — CBC
HCT: 20.6 % — ABNORMAL LOW (ref 39.0–52.0)
Hemoglobin: 7.3 g/dL — ABNORMAL LOW (ref 13.0–17.0)
MCH: 38 pg — ABNORMAL HIGH (ref 26.0–34.0)
MCHC: 35.4 g/dL (ref 30.0–36.0)
MCV: 107.3 fL — ABNORMAL HIGH (ref 80.0–100.0)
Platelets: 519 10*3/uL — ABNORMAL HIGH (ref 150–400)
RBC: 1.92 MIL/uL — ABNORMAL LOW (ref 4.22–5.81)
RDW: 18.3 % — ABNORMAL HIGH (ref 11.5–15.5)
WBC: 9.9 10*3/uL (ref 4.0–10.5)
nRBC: 0.6 % — ABNORMAL HIGH (ref 0.0–0.2)

## 2023-11-30 MED ORDER — SODIUM CHLORIDE 0.9% IV SOLUTION: Freq: Once | INTRAVENOUS | Status: DC

## 2023-11-30 MED ORDER — DIPHENHYDRAMINE HCL 25 MG PO CAPS: 25.0000 mg | ORAL_CAPSULE | Freq: Once | ORAL | Status: DC

## 2023-11-30 MED ORDER — ORAL CARE MOUTH RINSE: 15.0000 mL | OROMUCOSAL | Status: DC | PRN

## 2023-11-30 MED ORDER — ACETAMINOPHEN 325 MG PO TABS: 650.0000 mg | ORAL_TABLET | Freq: Once | ORAL | Status: DC

## 2023-11-30 NOTE — Progress Notes (Signed)
 Patient ID: Manuel Mcguire, male   DOB: 07-25-1990, 33 y.o.   MRN: [id number] Subjective: Manuel Mcguire is a 33 year old male with a medical history significant for sickle cell disease was admitted for sickle cell pain crisis also having abdominal pain with nausea with vomiting.   Patient reports pain of 7/10 today ongoing nausea. He denies vomiting, fever, headache, SOB, cough. No urinary symptoms.    Objective:  Vital signs in last 24 hours:  Vitals:   12/01/23 0806 12/01/23 1024 12/01/23 1246 12/01/23 1340  BP:  129/77  122/82  Pulse:  70  70  Resp: 17 18 12 20   Temp:  98.4 F (36.9 C)  98.6 F (37 C)  TempSrc:  Oral  Oral  SpO2: 100% 98% 99% 96%  Weight:      Height:        Intake/Output from previous day:   Intake/Output Summary (Last 24 hours) at 12/01/2023 1615 Last data filed at 12/01/2023 1246 Gross per 24 hour  Intake 798.79 ml  Output 3025 ml  Net -2226.21 ml     Physical Exam: General: Alert, awake, oriented x3, in no acute distress.  HEENT: [place]/AT PEERL, EOMI Neck: Trachea midline,  no masses, no thyromegal,y no JVD, no carotid bruit OROPHARYNX:  Moist, No exudate/ erythema/lesions.  Heart: Regular rate and rhythm, without murmurs, rubs, gallops, PMI non-displaced, no heaves or thrills on palpation.  Lungs: Clear to auscultation, no wheezing or rhonchi noted. No increased vocal fremitus resonant to percussion  Abdomen: Tenderness, 4 laparoscopy sight  Neuro: No focal neurological deficits noted cranial nerves II through XII grossly intact. DTRs 2+ bilaterally upper and lower extremities. Strength 5 out of 5 in bilateral upper and lower extremities. Musculoskeletal: generalize body tenderness. Psychiatric: Patient alert and oriented x3, good insight and cognition, good recent to remote recall. Lymph node survey: No cervical axillary or inguinal lymphadenopathy noted.  Lab Results:  Basic Metabolic Panel:    Component Value Date/Time   NA 138 11/29/2023  0500   K 3.6 11/29/2023 0500   CL 106 11/29/2023 0500   CO2 25 11/29/2023 0500   BUN 10 11/29/2023 0500   CREATININE 0.50 (L) 11/29/2023 0500   GLUCOSE 132 (H) 11/29/2023 0500   CALCIUM 8.4 (L) 11/29/2023 0500   CBC:    Component Value Date/Time   WBC 12.0 (H) 12/01/2023 0529   HGB 7.1 (L) 12/01/2023 0529   HCT 19.6 (L) 12/01/2023 0529   PLT 537 (H) 12/01/2023 0529   MCV 108.3 (H) 12/01/2023 0529   NEUTROABS 15.8 (H) 11/26/2023 1915   LYMPHSABS 1.8 11/26/2023 1915   MONOABS 2.1 (H) 11/26/2023 1915   EOSABS 0.1 11/26/2023 1915   BASOSABS 0.0 11/26/2023 1915    Recent Results (from the past 240 hours)  Surgical PCR screen     Status: Abnormal   Collection Time: 11/22/23  7:36 AM   Specimen: Nasal Mucosa; Nasal Swab  Result Value Ref Range Status   MRSA, PCR NEGATIVE NEGATIVE Final   Staphylococcus aureus POSITIVE (A) NEGATIVE Final    Comment: (NOTE) The Xpert SA Assay (FDA approved for NASAL specimens in patients 34 years of age and older), is one component of a comprehensive surveillance program. It is not intended to diagnose infection nor to guide or monitor treatment. Performed at [facility], [address] [place], [place] [address]   Urine Culture (for pregnant, neutropenic or urologic patients or patients with an indwelling urinary catheter)  Status: None   Collection Time: 11/26/23  6:08 PM   Specimen: Urine, Clean Catch  Result Value Ref Range Status   Specimen Description   Final    URINE, CLEAN CATCH Performed at [facility], [address], [place], [place] [address]    Special Requests   Final    NONE Performed at [facility], [address] [place], [place] [address]    Culture   Final    NO GROWTH Performed at [facility], [address], [place], [place] [address]    Report Status 11/28/2023 FINAL  Final    Studies/Results: No results found.   Medications: Scheduled  Meds:  sodium chloride    Intravenous Once   acetaminophen   650 mg Oral Once   diphenhydrAMINE   25 mg Oral Once   folic acid   1 mg Oral Daily   heparin   5,000 Units Subcutaneous Q8H   HYDROmorphone    Intravenous Q4H   hydroxyurea   1,500 mg Oral Daily   multivitamin with minerals  1 tablet Oral Daily   senna-docusate  1 tablet Oral BID    Continuous Infusions:  ampicillin-sulbactam (UNASYN) IV 3 g (12/01/23 1248)    PRN Meds:.acetaminophen  **OR** acetaminophen , albuterol, diphenhydrAMINE , ketorolac , naloxone  **AND** sodium chloride  flush, ondansetron  **OR** ondansetron  (ZOFRAN ) IV, mouth rinse, polyethylene glycol, sodium chloride  flush  Consultants: GI consult  Procedures:  S/P laparoscopic cholecystectomy  Antibiotics: None  Assessment/Plan: Principal Problem:   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 Active Problems:   Sickle cell pain crisis (HCC)   Leukocytosis   Chronic pain syndrome   Abdominal pain   Cholelithiasis with chronic cholecystitis with choledocholithiasis: S/P Laparoscopic Cholecystectomy with near-infrared fluorescent cholangiography Procedure completed on 11/22/2023.Doing well post op, patient tolerated procedure well without complication. He is okay to discharge per surgical team and follow up out patient. Intractable Nausea & Vomiting: MR abdomen MRCP completed: Increase caliber of the common bile duct measures 0.9 cm. The MRCP images are obscured by motion artifact. On the coronal T2 weighted sequences there is suggestion of 2 small stones within the distal CBD which measure up to 3 mm. This cannot be confirmed on either the MRCP images or the axial T2 weighted sequences. Consider further evaluation with ERCP.  Suggestion of indeterminate upper pole lesion off the lateral cortex of the right kidney measuring 1.7 cm. This appears T1 isointense, limits assessment of the adrenal glands. Cannot assess for underlying  enhancement due to artifact created by spinal hardware. Consider further evaluation with renal sonogram. CT abdomen: Mild nodular airspace disease in the RIGHT lower lobe suggest mild aspiration pneumonitis or pneumonia. Post cholecystectomy. No complicating features. No fluid collections or biliary obstruction. 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 Ongoing mild nausea, continue Zofran .  Hb Sickle Cell Disease with Pain crisis: Continue IVF 0.45% Saline KVO, continue weight based Dilaudid  PCA, IV Toradol  15 mg Q 6 H for a total of 5 days, continue oral home pain medications as ordered. Monitor vitals very closely, Re-evaluate pain scale regularly, 2 L of Oxygen by [place]. Patient encouraged to ambulate on the hallway today.  Constipation: Resolved Anemia of Chronic Disease: slightly lower than patients baseline. No clinical indication for  transfusion. Will continue to monitor daily CBC Chronic pain Syndrome: Continue oral pain medication     Code Status: Full Code Family Communication: N/A Disposition Plan: ready for discharge in the a.m.  [name] Cover NP   If 7PM-7AM, please contact night-coverage.  11/30/2023, 12:08 PM   LOS: 5 days

## 2023-11-30 NOTE — Plan of Care (Signed)
 Problem: Education: Goal: Knowledge of General Education information will improve Description: Including pain rating scale, medication(s)/side effects and non-pharmacologic comfort measures 11/30/2023 1904 by Angelica Angeline CROME, RN Outcome: Progressing 11/30/2023 1904 by Angelica Angeline CROME, RN Outcome: Progressing   Problem: Health Behavior/Discharge Planning: Goal: Ability to manage health-related needs will improve 11/30/2023 1904 by Angelica Angeline CROME, RN Outcome: Progressing 11/30/2023 1904 by Angelica Angeline CROME, RN Outcome: Progressing   Problem: Clinical Measurements: Goal: Ability to maintain clinical measurements within normal limits will improve 11/30/2023 1904 by Angelica Angeline CROME, RN Outcome: Progressing 11/30/2023 1904 by Angelica Angeline CROME, RN Outcome: Progressing Goal: Will remain free from infection 11/30/2023 1904 by Angelica Angeline CROME, RN Outcome: Progressing 11/30/2023 1904 by Angelica Angeline CROME, RN Outcome: Progressing Goal: Diagnostic test results will improve 11/30/2023 1904 by Angelica Angeline CROME, RN Outcome: Progressing 11/30/2023 1904 by Angelica Angeline CROME, RN Outcome: Progressing Goal: Respiratory complications will improve 11/30/2023 1904 by Angelica Angeline CROME, RN Outcome: Progressing 11/30/2023 1904 by Angelica Angeline CROME, RN Outcome: Progressing Goal: Cardiovascular complication will be avoided 11/30/2023 1904 by Angelica Angeline CROME, RN Outcome: Progressing 11/30/2023 1904 by Angelica Angeline CROME, RN Outcome: Progressing   Problem: Activity: Goal: Risk for activity intolerance will decrease 11/30/2023 1904 by Angelica Angeline CROME, RN Outcome: Progressing 11/30/2023 1904 by Angelica Angeline CROME, RN Outcome: Progressing   Problem: Nutrition: Goal: Adequate nutrition will be maintained 11/30/2023 1904 by Angelica Angeline CROME, RN Outcome: Progressing 11/30/2023 1904 by Angelica Angeline CROME, RN Outcome: Progressing   Problem: Coping: Goal: Level of anxiety will decrease 11/30/2023 1904 by Angelica Angeline CROME, RN Outcome:  Progressing 11/30/2023 1904 by Angelica Angeline CROME, RN Outcome: Progressing   Problem: Elimination: Goal: Will not experience complications related to bowel motility 11/30/2023 1904 by Angelica Angeline CROME, RN Outcome: Progressing 11/30/2023 1904 by Angelica Angeline CROME, RN Outcome: Progressing Goal: Will not experience complications related to urinary retention 11/30/2023 1904 by Angelica Angeline CROME, RN Outcome: Progressing 11/30/2023 1904 by Angelica Angeline CROME, RN Outcome: Progressing   Problem: Pain Managment: Goal: General experience of comfort will improve and/or be controlled 11/30/2023 1904 by Angelica Angeline CROME, RN Outcome: Progressing 11/30/2023 1904 by Angelica Angeline CROME, RN Outcome: Progressing   Problem: Safety: Goal: Ability to remain free from injury will improve 11/30/2023 1904 by Angelica Angeline CROME, RN Outcome: Progressing 11/30/2023 1904 by Angelica Angeline CROME, RN Outcome: Progressing   Problem: Skin Integrity: Goal: Risk for impaired skin integrity will decrease 11/30/2023 1904 by Angelica Angeline CROME, RN Outcome: Progressing 11/30/2023 1904 by Angelica Angeline CROME, RN Outcome: Progressing   Problem: Education: Goal: Knowledge of vaso-occlusive preventative measures will improve 11/30/2023 1904 by Angelica Angeline CROME, RN Outcome: Progressing 11/30/2023 1904 by Angelica Angeline CROME, RN Outcome: Progressing Goal: Awareness of infection prevention will improve 11/30/2023 1904 by Angelica Angeline CROME, RN Outcome: Progressing 11/30/2023 1904 by Angelica Angeline CROME, RN Outcome: Progressing Goal: Awareness of signs and symptoms of anemia will improve 11/30/2023 1904 by Angelica Angeline CROME, RN Outcome: Progressing 11/30/2023 1904 by Angelica Angeline CROME, RN Outcome: Progressing Goal: Long-term complications will improve 11/30/2023 1904 by Angelica Angeline CROME, RN Outcome: Progressing 11/30/2023 1904 by Angelica Angeline CROME, RN Outcome: Progressing   Problem: Self-Care: Goal: Ability to incorporate actions that prevent/reduce pain crisis will  improve 11/30/2023 1904 by Angelica Angeline CROME, RN Outcome: Progressing 11/30/2023 1904 by Angelica Angeline CROME, RN Outcome: Progressing   Problem: Bowel/Gastric: Goal: Gut motility will be maintained 11/30/2023 1904 by Angelica Angeline CROME, RN Outcome: Progressing 11/30/2023 1904  by Angelica Angeline CROME, RN Outcome: Progressing   Problem: Tissue Perfusion: Goal: Complications related to inadequate tissue perfusion will be avoided or minimized 11/30/2023 1904 by Angelica Angeline CROME, RN Outcome: Progressing 11/30/2023 1904 by Angelica Angeline CROME, RN Outcome: Progressing   Problem: Respiratory: Goal: Pulmonary complications will be avoided or minimized 11/30/2023 1904 by Angelica Angeline CROME, RN Outcome: Progressing 11/30/2023 1904 by Angelica Angeline CROME, RN Outcome: Progressing Goal: Acute Chest Syndrome will be identified early to prevent complications 11/30/2023 1904 by Angelica Angeline CROME, RN Outcome: Progressing 11/30/2023 1904 by Angelica Angeline CROME, RN Outcome: Progressing   Problem: Fluid Volume: Goal: Ability to maintain a balanced intake and output will improve 11/30/2023 1904 by Angelica Angeline CROME, RN Outcome: Progressing 11/30/2023 1904 by Angelica Angeline CROME, RN Outcome: Progressing   Problem: Sensory: Goal: Pain level will decrease with appropriate interventions 11/30/2023 1904 by Angelica Angeline CROME, RN Outcome: Progressing 11/30/2023 1904 by Angelica Angeline CROME, RN Outcome: Progressing   Problem: Health Behavior: Goal: Postive changes in compliance with treatment and prescription regimens will improve 11/30/2023 1904 by Angelica Angeline CROME, RN Outcome: Progressing 11/30/2023 1904 by Angelica Angeline CROME, RN Outcome: Progressing

## 2023-11-30 NOTE — Plan of Care (Signed)
  Problem: Education: Goal: Knowledge of General Education information will improve Description: Including pain rating scale, medication(s)/side effects and non-pharmacologic comfort measures Outcome: Progressing   Problem: Activity: Goal: Risk for activity intolerance will decrease Outcome: Progressing   Problem: Pain Managment: Goal: General experience of comfort will improve and/or be controlled Outcome: Progressing   Problem: Clinical Measurements: Goal: Will remain free from infection Outcome: Not Progressing

## 2023-11-30 NOTE — Plan of Care (Signed)

## 2023-12-01 LAB — CBC
HCT: 19.6 % — ABNORMAL LOW (ref 39.0–52.0)
Hemoglobin: 7.1 g/dL — ABNORMAL LOW (ref 13.0–17.0)
MCH: 39.2 pg — ABNORMAL HIGH (ref 26.0–34.0)
MCHC: 36.2 g/dL — ABNORMAL HIGH (ref 30.0–36.0)
MCV: 108.3 fL — ABNORMAL HIGH (ref 80.0–100.0)
Platelets: 537 10*3/uL — ABNORMAL HIGH (ref 150–400)
RBC: 1.81 MIL/uL — ABNORMAL LOW (ref 4.22–5.81)
RDW: 18.6 % — ABNORMAL HIGH (ref 11.5–15.5)
WBC: 12 10*3/uL — ABNORMAL HIGH (ref 4.0–10.5)
nRBC: 0.7 % — ABNORMAL HIGH (ref 0.0–0.2)

## 2023-12-01 MED ORDER — METOCLOPRAMIDE HCL 10 MG PO TABS
10.0000 mg | ORAL_TABLET | Freq: Once | ORAL | Status: AC
  Administered 2023-12-01: 10 mg via ORAL
  Filled 2023-12-01: qty 1

## 2023-12-01 NOTE — Plan of Care (Signed)

## 2023-12-01 NOTE — Progress Notes (Signed)
 Patient ID: Manuel Mcguire, male   DOB: 03-22-1991, 33 y.o.   MRN: [id number] Subjective: Manuel Mcguire is a 33 year old male with a medical history significant for sickle cell disease was admitted for sickle cell pain crisis also having abdominal pain with nausea with vomiting.   Patient reports pain of 6/10 today ongoing nausea. He denies vomiting, fever, headache, SOB, cough. No urinary symptoms.    Objective:  Vital signs in last 24 hours:  Vitals:   12/01/23 0806 12/01/23 1024 12/01/23 1246 12/01/23 1340  BP:  129/77  122/82  Pulse:  70  70  Resp: 17 18 12 20   Temp:  98.4 F (36.9 C)  98.6 F (37 C)  TempSrc:  Oral  Oral  SpO2: 100% 98% 99% 96%  Weight:      Height:        Intake/Output from previous day:   Intake/Output Summary (Last 24 hours) at 12/01/2023 1617 Last data filed at 12/01/2023 1246 Gross per 24 hour  Intake 798.79 ml  Output 3025 ml  Net -2226.21 ml     Physical Exam: General: Alert, awake, oriented x3, in no acute distress.  HEENT: [place]/AT PEERL, EOMI Neck: Trachea midline,  no masses, no thyromegal,y no JVD, no carotid bruit OROPHARYNX:  Moist, No exudate/ erythema/lesions.  Heart: Regular rate and rhythm, without murmurs, rubs, gallops, PMI non-displaced, no heaves or thrills on palpation.  Lungs: Clear to auscultation, no wheezing or rhonchi noted. No increased vocal fremitus resonant to percussion  Abdomen: Tenderness, 4 laparoscopy sight  Neuro: No focal neurological deficits noted cranial nerves II through XII grossly intact. DTRs 2+ bilaterally upper and lower extremities. Strength 5 out of 5 in bilateral upper and lower extremities. Musculoskeletal: generalize body tenderness. Psychiatric: Patient alert and oriented x3, good insight and cognition, good recent to remote recall. Lymph node survey: No cervical axillary or inguinal lymphadenopathy noted.  Lab Results:  Basic Metabolic Panel:    Component Value Date/Time   NA 138 11/29/2023  0500   K 3.6 11/29/2023 0500   CL 106 11/29/2023 0500   CO2 25 11/29/2023 0500   BUN 10 11/29/2023 0500   CREATININE 0.50 (L) 11/29/2023 0500   GLUCOSE 132 (H) 11/29/2023 0500   CALCIUM 8.4 (L) 11/29/2023 0500   CBC:    Component Value Date/Time   WBC 12.0 (H) 12/01/2023 0529   HGB 7.1 (L) 12/01/2023 0529   HCT 19.6 (L) 12/01/2023 0529   PLT 537 (H) 12/01/2023 0529   MCV 108.3 (H) 12/01/2023 0529   NEUTROABS 15.8 (H) 11/26/2023 1915   LYMPHSABS 1.8 11/26/2023 1915   MONOABS 2.1 (H) 11/26/2023 1915   EOSABS 0.1 11/26/2023 1915   BASOSABS 0.0 11/26/2023 1915    Recent Results (from the past 240 hours)  Surgical PCR screen     Status: Abnormal   Collection Time: 11/22/23  7:36 AM   Specimen: Nasal Mucosa; Nasal Swab  Result Value Ref Range Status   MRSA, PCR NEGATIVE NEGATIVE Final   Staphylococcus aureus POSITIVE (A) NEGATIVE Final    Comment: (NOTE) The Xpert SA Assay (FDA approved for NASAL specimens in patients 65 years of age and older), is one component of a comprehensive surveillance program. It is not intended to diagnose infection nor to guide or monitor treatment. Performed at [facility], [address] [place], [place] [address]   Urine Culture (for pregnant, neutropenic or urologic patients or patients with an indwelling urinary catheter)  Status: None   Collection Time: 11/26/23  6:08 PM   Specimen: Urine, Clean Catch  Result Value Ref Range Status   Specimen Description   Final    URINE, CLEAN CATCH Performed at [facility], [address] [place], [place] [address]    Special Requests   Final    NONE Performed at [facility], [address], [place], [place] [address]    Culture   Final    NO GROWTH Performed at [facility], [address] [place], [place] [address]    Report Status 11/28/2023 FINAL  Final    Studies/Results: No results found.   Medications: Scheduled  Meds:  sodium chloride    Intravenous Once   acetaminophen   650 mg Oral Once   diphenhydrAMINE   25 mg Oral Once   folic acid   1 mg Oral Daily   heparin   5,000 Units Subcutaneous Q8H   HYDROmorphone    Intravenous Q4H   hydroxyurea   1,500 mg Oral Daily   multivitamin with minerals  1 tablet Oral Daily   senna-docusate  1 tablet Oral BID    Continuous Infusions:  ampicillin-sulbactam (UNASYN) IV 3 g (12/01/23 1248)    PRN Meds:.acetaminophen  **OR** acetaminophen , albuterol, diphenhydrAMINE , ketorolac , naloxone  **AND** sodium chloride  flush, ondansetron  **OR** ondansetron  (ZOFRAN ) IV, mouth rinse, polyethylene glycol, sodium chloride  flush  Consultants: GI consult (Completed)   Procedures:  S/P laparoscopic cholecystectomy  Antibiotics: None  Assessment/Plan: Principal Problem:   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 Active Problems:   Sickle cell pain crisis (HCC)   Leukocytosis   Chronic pain syndrome   Abdominal pain  Cholelithiasis with chronic cholecystitis with choledocholithiasis: S/P Laparoscopic Cholecystectomy with near-infrared fluorescent cholangiography Procedure completed on 11/22/2023.Doing well post op, patient tolerated procedure well without complication. He is okay to discharge per surgical team and follow up out patient. Intractable Nausea & Vomiting: MR abdomen MRCP completed: Increase caliber of the common bile duct measures 0.9 cm. The MRCP images are obscured by motion artifact. On the coronal T2 weighted sequences there is suggestion of 2 small stones within the distal CBD which measure up to 3 mm. This cannot be confirmed on either the MRCP images or the axial T2 weighted sequences. Consider further evaluation with ERCP.  Suggestion of indeterminate upper pole lesion off the lateral cortex of the right kidney measuring 1.7 cm. This appears T1 isointense, limits assessment of the adrenal glands. Cannot assess for  underlying enhancement due to artifact created by spinal hardware. Consider further evaluation with renal sonogram. CT abdomen: Mild nodular airspace disease in the RIGHT lower lobe suggest mild aspiration pneumonitis or pneumonia. Post cholecystectomy. No complicating features. No fluid collections or biliary obstruction. 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 Ongoing mild nausea, continue Zofran . One time dose of Reglan   Hb Sickle Cell Disease with Pain crisis: Continue IVF 0.45% Saline KVO, continue weight based Dilaudid  PCA, IV Toradol  15 mg Q 6 H for a total of 5 days, continue oral home pain medications as ordered. Monitor vitals very closely, Re-evaluate pain scale regularly, 2 L of Oxygen by [place]. Patient encouraged to ambulate on the hallway today.  Constipation: Resolved Anemia of Chronic Disease: slightly lower than patients baseline. No clinical indication for  transfusion. Will continue to monitor daily CBC Chronic pain Syndrome: Continue oral pain medication        Code Status: Full Code Family Communication: N/A Disposition Plan: ready for discharge in the a.m.  [name] Cover  NP   If 7PM-7AM, please contact night-coverage.  12/01/2023, 4:17 PM  LOS: 5 days

## 2023-12-02 LAB — CBC
HCT: 18.6 % — ABNORMAL LOW (ref 39.0–52.0)
Hemoglobin: 6.6 g/dL — CL (ref 13.0–17.0)
MCH: 37.5 pg — ABNORMAL HIGH (ref 26.0–34.0)
MCHC: 35.5 g/dL (ref 30.0–36.0)
MCV: 105.7 fL — ABNORMAL HIGH (ref 80.0–100.0)
Platelets: 488 10*3/uL — ABNORMAL HIGH (ref 150–400)
RBC: 1.76 MIL/uL — ABNORMAL LOW (ref 4.22–5.81)
RDW: 18.3 % — ABNORMAL HIGH (ref 11.5–15.5)
WBC: 11.9 10*3/uL — ABNORMAL HIGH (ref 4.0–10.5)
nRBC: 1.2 % — ABNORMAL HIGH (ref 0.0–0.2)

## 2023-12-02 LAB — BASIC METABOLIC PANEL WITH GFR
Anion gap: 7 (ref 5–15)
BUN: 5 mg/dL — ABNORMAL LOW (ref 6–20)
CO2: 25 mmol/L (ref 22–32)
Calcium: 8.3 mg/dL — ABNORMAL LOW (ref 8.9–10.3)
Chloride: 110 mmol/L (ref 98–111)
Creatinine, Ser: 0.69 mg/dL (ref 0.61–1.24)
GFR, Estimated: >60 mL/min (ref >60)
Glucose, Bld: 90 mg/dL (ref 70–99)
Potassium: 3.4 mmol/L — ABNORMAL LOW (ref 3.5–5.1)
Sodium: 142 mmol/L (ref 135–145)

## 2023-12-02 LAB — PREPARE RBC (CROSSMATCH)

## 2023-12-02 MED ORDER — ACETAMINOPHEN 325 MG PO TABS
650.0000 mg | ORAL_TABLET | Freq: Once | ORAL | Status: AC
  Administered 2023-12-02: 650 mg via ORAL
  Filled 2023-12-02: qty 2

## 2023-12-02 MED ORDER — OXYCODONE HCL 5 MG PO TABS
5.0000 mg | ORAL_TABLET | ORAL | Status: DC | PRN
  Administered 2023-12-02 – 2023-12-03 (×4): 5 mg via ORAL
  Filled 2023-12-02 (×4): qty 1

## 2023-12-02 MED ORDER — METOCLOPRAMIDE HCL 10 MG PO TABS: 10.0000 mg | ORAL_TABLET | Freq: Three times a day (TID) | ORAL | 1 refills | Status: AC

## 2023-12-02 MED ORDER — DIPHENHYDRAMINE HCL 25 MG PO CAPS
25.0000 mg | ORAL_CAPSULE | Freq: Once | ORAL | Status: AC
  Administered 2023-12-02: 25 mg via ORAL
  Filled 2023-12-02: qty 1

## 2023-12-02 MED ORDER — SODIUM CHLORIDE 0.9% IV SOLUTION: Freq: Once | INTRAVENOUS | Status: AC

## 2023-12-02 MED ORDER — OXYCODONE HCL 5 MG PO TABS: 5.0000 mg | ORAL_TABLET | Freq: Four times a day (QID) | ORAL | 0 refills | Status: AC | PRN

## 2023-12-02 NOTE — Progress Notes (Signed)
 Patient ID: Manuel Mcguire, male   DOB: [birth year], 33 y.o.   MRN: [id number] Subjective: Manuel Mcguire is a 33 year old male with a medical history significant for sickle cell disease was admitted for sickle cell pain crisis also having abdominal pain with nausea with vomiting.   Patient reports pain of 5/10 today mild nausea. He denies vomiting, fever, headache, SOB, cough. No urinary symptoms.    Objective:  Vital signs in last 24 hours:  Vitals:   12/02/23 0806 12/02/23 0932 12/02/23 1205 12/02/23 1359  BP:  126/78  123/80  Pulse:  74  63  Resp: 17 17 15 17   Temp:  98.5 F (36.9 C)  98.5 F (36.9 C)  TempSrc:    Oral  SpO2: 94% 96%  95%  Weight:      Height:        Intake/Output from previous day:   Intake/Output Summary (Last 24 hours) at 12/02/2023 1505 Last data filed at 12/02/2023 0933 Gross per 24 hour  Intake 1446.06 ml  Output 2175 ml  Net -728.94 ml     Physical Exam: General: Alert, awake, oriented x3, in no acute distress.  HEENT: /AT PEERL, EOMI Neck: Trachea midline,  no masses, no thyromegal,y no JVD, no carotid bruit OROPHARYNX:  Moist, No exudate/ erythema/lesions.  Heart: Regular rate and rhythm, without murmurs, rubs, gallops, PMI non-displaced, no heaves or thrills on palpation.  Lungs: Clear to auscultation, no wheezing or rhonchi noted. No increased vocal fremitus resonant to percussion  Abdomen: Tenderness, 4 laparoscopy sight  Neuro: No focal neurological deficits noted cranial nerves II through XII grossly intact. DTRs 2+ bilaterally upper and lower extremities. Strength 5 out of 5 in bilateral upper and lower extremities. Musculoskeletal: generalize body tenderness. Psychiatric: Patient alert and oriented x3, good insight and cognition, good recent to remote recall. Lymph node survey: No cervical axillary or inguinal lymphadenopathy noted.  Lab Results:  Basic Metabolic Panel:    Component Value Date/Time   NA 142 12/02/2023 0621    K 3.4 (L) 12/02/2023 0621   CL 110 12/02/2023 0621   CO2 25 12/02/2023 0621   BUN 5 (L) 12/02/2023 0621   CREATININE 0.69 12/02/2023 0621   GLUCOSE 90 12/02/2023 0621   CALCIUM 8.3 (L) 12/02/2023 0621   CBC:    Component Value Date/Time   WBC 11.9 (H) 12/02/2023 0621   HGB 6.6 (LL) 12/02/2023 0621   HCT 18.6 (L) 12/02/2023 0621   PLT 488 (H) 12/02/2023 0621   MCV 105.7 (H) 12/02/2023 0621   NEUTROABS 15.8 (H) 11/26/2023 1915   LYMPHSABS 1.8 11/26/2023 1915   MONOABS 2.1 (H) 11/26/2023 1915   EOSABS 0.1 11/26/2023 1915   BASOSABS 0.0 11/26/2023 1915    Recent Results (from the past 240 hours)  Urine Culture (for pregnant, neutropenic or urologic patients or patients with an indwelling urinary catheter)     Status: None   Collection Time: 11/26/23  6:08 PM   Specimen: Urine, Clean Catch  Result Value Ref Range Status   Specimen Description   Final    URINE, CLEAN CATCH Performed at [facility], [address], [place], [place] [address]    Special Requests   Final    NONE Performed at [facility], [address] [place], [place] [address]    Culture   Final    NO GROWTH Performed at [facility], [address] [place], [place] [address]    Report Status  11/28/2023 FINAL  Final    Studies/Results: No results found.   Medications: Scheduled Meds:  acetaminophen   650 mg Oral Once   folic acid   1 mg Oral Daily   heparin   5,000 Units Subcutaneous Q8H   HYDROmorphone    Intravenous Q4H   hydroxyurea   1,500 mg Oral Daily   multivitamin with minerals  1 tablet Oral Daily   senna-docusate  1 tablet Oral BID    Continuous Infusions:  ampicillin -sulbactam (UNASYN ) IV 3 g (12/02/23 1208)    PRN Meds:.acetaminophen  **OR** acetaminophen , albuterol , diphenhydrAMINE , naloxone  **AND** sodium chloride  flush, ondansetron  **OR** ondansetron  (ZOFRAN ) IV, mouth rinse, polyethylene glycol, sodium chloride  flush  Consultants: GI  consult (Completed)   Procedures:  S/P laparoscopic cholecystectomy  Antibiotics: None  Assessment/Plan: Principal Problem:   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 Active Problems:   Sickle cell pain crisis (HCC)   Leukocytosis   Chronic pain syndrome   Abdominal pain  Cholelithiasis with chronic cholecystitis with choledocholithiasis: S/P Laparoscopic Cholecystectomy with near-infrared fluorescent cholangiography Procedure completed on 11/22/2023.Doing well post op, patient tolerated procedure well without complication. He is okay to discharge per surgical team and follow up out patient. Intractable Nausea & Vomiting: MR abdomen MRCP completed: Increase caliber of the common bile duct measures 0.9 cm. The MRCP images are obscured by motion artifact. On the coronal T2 weighted sequences there is suggestion of 2 small stones within the distal CBD which measure up to 3 mm. This cannot be confirmed on either the MRCP images or the axial T2 weighted sequences. Consider further evaluation with ERCP.  Suggestion of indeterminate upper pole lesion off the lateral cortex of the right kidney measuring 1.7 cm. This appears T1 isointense, limits assessment of the adrenal glands. Cannot assess for underlying enhancement due to artifact created by spinal hardware. Consider further evaluation with renal sonogram. CT abdomen: Mild nodular airspace disease in the RIGHT lower lobe suggest mild aspiration pneumonitis or pneumonia. Post cholecystectomy. No complicating features. No fluid collections or biliary obstruction. 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 Ongoing mild nausea, continue Zofran . One time dose of Reglan   Hb Sickle Cell Disease with Pain crisis: Continue IVF 0.45% Saline KVO, continue weight based Dilaudid  PCA, IV Toradol  15 mg Q 6 H for a total of 5 days, continue oral home pain medications as ordered. Monitor vitals very closely,  Re-evaluate pain scale regularly, 2 L of Oxygen by [place]. Patient encouraged to ambulate on the hallway today.  Constipation: Resolved Anemia of Chronic Disease: Hemoglobin at 6.6 g/dL lower than patient's baseline.  1 unit packed red blood cells transfusion.  Repeat CBC.   Chronic pain Syndrome: Continue oral pain medication        Code Status: Full Code Family Communication: N/A Disposition Plan: ready for discharge in the a.m.  [name] Cover NP   If 7PM-7AM, please contact night-coverage.  12/02/2023, 3:05 PM  LOS: 6 days

## 2023-12-02 NOTE — Plan of Care (Signed)

## 2023-12-02 NOTE — Discharge Summary (Signed)
 Erroneous encounter

## 2023-12-02 NOTE — Plan of Care (Signed)

## 2023-12-03 DIAGNOSIS — K8064 Calculus of gallbladder and bile duct with chronic cholecystitis without obstruction: Secondary | ICD-10-CM | POA: Diagnosis not present

## 2023-12-03 LAB — CBC
HCT: 23.5 % — ABNORMAL LOW (ref 39.0–52.0)
Hemoglobin: 8.5 g/dL — ABNORMAL LOW (ref 13.0–17.0)
MCH: 36.6 pg — ABNORMAL HIGH (ref 26.0–34.0)
MCHC: 36.2 g/dL — ABNORMAL HIGH (ref 30.0–36.0)
MCV: 101.3 fL — ABNORMAL HIGH (ref 80.0–100.0)
Platelets: 603 10*3/uL — ABNORMAL HIGH (ref 150–400)
RBC: 2.32 MIL/uL — ABNORMAL LOW (ref 4.22–5.81)
RDW: 19.6 % — ABNORMAL HIGH (ref 11.5–15.5)
WBC: 14.8 10*3/uL — ABNORMAL HIGH (ref 4.0–10.5)
nRBC: 1.4 % — ABNORMAL HIGH (ref 0.0–0.2)

## 2023-12-03 NOTE — Discharge Summary (Addendum)
 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 [facility] Course: Patient was admitted with constant abdominal pain with recent Lap Cholecystectomy, and sickle cell crisis admitted and GI consulted. Patient had elevated LFTs and 2 small choledocholithiasis. Symptomatic management was done and patient gradually improved and LFTs normalized. Patient was treated with IV Dilaudid  PCA and Toradol . Patient subsequently transitioned to home regimen at DC  Assessment and Plan: No notes have been filed under this hospital service. Service: Hospitalist        Consultants: [facility] Procedures performed: Chest X ray, MRCP, Abdominal Ultrasound  Disposition: Home Diet recommendation:  Discharge Diet Orders (From admission, onward)     Start     Ordered   12/03/23 0000  Diet - low sodium heart healthy        12/03/23 1341   12/02/23 0000  Diet - low sodium heart healthy        12/02/23 0914           Regular diet DISCHARGE MEDICATION: Allergies as of 12/03/2023   No Known Allergies      Medication List     TAKE these medications    cholecalciferol  25 MCG (1000 UNIT) tablet Commonly known as: VITAMIN D3 Take 1,000 Units by mouth daily.   folic acid  1 MG tablet Commonly known as: FOLVITE  Take 1 tablet (1 mg total) by mouth daily.   hydroxyurea  500 MG capsule Commonly known as: HYDREA  Take 3 capsules (1,500 mg total) by mouth daily. May take with food to  minimize GI side effects.   lisinopril  2.5 MG tablet Commonly known as: ZESTRIL  Take 2.5 mg by mouth daily.   metoCLOPramide  10 MG tablet Commonly known as: REGLAN  Take 1 tablet (10 mg total) by mouth 3 (three) times daily with meals.   ondansetron  4 MG disintegrating tablet Commonly known as: ZOFRAN -ODT 4mg  ODT q4 hours prn nausea/vomit   oxyCODONE  5 MG immediate release tablet Commonly known as: Oxy IR/ROXICODONE  Take 1 tablet (5 mg total) by mouth every 6 (six) hours as needed for severe pain (pain score 7-10).        Discharge Exam: Filed Weights   11/26/23 2230  Weight: 71 kg   Constitutional: NAD, calm, comfortable Eyes: PERRL, lids and conjunctivae normal ENMT: Mucous membranes are moist. Posterior pharynx clear of any exudate or lesions.Normal dentition.  Neck: normal, supple, no masses, no thyromegaly Respiratory: clear to auscultation bilaterally, no wheezing, no crackles. Normal respiratory effort. No accessory muscle use.  Cardiovascular: Regular rate and rhythm, no murmurs / rubs / gallops. No extremity edema. 2+ pedal pulses. No carotid bruits.  Abdomen: no tenderness, no masses palpated. No hepatosplenomegaly. Bowel sounds positive.  Musculoskeletal: Good range of motion, no joint swelling or tenderness, Skin: no rashes, lesions, ulcers. No induration Neurologic: CN 2-12 grossly intact. Sensation intact, DTR normal. Strength 5/5 in all 4.  Psychiatric: Normal judgment and insight. Alert and oriented x 3. Normal mood   Condition at discharge:  good  The results of significant diagnostics from this hospitalization (including imaging, microbiology, ancillary and laboratory) are listed below for reference.   Imaging Studies: MR ABDOMEN MRCP W WO CONTAST Result Date: 12/11/2023 CLINICAL DATA:  Abdominal pain, epigastric pain, recent cholecystectomy EXAM: MRI ABDOMEN WITHOUT AND WITH CONTRAST (INCLUDING MRCP) TECHNIQUE: Multiplanar multisequence MR imaging of the  abdomen was performed both before and after the administration of intravenous contrast. Heavily T2-weighted images of the biliary and pancreatic ducts were obtained, and three-dimensional MRCP images were rendered by post processing. CONTRAST:  7mL GADAVIST  GADOBUTROL  1 MMOL/ML IV SOLN COMPARISON:  11/27/2023 FINDINGS: Metallic susceptibility artifact from thoracolumbar fusion significantly degrades multiple sequences submitted for review, particularly fat saturated T2 and multiphasic contrast enhanced sequences. Lower chest: No acute abnormality.  Cardiomegaly. Hepatobiliary: No focal liver abnormality is seen. Status post cholecystectomy. Mild dilatation of the common bile duct measuring up to 0.8 cm, tapering to the ampulla without calculus or other obstruction visible (series 3, image 13). Pancreas: Unremarkable. No pancreatic ductal dilatation or surrounding inflammatory changes. Spleen: Normal in size without significant abnormality. Adrenals/Urinary Tract: Adrenal glands are unremarkable. Lesion of the peripheral superior pole of the right kidney is more clearly characterized as a benign, nonenhancing hemorrhagic or proteinaceous cyst on today's examination. Kidneys are otherwise normal, without obvious renal calculi, solid lesion, or hydronephrosis. Stomach/Bowel: Stomach is within normal limits. No evidence of bowel wall thickening, distention, or inflammatory changes. Vascular/Lymphatic: No significant vascular findings are present. No enlarged abdominal lymph nodes. Other: No abdominal wall hernia or abnormality. No ascites. Musculoskeletal: No acute or significant osseous findings. Metallic susceptibility artifact secondary to thoracolumbar fusion. IMPRESSION: 1. Metallic susceptibility artifact from thoracolumbar fusion significantly degrades multiple sequences submitted for review, particularly fat saturated T2 and multiphasic contrast enhanced sequences. 2. Status post cholecystectomy. 3. Mild  dilatation of the common bile duct measuring up to 0.8 cm, tapering to the ampulla without calculus or other obstruction visible. 4. Cardiomegaly. Electronically Signed   By: Marolyn JONETTA Jaksch M.D.   On: 12/11/2023 15:37   MR 3D Recon At Scanner Result Date: 12/11/2023 CLINICAL DATA:  Abdominal pain, epigastric pain, recent cholecystectomy EXAM: MRI ABDOMEN WITHOUT AND WITH CONTRAST (INCLUDING MRCP) TECHNIQUE: Multiplanar multisequence MR imaging of the abdomen was performed both before and after the administration of intravenous contrast. Heavily T2-weighted images of the biliary and pancreatic ducts were obtained, and three-dimensional MRCP images were rendered by post processing. CONTRAST:  7mL GADAVIST  GADOBUTROL  1 MMOL/ML IV SOLN COMPARISON:  11/27/2023 FINDINGS: Metallic susceptibility artifact from thoracolumbar fusion significantly degrades multiple sequences submitted for review, particularly fat saturated T2 and multiphasic contrast enhanced sequences. Lower chest: No acute abnormality.  Cardiomegaly. Hepatobiliary: No focal liver abnormality is seen. Status post cholecystectomy. Mild dilatation of the common bile duct measuring up to 0.8 cm, tapering to the ampulla without calculus or other obstruction visible (series 3, image 13). Pancreas: Unremarkable. No pancreatic ductal dilatation or surrounding inflammatory changes. Spleen: Normal in size without significant abnormality. Adrenals/Urinary Tract: Adrenal glands are unremarkable. Lesion of the peripheral superior pole of the right kidney is more clearly characterized as a benign, nonenhancing hemorrhagic or proteinaceous cyst on today's examination. Kidneys are otherwise normal, without obvious renal calculi, solid lesion, or hydronephrosis. Stomach/Bowel: Stomach is within normal limits. No evidence of bowel wall thickening, distention, or inflammatory changes. Vascular/Lymphatic: No significant vascular findings are present. No enlarged abdominal  lymph nodes. Other: No abdominal wall hernia or abnormality. No ascites. Musculoskeletal: No acute or significant osseous findings. Metallic  susceptibility artifact secondary to thoracolumbar fusion. IMPRESSION: 1. Metallic susceptibility artifact from thoracolumbar fusion significantly degrades multiple sequences submitted for review, particularly fat saturated T2 and multiphasic contrast enhanced sequences. 2. Status post cholecystectomy. 3. Mild dilatation of the common bile duct measuring up to 0.8 cm, tapering to the ampulla without calculus or other obstruction visible. 4. Cardiomegaly. Electronically Signed   By: Marolyn JONETTA Jaksch M.D.   On: 12/11/2023 15:37   US  Abdomen Limited RUQ (LIVER/GB) Result Date: 12/09/2023 CLINICAL DATA:  History of prior cholecystectomy on November 22, 2023, presenting with right upper quadrant pain. EXAM: ULTRASOUND ABDOMEN LIMITED RIGHT UPPER QUADRANT COMPARISON:  November 20, 2023 FINDINGS: Gallbladder: The gallbladder is surgically absent. Common bile duct: Diameter: 6.8 mm Liver: No focal lesion identified. Diffusely increased echogenicity of the liver parenchyma is noted. Portal vein is patent on color Doppler imaging with normal direction of blood flow towards the liver. Other: None. IMPRESSION: 1. Findings consistent with prior cholecystectomy. 2. Hepatic steatosis. Electronically Signed   By: Suzen Dials M.D.   On: 12/09/2023 00:52   MR ABDOMEN MRCP W WO CONTAST Result Date: 11/27/2023 CLINICAL DATA:  Jaundice.  Status post cholecystectomy EXAM: MRI ABDOMEN WITHOUT AND WITH CONTRAST (INCLUDING MRCP) TECHNIQUE: Multiplanar multisequence MR imaging of the abdomen was performed both before and after the administration of intravenous contrast. Heavily T2-weighted images of the biliary and pancreatic ducts were obtained, and three-dimensional MRCP images were rendered by post processing. CONTRAST:  7.72mL GADAVIST  GADOBUTROL  1 MMOL/ML IV SOLN COMPARISON:  CT AP 11/26/2023  FINDINGS: Lower chest: No acute abnormality.  Heart size appears enlarged. Hepatobiliary: No suspicious liver lesion. Status post cholecystectomy. Increase caliber of the common bile duct measures 0.9 cm. The MRCP images are obscured by motion artifact. On the coronal T2 weighted sequences there is suggestion of 2 small stones within the distal CBD which measure up to 3 mm, image 14/3. This cannot be confirmed on either the MRCP images or the axial T2 weighted sequences. Pancreas: No mass, inflammatory changes, or other parenchymal abnormality identified. Spleen:  Atrophic and calcified spleen. Adrenals/Urinary Tract: Signal void artifact from spinal hardware no signs of hydronephrosis. Suggestion of indeterminate upper pole lesion off the lateral cortex of the right kidney measuring 1.7 cm, image 27/16. This appears T1 isointense, limits assessment of the adrenal glands. Cannot assess for underlying enhancement due to artifact created by spinal hardware. No additional focal kidney abnormality noted. Stomach/Bowel: Stomach appears nondistended. No dilated loops of bowel noted. Vascular/Lymphatic: No pathologically enlarged lymph nodes identified. No abdominal aortic aneurysm demonstrated. Other:  No free fluid or fluid collections. Musculoskeletal: No suspicious bone lesions identified. IMPRESSION: 1. Status post cholecystectomy. Increase caliber of the common bile duct measures 0.9 cm. The MRCP images are obscured by motion artifact. On the coronal T2 weighted sequences there is suggestion of 2 small stones within the distal CBD which measure up to 3 mm. This cannot be confirmed on either the MRCP images or the axial T2 weighted sequences. Consider further evaluation with ERCP. 2. Suggestion of indeterminate upper pole lesion off the lateral cortex of the right kidney measuring 1.7 cm. This appears T1 isointense, limits assessment of the adrenal glands. Cannot assess for underlying enhancement due to artifact  created by spinal hardware. Consider further evaluation with renal sonogram. Electronically Signed   By: Waddell Calk M.D.   On: 11/27/2023 13:32   DG Chest 2 View Result Date: 11/26/2023 CLINICAL DATA:  Sickle cell.  Acute chest pain. EXAM: CHEST -  2 VIEW COMPARISON:  Chest x-ray 06/04/2023. CT abdomen and pelvis same day. FINDINGS: The heart is enlarged, unchanged. There is no focal lung infiltrate, pleural effusion or pneumothorax. No acute fractures are seen. Extensive thoracic spinal hardware present. IMPRESSION: Cardiomegaly. No acute cardiopulmonary process. Electronically Signed   By: Greig Pique M.D.   On: 11/26/2023 20:27   CT ABDOMEN PELVIS W CONTRAST Result Date: 11/26/2023 CLINICAL DATA:  Abdominal pain. Recent gallbladder surgery discharge 1 day prior. EXAM: CT ABDOMEN AND PELVIS WITH CONTRAST TECHNIQUE: Multidetector CT imaging of the abdomen and pelvis was performed using the standard protocol following bolus administration of intravenous contrast. RADIATION DOSE REDUCTION: This exam was performed according to the departmental dose-optimization program which includes automated exposure control, adjustment of the mA and/or kV according to patient size and/or use of iterative reconstruction technique. CONTRAST:  OMNIPAQUE  IOHEXOL  300 MG/ML  SOLN COMPARISON:  CT 11/16/2023 FINDINGS: Lower chest: Mild nodular airspace disease in the lateral aspect the RIGHT lower lobe (image 10/series 6. Hepatobiliary: No focal hepatic lesion. Postcholecystectomy. No biliary dilatation. No abnormal fluid collections in the gallbladder fossa. No intrahepatic or extra hepatic biliary duct dilatation. Pancreas: Pancreas is normal. No ductal dilatation. No pancreatic inflammation. Spleen: Hyperdense small spleen suggest also infarction Adrenals/urinary tract: Adrenal glands and kidneys are normal. The ureters and bladder normal. Stomach/Bowel: Stomach, small bowel, appendix, and cecum are normal. The colon  and rectosigmoid colon are normal. Vascular/Lymphatic: Abdominal aorta is normal caliber. No periportal or retroperitoneal adenopathy. No pelvic adenopathy. Reproductive: Prostate unremarkable Other: No free air or abscess in the abdomen pelvis. Musculoskeletal: LEFT hip internal fixation. Posterior lumbar fusion. Diffuse sclerosis. IMPRESSION: 1. Mild nodular airspace disease in the RIGHT lower lobe suggest mild aspiration pneumonitis or pneumonia. 2. Post cholecystectomy. No complicating features. No fluid collections or biliary obstruction. 3. 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 Electronically Signed   By: Jackquline Boxer M.D.   On: 11/26/2023 19:03   US  Abdomen Limited RUQ (LIVER/GB) Result Date: 11/20/2023 CLINICAL DATA:  [id number] Intractable abdominal pain [id number] Nausea AND vomiting [id number] EXAM: ULTRASOUND ABDOMEN LIMITED RIGHT UPPER QUADRANT COMPARISON:  November 16, 2023 FINDINGS: Gallbladder: Multiple layering gallstones. No wall thickening visualized. No sonographic Murphy sign noted by sonographer. Common bile duct: Diameter: Visualized portion measures 7 mm, upper limits of normal. This is similar compared to prior CT. The majority is not visualized. Liver: No focal lesion identified. Within normal limits in parenchymal echogenicity. Portal vein is patent on color Doppler imaging with normal direction of blood flow towards the liver. Other: None. IMPRESSION: Cholelithiasis without sonographic evidence of acute cholecystitis. If there is a persistent clinical concern for biliary obstruction, this would be better assessed with dedicated MRI with MRCP. Electronically Signed   By: Corean Salter M.D.   On: 11/20/2023 07:46   CT ABDOMEN PELVIS W CONTRAST Result Date: 11/16/2023 CLINICAL DATA:  Abdominal pain EXAM: CT ABDOMEN AND PELVIS WITHOUT CONTRAST TECHNIQUE: Multidetector CT imaging of the abdomen and pelvis was performed following the standard protocol without IV  contrast. RADIATION DOSE REDUCTION: This exam was performed according to the departmental dose-optimization program which includes automated exposure control, adjustment of the mA and/or kV according to patient size and/or use of iterative reconstruction technique. COMPARISON:  CT abdomen and pelvis November 06, 2023 FINDINGS: Lower chest: No infiltrates or consolidations, no pleural effusions Hepatobiliary: No change gallstones previously described on prior CT. No focal hepatic lesions or biliary dilatation. The upper abdomen lower chest significantly limited due  to metallic artifact from the thoracolumbar instrumentation. Pancreas: Pancreas normal size. No masses calcifications or inflammatory changes. Spleen: Spleen normal size.  No masses. Adrenals/Urinary Tract: Adrenal glands are normal size. Follow-up recommended. Kidneys are normal. No masses calcifications or hydronephrosis Stomach/Bowel: No small or large bowel obstruction or inflammatory changes. Moderate amount of residual fecal material throughout the colon without obstruction or constipation. Vascular/Lymphatic: No significant vascular findings are present. No enlarged abdominal or pelvic lymph nodes. Reproductive: Prostate is unremarkable. Other: Anterior abdominal wall unremarkable without evidence of umbilical or inguinal hernias Musculoskeletal: Visualized portion of the thoracolumbar spine and pelvic structures grossly unremarkable without evidence of fracture bony abnormalities or soft tissue masses. IMPRESSION: *No acute findings in the abdomen or pelvis. *Cholelithiasis. *Moderate amount of residual fecal material throughout the colon without obstruction or constipation. Electronically Signed   By: Franky Chard M.D.   On: 11/16/2023 11:52    Microbiology: Results for orders placed or performed during the hospital encounter of 11/26/23  Urine Culture (for pregnant, neutropenic or urologic patients or patients with an indwelling urinary  catheter)     Status: None   Collection Time: 11/26/23  6:08 PM   Specimen: Urine, Clean Catch  Result Value Ref Range Status   Specimen Description   Final    URINE, CLEAN CATCH Performed at [facility], [address], [place], [place] [address]    Special Requests   Final    NONE Performed at [facility], [address], [place], [place] [address]    Culture   Final    NO GROWTH Performed at [facility], [address], [place], [place] [address]    Report Status 11/28/2023 FINAL  Final    Labs: CBC: Recent Labs  Lab 12/08/23 2100 12/09/23 0452 12/10/23 1137 12/12/23 0500 12/13/23 0441  WBC 9.9 11.1* 8.4 8.0 9.6  NEUTROABS 5.7 4.5  --  2.5  --   HGB 10.3* 8.4* 8.6* 8.2* 7.9*  HCT 28.6* 24.0* 23.9* 22.2* 21.6*  MCV 100.4* 101.7* 100.0 100.0 99.1  PLT 574* 446* 410* 348 308   Basic Metabolic Panel: Recent Labs  Lab 12/08/23 2024 12/08/23 2100 12/09/23 0452 12/10/23 0424 12/11/23 0437 12/12/23 0500  NA  --  138 138 137 134* 136  K  --  3.6 3.7 4.0 3.7 3.8  CL  --  106 105 103 99 101  CO2  --  22 21* 25 26 26   GLUCOSE  --  111* 106* 104* 99 113*  BUN  --  12 11 10 9 15   CREATININE  --  0.92 0.86 0.91 0.65 0.73  CALCIUM  --  9.6 9.2 9.3 9.2 9.0  MG 2.5*  --  2.1  --   --   --   PHOS 4.8*  --  4.9*  --   --   --    Liver Function Tests: Recent Labs  Lab 12/08/23 2100 12/09/23 0452 12/10/23 0424 12/11/23 0437 12/12/23 0500  AST 105* 83* 72* 60* 45*  ALT 137* 108* 97* 80* 56*  ALKPHOS 141* 115 112 113 109  BILITOT 2.7* 2.3* 2.3* 2.7* 2.1*  PROT 10.0* 7.7 8.1 8.5* 7.9  ALBUMIN 4.7 3.7 3.7 4.1 3.7   CBG: No results for input(s): GLUCAP in the last 168 hours.  Discharge time spent: greater than 30 minutes.  SignedBETHA SIM KNOLL, MD [facility] 12/14/2023

## 2023-12-03 NOTE — Plan of Care (Signed)

## 2023-12-03 NOTE — Plan of Care (Signed)
 Went over discharge instructions with pt. He verbalized understanding. Vitals stable, PIV intact upon removal, MID line removed by IV team, pt going by private vehicle.    Problem: Education: Goal: Knowledge of General Education information will improve Description: Including pain rating scale, medication(s)/side effects and non-pharmacologic comfort measures Outcome: Adequate for Discharge   Problem: Health Behavior/Discharge Planning: Goal: Ability to manage health-related needs will improve Outcome: Adequate for Discharge   Problem: Clinical Measurements: Goal: Ability to maintain clinical measurements within normal limits will improve Outcome: Adequate for Discharge Goal: Will remain free from infection Outcome: Adequate for Discharge Goal: Diagnostic test results will improve Outcome: Adequate for Discharge Goal: Respiratory complications will improve Outcome: Adequate for Discharge Goal: Cardiovascular complication will be avoided Outcome: Adequate for Discharge   Problem: Activity: Goal: Risk for activity intolerance will decrease Outcome: Adequate for Discharge   Problem: Nutrition: Goal: Adequate nutrition will be maintained Outcome: Adequate for Discharge   Problem: Coping: Goal: Level of anxiety will decrease Outcome: Adequate for Discharge   Problem: Elimination: Goal: Will not experience complications related to bowel motility Outcome: Adequate for Discharge Goal: Will not experience complications related to urinary retention Outcome: Adequate for Discharge   Problem: Pain Managment: Goal: General experience of comfort will improve and/or be controlled Outcome: Adequate for Discharge   Problem: Safety: Goal: Ability to remain free from injury will improve Outcome: Adequate for Discharge   Problem: Skin Integrity: Goal: Risk for impaired skin integrity will decrease Outcome: Adequate for Discharge   Problem: Education: Goal: Knowledge of  vaso-occlusive preventative measures will improve Outcome: Adequate for Discharge Goal: Awareness of infection prevention will improve Outcome: Adequate for Discharge Goal: Awareness of signs and symptoms of anemia will improve Outcome: Adequate for Discharge Goal: Long-term complications will improve Outcome: Adequate for Discharge   Problem: Self-Care: Goal: Ability to incorporate actions that prevent/reduce pain crisis will improve Outcome: Adequate for Discharge   Problem: Bowel/Gastric: Goal: Gut motility will be maintained Outcome: Adequate for Discharge   Problem: Tissue Perfusion: Goal: Complications related to inadequate tissue perfusion will be avoided or minimized Outcome: Adequate for Discharge   Problem: Respiratory: Goal: Pulmonary complications will be avoided or minimized Outcome: Adequate for Discharge Goal: Acute Chest Syndrome will be identified early to prevent complications Outcome: Adequate for Discharge   Problem: Fluid Volume: Goal: Ability to maintain a balanced intake and output will improve Outcome: Adequate for Discharge   Problem: Sensory: Goal: Pain level will decrease with appropriate interventions Outcome: Adequate for Discharge   Problem: Health Behavior: Goal: Postive changes in compliance with treatment and prescription regimens will improve Outcome: Adequate for Discharge

## 2023-12-06 LAB — TYPE AND SCREEN
ABO/RH(D): A POS
Antibody Screen: NEGATIVE
Unit division: 0

## 2023-12-06 LAB — BPAM RBC
Blood Product Expiration Date: 202507132359
ISSUE DATE / TIME: 202506271515
Unit Type and Rh: 6200

## 2023-12-08 ENCOUNTER — Emergency Department (HOSPITAL_COMMUNITY)

## 2023-12-08 ENCOUNTER — Inpatient Hospital Stay (HOSPITAL_COMMUNITY)
Admission: EM | Admit: 2023-12-08 | Discharge: 2023-12-13 | DRG: 812 | Disposition: A | Discharge status: Home / Self Care | Attending: Internal Medicine | Admitting: Internal Medicine

## 2023-12-08 ENCOUNTER — Other Ambulatory Visit: Payer: Self-pay

## 2023-12-08 ENCOUNTER — Encounter (HOSPITAL_COMMUNITY): Payer: Self-pay

## 2023-12-08 DIAGNOSIS — Z832 Family history of diseases of the blood and blood-forming organs and certain disorders involving the immune mechanism: Secondary | ICD-10-CM | POA: Diagnosis not present

## 2023-12-08 DIAGNOSIS — D72829 Elevated white blood cell count, unspecified: Secondary | ICD-10-CM | POA: Diagnosis present

## 2023-12-08 DIAGNOSIS — Z833 Family history of diabetes mellitus: Secondary | ICD-10-CM

## 2023-12-08 DIAGNOSIS — I10 Essential (primary) hypertension: Secondary | ICD-10-CM | POA: Diagnosis not present

## 2023-12-08 DIAGNOSIS — K838 Other specified diseases of biliary tract: Secondary | ICD-10-CM | POA: Diagnosis not present

## 2023-12-08 DIAGNOSIS — G894 Chronic pain syndrome: Secondary | ICD-10-CM | POA: Diagnosis present

## 2023-12-08 DIAGNOSIS — Z9049 Acquired absence of other specified parts of digestive tract: Secondary | ICD-10-CM

## 2023-12-08 DIAGNOSIS — Z79899 Other long term (current) drug therapy: Secondary | ICD-10-CM

## 2023-12-08 DIAGNOSIS — R1011 Right upper quadrant pain: Secondary | ICD-10-CM | POA: Diagnosis not present

## 2023-12-08 DIAGNOSIS — Z8249 Family history of ischemic heart disease and other diseases of the circulatory system: Secondary | ICD-10-CM | POA: Diagnosis not present

## 2023-12-08 DIAGNOSIS — D638 Anemia in other chronic diseases classified elsewhere: Secondary | ICD-10-CM | POA: Diagnosis not present

## 2023-12-08 DIAGNOSIS — R1010 Upper abdominal pain, unspecified: Secondary | ICD-10-CM

## 2023-12-08 DIAGNOSIS — K760 Fatty (change of) liver, not elsewhere classified: Secondary | ICD-10-CM | POA: Diagnosis not present

## 2023-12-08 DIAGNOSIS — K8064 Calculus of gallbladder and bile duct with chronic cholecystitis without obstruction: Secondary | ICD-10-CM | POA: Diagnosis not present

## 2023-12-08 DIAGNOSIS — D5700 Hb-SS disease with crisis, unspecified: Secondary | ICD-10-CM | POA: Diagnosis not present

## 2023-12-08 DIAGNOSIS — R9431 Abnormal electrocardiogram [ECG] [EKG]: Secondary | ICD-10-CM | POA: Diagnosis not present

## 2023-12-08 LAB — CBC WITH DIFFERENTIAL/PLATELET
Abs Immature Granulocytes: 0 10*3/uL (ref 0.00–0.07)
Basophils Absolute: 0 10*3/uL (ref 0.0–0.1)
Basophils Relative: 0 %
Eosinophils Absolute: 0.1 10*3/uL (ref 0.0–0.5)
Eosinophils Relative: 1 %
HCT: 28.6 % — ABNORMAL LOW (ref 39.0–52.0)
Hemoglobin: 10.3 g/dL — ABNORMAL LOW (ref 13.0–17.0)
Lymphocytes Relative: 38 %
Lymphs Abs: 3.8 10*3/uL (ref 0.7–4.0)
MCH: 36.1 pg — ABNORMAL HIGH (ref 26.0–34.0)
MCHC: 36 g/dL (ref 30.0–36.0)
MCV: 100.4 fL — ABNORMAL HIGH (ref 80.0–100.0)
Monocytes Absolute: 0.3 10*3/uL (ref 0.1–1.0)
Monocytes Relative: 3 %
Neutro Abs: 5.7 10*3/uL (ref 1.7–7.7)
Neutrophils Relative %: 58 %
Platelets: 574 10*3/uL — ABNORMAL HIGH (ref 150–400)
RBC: 2.85 MIL/uL — ABNORMAL LOW (ref 4.22–5.81)
RDW: 16.7 % — ABNORMAL HIGH (ref 11.5–15.5)
WBC: 9.9 10*3/uL (ref 4.0–10.5)
nRBC: 0.2 % (ref 0.0–0.2)

## 2023-12-08 LAB — COMPREHENSIVE METABOLIC PANEL WITH GFR
ALT: 137 U/L — ABNORMAL HIGH (ref 0–44)
AST: 105 U/L — ABNORMAL HIGH (ref 15–41)
Albumin: 4.7 g/dL (ref 3.5–5.0)
Alkaline Phosphatase: 141 U/L — ABNORMAL HIGH (ref 38–126)
Anion gap: 10 (ref 5–15)
BUN: 12 mg/dL (ref 6–20)
CO2: 22 mmol/L (ref 22–32)
Calcium: 9.6 mg/dL (ref 8.9–10.3)
Chloride: 106 mmol/L (ref 98–111)
Creatinine, Ser: 0.92 mg/dL (ref 0.61–1.24)
GFR, Estimated: >60 mL/min (ref >60)
Glucose, Bld: 111 mg/dL — ABNORMAL HIGH (ref 70–99)
Potassium: 3.6 mmol/L (ref 3.5–5.1)
Sodium: 138 mmol/L (ref 135–145)
Total Bilirubin: 2.7 mg/dL — ABNORMAL HIGH (ref 0.0–1.2)
Total Protein: 10 g/dL — ABNORMAL HIGH (ref 6.5–8.1)

## 2023-12-08 LAB — RETICULOCYTES
Immature Retic Fract: 16.4 % — ABNORMAL HIGH (ref 2.3–15.9)
RBC.: 2.79 MIL/uL — ABNORMAL LOW (ref 4.22–5.81)
Retic Count, Absolute: 31 10*3/uL (ref 19.0–186.0)
Retic Ct Pct: 1.1 % (ref 0.4–3.1)

## 2023-12-08 LAB — LIPASE, BLOOD: Lipase: 40 U/L (ref 11–51)

## 2023-12-08 MED ORDER — LACTATED RINGERS IV BOLUS
500.0000 mL | Freq: Once | INTRAVENOUS | Status: AC
  Administered 2023-12-08: 500 mL via INTRAVENOUS

## 2023-12-08 MED ORDER — ONDANSETRON HCL 4 MG/2ML IJ SOLN
4.0000 mg | Freq: Once | INTRAMUSCULAR | Status: AC
  Administered 2023-12-08: 4 mg via INTRAVENOUS
  Filled 2023-12-08: qty 2

## 2023-12-08 MED ORDER — DEXTROSE-SODIUM CHLORIDE 5-0.45 % IV SOLN
INTRAVENOUS | Status: DC
Start: 2023-12-08 — End: 2023-12-09

## 2023-12-08 MED ORDER — HYDROMORPHONE HCL 1 MG/ML IJ SOLN
0.5000 mg | INTRAMUSCULAR | Status: AC
  Administered 2023-12-08: 0.5 mg via INTRAVENOUS
  Filled 2023-12-08: qty 1

## 2023-12-08 MED ORDER — SODIUM CHLORIDE 0.9 % IV BOLUS
1000.0000 mL | Freq: Once | INTRAVENOUS | Status: AC
  Administered 2023-12-08: 1000 mL via INTRAVENOUS

## 2023-12-08 MED ORDER — HYDROMORPHONE HCL 1 MG/ML IJ SOLN
1.0000 mg | INTRAMUSCULAR | Status: AC
  Administered 2023-12-08: 1 mg via INTRAVENOUS
  Filled 2023-12-08: qty 1

## 2023-12-08 NOTE — ED Provider Notes (Signed)
 [place] EMERGENCY DEPARTMENT AT [facility] Provider Note   CSN: [id number] Arrival date & time: 12/08/23  2003     Patient presents with: Abdominal Pain and Sickle Cell Pain Crisis   Manuel Mcguire is a 33 y.o. male.    Abdominal Pain Sickle Cell Pain Crisis patient presents abdominal pain.  Upper abdomen.  History of sickle cell disease.  Recent cholecystectomy complicated by potential common duct obstruction.  Reviewed GI note.  States there was talk of an ERCP but they did not want to do it.  Has had more pain and nausea vomiting last couple days.  No sick contacts.  Pain is in upper abdomen.  Also hurting in the back.  No fevers.     Prior to Admission medications   Medication Sig Start Date End Date Taking? Authorizing Provider  cholecalciferol  (VITAMIN D3) 25 MCG (1000 UNIT) tablet Take 1,000 Units by mouth daily.    [provider]  folic acid  (FOLVITE ) 1 MG tablet Take 1 tablet (1 mg total) by mouth daily. 10/27/12   Alvia Rosaline LABOR, MD  hydroxyurea  (HYDREA ) 500 MG capsule Take 3 capsules (1,500 mg total) by mouth daily. May take with food to minimize GI side effects. 10/27/12   Alvia Rosaline LABOR, MD  lisinopril  (ZESTRIL ) 2.5 MG tablet Take 2.5 mg by mouth daily.    [provider]  metoCLOPramide  (REGLAN ) 10 MG tablet Take 1 tablet (10 mg total) by mouth 3 (three) times daily with meals. 12/02/23 12/01/24  Ijaola, Onyeje M, NP  ondansetron  (ZOFRAN -ODT) 4 MG disintegrating tablet 4mg  ODT q4 hours prn nausea/vomit 11/06/23   Patt Alm Macho, MD  oxyCODONE  (OXY IR/ROXICODONE ) 5 MG immediate release tablet Take 1 tablet (5 mg total) by mouth every 6 (six) hours as needed for severe pain (pain score 7-10). 12/02/23   Cherylene Homer HERO, NP    Allergies: Patient has no known allergies.    Review of Systems  Gastrointestinal:  Positive for abdominal pain.    Updated Vital Signs BP 130/84   Pulse 91   Temp 98.4 F (36.9 C) (Oral)    Resp 16   Ht 5' 7 (1.702 m)   Wt 71 kg   SpO2 99%   BMI 24.52 kg/m   Physical Exam HENT:     Head: Normocephalic.  Cardiovascular:     Rate and Rhythm: Regular rhythm.  Abdominal:     Tenderness: There is abdominal tenderness.     Comments:   Upper abdominal tenderness without rebound or guarding.  No hernia palpated.  Skin:    Capillary Refill: Capillary refill takes less than 2 seconds.  Neurological:     Mental Status: He is alert and oriented to person, place, and time.     (all labs ordered are listed, but only abnormal results are displayed) Labs Reviewed  COMPREHENSIVE METABOLIC PANEL WITH GFR - Abnormal; Notable for the following components:      Result Value   Glucose, Bld 111 (*)    Total Protein 10.0 (*)    AST 105 (*)    ALT 137 (*)    Alkaline Phosphatase 141 (*)    Total Bilirubin 2.7 (*)    All other components within normal limits  CBC WITH DIFFERENTIAL/PLATELET - Abnormal; Notable for the following components:   RBC 2.85 (*)    Hemoglobin 10.3 (*)    HCT 28.6 (*)    MCV 100.4 (*)    MCH 36.1 (*)  RDW 16.7 (*)    Platelets 574 (*)    All other components within normal limits  RETICULOCYTES - Abnormal; Notable for the following components:   RBC. 2.79 (*)    Immature Retic Fract 16.4 (*)    All other components within normal limits  LIPASE, BLOOD  URINALYSIS, ROUTINE W REFLEX MICROSCOPIC    EKG: None  Radiology: No results found.   Procedures   Medications Ordered in the ED  HYDROmorphone  (DILAUDID ) injection 1 mg (has no administration in time range)  dextrose  5 % and 0.45 % NaCl infusion ( Intravenous New Bag/Given 12/08/23 2143)  sodium chloride  0.9 % bolus 1,000 mL (1,000 mLs Intravenous New Bag/Given 12/08/23 2143)  HYDROmorphone  (DILAUDID ) injection 0.5 mg (0.5 mg Intravenous Given 12/08/23 2145)  HYDROmorphone  (DILAUDID ) injection 1 mg (1 mg Intravenous Given 12/08/23 2230)  lactated ringers  bolus 500 mL (500 mLs Intravenous New  Bag/Given 12/08/23 2144)  ondansetron  (ZOFRAN ) injection 4 mg (4 mg Intravenous Given 12/08/23 2151)                                    Medical Decision Making Amount and/or Complexity of Data Reviewed Labs: ordered. Radiology: ordered.  Risk Prescription drug management.   Patient with abdominal pain.  Upper abdominal pain with nausea and vomiting.  Reviewed GI notes and discharge note.  Said potentially common duct stones of the only.  On certain imaging of the MRI.  Now more vomiting.  Pain has been uncontrolled.   LFTs now increasing again.  Will require admission to the hospital.  Discussed with Dr. Silvester, who will admit patient.     Final diagnoses:  Sickle cell anemia with pain (HCC)  Pain of upper abdomen    ED Discharge Orders     None          Patsey Lot, MD 12/08/23 2239

## 2023-12-08 NOTE — Assessment & Plan Note (Signed)
 Recurrent abdominal pain with elevated LFTs Appreciate GI consult in a.m. sent message to Dr. Kriss for [facility] GI Keep n.p.o. postmidnight in case needs a procedure Elevated LFTs in this patient could be multifactorial combination of dehydration and also known history of sickle cell disease

## 2023-12-08 NOTE — Assessment & Plan Note (Signed)
-   will admit per sickle cell protocol,    control pain,    hydrate with IVF D5 .45% Saline @ 125 mls/hour,    Weight based Dilaudid  PCA for opioid tolerant patients.    continue hydroxyurea  and folic acid    Transfuse as needed if Hg drops significantly below baseline.    No evidence of acute chest at this time   Sickle cell team to take over management in AM

## 2023-12-08 NOTE — ED Triage Notes (Signed)
 Pt reports abd pain and vomiting x1 day. Gallbladder surgery 2 weeks ago. Pt states that he is having sickle cell pain in his lower back r/t not being able to hold down anything to eat/drink.

## 2023-12-08 NOTE — Subjective & Objective (Signed)
 Presents with abd pain and vomiting for 1 day pain in upper abdomen he has known history of sickle cell pain 2 weeks ago undergone cholecystectomy complicated by common duct obstruction continues to have recurrent pain nausea and vomiting recurrent visits to ER unable to tolerate his home medications which has exacerbated his sickle cell pain

## 2023-12-08 NOTE — H&P (Incomplete)
 Manuel Mcguire FMW:[id number] DOB: 1991/04/01 DOA: 12/08/2023     PCP: Irean Karolynn Batters, NP     Patient arrived to ER on 12/08/23 at 2003 Referred by Attending Patsey Lot, MD   Patient coming from:    home Lives With family      Chief Complaint:   Chief Complaint  Patient presents with  . Abdominal Pain  . Sickle Cell Pain Crisis    HPI: Manuel Mcguire is a 33 y.o. male with medical history significant of hypertension, sickle cell disease, cholelithiasis  and choledocholithiasis status post laparoscopic cholecystectomy    Presented with  abd pain  Presents with abd pain and vomiting for 1 day pain in upper abdomen he has known history of sickle cell pain 2 weeks ago undergone cholecystectomy complicated by common duct obstruction continues to have recurrent pain nausea and vomiting recurrent visits to ER unable to tolerate his home medications which has exacerbated his sickle cell pain    MRCP was done and of June showed increased caliber of the common bile duct measuring 0.9 cm but was obscured by motion artifact He may need ERCP in the future at that time was considered  Denies significant ETOH intake   Does not smoke   Denies marijuana use      Regarding pertinent Chronic problems:       HTN on lisinopril    Chronic anemia - baseline hg Hemoglobin & Hematocrit  Recent Labs    12/02/23 0621 12/03/23 0402 12/08/23 2100  HGB 6.6* 8.5* 10.3*   Iron/TIBC/Ferritin/ %Sat    Component Value Date/Time   FERRITIN 241 10/27/2012 1542   Sickle cell disease on folic acid  and hydroxyurea  While in ER:     Noted elevated LFTs right upper quadrant ultrasound ordered patient denies any chest pain    Lab Orders         Lipase, blood         Comprehensive metabolic panel         Urinalysis, Routine w reflex microscopic -Urine, Clean Catch         CBC with Differential         Reticulocytes    Right upper quadrant ultrasound pending  Following  Medications were ordered in ER: Medications  HYDROmorphone  (DILAUDID ) injection 1 mg (has no administration in time range)  dextrose  5 % and 0.45 % NaCl infusion ( Intravenous New Bag/Given 12/08/23 2143)  sodium chloride  0.9 % bolus 1,000 mL (1,000 mLs Intravenous New Bag/Given 12/08/23 2143)  HYDROmorphone  (DILAUDID ) injection 0.5 mg (0.5 mg Intravenous Given 12/08/23 2145)  HYDROmorphone  (DILAUDID ) injection 1 mg (1 mg Intravenous Given 12/08/23 2230)  lactated ringers  bolus 500 mL (500 mLs Intravenous New Bag/Given 12/08/23 2144)  ondansetron  (ZOFRAN ) injection 4 mg (4 mg Intravenous Given 12/08/23 2151)       ED Triage Vitals [12/08/23 2022]  Encounter Vitals Group     BP 130/84     Girls Systolic BP Percentile      Girls Diastolic BP Percentile      Boys Systolic BP Percentile      Boys Diastolic BP Percentile      Pulse Rate 91     Resp 16     Temp 98.4 F (36.9 C)     Temp Source Oral     SpO2 99 %     Weight 156 lb 8.4 oz (71 kg)     Height 5' 7 (1.702 m)     Head  Circumference      Peak Flow      Pain Score 6     Pain Loc      Pain Education      Exclude from Growth Chart   [id number]     _________________________________________ Significant initial  Findings: Abnormal Labs Reviewed  COMPREHENSIVE METABOLIC PANEL WITH GFR - Abnormal; Notable for the following components:      Result Value   Glucose, Bld 111 (*)    Total Protein 10.0 (*)    AST 105 (*)    ALT 137 (*)    Alkaline Phosphatase 141 (*)    Total Bilirubin 2.7 (*)    All other components within normal limits  CBC WITH DIFFERENTIAL/PLATELET - Abnormal; Notable for the following components:   RBC 2.85 (*)    Hemoglobin 10.3 (*)    HCT 28.6 (*)    MCV 100.4 (*)    MCH 36.1 (*)    RDW 16.7 (*)    Platelets 574 (*)    All other components within normal limits  RETICULOCYTES - Abnormal; Notable for the following components:   RBC. 2.79 (*)    Immature Retic Fract 16.4 (*)    All other components within  normal limits    ECG: Ordered Personally reviewed and interpreted by me showing: HR : 84 Rhythm:Sinus rhythm Borderline ST elevation, anterior leads When compared with ECG of 08/26/2023, T wave abnormality has improve QTC 454   The recent clinical data is shown below. Vitals:   12/08/23 2022  BP: 130/84  Pulse: 91  Resp: 16  Temp: 98.4 F (36.9 C)  TempSrc: Oral  SpO2: 99%  Weight: 71 kg  Height: 5' 7 (1.702 m)    WBC     Component Value Date/Time   WBC 9.9 12/08/2023 2100   LYMPHSABS 3.8 12/08/2023 2100   MONOABS 0.3 12/08/2023 2100   EOSABS 0.1 12/08/2023 2100   BASOSABS 0.0 12/08/2023 2100      Results for orders placed or performed during the hospital encounter of 11/26/23  Urine Culture (for pregnant, neutropenic or urologic patients or patients with an indwelling urinary catheter)     Status: None   Collection Time: 11/26/23  6:08 PM   Specimen: Urine, Clean Catch  Result Value Ref Range Status   Specimen Description   Final    URINE, CLEAN CATCH Performed at [facility], [address], [place], [place] [address]    Special Requests   Final    NONE Performed at [facility], [address] [place], [place] [address]    Culture   Final    NO GROWTH Performed at [facility], [address] [place], [place] [address]    Report Status 11/28/2023 FINAL  Final   _________________________________________________________ Recent Labs  Lab 12/02/23 0621 12/08/23 2100  NA 142 138  K 3.4* 3.6  CO2 25 22  GLUCOSE 90 111*  BUN 5* 12  CREATININE 0.69 0.92  CALCIUM 8.3* 9.6    Cr  Up from baseline see below Lab Results  Component Value Date   CREATININE 0.92 12/08/2023   CREATININE 0.69 12/02/2023   CREATININE 0.50 (L) 11/29/2023    Recent Labs  Lab 12/08/23 2100  AST 105*  ALT 137*  ALKPHOS 141*  BILITOT 2.7*  PROT 10.0*  ALBUMIN 4.7   Lab Results  Component Value Date   CALCIUM 9.6  12/08/2023   PHOS 5.0 (H) 11/20/2023  Plt: Lab Results  Component Value Date   PLT 574 (H) 12/08/2023       Recent Labs  Lab 12/02/23 0621 12/03/23 0402 12/08/23 2100  WBC 11.9* 14.8* 9.9  NEUTROABS  --   --  5.7  HGB 6.6* 8.5* 10.3*  HCT 18.6* 23.5* 28.6*  MCV 105.7* 101.3* 100.4*  PLT 488* 603* 574*    HG/HCT Up from baseline see below    Component Value Date/Time   HGB 10.3 (L) 12/08/2023 2100   HCT 28.6 (L) 12/08/2023 2100   MCV 100.4 (H) 12/08/2023 2100    Recent Labs  Lab 12/08/23 2100  LIPASE 40   No results for input(s): AMMONIA in the last 168 hours.    _______________________________________________ Hospitalist was called for admission for   Sickle cell anemia with pain,  transaminitis history choledocholithiasis    The following Work up has been ordered so far:  Orders Placed This Encounter  Procedures  . US  Abdomen Limited RUQ (LIVER/GB)  . Lipase, blood  . Comprehensive metabolic panel  . Urinalysis, Routine w reflex microscopic -Urine, Clean Catch  . CBC with Differential  . Reticulocytes  . Diet NPO time specified  . Monitor O2 SATs  . Document Actual / Estimated Weight  . If O2 sat <94% Administer O2 @ 2 Liters/Minute  . Vital signs with O2 sat, q1hour  . ED Cardiac monitoring  . Weigh patient in Kg  . Saline Lock IV-Maintain IV access  . Patient may eat/drink  . Initiate Carrier Fluid Protocol  . Consult to hospitalist  . Consult to hospitalist  . EKG 12-Lead     Cultures:    Component Value Date/Time   SDES  11/26/2023 1808    URINE, CLEAN CATCH Performed at [facility], [address], [place], [place] [address]    SPECREQUEST  11/26/2023 1808    NONE Performed at [facility], [address], [place], [place] [address]    CULT  11/26/2023 1808    NO GROWTH Performed at [facility], [address] [place], [place] [address]    REPTSTATUS 11/28/2023 FINAL 11/26/2023 1808      Radiological Exams on Admission: No results found. _______________________________________________________________________________________________________ Latest  Blood pressure 130/84, pulse 91, temperature 98.4 F (36.9 C), temperature source Oral, resp. rate 16, height 5' 7 (1.702 m), weight 71 kg, SpO2 99%.   Vitals  labs and radiology finding personally reviewed  Review of Systems:    Pertinent positives include:    abdominal pain, nausea, vomiting, Constitutional:  No weight loss, night sweats, Fevers, chills, fatigue, weight loss  HEENT:  No headaches, Difficulty swallowing,Tooth/dental problems,Sore throat,  No sneezing, itching, ear ache, nasal congestion, post nasal drip,  Cardio-vascular:  No chest pain, Orthopnea, PND, anasarca, dizziness, palpitations.no Bilateral lower extremity swelling  GI:  No heartburn, indigestion, diarrhea, change in bowel habits, loss of appetite, melena, blood in stool, hematemesis Resp:  no shortness of breath at rest. No dyspnea on exertion, No excess mucus, no productive cough, No non-productive cough, No coughing up of blood.No change in color of mucus.No wheezing. Skin:  no rash or lesions. No jaundice GU:  no dysuria, change in color of urine, no urgency or frequency. No straining to urinate.  No flank pain.  Musculoskeletal:  No joint pain or no joint swelling. No decreased range of motion. No back pain.  Psych:  No change in mood or affect. No depression or anxiety. No memory loss.  Neuro: no localizing neurological complaints, no tingling, no weakness, no double vision, no gait abnormality, no slurred speech, no confusion  All systems reviewed and apart from HOPI all are negative _______________________________________________________________________________________________ Past Medical History:   Past Medical History:  Diagnosis Date  . Scoliosis   . Sickle cell anemia with pain [facility])       Past Surgical History:   Procedure Laterality Date  . BACK SURGERY     unaware of particular surgery  . CHOLECYSTECTOMY N/A 11/22/2023   Procedure: LAPAROSCOPIC CHOLECYSTECTOMY WITH INTRAOPERATIVE CHOLANGIOGRAM;  Surgeon: Signe Mitzie LABOR, MD;  Location: [facility] ORS;  Service: General;  Laterality: N/A;  Poss IOC, ICG  . HIP PINNING Left    Pt unaware of particulars    Social History:  Ambulatory   independently      reports that he has never smoked. He does not have any smokeless tobacco history on file. He reports that he does not drink alcohol and does not use drugs.   Family History:   Family History  Problem Relation Age of Onset  . Diabetes Mother   . Hypertension Mother   . Sickle cell anemia Sister    ______________________________________________________________________________________________ Allergies: No Known Allergies   Prior to Admission medications   Medication Sig Start Date End Date Taking? Authorizing Provider  cholecalciferol  (VITAMIN D3) 25 MCG (1000 UNIT) tablet Take 1,000 Units by mouth daily.    [provider]  folic acid  (FOLVITE ) 1 MG tablet Take 1 tablet (1 mg total) by mouth daily. 10/27/12   Alvia Rosaline LABOR, MD  hydroxyurea  (HYDREA ) 500 MG capsule Take 3 capsules (1,500 mg total) by mouth daily. May take with food to minimize GI side effects. 10/27/12   Alvia Rosaline LABOR, MD  lisinopril  (ZESTRIL ) 2.5 MG tablet Take 2.5 mg by mouth daily.    [provider]  metoCLOPramide  (REGLAN ) 10 MG tablet Take 1 tablet (10 mg total) by mouth 3 (three) times daily with meals. 12/02/23 12/01/24  Ijaola, Onyeje M, NP  ondansetron  (ZOFRAN -ODT) 4 MG disintegrating tablet 4mg  ODT q4 hours prn nausea/vomit 11/06/23   Patt Alm Macho, MD  oxyCODONE  (OXY IR/ROXICODONE ) 5 MG immediate release tablet Take 1 tablet (5 mg total) by mouth every 6 (six) hours as needed for severe pain (pain score 7-10). 12/02/23   Cherylene Homer HERO, NP     ___________________________________________________________________________________________________ Physical Exam:    12/08/2023    8:22 PM 12/03/2023    9:46 AM 12/03/2023    4:49 AM  Vitals with BMI  Height 5' 7    Weight 156 lbs 8 oz    BMI 24.51    Systolic 130 133 857  Diastolic 84 80 94  Pulse 91 76 81    1. General:  in No  Acute distress    Chronically ill  -appearing 2. Psychological: Alert and   Oriented 3. Head/ENT:    Dry Mucous Membranes                          Head Non traumatic, neck supple                          Poor Dentition 4. SKIN:  decreased Skin turgor,  Skin clean Dry and intact no rash    5. Heart: Regular rate and rhythm no  Murmur, no Rub or gallop 6. Lungs:  no wheezes or crackles   7. Abdomen: Soft, epigastric  and RUQ-tender, Non distended  bowel sounds diminished 8. Lower extremities: no clubbing, cyanosis, no  edema 9. Neurologically Grossly intact, moving all 4 extremities equally   10. MSK: Normal range of motion    Chart has been reviewed  ______________________________________________________________________________________________  Assessment/Plan 33 y.o. male with medical history significant of hypertension, sickle cell disease, cholelithiasis  and choledocholithiasis status post laparoscopic cholecystectomy     Admitted for  Sickle cell anemia with pain,  transaminitis history choledocholithiasis    Present on Admission: . Sickle cell pain crisis (HCC) . Anemia of chronic disease . 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 sent message to Dr. Kriss for [facility] Keep n.p.o. postmidnight in case needs a procedure Elevated LFTs in this patient could be  multifactorial combination of dehydration and also known history of sickle cell disease  Sickle cell crisis (HCC) - will admit per sickle cell protocol,    control pain,    hydrate with IVF D5 .45% Saline @ 125 mls/hour,    Weight based Dilaudid  PCA for opioid tolerant patients.    continue hydroxyurea  and folic acid    Transfuse as needed if Hg drops significantly below baseline.    No evidence of acute chest at this time   Sickle cell team to take over management in AM    Other plan as per orders.  DVT prophylaxis:  SCD         Code Status:    Code Status: Prior FULL CODE  as per patient   I had personally discussed CODE STATUS with patient   ACP   none     Family Communication:   Family  at  Bedside  plan of care was discussed   with   father,   Diet  Diet Orders (From admission, onward)     Start     Ordered   12/08/23 2024  Diet NPO time specified  Diet effective now        12/08/23 2023            Disposition Plan:       To home once workup is complete and patient is stable   Following barriers for discharge:                                                       Electrolytes corrected                                                             Pain controlled with PO medications                                                       Will need to be able to tolerate PO  Will need consultants to evaluate patient prior to discharge                                Consult Orders  (From admission, onward)           Start     Ordered   12/08/23 2248  Consult to hospitalist  Once       Provider:  (Not yet assigned)  Question Answer Comment  Place call to: [facility]   Reason for Consult Admit      12/08/23 2247             Consults called: Message sent to [facility]  Admission status:  ED Disposition     ED Disposition  Admit   Condition  --   Comment  Hospital Area: [facility] [[id number]]  Level of Care: Med-Surg [16]  May admit patient to [facility] or [facility] if equivalent level of care is available:: No  Covid Evaluation: Asymptomatic - no recent exposure (last 10 days) testing not required  Diagnosis: Sickle cell pain crisis [facility]) [[id number]]  Admitting Physician: Enedelia Martorelli [[id number]]  Attending Physician: Jamon Hayhurst [[id number]]  Certification:: I certify this patient will need inpatient services for at least 2 midnights  Expected Medical Readiness: 12/11/2023           Obs***  ***  inpatient     I Expect 2 midnight stay secondary to severity of patient's current illness need for inpatient interventions justified by the following: ***hemodynamic instability despite optimal treatment (tachycardia *hypotension * tachypnea *hypoxia, hypercapnia) *** Severe lab/radiological/exam abnormalities including:    Sickle cell anemia with pain (HCC) ***  Pain of upper abdomen ***  and extensive comorbidities including: *substance abuse  *Chronic pain *DM2  * CHF * CAD  * COPD/asthma *Morbid Obesity * CKD *dementia *liver disease *history of stroke with residual deficits *  malignancy, * sickle cell disease  History of amputation Chronic anticoagulation  That are currently affecting medical management.   I expect  patient to be hospitalized for 2 midnights requiring inpatient medical care.  Patient is at high risk for adverse outcome (such as loss of life or disability) if not treated.  Indication for inpatient stay as follows:  Severe change from baseline regarding mental status Hemodynamic instability despite maximal medical therapy,  severe pain requiring acute inpatient management,  inability to maintain oral hydration   persistent chest pain despite medical management Need for operative/procedural  intervention New or worsening hypoxia ongoing suicidal ideations   Need for IV antibiotics, IV fluids,, IV  pain medications, IV anticoagulation,  IV rate controling medications, IV antihypertensives need for biPAP Frequent labs    Level of care   *** tele  For 12H 24H     medical floor       progressive     stepdown   tele indefinitely please discontinue once patient no longer qualifies COVID-19 Labs    Critical***  Patient is critically ill due to  hemodynamic instability * respiratory failure *severe sepsis* ongoing chest pain*  They are at high risk for life/limb threatening clinical deterioration requiring frequent reassessment and modifications of care.  Services provided include examination of the patient, review of relevant ancillary tests, prescription of lifesaving therapies, review of medications and prophylactic therapy.  Total critical care time excluding separately billable procedures: 60*  Minutes.    Yusif Gnau 12/08/2023, 10:48 PM ***  [facility]     after 2 AM please page floor coverage   If 7AM-7PM, please contact the day team taking care of the patient using [email or web address]

## 2023-12-08 NOTE — Assessment & Plan Note (Signed)
 Stable in the setting of sickle cell disease

## 2023-12-08 NOTE — H&P (Signed)
 Manuel Mcguire FMW:[id number] DOB: November 11, 1990 DOA: 12/08/2023     PCP: Irean Karolynn Batters, NP     Patient arrived to ER on 12/08/23 at 2003 Referred by Attending Patsey Lot, MD   Patient coming from:    home Lives With family      Chief Complaint:   Chief Complaint  Patient presents with   Abdominal Pain   Sickle Cell Pain Crisis    HPI: Manuel Mcguire is a 34 y.o. male with medical history significant of hypertension, sickle cell disease, cholelithiasis  and choledocholithiasis status post laparoscopic cholecystectomy    Presented with  abd pain  Presents with abd pain and vomiting for 1 day pain in upper abdomen he has known history of sickle cell pain 2 weeks ago undergone cholecystectomy complicated by common duct obstruction continues to have recurrent pain nausea and vomiting recurrent visits to ER unable to tolerate his home medications which has exacerbated his sickle cell pain    MRCP was done and of June showed increased caliber of the common bile duct measuring 0.9 cm but was obscured by motion artifact He may need ERCP in the future at that time was considered  Denies significant ETOH intake   Does not smoke   Denies marijuana use      Regarding pertinent Chronic problems:       HTN on lisinopril    Chronic anemia - baseline hg Hemoglobin & Hematocrit  Recent Labs    12/02/23 0621 12/03/23 0402 12/08/23 2100  HGB 6.6* 8.5* 10.3*   Iron/TIBC/Ferritin/ %Sat    Component Value Date/Time   FERRITIN 241 10/27/2012 1542   Sickle cell disease on folic acid  and hydroxyurea  While in ER:     Noted elevated LFTs right upper quadrant ultrasound ordered patient denies any chest pain    Lab Orders         Lipase, blood         Comprehensive metabolic panel         Urinalysis, Routine w reflex microscopic -Urine, Clean Catch         CBC with Differential         Reticulocytes    Right upper quadrant ultrasound pending  Following  Medications were ordered in ER: Medications  HYDROmorphone  (DILAUDID ) injection 1 mg (has no administration in time range)  dextrose  5 % and 0.45 % NaCl infusion ( Intravenous New Bag/Given 12/08/23 2143)  sodium chloride  0.9 % bolus 1,000 mL (1,000 mLs Intravenous New Bag/Given 12/08/23 2143)  HYDROmorphone  (DILAUDID ) injection 0.5 mg (0.5 mg Intravenous Given 12/08/23 2145)  HYDROmorphone  (DILAUDID ) injection 1 mg (1 mg Intravenous Given 12/08/23 2230)  lactated ringers  bolus 500 mL (500 mLs Intravenous New Bag/Given 12/08/23 2144)  ondansetron  (ZOFRAN ) injection 4 mg (4 mg Intravenous Given 12/08/23 2151)       ED Triage Vitals [12/08/23 2022]  Encounter Vitals Group     BP 130/84     Girls Systolic BP Percentile      Girls Diastolic BP Percentile      Boys Systolic BP Percentile      Boys Diastolic BP Percentile      Pulse Rate 91     Resp 16     Temp 98.4 F (36.9 C)     Temp Source Oral     SpO2 99 %     Weight 156 lb 8.4 oz (71 kg)     Height 5' 7 (1.702 m)     Head  Circumference      Peak Flow      Pain Score 6     Pain Loc      Pain Education      Exclude from Growth Chart   [id number]     _________________________________________ Significant initial  Findings: Abnormal Labs Reviewed  COMPREHENSIVE METABOLIC PANEL WITH GFR - Abnormal; Notable for the following components:      Result Value   Glucose, Bld 111 (*)    Total Protein 10.0 (*)    AST 105 (*)    ALT 137 (*)    Alkaline Phosphatase 141 (*)    Total Bilirubin 2.7 (*)    All other components within normal limits  CBC WITH DIFFERENTIAL/PLATELET - Abnormal; Notable for the following components:   RBC 2.85 (*)    Hemoglobin 10.3 (*)    HCT 28.6 (*)    MCV 100.4 (*)    MCH 36.1 (*)    RDW 16.7 (*)    Platelets 574 (*)    All other components within normal limits  RETICULOCYTES - Abnormal; Notable for the following components:   RBC. 2.79 (*)    Immature Retic Fract 16.4 (*)    All other components within  normal limits    ECG: Ordered Personally reviewed and interpreted by me showing: HR : 84 Rhythm:Sinus rhythm Borderline ST elevation, anterior leads When compared with ECG of 08/26/2023, T wave abnormality has improve QTC 454   The recent clinical data is shown below. Vitals:   12/08/23 2022  BP: 130/84  Pulse: 91  Resp: 16  Temp: 98.4 F (36.9 C)  TempSrc: Oral  SpO2: 99%  Weight: 71 kg  Height: 5' 7 (1.702 m)    WBC     Component Value Date/Time   WBC 9.9 12/08/2023 2100   LYMPHSABS 3.8 12/08/2023 2100   MONOABS 0.3 12/08/2023 2100   EOSABS 0.1 12/08/2023 2100   BASOSABS 0.0 12/08/2023 2100      Results for orders placed or performed during the hospital encounter of 11/26/23  Urine Culture (for pregnant, neutropenic or urologic patients or patients with an indwelling urinary catheter)     Status: None   Collection Time: 11/26/23  6:08 PM   Specimen: Urine, Clean Catch  Result Value Ref Range Status   Specimen Description   Final    URINE, CLEAN CATCH Performed at [facility], [address], [place], [place] [address]    Special Requests   Final    NONE Performed at [facility], [address], [place], [place] [address]    Culture   Final    NO GROWTH Performed at [facility], [address], [place], [place] [address]    Report Status 11/28/2023 FINAL  Final   _________________________________________________________ Recent Labs  Lab 12/02/23 0621 12/08/23 2100  NA 142 138  K 3.4* 3.6  CO2 25 22  GLUCOSE 90 111*  BUN 5* 12  CREATININE 0.69 0.92  CALCIUM 8.3* 9.6    Cr  Up from baseline see below Lab Results  Component Value Date   CREATININE 0.92 12/08/2023   CREATININE 0.69 12/02/2023   CREATININE 0.50 (L) 11/29/2023    Recent Labs  Lab 12/08/23 2100  AST 105*  ALT 137*  ALKPHOS 141*  BILITOT 2.7*  PROT 10.0*  ALBUMIN 4.7   Lab Results  Component Value Date   CALCIUM 9.6  12/08/2023   PHOS 5.0 (H) 11/20/2023  Plt: Lab Results  Component Value Date   PLT 574 (H) 12/08/2023       Recent Labs  Lab 12/02/23 0621 12/03/23 0402 12/08/23 2100  WBC 11.9* 14.8* 9.9  NEUTROABS  --   --  5.7  HGB 6.6* 8.5* 10.3*  HCT 18.6* 23.5* 28.6*  MCV 105.7* 101.3* 100.4*  PLT 488* 603* 574*    HG/HCT Up from baseline see below    Component Value Date/Time   HGB 10.3 (L) 12/08/2023 2100   HCT 28.6 (L) 12/08/2023 2100   MCV 100.4 (H) 12/08/2023 2100    Recent Labs  Lab 12/08/23 2100  LIPASE 40   No results for input(s): AMMONIA in the last 168 hours.    _______________________________________________ Hospitalist was called for admission for   Sickle cell anemia with pain,  transaminitis history choledocholithiasis    The following Work up has been ordered so far:  Orders Placed This Encounter  Procedures   US  Abdomen Limited RUQ (LIVER/GB)   Lipase, blood   Comprehensive metabolic panel   Urinalysis, Routine w reflex microscopic -Urine, Clean Catch   CBC with Differential   Reticulocytes   Diet NPO time specified   Monitor O2 SATs   Document Actual / Estimated Weight   If O2 sat <94% Administer O2 @ 2 Liters/Minute   Vital signs with O2 sat, q1hour   ED Cardiac monitoring   Weigh patient in Kg   Saline Lock IV-Maintain IV access   Patient may eat/drink   Initiate Carrier Fluid Protocol   Consult to hospitalist   Consult to hospitalist   EKG 12-Lead     Cultures:    Component Value Date/Time   SDES  11/26/2023 1808    URINE, CLEAN CATCH Performed at [facility], [address] [place], [place] [address]    SPECREQUEST  11/26/2023 1808    NONE Performed at [facility], [address], [place], [place] [address]    CULT  11/26/2023 1808    NO GROWTH Performed at [facility], [address] [place], [place] [address]    REPTSTATUS 11/28/2023 FINAL 11/26/2023 1808      Radiological Exams on Admission: No results found. _______________________________________________________________________________________________________ Latest  Blood pressure 130/84, pulse 91, temperature 98.4 F (36.9 C), temperature source Oral, resp. rate 16, height 5' 7 (1.702 m), weight 71 kg, SpO2 99%.   Vitals  labs and radiology finding personally reviewed  Review of Systems:    Pertinent positives include:    abdominal pain, nausea, vomiting, Constitutional:  No weight loss, night sweats, Fevers, chills, fatigue, weight loss  HEENT:  No headaches, Difficulty swallowing,Tooth/dental problems,Sore throat,  No sneezing, itching, ear ache, nasal congestion, post nasal drip,  Cardio-vascular:  No chest pain, Orthopnea, PND, anasarca, dizziness, palpitations.no Bilateral lower extremity swelling  GI:  No heartburn, indigestion, diarrhea, change in bowel habits, loss of appetite, melena, blood in stool, hematemesis Resp:  no shortness of breath at rest. No dyspnea on exertion, No excess mucus, no productive cough, No non-productive cough, No coughing up of blood.No change in color of mucus.No wheezing. Skin:  no rash or lesions. No jaundice GU:  no dysuria, change in color of urine, no urgency or frequency. No straining to urinate.  No flank pain.  Musculoskeletal:  No joint pain or no joint swelling. No decreased range of motion. No back pain.  Psych:  No change in mood or affect. No depression or anxiety. No memory loss.  Neuro: no localizing neurological complaints, no tingling, no weakness, no double vision, no gait abnormality, no slurred speech, no confusion  All systems reviewed and apart from HOPI all are negative _______________________________________________________________________________________________ Past Medical History:   Past Medical History:  Diagnosis Date   Scoliosis    Sickle cell anemia with pain (HCC)       Past Surgical History:   Procedure Laterality Date   BACK SURGERY     unaware of particular surgery   CHOLECYSTECTOMY N/A 11/22/2023   Procedure: LAPAROSCOPIC CHOLECYSTECTOMY WITH INTRAOPERATIVE CHOLANGIOGRAM;  Surgeon: Signe Mitzie LABOR, MD;  Location: [facility];  Service: General;  Laterality: N/A;  Poss IOC, ICG   HIP PINNING Left    Pt unaware of particulars    Social History:  Ambulatory   independently      reports that he has never smoked. He does not have any smokeless tobacco history on file. He reports that he does not drink alcohol and does not use drugs.   Family History:   Family History  Problem Relation Age of Onset   Diabetes Mother    Hypertension Mother    Sickle cell anemia Sister    ______________________________________________________________________________________________ Allergies: No Known Allergies   Prior to Admission medications   Medication Sig Start Date End Date Taking? Authorizing Provider  cholecalciferol  (VITAMIN D3) 25 MCG (1000 UNIT) tablet Take 1,000 Units by mouth daily.    [provider]  folic acid  (FOLVITE ) 1 MG tablet Take 1 tablet (1 mg total) by mouth daily. 10/27/12   Alvia Rosaline LABOR, MD  hydroxyurea  (HYDREA ) 500 MG capsule Take 3 capsules (1,500 mg total) by mouth daily. May take with food to minimize GI side effects. 10/27/12   Alvia Rosaline LABOR, MD  lisinopril  (ZESTRIL ) 2.5 MG tablet Take 2.5 mg by mouth daily.    [provider]  metoCLOPramide  (REGLAN ) 10 MG tablet Take 1 tablet (10 mg total) by mouth 3 (three) times daily with meals. 12/02/23 12/01/24  Ijaola, Onyeje M, NP  ondansetron  (ZOFRAN -ODT) 4 MG disintegrating tablet 4mg  ODT q4 hours prn nausea/vomit 11/06/23   Patt Alm Macho, MD  oxyCODONE  (OXY IR/ROXICODONE ) 5 MG immediate release tablet Take 1 tablet (5 mg total) by mouth every 6 (six) hours as needed for severe pain (pain score 7-10). 12/02/23   Cherylene Homer HERO, NP     ___________________________________________________________________________________________________ Physical Exam:    12/08/2023    8:22 PM 12/03/2023    9:46 AM 12/03/2023    4:49 AM  Vitals with BMI  Height 5' 7    Weight 156 lbs 8 oz    BMI 24.51    Systolic 130 133 857  Diastolic 84 80 94  Pulse 91 76 81    1. General:  in No  Acute distress    Chronically ill  -appearing 2. Psychological: Alert and   Oriented 3. Head/ENT:    Dry Mucous Membranes                          Head Non traumatic, neck supple                          Poor Dentition 4. SKIN:  decreased Skin turgor,  Skin clean Dry and intact no rash    5. Heart: Regular rate and rhythm no  Murmur, no Rub or gallop 6. Lungs:  no wheezes or crackles   7. Abdomen: Soft, epigastric  and RUQ-tender, Non distended  bowel sounds diminished 8. Lower extremities: no clubbing, cyanosis, no  edema 9. Neurologically Grossly intact, moving all 4 extremities equally   10. MSK: Normal range of motion    Chart has been reviewed  ______________________________________________________________________________________________  Assessment/Plan 33 y.o. 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 sent message to Dr. Kriss for [facility] Keep n.p.o. postmidnight in case needs a procedure Elevated LFTs in this patient could be  multifactorial combination of dehydration and also known history of sickle cell disease  Sickle cell crisis (HCC) - will admit per sickle cell protocol,    control pain,    hydrate with IVF D5 .45% Saline @ 125 mls/hour,    Weight based Dilaudid  PCA for opioid tolerant patients.    continue hydroxyurea  and folic acid    Transfuse as needed if Hg drops significantly below baseline.    No evidence of acute chest at this time   Sickle cell team to take over management in AM    Other plan as per orders.  DVT prophylaxis:  SCD         Code Status:    Code Status: Prior FULL CODE  as per patient   I had personally discussed CODE STATUS with patient   ACP   none     Family Communication:   Family  at  Bedside  plan of care was discussed   with   father,   Diet  Diet Orders (From admission, onward)     Start     Ordered   12/08/23 2024  Diet NPO time specified  Diet effective now        12/08/23 2023            Disposition Plan:       To home once workup is complete and patient is stable   Following barriers for discharge:                                                       Electrolytes corrected                                                             Pain controlled with PO medications                                                       Will need to be able to tolerate PO  Will need consultants to evaluate patient prior to discharge                                Consult Orders  (From admission, onward)           Start     Ordered   12/08/23 2248  Consult to hospitalist  Once       Provider:  (Not yet assigned)  Question Answer Comment  Place call to: [facility]   Reason for Consult Admit      12/08/23 2247             Consults called: Message sent to [facility]  Admission status:  ED Disposition     ED Disposition  Admit   Condition  --   Comment  Hospital Area: [facility] [[id number]]  Level of Care: Med-Surg [16]  May admit patient to [facility] or [facility] if equivalent level of care is available:: No  Covid Evaluation: Asymptomatic - no recent exposure (last 10 days) testing not required  Diagnosis: Sickle cell pain crisis [facility]) [[id number]]  Admitting Physician: Anjel Perfetti [[id number]]  Attending Physician: Cherisse Carrell [[id number]]  Certification:: I certify this patient will need inpatient services for at least 2 midnights  Expected Medical Readiness: 12/11/2023            inpatient     I Expect 2 midnight stay secondary to severity of patient's current illness need for inpatient interventions justified by the following:     Severe lab/radiological/exam abnormalities including:    Sickle cell anemia with pain (HCC)    and extensive comorbidities including:  Chronic pain  * sickle cell disease   That are currently affecting medical management.   I expect  patient to be hospitalized for 2 midnights requiring inpatient medical care.  Patient is at high risk for adverse outcome (such as loss of life or disability) if not treated.  Indication for inpatient stay as follows:   severe pain requiring acute inpatient management,  inability to maintain oral hydration   Need for operative/procedural  intervention  Need for   IV fluids,, IV pain      Level of care        medical floor      Marsden Zaino 12/09/2023, 12:04 AM    [facility]     after 2 AM please page floor coverage   If 7AM-7PM, please contact the day team taking care of the patient using [email or web address]

## 2023-12-09 ENCOUNTER — Inpatient Hospital Stay (HOSPITAL_COMMUNITY)

## 2023-12-09 DIAGNOSIS — K760 Fatty (change of) liver, not elsewhere classified: Secondary | ICD-10-CM | POA: Diagnosis not present

## 2023-12-09 DIAGNOSIS — R1011 Right upper quadrant pain: Secondary | ICD-10-CM | POA: Diagnosis not present

## 2023-12-09 DIAGNOSIS — R1084 Generalized abdominal pain: Secondary | ICD-10-CM | POA: Diagnosis not present

## 2023-12-09 DIAGNOSIS — R748 Abnormal levels of other serum enzymes: Secondary | ICD-10-CM | POA: Diagnosis not present

## 2023-12-09 DIAGNOSIS — D57219 Sickle-cell/Hb-C disease with crisis, unspecified: Secondary | ICD-10-CM | POA: Diagnosis not present

## 2023-12-09 DIAGNOSIS — Z9049 Acquired absence of other specified parts of digestive tract: Secondary | ICD-10-CM | POA: Diagnosis not present

## 2023-12-09 LAB — COMPREHENSIVE METABOLIC PANEL WITH GFR
ALT: 108 U/L — ABNORMAL HIGH (ref 0–44)
AST: 83 U/L — ABNORMAL HIGH (ref 15–41)
Albumin: 3.7 g/dL (ref 3.5–5.0)
Alkaline Phosphatase: 115 U/L (ref 38–126)
Anion gap: 12 (ref 5–15)
BUN: 11 mg/dL (ref 6–20)
CO2: 21 mmol/L — ABNORMAL LOW (ref 22–32)
Calcium: 9.2 mg/dL (ref 8.9–10.3)
Chloride: 105 mmol/L (ref 98–111)
Creatinine, Ser: 0.86 mg/dL (ref 0.61–1.24)
GFR, Estimated: >60 mL/min (ref >60)
Glucose, Bld: 106 mg/dL — ABNORMAL HIGH (ref 70–99)
Potassium: 3.7 mmol/L (ref 3.5–5.1)
Sodium: 138 mmol/L (ref 135–145)
Total Bilirubin: 2.3 mg/dL — ABNORMAL HIGH (ref 0.0–1.2)
Total Protein: 7.7 g/dL (ref 6.5–8.1)

## 2023-12-09 LAB — CBC WITH DIFFERENTIAL/PLATELET
Abs Immature Granulocytes: 0.04 K/uL (ref 0.00–0.07)
Basophils Absolute: 0.1 K/uL (ref 0.0–0.1)
Basophils Relative: 1 %
Eosinophils Absolute: 0.2 K/uL (ref 0.0–0.5)
Eosinophils Relative: 2 %
HCT: 24 % — ABNORMAL LOW (ref 39.0–52.0)
Hemoglobin: 8.4 g/dL — ABNORMAL LOW (ref 13.0–17.0)
Immature Granulocytes: 0 %
Lymphocytes Relative: 42 %
Lymphs Abs: 4.8 K/uL — ABNORMAL HIGH (ref 0.7–4.0)
MCH: 35.6 pg — ABNORMAL HIGH (ref 26.0–34.0)
MCHC: 35 g/dL (ref 30.0–36.0)
MCV: 101.7 fL — ABNORMAL HIGH (ref 80.0–100.0)
Monocytes Absolute: 1.5 K/uL — ABNORMAL HIGH (ref 0.1–1.0)
Monocytes Relative: 14 %
Neutro Abs: 4.5 K/uL (ref 1.7–7.7)
Neutrophils Relative %: 41 %
Platelets: 446 K/uL — ABNORMAL HIGH (ref 150–400)
RBC: 2.36 MIL/uL — ABNORMAL LOW (ref 4.22–5.81)
RDW: 16.1 % — ABNORMAL HIGH (ref 11.5–15.5)
WBC: 11.1 K/uL — ABNORMAL HIGH (ref 4.0–10.5)
nRBC: 0 % (ref 0.0–0.2)

## 2023-12-09 LAB — URINALYSIS, ROUTINE W REFLEX MICROSCOPIC
Bacteria, UA: NONE SEEN
Bilirubin Urine: NEGATIVE
Glucose, UA: NEGATIVE mg/dL
Hgb urine dipstick: NEGATIVE
Ketones, ur: NEGATIVE mg/dL
Leukocytes,Ua: NEGATIVE
Nitrite: NEGATIVE
Protein, ur: 30 mg/dL — AB
Specific Gravity, Urine: 1.012 (ref 1.005–1.030)
pH: 6 (ref 5.0–8.0)

## 2023-12-09 LAB — PHOSPHORUS
Phosphorus: 4.8 mg/dL — ABNORMAL HIGH (ref 2.5–4.6)
Phosphorus: 4.9 mg/dL — ABNORMAL HIGH (ref 2.5–4.6)

## 2023-12-09 LAB — CK: Total CK: 78 U/L (ref 49–397)

## 2023-12-09 LAB — MAGNESIUM
Magnesium: 2.5 mg/dL — ABNORMAL HIGH (ref 1.7–2.4)
Magnesium: 2.1 mg/dL (ref 1.7–2.4)

## 2023-12-09 LAB — LACTATE DEHYDROGENASE
LDH: 402 U/L — ABNORMAL HIGH (ref 98–192)
LDH: 318 U/L — ABNORMAL HIGH (ref 98–192)

## 2023-12-09 MED ORDER — FOLIC ACID 1 MG PO TABS
1.0000 mg | ORAL_TABLET | Freq: Every day | ORAL | Status: DC
  Administered 2023-12-09 – 2023-12-13 (×4): 1 mg via ORAL
  Filled 2023-12-09 (×4): qty 1

## 2023-12-09 MED ORDER — ONDANSETRON HCL 4 MG/2ML IJ SOLN
4.0000 mg | INTRAMUSCULAR | Status: DC | PRN
  Administered 2023-12-09: 4 mg via INTRAVENOUS
  Filled 2023-12-09: qty 2

## 2023-12-09 MED ORDER — DIPHENHYDRAMINE HCL 25 MG PO CAPS
25.0000 mg | ORAL_CAPSULE | ORAL | Status: DC | PRN
  Administered 2023-12-09 – 2023-12-13 (×6): 25 mg via ORAL
  Filled 2023-12-09 (×6): qty 1

## 2023-12-09 MED ORDER — HYDROXYUREA 500 MG PO CAPS
1500.0000 mg | ORAL_CAPSULE | Freq: Every day | ORAL | Status: DC
  Administered 2023-12-09 – 2023-12-10 (×2): 1500 mg via ORAL
  Filled 2023-12-09 (×3): qty 3

## 2023-12-09 MED ORDER — ONDANSETRON HCL 4 MG/2ML IJ SOLN: 4.0000 mg | Freq: Four times a day (QID) | INTRAMUSCULAR | Status: DC | PRN

## 2023-12-09 MED ORDER — OXYCODONE HCL 5 MG PO TABS
5.0000 mg | ORAL_TABLET | Freq: Four times a day (QID) | ORAL | Status: DC | PRN
  Administered 2023-12-09 – 2023-12-13 (×13): 5 mg via ORAL
  Filled 2023-12-09 (×13): qty 1

## 2023-12-09 MED ORDER — SENNOSIDES-DOCUSATE SODIUM 8.6-50 MG PO TABS
1.0000 | ORAL_TABLET | Freq: Two times a day (BID) | ORAL | Status: DC
  Administered 2023-12-09 – 2023-12-13 (×9): 1 via ORAL
  Filled 2023-12-09 (×9): qty 1

## 2023-12-09 MED ORDER — NALOXONE HCL 0.4 MG/ML IJ SOLN: 0.4000 mg | INTRAMUSCULAR | Status: DC | PRN

## 2023-12-09 MED ORDER — ONDANSETRON HCL 4 MG PO TABS
4.0000 mg | ORAL_TABLET | ORAL | Status: DC | PRN
Start: 2023-12-09 — End: 2023-12-13
  Administered 2023-12-09: 4 mg via ORAL
  Filled 2023-12-09: qty 1

## 2023-12-09 MED ORDER — SODIUM CHLORIDE 0.9% FLUSH: 9.0000 mL | INTRAVENOUS | Status: DC | PRN

## 2023-12-09 MED ORDER — HYDROXYZINE HCL 25 MG PO TABS: 25.0000 mg | ORAL_TABLET | ORAL | Status: DC | PRN

## 2023-12-09 MED ORDER — HYDROMORPHONE 1 MG/ML IV SOLN
INTRAVENOUS | Status: DC
  Administered 2023-12-09: 30 mg via INTRAVENOUS
  Administered 2023-12-09: 4.5 mg via INTRAVENOUS
  Administered 2023-12-09: 5 mg via INTRAVENOUS
  Administered 2023-12-09 – 2023-12-10 (×2): 30 mg via INTRAVENOUS
  Administered 2023-12-10: 4 mg via INTRAVENOUS
  Administered 2023-12-10: 4.5 mg via INTRAVENOUS
  Administered 2023-12-11: 12.5 mg via INTRAVENOUS
  Administered 2023-12-11: 30 mg via INTRAVENOUS
  Administered 2023-12-11: 7.5 mg via INTRAVENOUS
  Administered 2023-12-11: 8.5 mg via INTRAVENOUS
  Administered 2023-12-12: 2 mg via INTRAVENOUS
  Administered 2023-12-12: 3 mg via INTRAVENOUS
  Administered 2023-12-12: 0.5 mg via INTRAVENOUS
  Administered 2023-12-12: 6 mg via INTRAVENOUS
  Administered 2023-12-12: 4.5 mg via INTRAVENOUS
  Administered 2023-12-13: 30 mg via INTRAVENOUS
  Administered 2023-12-13: 6 mg via INTRAVENOUS
  Administered 2023-12-13: 9 mg via INTRAVENOUS
  Administered 2023-12-13: 5 mg via INTRAVENOUS
  Filled 2023-12-09 (×5): qty 30

## 2023-12-09 MED ORDER — DEXTROSE-SODIUM CHLORIDE 5-0.45 % IV SOLN: INTRAVENOUS | Status: AC

## 2023-12-09 NOTE — Consult Note (Signed)
 Referring Provider: Dr. Patsey Primary Care Physician:  Dep, Karolynn Batters, NP Primary Gastroenterologist:  Sampson  Reason for Consultation:  Abdominal pain/nausea/vomiting  HPI: Manuel Mcguire is a 33 y.o. male with history of sickle cell disease and sickle cell pain crisis recently admitted last month with cholecystitis and status post lap chole followed by an MRCP showing 2 small choledocholithiasis with normal LFTs.  He was discharged on 6/28 and presented yesterday with 1 day of epigastric pain and profuse nausea and vomiting.  He is currently very lethargic on PCA pump and gives minimal history.  His father is in the room and helps with the history.  Nursing staff in room.  Ultrasound today showing findings consistent with prior cholecystectomy and hepatic steatosis.  T. bili 2.7, ALP 141, AST 105, ALT 137 on 7/3.  Past Medical History:  Diagnosis Date   Scoliosis    Sickle cell anemia with pain (HCC)     Past Surgical History:  Procedure Laterality Date   BACK SURGERY     unaware of particular surgery   CHOLECYSTECTOMY N/A 11/22/2023   Procedure: LAPAROSCOPIC CHOLECYSTECTOMY WITH INTRAOPERATIVE CHOLANGIOGRAM;  Surgeon: Signe Mitzie LABOR, MD;  Location: [facility];  Service: General;  Laterality: N/A;  Poss IOC, ICG   HIP PINNING Left    Pt unaware of particulars    Prior to Admission medications   Medication Sig Start Date End Date Taking? Authorizing Provider  cholecalciferol  (VITAMIN D3) 25 MCG (1000 UNIT) tablet Take 1,000 Units by mouth daily.   Yes [provider]  folic acid  (FOLVITE ) 1 MG tablet Take 1 tablet (1 mg total) by mouth daily. 10/27/12  Yes Alvia Rosaline LABOR, MD  hydroxyurea  (HYDREA ) 500 MG capsule Take 3 capsules (1,500 mg total) by mouth daily. May take with food to minimize GI side effects. 10/27/12  Yes Alvia Rosaline LABOR, MD  lisinopril  (ZESTRIL ) 2.5 MG tablet Take 2.5 mg by mouth daily.   Yes [provider]  metoCLOPramide   (REGLAN ) 10 MG tablet Take 1 tablet (10 mg total) by mouth 3 (three) times daily with meals. Patient taking differently: Take 10 mg by mouth 3 (three) times daily as needed for nausea or vomiting. 12/02/23 12/01/24 Yes Cherylene Homer HERO, NP  ondansetron  (ZOFRAN -ODT) 4 MG disintegrating tablet 4mg  ODT q4 hours prn nausea/vomit 11/06/23  Yes Patt Alm Macho, MD  oxyCODONE  (OXY IR/ROXICODONE ) 5 MG immediate release tablet Take 1 tablet (5 mg total) by mouth every 6 (six) hours as needed for severe pain (pain score 7-10). 12/02/23  Yes Ijaola, Onyeje M, NP  pseudoephedrine (SUDAFED) 120 MG 12 hr tablet Take 120 mg by mouth every 12 (twelve) hours as needed for congestion.   Yes [provider]    Scheduled Meds:  folic acid   1 mg Oral Daily   HYDROmorphone    Intravenous Q4H   hydroxyurea   1,500 mg Oral Daily   senna-docusate  1 tablet Oral BID   Continuous Infusions:  dextrose  5 % and 0.45 % NaCl 125 mL/hr at 12/09/23 0627   PRN Meds:.diphenhydrAMINE , hydrOXYzine , naloxone  **AND** sodium chloride  flush, ondansetron  **OR** ondansetron  (ZOFRAN ) IV  Allergies as of 12/08/2023   (No Known Allergies)    Family History  Problem Relation Age of Onset   Diabetes Mother    Hypertension Mother    Sickle cell anemia Sister     Social History   Socioeconomic History   Marital status: Single    Spouse name: Not on file   Number of children:  Not on file   Years of education: Not on file   Highest education level: Not on file  Occupational History   Not on file  Tobacco Use   Smoking status: Never   Smokeless tobacco: Not on file  Substance and Sexual Activity   Alcohol use: No   Drug use: No   Sexual activity: Yes  Other Topics Concern   Not on file  Social History Narrative   Not on file   Social Drivers of Health   Financial Resource Strain: Not on file  Food Insecurity: No Food Insecurity (12/09/2023)   Hunger Vital Sign    Worried About Running Out of Food in the Last  Year: Never true    Ran Out of Food in the Last Year: Never true  Transportation Needs: No Transportation Needs (12/09/2023)   PRAPARE - Administrator, Civil Service (Medical): No    Lack of Transportation (Non-Medical): No  Physical Activity: Not on file  Stress: Not on file  Social Connections: Unknown (11/27/2023)   Social Connection and Isolation Panel    Frequency of Communication with Friends and Family: More than three times a week    Frequency of Social Gatherings with Friends and Family: Once a week    Attends Religious Services: More than 4 times per year    Active Member of [employer] or Organizations: No    Attends Engineer, structural: Not on file    Marital Status: Not on file  Recent Concern: Social Connections - Moderately Isolated (11/20/2023)   Social Connection and Isolation Panel    Frequency of Communication with Friends and Family: More than three times a week    Frequency of Social Gatherings with Friends and Family: Once a week    Attends Religious Services: More than 4 times per year    Active Member of [employer] or Organizations: No    Attends Engineer, structural: Not on file    Marital Status: Never married  Intimate Partner Violence: Not At Risk (12/09/2023)   Humiliation, Afraid, Rape, and Kick questionnaire    Fear of Current or Ex-Partner: No    Emotionally Abused: No    Physically Abused: No    Sexually Abused: No    Review of Systems: All negative except as stated above in HPI.  Physical Exam: Vital signs: Vitals:   12/09/23 1311 12/09/23 1317  BP:  (!) 119/90  Pulse:  72  Resp: 17   Temp:  98.2 F (36.8 C)  SpO2:  100%   Last BM Date : 12/08/23 General: Lethargic, mild acute distress, well-nourished  Head: normocephalic, atraumatic Eyes: anicteric sclera ENT: oropharynx clear Neck: supple, nontender Lungs:  Clear throughout to auscultation.   No wheezes, crackles, or rhonchi. No acute distress. Heart:  Regular rate  and rhythm; no murmurs, clicks, rubs,  or gallops. Abdomen: Epigastric tenderness with guarding, otherwise nontender, soft, nondistended, positive bowel sounds Rectal:  Deferred Ext: no edema  GI:  Lab Results: Recent Labs    12/08/23 2100 12/09/23 0452  WBC 9.9 11.1*  HGB 10.3* 8.4*  HCT 28.6* 24.0*  PLT 574* 446*   BMET Recent Labs    12/08/23 2100 12/09/23 0452  NA 138 138  K 3.6 3.7  CL 106 105  CO2 22 21*  GLUCOSE 111* 106*  BUN 12 11  CREATININE 0.92 0.86  CALCIUM 9.6 9.2   LFT Recent Labs    12/09/23 0452  PROT 7.7  ALBUMIN 3.7  AST 83*  ALT 108*  ALKPHOS 115  BILITOT 2.3*   PT/INR No results for input(s): LABPROT, INR in the last 72 hours.   Studies/Results:  Impression/Plan: Epigastric pain with nausea and vomiting in setting of sickle cell disease who had question of 2 small common bile duct stones on MRCP last month following lap chole.  Mild increase in LFTs with improvement in levels today compared with admission yesterday.  I am not convinced his symptoms are due to these small stones seen on MRCP last month.  Currently, he is too lethargic on a PCA pump to hold his breath adequately for repeat MRCP and since his liver function tests are improving will hold off on additional imaging.  If LFTs worsen tomorrow then will need to do updated imaging with MRCP or CT scan to further evaluate his biliary tree.  I do not think he needs an ERCP at this time.  Clear liquid diet.  Supportive care.   LOS: 1 day   [name]  12/09/2023, 1:46 PM  Questions please call [phone]

## 2023-12-09 NOTE — Plan of Care (Signed)

## 2023-12-09 NOTE — Progress Notes (Signed)
 Hydroxyurea  (Hydrea ) hold criteria in sickle cell disease: ANC < 2K Pltc < 80K  Hgb <= 6 g/dL Reticulocytes < 19X when Hgb < 9 g/dL  7/4: Hold hydroxyurea  for Retics <80 = 31 with Hgb drop to 8.4 per policy.  Manuel Mcguire Manuel Mcguire, PharmD, BCPS Clinical Staff Pharmacist

## 2023-12-09 NOTE — Progress Notes (Signed)
 Patient ID: Manuel Mcguire, male   DOB: 09-Apr-1991, 33 y.o.   MRN: [id number] Subjective: Manuel Mcguire is a 33 year old male with a medical history significant for sickle cell disease was admitted for abdominal pain, nausea, vomiting with sickle cell pain crisis.     Patient reports pain of 6/10 today ongoing nausea and abdominal pain. He denies vomiting, fever, headache, SOB, cough. No urinary symptoms.  Objective:  Vital signs in last 24 hours:  Vitals:   12/09/23 2012 12/09/23 2127 12/09/23 2328 12/09/23 2331  BP:  117/89    Pulse:  69    Resp: 14 18 19 19   Temp:  97.7 F (36.5 C)    TempSrc:  Oral    SpO2: 96% 100% 91%   Weight:      Height:        Intake/Output from previous day:   Intake/Output Summary (Last 24 hours) at 12/09/2023 2345 Last data filed at 12/09/2023 2127 Gross per 24 hour  Intake 1763.05 ml  Output 1900 ml  Net -136.95 ml    Physical Exam: General: Alert, awake, oriented x3, in no acute distress.  HEENT: [place]/AT PEERL, EOMI Neck: Trachea midline,  no masses, no thyromegal,y no JVD, no carotid bruit OROPHARYNX:  Moist, No exudate/ erythema/lesions.  Heart: Regular rate and rhythm, without murmurs, rubs, gallops, PMI non-displaced, no heaves or thrills on palpation.  Lungs: Clear to auscultation, no wheezing or rhonchi noted. No increased vocal fremitus resonant to percussion  Abdomen: Soft, nontender, nondistended, positive bowel sounds, no masses no hepatosplenomegaly noted..  Neuro: No focal neurological deficits noted cranial nerves II through XII grossly intact. DTRs 2+ bilaterally upper and lower extremities. Strength 5 out of 5 in bilateral upper and lower extremities. Musculoskeletal: No warm swelling or erythema around joints, no spinal tenderness noted. Psychiatric: Patient alert and oriented x3, good insight and cognition, good recent to remote recall. Lymph node survey: No cervical axillary or inguinal lymphadenopathy noted.  Lab  Results:  Basic Metabolic Panel:    Component Value Date/Time   NA 138 12/09/2023 0452   K 3.7 12/09/2023 0452   CL 105 12/09/2023 0452   CO2 21 (L) 12/09/2023 0452   BUN 11 12/09/2023 0452   CREATININE 0.86 12/09/2023 0452   GLUCOSE 106 (H) 12/09/2023 0452   CALCIUM 9.2 12/09/2023 0452   CBC:    Component Value Date/Time   WBC 11.1 (H) 12/09/2023 0452   HGB 8.4 (L) 12/09/2023 0452   HCT 24.0 (L) 12/09/2023 0452   PLT 446 (H) 12/09/2023 0452   MCV 101.7 (H) 12/09/2023 0452   NEUTROABS 4.5 12/09/2023 0452   LYMPHSABS 4.8 (H) 12/09/2023 0452   MONOABS 1.5 (H) 12/09/2023 0452   EOSABS 0.2 12/09/2023 0452   BASOSABS 0.1 12/09/2023 0452    No results found for this or any previous visit (from the past 240 hours).  Studies/Results: US  Abdomen Limited RUQ (LIVER/GB) Result Date: 12/09/2023 CLINICAL DATA:  History of prior cholecystectomy on November 22, 2023, presenting with right upper quadrant pain. EXAM: ULTRASOUND ABDOMEN LIMITED RIGHT UPPER QUADRANT COMPARISON:  November 20, 2023 FINDINGS: Gallbladder: The gallbladder is surgically absent. Common bile duct: Diameter: 6.8 mm Liver: No focal lesion identified. Diffusely increased echogenicity of the liver parenchyma is noted. Portal vein is patent on color Doppler imaging with normal direction of blood flow towards the liver. Other: None. IMPRESSION: 1. Findings consistent with prior cholecystectomy. 2. Hepatic steatosis. Electronically Signed   By: Suzen Dwane HERO.D.  On: 12/09/2023 00:52    Medications: Scheduled Meds:  folic acid   1 mg Oral Daily   HYDROmorphone    Intravenous Q4H   hydroxyurea   1,500 mg Oral Daily   senna-docusate  1 tablet Oral BID   Continuous Infusions:  dextrose  5 % and 0.45 % NaCl 125 mL/hr at 12/09/23 2138   PRN Meds:.diphenhydrAMINE , hydrOXYzine , naloxone  **AND** sodium chloride  flush, ondansetron  **OR** ondansetron  (ZOFRAN ) IV,  oxyCODONE   Consultants: GI  Procedures: None  Antibiotics: None  Assessment/Plan: Principal Problem:   Sickle cell pain crisis (HCC) Active Problems:   Sickle cell crisis (HCC)   Anemia of chronic disease   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   Nausea and Vomiting: Unresolved, NPO status for GI consult , evaluation and management. Last MRCP shows common bile duct of 0.9 cm, the goal is to complete ERCP per GI. Continue Zofran  as prescribed.  Hb Sickle Cell Disease with Pain crisis: Continue IVF 0.45% Saline @ 125 mls/hour, continue weight based Dilaudid  PCA, IV Toradol  15 mg Q 6 H for a total of 5 days, continue oral home pain medications as ordered. Monitor vitals very closely, Re-evaluate pain scale regularly, 2 L of Oxygen by [place]. Patient encouraged to ambulate on the hallway today.  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:S/P Laparoscopic Cholecystectomy with near-infrared fluorescent cholangiography procedure completed on 11/22/2023, patient tolerated procedure well without complication.  Leukocytosis: slightly elevated due to vaso-occlusive crisis, no acute s/s of infection. Anemia of Chronic Disease: HGB stable within patients baseline, will continue to monitor daily cbc Chronic pain Syndrome: Continue oral home pain medication   Code Status: Full Code Family Communication: N/A Disposition Plan: Not yet ready for discharge  Manuel Mcguire Cover NP  If 7PM-7AM, please contact night-coverage.  12/09/2023, 11:45 PM  LOS: 1 day

## 2023-12-10 DIAGNOSIS — R1084 Generalized abdominal pain: Secondary | ICD-10-CM | POA: Diagnosis not present

## 2023-12-10 DIAGNOSIS — D57219 Sickle-cell/Hb-C disease with crisis, unspecified: Secondary | ICD-10-CM | POA: Diagnosis not present

## 2023-12-10 DIAGNOSIS — D5700 Hb-SS disease with crisis, unspecified: Secondary | ICD-10-CM

## 2023-12-10 DIAGNOSIS — R1010 Upper abdominal pain, unspecified: Secondary | ICD-10-CM

## 2023-12-10 DIAGNOSIS — R748 Abnormal levels of other serum enzymes: Secondary | ICD-10-CM | POA: Diagnosis not present

## 2023-12-10 LAB — COMPREHENSIVE METABOLIC PANEL WITH GFR
ALT: 97 U/L — ABNORMAL HIGH (ref 0–44)
AST: 72 U/L — ABNORMAL HIGH (ref 15–41)
Albumin: 3.7 g/dL (ref 3.5–5.0)
Alkaline Phosphatase: 112 U/L (ref 38–126)
Anion gap: 9 (ref 5–15)
BUN: 10 mg/dL (ref 6–20)
CO2: 25 mmol/L (ref 22–32)
Calcium: 9.3 mg/dL (ref 8.9–10.3)
Chloride: 103 mmol/L (ref 98–111)
Creatinine, Ser: 0.91 mg/dL (ref 0.61–1.24)
GFR, Estimated: >60 mL/min (ref >60)
Glucose, Bld: 104 mg/dL — ABNORMAL HIGH (ref 70–99)
Potassium: 4 mmol/L (ref 3.5–5.1)
Sodium: 137 mmol/L (ref 135–145)
Total Bilirubin: 2.3 mg/dL — ABNORMAL HIGH (ref 0.0–1.2)
Total Protein: 8.1 g/dL (ref 6.5–8.1)

## 2023-12-10 LAB — CBC
HCT: 23.9 % — ABNORMAL LOW (ref 39.0–52.0)
Hemoglobin: 8.6 g/dL — ABNORMAL LOW (ref 13.0–17.0)
MCH: 36 pg — ABNORMAL HIGH (ref 26.0–34.0)
MCHC: 36 g/dL (ref 30.0–36.0)
MCV: 100 fL (ref 80.0–100.0)
Platelets: 410 K/uL — ABNORMAL HIGH (ref 150–400)
RBC: 2.39 MIL/uL — ABNORMAL LOW (ref 4.22–5.81)
RDW: 16 % — ABNORMAL HIGH (ref 11.5–15.5)
WBC: 8.4 K/uL (ref 4.0–10.5)
nRBC: 0.5 % — ABNORMAL HIGH (ref 0.0–0.2)

## 2023-12-10 MED ORDER — DEXTROSE-SODIUM CHLORIDE 5-0.45 % IV SOLN: INTRAVENOUS | Status: AC

## 2023-12-10 NOTE — Progress Notes (Signed)
 [facility] Gastroenterology Progress Note  Manuel Mcguire 33 y.o. 05/07/1991   Subjective: Abdominal pain is similar to yesterday.  Tolerating clear liquids without nausea or vomiting.  Nursing in room.  Objective: Vital signs: Vitals:   12/10/23 0804 12/10/23 0937  BP:  130/86  Pulse:  68  Resp: 12   Temp:  97.9 F (36.6 C)  SpO2:  100%    Physical Exam: Gen: Lethargic, well-nourished, no acute distress  HEENT: anicteric sclera CV: RRR Chest: CTA B Abd: Epigastric tenderness with guarding, soft, nondistended, positive bowel sounds Ext: no edema  Lab Results: Recent Labs    12/08/23 2024 12/08/23 2100 12/09/23 0452 12/10/23 0424  NA  --    < > 138 137  K  --    < > 3.7 4.0  CL  --    < > 105 103  CO2  --    < > 21* 25  GLUCOSE  --    < > 106* 104*  BUN  --    < > 11 10  CREATININE  --    < > 0.86 0.91  CALCIUM  --    < > 9.2 9.3  MG 2.5*  --  2.1  --   PHOS 4.8*  --  4.9*  --    < > = values in this interval not displayed.   Recent Labs    12/09/23 0452 12/10/23 0424  AST 83* 72*  ALT 108* 97*  ALKPHOS 115 112  BILITOT 2.3* 2.3*  PROT 7.7 8.1  ALBUMIN 3.7 3.7   Recent Labs    12/08/23 2100 12/09/23 0452  WBC 9.9 11.1*  NEUTROABS 5.7 4.5  HGB 10.3* 8.4*  HCT 28.6* 24.0*  MCV 100.4* 101.7*  PLT 574* 446*      Assessment/Plan: Epigastric pain with nausea vomiting along with elevated liver enzymes that are improving.  CBC ordered. Doubt he has any obstructive extrahepatic biliary process.  Will do an updated MRCP tomorrow to reevaluate for the question of 2 small stones that were seen in June. Full liquid diet today and NPO p MN for MRCP tomorrow. Continue supportive care.   Manuel Mcguire 12/10/2023, 10:50 AM  Questions please call [phone] ID: Manuel Mcguire, male   DOB: 10/28/1990, 33 y.o.   MRN: [id number]

## 2023-12-10 NOTE — TOC Initial Note (Signed)
 Transition of Care [facility]) - Initial/Assessment Note    Patient Details  Name: Manuel Mcguire MRN: [id number] Date of Birth: 04-25-1991  [facility]) CM/SW Contact:    Sonda Manuella Quill, RN Phone Number: 12/10/2023, 4:11 PM  Clinical Narrative:                 Pt asleep; spoke w/f ather Clem Batter at bedside; he says pt lives at home; he plans for pt to return at d/c; pt's father will provide transportation; he verified insurance/PCP; Mr Catalfamo denied pt experiencing SDOH risks; pt does not have DME, HH services, or home oxygen; TOC will follow.  Expected Discharge Plan: Home/Self Care Barriers to Discharge: Continued Medical Work up   Patient Goals and CMS Choice Patient states their goals for this hospitalization and ongoing recovery are:: patient's father says he will return home          Expected Discharge Plan and Services   Discharge Planning Services: CM Consult   Living arrangements for the past 2 months: Single Family Home                                      Prior Living Arrangements/Services Living arrangements for the past 2 months: Single Family Home Lives with:: Parents Patient language and need for interpreter reviewed:: Yes Do you feel safe going back to the place where you live?: Yes      Need for Family Participation in Patient Care: Yes (Comment) Care giver support system in place?: Yes (comment) Current home services:  (n/a) Criminal Activity/Legal Involvement Pertinent to Current Situation/Hospitalization: No - Comment as needed  Activities of Daily Living   ADL Screening (condition at time of admission) Independently performs ADLs?: Yes (appropriate for developmental age) Is the patient deaf or have difficulty hearing?: No Does the patient have difficulty seeing, even when wearing glasses/contacts?: No Does the patient have difficulty concentrating, remembering, or making decisions?: No  Permission  Sought/Granted Permission sought to share information with : Case Manager Permission granted to share information with : Yes, Verbal Permission Granted  Share Information with NAME: Case Manager     Permission granted to share info w Relationship: Sabastien Tyler (father) [phone]     Emotional Assessment Appearance:: Appears stated age Attitude/Demeanor/Rapport: Unable to Assess Affect (typically observed): Unable to Assess Orientation: :  (unable to assess) Alcohol / Substance Use: Not Applicable Psych Involvement: No (comment)  Admission diagnosis:  Sickle cell anemia with pain (HCC) [D57.00] Pain of upper abdomen [R10.10] Sickle cell pain crisis (HCC) [D57.00] Patient Active Problem List   Diagnosis Date Noted   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 11/30/2023   Abdominal pain 11/26/2023   Cholelithiasis 11/25/2023   Constipation 11/23/2023   Pruritus 11/18/2023   Sickle cell anemia with pain (HCC) 11/16/2023   Anemia of chronic disease 11/16/2023   Leukocytosis 11/16/2023   Chronic pain syndrome 11/16/2023   Nausea vomiting and diarrhea 08/26/2023   Sickle cell pain crisis (HCC) 02/05/2023   Acute respiratory failure with hypoxia (HCC) 02/05/2023   CAP (community acquired pneumonia) 02/05/2023   Sickle cell crisis (HCC) 10/27/2012   PCP:  Irean Karolynn Batters, NP Pharmacy:   [facility] [phone] [place], [place] - [address] [place] [address] Phone: [phone] Fax: [phone]  [id number] - [place], [place] - [address] [place]  [address] Phone: [phone] Fax: [phone]     Social Drivers of Health (SDOH) Social History: SDOH Screenings   Food Insecurity: No Food Insecurity (12/10/2023)  Housing: Low Risk  (12/10/2023)  Recent Concern: Housing - High Risk (12/09/2023)  Transportation Needs: No Transportation Needs (12/09/2023)  Utilities: Not  At Risk (12/10/2023)  Recent Concern: Utilities - At Risk (12/09/2023)  Social Connections: Unknown (11/27/2023)  Recent Concern: Social Connections - Moderately Isolated (11/20/2023)  Tobacco Use: Unknown (12/08/2023)   SDOH Interventions: Food Insecurity Interventions: Intervention Not Indicated, Inpatient TOC Housing Interventions: Intervention Not Indicated, Inpatient TOC Utilities Interventions: Intervention Not Indicated, Inpatient TOC   Readmission Risk Interventions    12/10/2023    4:08 PM 11/29/2023    9:39 AM 11/20/2023    9:55 AM  Readmission Risk Prevention Plan  Transportation Screening Complete Complete Complete  PCP or Specialist Appt within 5-7 Days  Complete Complete  PCP or Specialist Appt within 3-5 Days Complete    Home Care Screening  Complete Complete  Medication Review (RN CM)  Complete Complete  HRI or Home Care Consult Complete    Social Work Consult for Recovery Care Planning/Counseling Complete    Palliative Care Screening Not Applicable    Medication Review Oceanographer) Complete

## 2023-12-10 NOTE — Plan of Care (Signed)

## 2023-12-10 NOTE — Progress Notes (Signed)
 Patient ID: Manuel Mcguire, male   DOB: 1990-10-29, 33 y.o.   MRN: [id number] Subjective: Manuel Mcguire is a 33 year old male with a medical history significant for sickle cell disease was admitted for abdominal pain, nausea, vomiting in the setting of sickle cell pain crisis.    Patient has no new complaints today.  He said his pain is much improved and his nausea has actually stopped.  But he is concerned that this is what happens every time he is admitted, symptoms usually resolved but then nausea and vomiting starts few days after.  He has tried everything including dietary modification but to no avail.  Right now he has no nausea or vomiting, no fever, no headache.  Abdominal pain is minimal.  No urinary symptoms. Objective:  Vital signs in last 24 hours:  Vitals:   12/10/23 0545 12/10/23 0804 12/10/23 0937 12/10/23 1226  BP: (!) 136/94  130/86   Pulse: 67  68   Resp: 18 12  13   Temp: 98.1 F (36.7 C)  97.9 F (36.6 C)   TempSrc: Oral  Oral   SpO2: 100%  100%   Weight:      Height:        Intake/Output from previous day:   Intake/Output Summary (Last 24 hours) at 12/10/2023 1302 Last data filed at 12/10/2023 0908 Gross per 24 hour  Intake 4517.95 ml  Output 3225 ml  Net 1292.95 ml    Physical Exam: General: Alert, awake, oriented x3, in no acute distress.  HEENT: [place]/AT [place]ER[place], EOMI Neck: Trachea midline,  no masses, no thyromegal,y no JVD, no carotid bruit OROPHARYNX:  Moist, No exudate/ erythema/lesions.  Heart: Regular rate and rhythm, without murmurs, rubs, gallops, PMI non-displaced, no heaves or thrills on palpation.  Lungs: Clear to auscultation, no wheezing or rhonchi noted. No increased vocal fremitus resonant to percussion  Abdomen: Soft, nontender, nondistended, positive bowel sounds, no masses no hepatosplenomegaly noted..  Neuro: No focal neurological deficits noted cranial nerves II through XII grossly intact. DTRs 2+ bilaterally upper and lower extremities.  Strength 5 out of 5 in bilateral upper and lower extremities. Musculoskeletal: No warm swelling or erythema around joints, no spinal tenderness noted. Psychiatric: Patient alert and oriented x3, good insight and cognition, good recent to remote recall. Lymph node survey: No cervical axillary or inguinal lymphadenopathy noted.  Lab Results:  Basic Metabolic Panel:    Component Value Date/Time   NA 137 12/10/2023 0424   K 4.0 12/10/2023 0424   CL 103 12/10/2023 0424   CO2 25 12/10/2023 0424   BUN 10 12/10/2023 0424   CREATININE 0.91 12/10/2023 0424   GLUCOSE 104 (H) 12/10/2023 0424   CALCIUM 9.3 12/10/2023 0424   CBC:    Component Value Date/Time   WBC 8.4 12/10/2023 1137   HGB 8.6 (L) 12/10/2023 1137   HCT 23.9 (L) 12/10/2023 1137   PLT 410 (H) 12/10/2023 1137   MCV 100.0 12/10/2023 1137   NEUTROABS 4.5 12/09/2023 0452   LYMPHSABS 4.8 (H) 12/09/2023 0452   MONOABS 1.5 (H) 12/09/2023 0452   EOSABS 0.2 12/09/2023 0452   BASOSABS 0.1 12/09/2023 0452    No results found for this or any previous visit (from the past 240 hours).  Studies/Results: US  Abdomen Limited RUQ (LIVER/GB) Result Date: 12/09/2023 CLINICAL DATA:  History of prior cholecystectomy on November 22, 2023, presenting with right upper quadrant pain. EXAM: ULTRASOUND ABDOMEN LIMITED RIGHT UPPER QUADRANT COMPARISON:  November 20, 2023 FINDINGS: Gallbladder: The gallbladder is  surgically absent. Common bile duct: Diameter: 6.8 mm Liver: No focal lesion identified. Diffusely increased echogenicity of the liver parenchyma is noted. Portal vein is patent on color Doppler imaging with normal direction of blood flow towards the liver. Other: None. IMPRESSION: 1. Findings consistent with prior cholecystectomy. 2. Hepatic steatosis. Electronically Signed   By: Suzen Dials M.D.   On: 12/09/2023 00:52    Medications: Scheduled Meds:  folic acid   1 mg Oral Daily   HYDROmorphone    Intravenous Q4H   senna-docusate  1 tablet Oral  BID   Continuous Infusions:  dextrose  5 % and 0.45 % NaCl 75 mL/hr at 12/10/23 1239   PRN Meds:.diphenhydrAMINE , hydrOXYzine , naloxone  **AND** sodium chloride  flush, ondansetron  **OR** ondansetron  (ZOFRAN ) IV, oxyCODONE   Consultants: Gastroenterologist  Procedures: MRCP planned for tomorrow.  Antibiotics: None.  Assessment/Plan: Principal Problem:   Sickle cell pain crisis (HCC) Active Problems:   Sickle cell crisis (HCC)   Anemia of chronic disease   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: Nausea and vomiting has improved.  Patient is status post lap chole on 11/22/2023, and MRCP during his last admission.  Appreciate GI consult and input.  Patient will be n.p.o. postmidnight tonight for planned MRCP tomorrow morning per gastroenterologist.  Continue supportive care. Hb Sickle Cell Disease with Pain crisis: Restart IVF 0.45% Saline @75  mls/hour in anticipation for n.p.o., continue weight based Dilaudid  PCA at current dose setting, continue IV Toradol  15 mg Q 6 H for a total of 5 days, continue oral home pain medications as ordered. Monitor vitals very closely, Re-evaluate pain scale regularly, 2 L of Oxygen by [place]. Patient encouraged to ambulate on the hallway today.  Leukocytosis: Resolved most likely multifactorial.  Will continue to monitor closely. Anemia of Chronic Disease: Hemoglobin continues to be stable at baseline.  There is no clinical indication for blood transfusion at this time.  Will continue to monitor closely and transfuse as appropriate. Chronic pain Syndrome: Continue home medications.  Code Status: Full Code Family Communication: N/A Disposition Plan: Not yet ready for discharge  Manuel Mcguire  If 7PM-7AM, please contact night-coverage.  12/10/2023, 1:02 PM  LOS: 2 days

## 2023-12-11 ENCOUNTER — Inpatient Hospital Stay (HOSPITAL_COMMUNITY)

## 2023-12-11 DIAGNOSIS — R1084 Generalized abdominal pain: Secondary | ICD-10-CM | POA: Diagnosis not present

## 2023-12-11 DIAGNOSIS — Z9049 Acquired absence of other specified parts of digestive tract: Secondary | ICD-10-CM | POA: Diagnosis not present

## 2023-12-11 DIAGNOSIS — D57219 Sickle-cell/Hb-C disease with crisis, unspecified: Secondary | ICD-10-CM | POA: Diagnosis not present

## 2023-12-11 DIAGNOSIS — K838 Other specified diseases of biliary tract: Secondary | ICD-10-CM | POA: Diagnosis not present

## 2023-12-11 DIAGNOSIS — R1010 Upper abdominal pain, unspecified: Secondary | ICD-10-CM | POA: Diagnosis not present

## 2023-12-11 DIAGNOSIS — R748 Abnormal levels of other serum enzymes: Secondary | ICD-10-CM | POA: Diagnosis not present

## 2023-12-11 DIAGNOSIS — R1013 Epigastric pain: Secondary | ICD-10-CM | POA: Diagnosis not present

## 2023-12-11 DIAGNOSIS — D5700 Hb-SS disease with crisis, unspecified: Secondary | ICD-10-CM | POA: Diagnosis not present

## 2023-12-11 LAB — COMPREHENSIVE METABOLIC PANEL WITH GFR
ALT: 80 U/L — ABNORMAL HIGH (ref 0–44)
AST: 60 U/L — ABNORMAL HIGH (ref 15–41)
Albumin: 4.1 g/dL (ref 3.5–5.0)
Alkaline Phosphatase: 113 U/L (ref 38–126)
Anion gap: 9 (ref 5–15)
BUN: 9 mg/dL (ref 6–20)
CO2: 26 mmol/L (ref 22–32)
Calcium: 9.2 mg/dL (ref 8.9–10.3)
Chloride: 99 mmol/L (ref 98–111)
Creatinine, Ser: 0.65 mg/dL (ref 0.61–1.24)
GFR, Estimated: >60 mL/min (ref >60)
Glucose, Bld: 99 mg/dL (ref 70–99)
Potassium: 3.7 mmol/L (ref 3.5–5.1)
Sodium: 134 mmol/L — ABNORMAL LOW (ref 135–145)
Total Bilirubin: 2.7 mg/dL — ABNORMAL HIGH (ref 0.0–1.2)
Total Protein: 8.5 g/dL — ABNORMAL HIGH (ref 6.5–8.1)

## 2023-12-11 MED ORDER — HYDROMORPHONE HCL 1 MG/ML IJ SOLN
1.0000 mg | Freq: Once | INTRAMUSCULAR | Status: AC
  Administered 2023-12-11: 1 mg via INTRAVENOUS
  Filled 2023-12-11: qty 1

## 2023-12-11 MED ORDER — DEXTROSE-SODIUM CHLORIDE 5-0.45 % IV SOLN: INTRAVENOUS | Status: AC

## 2023-12-11 MED ORDER — POLYETHYLENE GLYCOL 3350 17 G PO PACK
17.0000 g | PACK | Freq: Once | ORAL | Status: AC
  Administered 2023-12-11: 17 g via ORAL
  Filled 2023-12-11: qty 1

## 2023-12-11 MED ORDER — POLYETHYLENE GLYCOL 3350 17 G PO PACK: 17.0000 g | PACK | Freq: Every day | ORAL | Status: DC

## 2023-12-11 MED ORDER — GADOBUTROL 1 MMOL/ML IV SOLN
7.0000 mL | Freq: Once | INTRAVENOUS | Status: AC | PRN
  Administered 2023-12-11: 7 mL via INTRAVENOUS

## 2023-12-11 NOTE — Progress Notes (Signed)
 [facility] Gastroenterology Progress Note  Manuel Mcguire 33 y.o. [birth year]   Subjective: Feels good. Abdominal pain improved.  Objective: Vital signs: Vitals:   12/11/23 0906 12/11/23 1212  BP: 137/78   Pulse: 60   Resp: 15 15  Temp: 98.2 F (36.8 C)   SpO2: 98%     Physical Exam: Gen: alert, no acute distress  HEENT: anicteric sclera CV: RRR Chest: CTA B Abd: soft, nontender, nondistended, +BS Ext: no edema  Lab Results: Recent Labs    12/08/23 2024 12/08/23 2100 12/09/23 0452 12/10/23 0424 12/11/23 0437  NA  --    < > 138 137 134*  K  --    < > 3.7 4.0 3.7  CL  --    < > 105 103 99  CO2  --    < > 21* 25 26  GLUCOSE  --    < > 106* 104* 99  BUN  --    < > 11 10 9   CREATININE  --    < > 0.86 0.91 0.65  CALCIUM  --    < > 9.2 9.3 9.2  MG 2.5*  --  2.1  --   --   PHOS 4.8*  --  4.9*  --   --    < > = values in this interval not displayed.   Recent Labs    12/10/23 0424 12/11/23 0437  AST 72* 60*  ALT 97* 80*  ALKPHOS 112 113  BILITOT 2.3* 2.7*  PROT 8.1 8.5*  ALBUMIN 3.7 4.1   Recent Labs    12/08/23 2100 12/09/23 0452 12/10/23 1137  WBC 9.9 11.1* 8.4  NEUTROABS 5.7 4.5  --   HGB 10.3* 8.4* 8.6*  HCT 28.6* 24.0* 23.9*  MCV 100.4* 101.7* 100.0  PLT 574* 446* 410*      Assessment/Plan: Epigastric pain with N/V and elevated LFTs - LFTs continue to improve. Updated MRCP today to compare to June's study. Advance diet following MRCP pending findings. Supportive care. Dr. Rosalie will f/u tomorrow.   Manuel Mcguire 12/11/2023, 12:53 PM  Questions please call [phone] ID: Manuel Mcguire, male   DOB: December 07, 1990, 33 y.o.   MRN: [id number]

## 2023-12-11 NOTE — Plan of Care (Signed)

## 2023-12-11 NOTE — Progress Notes (Signed)
 Patient ID: SHNEUR WHITTENBURG, male   DOB: 11-Jan-1991, 33 y.o.   MRN: [id number] Subjective: Frederich Montilla is a 33 year old male with a medical history significant for sickle cell disease was admitted for abdominal pain, nausea, vomiting in the setting of sickle cell pain crisis.    Patient is much better today although still has some abdominal pain and he is concerned that he has not been able to eat because of being n.p.o. pending MRCP this afternoon.  He has no new complaint.  Nausea and vomiting has improved.  No fever.  No urinary symptoms.  Objective:  Vital signs in last 24 hours:  Vitals:   12/11/23 0906 12/11/23 1212 12/11/23 1410 12/11/23 1523  BP: 137/78  (!) 133/92   Pulse: 60  67   Resp: 15 15 15 11   Temp: 98.2 F (36.8 C)  98.7 F (37.1 C)   TempSrc: Oral  Oral   SpO2: 98%  100%   Weight:      Height:        Intake/Output from previous day:   Intake/Output Summary (Last 24 hours) at 12/11/2023 1728 Last data filed at 12/11/2023 1400 Gross per 24 hour  Intake 1830.03 ml  Output 3350 ml  Net -1519.97 ml    Physical Exam: General: Alert, awake, oriented x3, in no acute distress.  HEENT: [place]/AT [place]ER[place], EOMI Neck: Trachea midline,  no masses, no thyromegal,y no JVD, no carotid bruit OROPHARYNX:  Moist, No exudate/ erythema/lesions.  Heart: Regular rate and rhythm, without murmurs, rubs, gallops, PMI non-displaced, no heaves or thrills on palpation.  Lungs: Clear to auscultation, no wheezing or rhonchi noted. No increased vocal fremitus resonant to percussion  Abdomen: Soft, nontender, nondistended, positive bowel sounds, no masses no hepatosplenomegaly noted..  Neuro: No focal neurological deficits noted cranial nerves II through XII grossly intact. DTRs 2+ bilaterally upper and lower extremities. Strength 5 out of 5 in bilateral upper and lower extremities. Musculoskeletal: No warm swelling or erythema around joints, no spinal tenderness noted. Psychiatric: Patient  alert and oriented x3, good insight and cognition, good recent to remote recall. Lymph node survey: No cervical axillary or inguinal lymphadenopathy noted.  Lab Results:  Basic Metabolic Panel:    Component Value Date/Time   NA 134 (L) 12/11/2023 0437   K 3.7 12/11/2023 0437   CL 99 12/11/2023 0437   CO2 26 12/11/2023 0437   BUN 9 12/11/2023 0437   CREATININE 0.65 12/11/2023 0437   GLUCOSE 99 12/11/2023 0437   CALCIUM 9.2 12/11/2023 0437   CBC:    Component Value Date/Time   WBC 8.4 12/10/2023 1137   HGB 8.6 (L) 12/10/2023 1137   HCT 23.9 (L) 12/10/2023 1137   PLT 410 (H) 12/10/2023 1137   MCV 100.0 12/10/2023 1137   NEUTROABS 4.5 12/09/2023 0452   LYMPHSABS 4.8 (H) 12/09/2023 0452   MONOABS 1.5 (H) 12/09/2023 0452   EOSABS 0.2 12/09/2023 0452   BASOSABS 0.1 12/09/2023 0452    No results found for this or any previous visit (from the past 240 hours).  Studies/Results: MR ABDOMEN MRCP W WO CONTAST Result Date: 12/11/2023 CLINICAL DATA:  Abdominal pain, epigastric pain, recent cholecystectomy EXAM: MRI ABDOMEN WITHOUT AND WITH CONTRAST (INCLUDING MRCP) TECHNIQUE: Multiplanar multisequence MR imaging of the abdomen was performed both before and after the administration of intravenous contrast. Heavily T2-weighted images of the biliary and pancreatic ducts were obtained, and three-dimensional MRCP images were rendered by post processing. CONTRAST:  7mL GADAVIST  GADOBUTROL  1  MMOL/ML IV SOLN COMPARISON:  11/27/2023 FINDINGS: Metallic susceptibility artifact from thoracolumbar fusion significantly degrades multiple sequences submitted for review, particularly fat saturated T2 and multiphasic contrast enhanced sequences. Lower chest: No acute abnormality.  Cardiomegaly. Hepatobiliary: No focal liver abnormality is seen. Status post cholecystectomy. Mild dilatation of the common bile duct measuring up to 0.8 cm, tapering to the ampulla without calculus or other obstruction visible (series  3, image 13). Pancreas: Unremarkable. No pancreatic ductal dilatation or surrounding inflammatory changes. Spleen: Normal in size without significant abnormality. Adrenals/Urinary Tract: Adrenal glands are unremarkable. Lesion of the peripheral superior pole of the right kidney is more clearly characterized as a benign, nonenhancing hemorrhagic or proteinaceous cyst on today's examination. Kidneys are otherwise normal, without obvious renal calculi, solid lesion, or hydronephrosis. Stomach/Bowel: Stomach is within normal limits. No evidence of bowel wall thickening, distention, or inflammatory changes. Vascular/Lymphatic: No significant vascular findings are present. No enlarged abdominal lymph nodes. Other: No abdominal wall hernia or abnormality. No ascites. Musculoskeletal: No acute or significant osseous findings. Metallic susceptibility artifact secondary to thoracolumbar fusion. IMPRESSION: 1. Metallic susceptibility artifact from thoracolumbar fusion significantly degrades multiple sequences submitted for review, particularly fat saturated T2 and multiphasic contrast enhanced sequences. 2. Status post cholecystectomy. 3. Mild dilatation of the common bile duct measuring up to 0.8 cm, tapering to the ampulla without calculus or other obstruction visible. 4. Cardiomegaly. Electronically Signed   By: Marolyn JONETTA Jaksch M.D.   On: 12/11/2023 15:37   MR 3D Recon At Scanner Result Date: 12/11/2023 CLINICAL DATA:  Abdominal pain, epigastric pain, recent cholecystectomy EXAM: MRI ABDOMEN WITHOUT AND WITH CONTRAST (INCLUDING MRCP) TECHNIQUE: Multiplanar multisequence MR imaging of the abdomen was performed both before and after the administration of intravenous contrast. Heavily T2-weighted images of the biliary and pancreatic ducts were obtained, and three-dimensional MRCP images were rendered by post processing. CONTRAST:  7mL GADAVIST  GADOBUTROL  1 MMOL/ML IV SOLN COMPARISON:  11/27/2023 FINDINGS: Metallic  susceptibility artifact from thoracolumbar fusion significantly degrades multiple sequences submitted for review, particularly fat saturated T2 and multiphasic contrast enhanced sequences. Lower chest: No acute abnormality.  Cardiomegaly. Hepatobiliary: No focal liver abnormality is seen. Status post cholecystectomy. Mild dilatation of the common bile duct measuring up to 0.8 cm, tapering to the ampulla without calculus or other obstruction visible (series 3, image 13). Pancreas: Unremarkable. No pancreatic ductal dilatation or surrounding inflammatory changes. Spleen: Normal in size without significant abnormality. Adrenals/Urinary Tract: Adrenal glands are unremarkable. Lesion of the peripheral superior pole of the right kidney is more clearly characterized as a benign, nonenhancing hemorrhagic or proteinaceous cyst on today's examination. Kidneys are otherwise normal, without obvious renal calculi, solid lesion, or hydronephrosis. Stomach/Bowel: Stomach is within normal limits. No evidence of bowel wall thickening, distention, or inflammatory changes. Vascular/Lymphatic: No significant vascular findings are present. No enlarged abdominal lymph nodes. Other: No abdominal wall hernia or abnormality. No ascites. Musculoskeletal: No acute or significant osseous findings. Metallic susceptibility artifact secondary to thoracolumbar fusion. IMPRESSION: 1. Metallic susceptibility artifact from thoracolumbar fusion significantly degrades multiple sequences submitted for review, particularly fat saturated T2 and multiphasic contrast enhanced sequences. 2. Status post cholecystectomy. 3. Mild dilatation of the common bile duct measuring up to 0.8 cm, tapering to the ampulla without calculus or other obstruction visible. 4. Cardiomegaly. Electronically Signed   By: Marolyn JONETTA Jaksch M.D.   On: 12/11/2023 15:37    Medications: Scheduled Meds:  folic acid   1 mg Oral Daily   HYDROmorphone    Intravenous Q4H   senna-docusate  1 tablet Oral BID   Continuous Infusions:  dextrose  5 % and 0.45 % NaCl 50 mL/hr at 12/11/23 1546   PRN Meds:.diphenhydrAMINE , hydrOXYzine , naloxone  **AND** sodium chloride  flush, ondansetron  **OR** ondansetron  (ZOFRAN ) IV, oxyCODONE   Consultants: Gastroenterologist  Procedures: MRCP planned for tomorrow.  Antibiotics: None.  Assessment/Plan: Principal Problem:   Sickle cell pain crisis (HCC) Active Problems:   Sickle cell crisis (HCC)   Anemia of chronic disease   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: Nausea and vomiting has improved.  Patient is status post lap chole on 11/22/2023, and MRCP during his last admission.  Scheduled for updated MRCP this afternoon.  Appreciate GI consult and input. Continue supportive care.  Further management will depend on MRCP findings and recommendation from GI specialist.   Hb Sickle Cell Disease with Pain crisis: Will discontinue IVF after the procedure and resume p.o. intake.  Continue weight based Dilaudid  PCA at current dose setting, continue IV Toradol  15 mg Q 6 H for a total of 5 days, continue oral home pain medications as ordered. Monitor vitals very closely, Re-evaluate pain scale regularly, 2 L of Oxygen by [place]. Patient encouraged to ambulate on the hallway today.  Leukocytosis: Resolved most likely multifactorial.  Will continue to monitor closely. Anemia of Chronic Disease: Hemoglobin continues to be stable at baseline.  There is no clinical indication for blood transfusion at this time.  Will continue to monitor closely and transfuse as appropriate.  Repeat labs in AM. Chronic pain Syndrome: Continue home medications.  Code Status: Full Code Family Communication: N/A Disposition Plan: Not yet ready for discharge  Coye Dawood  If 7PM-7AM, please contact night-coverage.  12/11/2023, 5:28 PM  LOS: 3 days

## 2023-12-12 DIAGNOSIS — R932 Abnormal findings on diagnostic imaging of liver and biliary tract: Secondary | ICD-10-CM | POA: Diagnosis not present

## 2023-12-12 DIAGNOSIS — R1030 Lower abdominal pain, unspecified: Secondary | ICD-10-CM | POA: Diagnosis not present

## 2023-12-12 DIAGNOSIS — R7401 Elevation of levels of liver transaminase levels: Secondary | ICD-10-CM | POA: Diagnosis not present

## 2023-12-12 LAB — CBC WITH DIFFERENTIAL/PLATELET
Abs Immature Granulocytes: 0.02 K/uL (ref 0.00–0.07)
Basophils Absolute: 0.1 K/uL (ref 0.0–0.1)
Basophils Relative: 1 %
Eosinophils Absolute: 0.3 K/uL (ref 0.0–0.5)
Eosinophils Relative: 3 %
HCT: 22.2 % — ABNORMAL LOW (ref 39.0–52.0)
Hemoglobin: 8.2 g/dL — ABNORMAL LOW (ref 13.0–17.0)
Immature Granulocytes: 0 %
Lymphocytes Relative: 50 %
Lymphs Abs: 3.9 K/uL (ref 0.7–4.0)
MCH: 36.9 pg — ABNORMAL HIGH (ref 26.0–34.0)
MCHC: 36.9 g/dL — ABNORMAL HIGH (ref 30.0–36.0)
MCV: 100 fL (ref 80.0–100.0)
Monocytes Absolute: 1.2 K/uL — ABNORMAL HIGH (ref 0.1–1.0)
Monocytes Relative: 15 %
Neutro Abs: 2.5 K/uL (ref 1.7–7.7)
Neutrophils Relative %: 31 %
Platelets: 348 K/uL (ref 150–400)
RBC: 2.22 MIL/uL — ABNORMAL LOW (ref 4.22–5.81)
RDW: 16.1 % — ABNORMAL HIGH (ref 11.5–15.5)
WBC: 8 K/uL (ref 4.0–10.5)
nRBC: 0.9 % — ABNORMAL HIGH (ref 0.0–0.2)

## 2023-12-12 LAB — COMPREHENSIVE METABOLIC PANEL WITH GFR
ALT: 56 U/L — ABNORMAL HIGH (ref 0–44)
AST: 45 U/L — ABNORMAL HIGH (ref 15–41)
Albumin: 3.7 g/dL (ref 3.5–5.0)
Alkaline Phosphatase: 109 U/L (ref 38–126)
Anion gap: 9 (ref 5–15)
BUN: 15 mg/dL (ref 6–20)
CO2: 26 mmol/L (ref 22–32)
Calcium: 9 mg/dL (ref 8.9–10.3)
Chloride: 101 mmol/L (ref 98–111)
Creatinine, Ser: 0.73 mg/dL (ref 0.61–1.24)
GFR, Estimated: >60 mL/min (ref >60)
Glucose, Bld: 113 mg/dL — ABNORMAL HIGH (ref 70–99)
Potassium: 3.8 mmol/L (ref 3.5–5.1)
Sodium: 136 mmol/L (ref 135–145)
Total Bilirubin: 2.1 mg/dL — ABNORMAL HIGH (ref 0.0–1.2)
Total Protein: 7.9 g/dL (ref 6.5–8.1)

## 2023-12-12 MED ORDER — DEXTROSE-SODIUM CHLORIDE 5-0.45 % IV SOLN: INTRAVENOUS | Status: DC

## 2023-12-12 NOTE — Progress Notes (Signed)
 Patient ID: JOSEANGEL NETTLETON, male   DOB: 31-Aug-1990, 33 y.o.   MRN: [id number] Subjective: Wyeth Hoffer is a 33 year old male with a medical history significant for sickle cell disease was admitted for abdominal pain, nausea, vomiting with sickle cell pain crisis.     Patient reports significant improvement to pain and nausea.  He is able to tolerate p.o. without vomiting.  Denies fever, headache, SOB, cough. No urinary symptoms.  Objective:  Vital signs in last 24 hours:  Vitals:   12/12/23 0400 12/12/23 0508 12/12/23 0803 12/12/23 0913  BP:  118/72  135/84  Pulse:  76  71  Resp: 15 14 15 15   Temp:  98.1 F (36.7 C)  98.3 F (36.8 C)  TempSrc:  Oral  Oral  SpO2:  98%  98%  Weight:      Height:        Intake/Output from previous day:   Intake/Output Summary (Last 24 hours) at 12/12/2023 1100 Last data filed at 12/12/2023 9077 Gross per 24 hour  Intake 1131.48 ml  Output 3275 ml  Net -2143.52 ml    Physical Exam: General: Alert, awake, oriented x3, in no acute distress.  HEENT: [place]/AT [place]ER[place], EOMI Neck: Trachea midline,  no masses, no thyromegal,y no JVD, no carotid bruit OROPHARYNX:  Moist, No exudate/ erythema/lesions.  Heart: Regular rate and rhythm, without murmurs, rubs, gallops, PMI non-displaced, no heaves or thrills on palpation.  Lungs: Clear to auscultation, no wheezing or rhonchi noted. No increased vocal fremitus resonant to percussion  Abdomen: Nausea  Neuro: No focal neurological deficits noted cranial nerves II through XII grossly intact. DTRs 2+ bilaterally upper and lower extremities. Strength 5 out of 5 in bilateral upper and lower extremities. Musculoskeletal: Generalized body tenderness  psychiatric: Patient alert and oriented x3, good insight and cognition, good recent to remote recall. Lymph node survey: No cervical axillary or inguinal lymphadenopathy noted.  Lab Results:  Basic Metabolic Panel:    Component Value Date/Time   NA 136 12/12/2023  0500   K 3.8 12/12/2023 0500   CL 101 12/12/2023 0500   CO2 26 12/12/2023 0500   BUN 15 12/12/2023 0500   CREATININE 0.73 12/12/2023 0500   GLUCOSE 113 (H) 12/12/2023 0500   CALCIUM 9.0 12/12/2023 0500   CBC:    Component Value Date/Time   WBC 8.0 12/12/2023 0500   HGB 8.2 (L) 12/12/2023 0500   HCT 22.2 (L) 12/12/2023 0500   PLT 348 12/12/2023 0500   MCV 100.0 12/12/2023 0500   NEUTROABS 2.5 12/12/2023 0500   LYMPHSABS 3.9 12/12/2023 0500   MONOABS 1.2 (H) 12/12/2023 0500   EOSABS 0.3 12/12/2023 0500   BASOSABS 0.1 12/12/2023 0500    No results found for this or any previous visit (from the past 240 hours).  Studies/Results: MR ABDOMEN MRCP W WO CONTAST Result Date: 12/11/2023 CLINICAL DATA:  Abdominal pain, epigastric pain, recent cholecystectomy EXAM: MRI ABDOMEN WITHOUT AND WITH CONTRAST (INCLUDING MRCP) TECHNIQUE: Multiplanar multisequence MR imaging of the abdomen was performed both before and after the administration of intravenous contrast. Heavily T2-weighted images of the biliary and pancreatic ducts were obtained, and three-dimensional MRCP images were rendered by post processing. CONTRAST:  7mL GADAVIST  GADOBUTROL  1 MMOL/ML IV SOLN COMPARISON:  11/27/2023 FINDINGS: Metallic susceptibility artifact from thoracolumbar fusion significantly degrades multiple sequences submitted for review, particularly fat saturated T2 and multiphasic contrast enhanced sequences. Lower chest: No acute abnormality.  Cardiomegaly. Hepatobiliary: No focal liver abnormality is seen. Status post cholecystectomy.  Mild dilatation of the common bile duct measuring up to 0.8 cm, tapering to the ampulla without calculus or other obstruction visible (series 3, image 13). Pancreas: Unremarkable. No pancreatic ductal dilatation or surrounding inflammatory changes. Spleen: Normal in size without significant abnormality. Adrenals/Urinary Tract: Adrenal glands are unremarkable. Lesion of the peripheral superior  pole of the right kidney is more clearly characterized as a benign, nonenhancing hemorrhagic or proteinaceous cyst on today's examination. Kidneys are otherwise normal, without obvious renal calculi, solid lesion, or hydronephrosis. Stomach/Bowel: Stomach is within normal limits. No evidence of bowel wall thickening, distention, or inflammatory changes. Vascular/Lymphatic: No significant vascular findings are present. No enlarged abdominal lymph nodes. Other: No abdominal wall hernia or abnormality. No ascites. Musculoskeletal: No acute or significant osseous findings. Metallic susceptibility artifact secondary to thoracolumbar fusion. IMPRESSION: 1. Metallic susceptibility artifact from thoracolumbar fusion significantly degrades multiple sequences submitted for review, particularly fat saturated T2 and multiphasic contrast enhanced sequences. 2. Status post cholecystectomy. 3. Mild dilatation of the common bile duct measuring up to 0.8 cm, tapering to the ampulla without calculus or other obstruction visible. 4. Cardiomegaly. Electronically Signed   By: Marolyn JONETTA Jaksch M.D.   On: 12/11/2023 15:37   MR 3D Recon At Scanner Result Date: 12/11/2023 CLINICAL DATA:  Abdominal pain, epigastric pain, recent cholecystectomy EXAM: MRI ABDOMEN WITHOUT AND WITH CONTRAST (INCLUDING MRCP) TECHNIQUE: Multiplanar multisequence MR imaging of the abdomen was performed both before and after the administration of intravenous contrast. Heavily T2-weighted images of the biliary and pancreatic ducts were obtained, and three-dimensional MRCP images were rendered by post processing. CONTRAST:  7mL GADAVIST  GADOBUTROL  1 MMOL/ML IV SOLN COMPARISON:  11/27/2023 FINDINGS: Metallic susceptibility artifact from thoracolumbar fusion significantly degrades multiple sequences submitted for review, particularly fat saturated T2 and multiphasic contrast enhanced sequences. Lower chest: No acute abnormality.  Cardiomegaly. Hepatobiliary: No focal  liver abnormality is seen. Status post cholecystectomy. Mild dilatation of the common bile duct measuring up to 0.8 cm, tapering to the ampulla without calculus or other obstruction visible (series 3, image 13). Pancreas: Unremarkable. No pancreatic ductal dilatation or surrounding inflammatory changes. Spleen: Normal in size without significant abnormality. Adrenals/Urinary Tract: Adrenal glands are unremarkable. Lesion of the peripheral superior pole of the right kidney is more clearly characterized as a benign, nonenhancing hemorrhagic or proteinaceous cyst on today's examination. Kidneys are otherwise normal, without obvious renal calculi, solid lesion, or hydronephrosis. Stomach/Bowel: Stomach is within normal limits. No evidence of bowel wall thickening, distention, or inflammatory changes. Vascular/Lymphatic: No significant vascular findings are present. No enlarged abdominal lymph nodes. Other: No abdominal wall hernia or abnormality. No ascites. Musculoskeletal: No acute or significant osseous findings. Metallic susceptibility artifact secondary to thoracolumbar fusion. IMPRESSION: 1. Metallic susceptibility artifact from thoracolumbar fusion significantly degrades multiple sequences submitted for review, particularly fat saturated T2 and multiphasic contrast enhanced sequences. 2. Status post cholecystectomy. 3. Mild dilatation of the common bile duct measuring up to 0.8 cm, tapering to the ampulla without calculus or other obstruction visible. 4. Cardiomegaly. Electronically Signed   By: Marolyn JONETTA Jaksch M.D.   On: 12/11/2023 15:37    Medications: Scheduled Meds:  folic acid   1 mg Oral Daily   HYDROmorphone    Intravenous Q4H   senna-docusate  1 tablet Oral BID   Continuous Infusions:  dextrose  5 % and 0.45 % NaCl 50 mL/hr at 12/12/23 0600   PRN Meds:.diphenhydrAMINE , hydrOXYzine , naloxone  **AND** sodium chloride  flush, ondansetron  **OR** ondansetron  (ZOFRAN ) IV,  oxyCODONE   Consultants: Gastroenterologist consults completed  Procedures: MRCP  completed  Antibiotics: None  Assessment/Plan: Principal Problem:   Sickle cell pain crisis (HCC) Active Problems:   Sickle cell crisis (HCC)   Anemia of chronic disease   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:Nausea and vomiting has improved.  Patient is status post lap chole on 11/22/2023,  MRCP completed and shows Mild dilatation of the common bile duct measuring up to 0.8 cm,tapering to the ampulla without calculus or other obstruction visible.further management will depend on GI conclusion on MRCP findings. Nausea and Vomiting: Improving, patient is tolerating p.o. without vomiting.  Continue Zofran  as prescribed. Hb Sickle Cell Disease with Pain crisis: Continue IVF 0.45% Saline @50  mls/hour, continue weight based Dilaudid  PCA, IV Toradol  15 mg Q 6 H for a total of 5 days, [completed].  Continue oral home pain medications as ordered. Monitor vitals very closely, Re-evaluate pain scale regularly, 2 L of Oxygen by [place]. Patient encouraged to ambulate on the hallway today.   Leukocytosis: slightly elevated however no acute s/s of infection we will continue to monitor daily CBC. Anemia of Chronic Disease: HGB stable within patients baseline, will continue to monitor daily cbc Chronic pain Syndrome: Continue oral home pain medication   Code Status: Full Code Family Communication: N/A Disposition Plan: Patient will be ready for discharge in the a.m.  [name] Cover NP  If 7PM-7AM, please contact night-coverage.  12/12/2023, 11:00 AM  LOS: 4 days

## 2023-12-12 NOTE — Plan of Care (Signed)

## 2023-12-12 NOTE — Progress Notes (Signed)
 Manuel Mcguire 12:05 PM  Subjective: Patient seen and hospital computer chart reviewed and case discussed with my partner Dr. Dianna and he says his pain is better but wants to go back to sleep and plans to eat his breakfast later and has no new complaints  Objective: Vital signs stable afebrile no acute distress patient not examined today in okay spirits just wants to go back to bed labs and MRCP were reviewed which was okay  Assessment: Doubt CBD stones  Plan: I recommend him following up in our office with Dr. Burnette to set up a outpatient endoscopic ultrasound i.e. EUS just to be sure no residual CBD stones present and please call me if I can be of any further assistance with this hospital stay  [facility]  office [phone] After 5PM or if no answer call [phone]

## 2023-12-12 NOTE — Progress Notes (Addendum)
 NP Blondie was paged because patient is asking for Miralax , per patient LBM was prior to admission. He was given his scheduled Senna. NP Blondie suggested that patient tell CS team about not having a BM because it could be associated to opiate use and frequent laxative use could be dangerous. Patient was informed.

## 2023-12-12 NOTE — Plan of Care (Signed)

## 2023-12-13 LAB — CBC
HCT: 21.6 % — ABNORMAL LOW (ref 39.0–52.0)
Hemoglobin: 7.9 g/dL — ABNORMAL LOW (ref 13.0–17.0)
MCH: 36.2 pg — ABNORMAL HIGH (ref 26.0–34.0)
MCHC: 36.6 g/dL — ABNORMAL HIGH (ref 30.0–36.0)
MCV: 99.1 fL (ref 80.0–100.0)
Platelets: 308 K/uL (ref 150–400)
RBC: 2.18 MIL/uL — ABNORMAL LOW (ref 4.22–5.81)
RDW: 16.2 % — ABNORMAL HIGH (ref 11.5–15.5)
WBC: 9.6 K/uL (ref 4.0–10.5)
nRBC: 0.2 % (ref 0.0–0.2)

## 2023-12-13 NOTE — Plan of Care (Signed)

## 2023-12-13 NOTE — Progress Notes (Signed)
 Discharge instructions were reviewed with the patient. His father is at bedside. He denied questions or concerns at this time.

## 2023-12-13 NOTE — Discharge Summary (Signed)
 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 Manuel Mcguire is instructed to increase activity slowly and balance with rest for the next few days, and use prescribed medication to complete treatment of pain  Diet: Regular Wt Readings from Last 3 Encounters:  12/08/23 71 kg  11/26/23 71 kg  11/20/23 69.8 kg    History of present illness:  Manuel Mcguire is a 33 year old male with a medical history significant for sickle cell disease was admitted for abdominal pain, nausea, vomiting in the setting of sickle cell pain crisis.  Patient presented to the emergency department with nausea and vomiting.  He was recently admitted last month with cholecystitis is status post lap chole followed by an MRCP showing 2 small choledocholithiasis with normal LFTs.  Patient was discharged on 6/28 and presented 7/3 with profuse nausea and vomiting.  Patient denies cough, headache, fever, no sick contacts.  No urinary symptoms.  ED Course:  Patient treated in the emergency department with IV fluids, IV pain medication.  With no improvement to symptoms.  Ultrasound showing findings consistent with prior cholecystectomy and hepatic steatosis.  Total bili 2.7, ALP 141, AST 105, ALT 137.  Patient was admitted for ongoing symptom management and pain control. Pulse 91, respiration 16, temperature 98.4 F, SpO2 99%,  Hospital Course:  Patient was admitted for sickle cell pain crisis and managed  appropriately with IVF, IV Dilaudid  via PCA and IV Toradol , as well as other adjunct therapies per sickle cell pain management protocols.  GI consult completed, MRCP completed patient is status post Coley cystectomy.  MRCP shows mild dilation of the common bile duct measuring up to 0.8 cm, tapering to the ampulla without calculus or other obstruction visible.  Patient is reporting improved symptoms and pain at baseline.  He reports nausea is resolved and and tolerating p.o. without vomiting.  His only concern is when he goes home he may have a reoccurrence of symptoms. Patient is asking to be discharged today. Patient was therefore discharged home today in a hemodynamically stable condition.   Manuel Mcguire will follow-up with PCP within 1 week of this discharge. Manuel Mcguire was counseled extensively about nonpharmacologic means of pain management, education provided to patient regarding diet modification to help with GI symptoms.  Patient verbalized understanding and was appreciative of  the care received during this admission.   We discussed the need for good hydration, monitoring of hydration status, avoidance of heat, cold, stress, and infection triggers. We discussed the need to be adherent with taking other home medications. Patient was reminded of the need to seek medical attention immediately if any symptom of bleeding, anemia, or infection occurs.  Discharge Exam: Vitals:   12/13/23 0819 12/13/23 0828  BP:  (!) 122/99  Pulse:  (!) 109  Resp: (!) 24 17  Temp:  97.9 F (36.6 C)  SpO2:  97%   Vitals:   12/13/23 0451 12/13/23 0618 12/13/23 0819 12/13/23 0828  BP:  (!) 134/92  (!) 122/99  Pulse:  99  (!) 109  Resp: 15 18 (!) 24 17  Temp:  99 F (37.2 C)  97.9 F (36.6 C)  TempSrc:  Oral  Oral  SpO2: 95% 97%  97%  Weight:      Height:        General appearance : Awake, alert, not in any distress. Speech Clear. Not toxic looking HEENT: Atraumatic and Normocephalic, pupils equally reactive to  light and accomodation Neck: Supple, no JVD. No cervical lymphadenopathy.  Chest: Good air entry bilaterally, no added sounds  CVS: S1 S2 regular, no murmurs.  Abdomen: Bowel sounds present, Non tender and not distended with no gaurding, rigidity or rebound. Extremities: B/L Lower Ext shows no edema, both legs are warm to touch Neurology: Awake alert, and oriented X 3, CN II-XII intact, Non focal Skin: No Rash  Discharge Instructions     The results of significant diagnostics from this hospitalization (including imaging, microbiology, ancillary and laboratory) are listed below for reference.    Significant Diagnostic Studies: MR ABDOMEN MRCP W WO CONTAST Result Date: 12/11/2023 CLINICAL DATA:  Abdominal pain, epigastric pain, recent cholecystectomy EXAM: MRI ABDOMEN WITHOUT AND WITH CONTRAST (INCLUDING MRCP) TECHNIQUE: Multiplanar multisequence MR imaging of the abdomen was performed both before and after the administration of intravenous contrast. Heavily T2-weighted images of the biliary and pancreatic ducts were obtained, and three-dimensional MRCP images were rendered by post processing. CONTRAST:  7mL GADAVIST  GADOBUTROL  1 MMOL/ML IV SOLN COMPARISON:  11/27/2023 FINDINGS: Metallic susceptibility artifact from thoracolumbar fusion significantly degrades multiple sequences submitted for review, particularly fat saturated T2 and multiphasic contrast enhanced sequences. Lower chest: No acute abnormality.  Cardiomegaly. Hepatobiliary: No focal liver abnormality is seen. Status post cholecystectomy. Mild dilatation of the common bile duct measuring up to 0.8 cm, tapering to the ampulla without calculus or other obstruction visible (series 3, image 13). Pancreas: Unremarkable. No pancreatic ductal dilatation or surrounding inflammatory changes. Spleen: Normal in size without significant abnormality. Adrenals/Urinary Tract: Adrenal glands are unremarkable. Lesion of the peripheral superior pole of the  right kidney is more clearly characterized as a benign, nonenhancing hemorrhagic or proteinaceous cyst on today's examination. Kidneys are otherwise normal, without obvious renal calculi, solid lesion, or hydronephrosis. Stomach/Bowel: Stomach is within normal limits. No evidence of bowel wall thickening, distention, or inflammatory changes. Vascular/Lymphatic: No significant vascular findings are present. No enlarged abdominal lymph nodes. Other: No abdominal wall hernia or abnormality. No ascites. Musculoskeletal: No acute or significant osseous findings. Metallic susceptibility artifact secondary to thoracolumbar fusion. IMPRESSION: 1. Metallic susceptibility artifact from thoracolumbar fusion significantly degrades multiple sequences submitted for review, particularly fat saturated T2 and multiphasic contrast enhanced sequences. 2. Status post cholecystectomy. 3. Mild dilatation of the common bile duct measuring up to 0.8 cm, tapering to the ampulla without calculus or other obstruction visible. 4. Cardiomegaly. Electronically Signed   By: Marolyn JONETTA Jaksch M.D.   On: 12/11/2023 15:37   MR 3D Recon At Scanner Result Date: 12/11/2023 CLINICAL DATA:  Abdominal pain, epigastric pain, recent cholecystectomy EXAM: MRI ABDOMEN WITHOUT AND WITH CONTRAST (INCLUDING MRCP) TECHNIQUE: Multiplanar multisequence MR imaging of the abdomen was performed both before and after the administration of intravenous contrast. Heavily T2-weighted images of the biliary and pancreatic ducts were obtained, and three-dimensional MRCP images were rendered by post processing. CONTRAST:  7mL GADAVIST  GADOBUTROL  1 MMOL/ML IV SOLN COMPARISON:  11/27/2023 FINDINGS: Metallic susceptibility artifact from thoracolumbar fusion significantly degrades multiple sequences submitted for review, particularly fat saturated T2 and multiphasic contrast enhanced sequences.  Lower chest: No acute abnormality.  Cardiomegaly. Hepatobiliary: No focal liver  abnormality is seen. Status post cholecystectomy. Mild dilatation of the common bile duct measuring up to 0.8 cm, tapering to the ampulla without calculus or other obstruction visible (series 3, image 13). Pancreas: Unremarkable. No pancreatic ductal dilatation or surrounding inflammatory changes. Spleen: Normal in size without significant abnormality. Adrenals/Urinary Tract: Adrenal glands are unremarkable. Lesion of the peripheral superior pole of the right kidney is more clearly characterized as a benign, nonenhancing hemorrhagic or proteinaceous cyst on today's examination. Kidneys are otherwise normal, without obvious renal calculi, solid lesion, or hydronephrosis. Stomach/Bowel: Stomach is within normal limits. No evidence of bowel wall thickening, distention, or inflammatory changes. Vascular/Lymphatic: No significant vascular findings are present. No enlarged abdominal lymph nodes. Other: No abdominal wall hernia or abnormality. No ascites. Musculoskeletal: No acute or significant osseous findings. Metallic susceptibility artifact secondary to thoracolumbar fusion. IMPRESSION: 1. Metallic susceptibility artifact from thoracolumbar fusion significantly degrades multiple sequences submitted for review, particularly fat saturated T2 and multiphasic contrast enhanced sequences. 2. Status post cholecystectomy. 3. Mild dilatation of the common bile duct measuring up to 0.8 cm, tapering to the ampulla without calculus or other obstruction visible. 4. Cardiomegaly. Electronically Signed   By: Marolyn JONETTA Jaksch M.D.   On: 12/11/2023 15:37   US  Abdomen Limited RUQ (LIVER/GB) Result Date: 12/09/2023 CLINICAL DATA:  History of prior cholecystectomy on November 22, 2023, presenting with right upper quadrant pain. EXAM: ULTRASOUND ABDOMEN LIMITED RIGHT UPPER QUADRANT COMPARISON:  November 20, 2023 FINDINGS: Gallbladder: The gallbladder is surgically absent. Common bile duct: Diameter: 6.8 mm Liver: No focal lesion identified.  Diffusely increased echogenicity of the liver parenchyma is noted. Portal vein is patent on color Doppler imaging with normal direction of blood flow towards the liver. Other: None. IMPRESSION: 1. Findings consistent with prior cholecystectomy. 2. Hepatic steatosis. Electronically Signed   By: Suzen Dials M.D.   On: 12/09/2023 00:52   MR ABDOMEN MRCP W WO CONTAST Result Date: 11/27/2023 CLINICAL DATA:  Jaundice.  Status post cholecystectomy EXAM: MRI ABDOMEN WITHOUT AND WITH CONTRAST (INCLUDING MRCP) TECHNIQUE: Multiplanar multisequence MR imaging of the abdomen was performed both before and after the administration of intravenous contrast. Heavily T2-weighted images of the biliary and pancreatic ducts were obtained, and three-dimensional MRCP images were rendered by post processing. CONTRAST:  7.3mL GADAVIST  GADOBUTROL  1 MMOL/ML IV SOLN COMPARISON:  CT AP 11/26/2023 FINDINGS: Lower chest: No acute abnormality.  Heart size appears enlarged. Hepatobiliary: No suspicious liver lesion. Status post cholecystectomy. Increase caliber of the common bile duct measures 0.9 cm. The MRCP images are obscured by motion artifact. On the coronal T2 weighted sequences there is suggestion of 2 small stones within the distal CBD which measure up to 3 mm, image 14/3. This cannot be confirmed on either the MRCP images or the axial T2 weighted sequences. Pancreas: No mass, inflammatory changes, or other parenchymal abnormality identified. Spleen:  Atrophic and calcified spleen. Adrenals/Urinary Tract: Signal void artifact from spinal hardware no signs of hydronephrosis. Suggestion of indeterminate upper pole lesion off the lateral cortex of the right kidney measuring 1.7 cm, image 27/16. This appears T1 isointense, limits assessment of the adrenal glands. Cannot assess for underlying enhancement due to artifact created by spinal hardware. No additional focal kidney abnormality noted. Stomach/Bowel: Stomach appears nondistended.  No dilated loops of bowel noted. Vascular/Lymphatic: No pathologically enlarged lymph nodes identified. No abdominal aortic aneurysm demonstrated. Other:  No free fluid or fluid collections. Musculoskeletal: No suspicious bone  lesions identified. IMPRESSION: 1. Status post cholecystectomy. Increase caliber of the common bile duct measures 0.9 cm. The MRCP images are obscured by motion artifact. On the coronal T2 weighted sequences there is suggestion of 2 small stones within the distal CBD which measure up to 3 mm. This cannot be confirmed on either the MRCP images or the axial T2 weighted sequences. Consider further evaluation with ERCP. 2. Suggestion of indeterminate upper pole lesion off the lateral cortex of the right kidney measuring 1.7 cm. This appears T1 isointense, limits assessment of the adrenal glands. Cannot assess for underlying enhancement due to artifact created by spinal hardware. Consider further evaluation with renal sonogram. Electronically Signed   By: Waddell Calk M.D.   On: 11/27/2023 13:32   DG Chest 2 View Result Date: 11/26/2023 CLINICAL DATA:  Sickle cell.  Acute chest pain. EXAM: CHEST - 2 VIEW COMPARISON:  Chest x-ray 06/04/2023. CT abdomen and pelvis same day. FINDINGS: The heart is enlarged, unchanged. There is no focal lung infiltrate, pleural effusion or pneumothorax. No acute fractures are seen. Extensive thoracic spinal hardware present. IMPRESSION: Cardiomegaly. No acute cardiopulmonary process. Electronically Signed   By: Greig Pique M.D.   On: 11/26/2023 20:27   CT ABDOMEN PELVIS W CONTRAST Result Date: 11/26/2023 CLINICAL DATA:  Abdominal pain. Recent gallbladder surgery discharge 1 day prior. EXAM: CT ABDOMEN AND PELVIS WITH CONTRAST TECHNIQUE: Multidetector CT imaging of the abdomen and pelvis was performed using the standard protocol following bolus administration of intravenous contrast. RADIATION DOSE REDUCTION: This exam was performed according to the  departmental dose-optimization program which includes automated exposure control, adjustment of the mA and/or kV according to patient size and/or use of iterative reconstruction technique. CONTRAST:  OMNIPAQUE  IOHEXOL  300 MG/ML  SOLN COMPARISON:  CT 11/16/2023 FINDINGS: Lower chest: Mild nodular airspace disease in the lateral aspect the RIGHT lower lobe (image 10/series 6. Hepatobiliary: No focal hepatic lesion. Postcholecystectomy. No biliary dilatation. No abnormal fluid collections in the gallbladder fossa. No intrahepatic or extra hepatic biliary duct dilatation. Pancreas: Pancreas is normal. No ductal dilatation. No pancreatic inflammation. Spleen: Hyperdense small spleen suggest also infarction Adrenals/urinary tract: Adrenal glands and kidneys are normal. The ureters and bladder normal. Stomach/Bowel: Stomach, small bowel, appendix, and cecum are normal. The colon and rectosigmoid colon are normal. Vascular/Lymphatic: Abdominal aorta is normal caliber. No periportal or retroperitoneal adenopathy. No pelvic adenopathy. Reproductive: Prostate unremarkable Other: No free air or abscess in the abdomen pelvis. Musculoskeletal: LEFT hip internal fixation. Posterior lumbar fusion. Diffuse sclerosis. IMPRESSION: 1. Mild nodular airspace disease in the RIGHT lower lobe suggest mild aspiration pneumonitis or pneumonia. 2. Post cholecystectomy. No complicating features. No fluid collections or biliary obstruction. 3. 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 Electronically Signed   By: Jackquline Boxer M.D.   On: 11/26/2023 19:03   US  Abdomen Limited RUQ (LIVER/GB) Result Date: 11/20/2023 CLINICAL DATA:  [id number] Intractable abdominal pain [id number] Nausea AND vomiting [id number] EXAM: ULTRASOUND ABDOMEN LIMITED RIGHT UPPER QUADRANT COMPARISON:  November 16, 2023 FINDINGS: Gallbladder: Multiple layering gallstones. No wall thickening visualized. No sonographic Murphy sign noted by sonographer. Common  bile duct: Diameter: Visualized portion measures 7 mm, upper limits of normal. This is similar compared to prior CT. The majority is not visualized. Liver: No focal lesion identified. Within normal limits in parenchymal echogenicity. Portal vein is patent on color Doppler imaging with normal direction of blood flow towards the liver. Other: None. IMPRESSION: Cholelithiasis without sonographic evidence of acute cholecystitis. If there  is a persistent clinical concern for biliary obstruction, this would be better assessed with dedicated MRI with MRCP. Electronically Signed   By: Corean Salter M.D.   On: 11/20/2023 07:46   CT ABDOMEN PELVIS W CONTRAST Result Date: 11/16/2023 CLINICAL DATA:  Abdominal pain EXAM: CT ABDOMEN AND PELVIS WITHOUT CONTRAST TECHNIQUE: Multidetector CT imaging of the abdomen and pelvis was performed following the standard protocol without IV contrast. RADIATION DOSE REDUCTION: This exam was performed according to the departmental dose-optimization program which includes automated exposure control, adjustment of the mA and/or kV according to patient size and/or use of iterative reconstruction technique. COMPARISON:  CT abdomen and pelvis November 06, 2023 FINDINGS: Lower chest: No infiltrates or consolidations, no pleural effusions Hepatobiliary: No change gallstones previously described on prior CT. No focal hepatic lesions or biliary dilatation. The upper abdomen lower chest significantly limited due to metallic artifact from the thoracolumbar instrumentation. Pancreas: Pancreas normal size. No masses calcifications or inflammatory changes. Spleen: Spleen normal size.  No masses. Adrenals/Urinary Tract: Adrenal glands are normal size. Follow-up recommended. Kidneys are normal. No masses calcifications or hydronephrosis Stomach/Bowel: No small or large bowel obstruction or inflammatory changes. Moderate amount of residual fecal material throughout the colon without obstruction or  constipation. Vascular/Lymphatic: No significant vascular findings are present. No enlarged abdominal or pelvic lymph nodes. Reproductive: Prostate is unremarkable. Other: Anterior abdominal wall unremarkable without evidence of umbilical or inguinal hernias Musculoskeletal: Visualized portion of the thoracolumbar spine and pelvic structures grossly unremarkable without evidence of fracture bony abnormalities or soft tissue masses. IMPRESSION: *No acute findings in the abdomen or pelvis. *Cholelithiasis. *Moderate amount of residual fecal material throughout the colon without obstruction or constipation. Electronically Signed   By: Franky Chard M.D.   On: 11/16/2023 11:52    Microbiology: No results found for this or any previous visit (from the past 240 hours).   Labs: Basic Metabolic Panel: Recent Labs  Lab 12/08/23 2024 12/08/23 2100 12/08/23 2100 12/09/23 0452 12/10/23 0424 12/11/23 0437 12/12/23 0500  NA  --  138  --  138 137 134* 136  K  --  3.6   < > 3.7 4.0 3.7 3.8  CL  --  106  --  105 103 99 101  CO2  --  22  --  21* 25 26 26   GLUCOSE  --  111*  --  106* 104* 99 113*  BUN  --  12  --  11 10 9 15   CREATININE  --  0.92  --  0.86 0.91 0.65 0.73  CALCIUM  --  9.6  --  9.2 9.3 9.2 9.0  MG 2.5*  --   --  2.1  --   --   --   PHOS 4.8*  --   --  4.9*  --   --   --    < > = values in this interval not displayed.   Liver Function Tests: Recent Labs  Lab 12/08/23 2100 12/09/23 0452 12/10/23 0424 12/11/23 0437 12/12/23 0500  AST 105* 83* 72* 60* 45*  ALT 137* 108* 97* 80* 56*  ALKPHOS 141* 115 112 113 109  BILITOT 2.7* 2.3* 2.3* 2.7* 2.1*  PROT 10.0* 7.7 8.1 8.5* 7.9  ALBUMIN 4.7 3.7 3.7 4.1 3.7   Recent Labs  Lab 12/08/23 2100  LIPASE 40   No results for input(s): AMMONIA in the last 168 hours. CBC: Recent Labs  Lab 12/08/23 2100 12/09/23 0452 12/10/23 1137 12/12/23 0500 12/13/23 0441  WBC  9.9 11.1* 8.4 8.0 9.6  NEUTROABS 5.7 4.5  --  2.5  --   HGB 10.3*  8.4* 8.6* 8.2* 7.9*  HCT 28.6* 24.0* 23.9* 22.2* 21.6*  MCV 100.4* 101.7* 100.0 100.0 99.1  PLT 574* 446* 410* 348 308   Cardiac Enzymes: Recent Labs  Lab 12/08/23 2024  CKTOTAL 78   BNP: Invalid input(s): POCBNP CBG: No results for input(s): GLUCAP in the last 168 hours.  Time coordinating discharge: 50 minutes  Signed:  [name] Cover NP   [facility] 12/13/2023, 11:15 AM

## 2023-12-13 NOTE — Plan of Care (Signed)
   Problem: Health Behavior/Discharge Planning: Goal: Ability to manage health-related needs will improve Outcome: Progressing   Problem: Nutrition: Goal: Adequate nutrition will be maintained Outcome: Progressing

## 2023-12-14 NOTE — Hospital Course (Signed)
 Patient was admitted with constant abdominal pain with recent Lap Cholecystectomy, and sickle cell crisis admitted and GI consulted. Patient had elevated LFTs and 2 small choledocholithiasis. Symptomatic management was done and patient gradually improved and LFTs normalized. Patient was treated with IV Dilaudid  PCA and Toradol . Patient subsequently transitioned to home regimen at DC

## 2023-12-15 ENCOUNTER — Emergency Department (HOSPITAL_BASED_OUTPATIENT_CLINIC_OR_DEPARTMENT_OTHER)
Admission: EM | Admit: 2023-12-15 | Discharge: 2023-12-15 | Disposition: A | Discharge status: Home / Self Care | Attending: Emergency Medicine | Admitting: Emergency Medicine

## 2023-12-15 ENCOUNTER — Other Ambulatory Visit: Payer: Self-pay

## 2023-12-15 ENCOUNTER — Encounter (HOSPITAL_BASED_OUTPATIENT_CLINIC_OR_DEPARTMENT_OTHER): Payer: Self-pay | Admitting: Emergency Medicine

## 2023-12-15 DIAGNOSIS — D5700 Hb-SS disease with crisis, unspecified: Secondary | ICD-10-CM

## 2023-12-15 DIAGNOSIS — R1084 Generalized abdominal pain: Secondary | ICD-10-CM | POA: Diagnosis present

## 2023-12-15 DIAGNOSIS — D571 Sickle-cell disease without crisis: Secondary | ICD-10-CM | POA: Diagnosis not present

## 2023-12-15 DIAGNOSIS — R7401 Elevation of levels of liver transaminase levels: Secondary | ICD-10-CM | POA: Insufficient documentation

## 2023-12-15 LAB — COMPREHENSIVE METABOLIC PANEL WITH GFR
ALT: 50 U/L — ABNORMAL HIGH (ref 0–44)
AST: 47 U/L — ABNORMAL HIGH (ref 15–41)
Albumin: 4.6 g/dL (ref 3.5–5.0)
Alkaline Phosphatase: 139 U/L — ABNORMAL HIGH (ref 38–126)
Anion gap: 13 (ref 5–15)
BUN: 8 mg/dL (ref 6–20)
CO2: 23 mmol/L (ref 22–32)
Calcium: 9.6 mg/dL (ref 8.9–10.3)
Chloride: 105 mmol/L (ref 98–111)
Creatinine, Ser: 0.95 mg/dL (ref 0.61–1.24)
GFR, Estimated: >60 mL/min (ref >60)
Glucose, Bld: 111 mg/dL — ABNORMAL HIGH (ref 70–99)
Potassium: 3.6 mmol/L (ref 3.5–5.1)
Sodium: 142 mmol/L (ref 135–145)
Total Bilirubin: 2.1 mg/dL — ABNORMAL HIGH (ref 0.0–1.2)
Total Protein: 8.6 g/dL — ABNORMAL HIGH (ref 6.5–8.1)

## 2023-12-15 LAB — CBC WITH DIFFERENTIAL/PLATELET
Abs Immature Granulocytes: 0.07 K/uL (ref 0.00–0.07)
Basophils Absolute: 0 K/uL (ref 0.0–0.1)
Basophils Relative: 0 %
Eosinophils Absolute: 0.1 K/uL (ref 0.0–0.5)
Eosinophils Relative: 1 %
HCT: 23.8 % — ABNORMAL LOW (ref 39.0–52.0)
Hemoglobin: 8.9 g/dL — ABNORMAL LOW (ref 13.0–17.0)
Immature Granulocytes: 1 %
Lymphocytes Relative: 30 %
Lymphs Abs: 3.9 K/uL (ref 0.7–4.0)
MCH: 36 pg — ABNORMAL HIGH (ref 26.0–34.0)
MCHC: 37.4 g/dL — ABNORMAL HIGH (ref 30.0–36.0)
MCV: 96.4 fL (ref 80.0–100.0)
Monocytes Absolute: 1.8 K/uL — ABNORMAL HIGH (ref 0.1–1.0)
Monocytes Relative: 14 %
Neutro Abs: 7.1 K/uL (ref 1.7–7.7)
Neutrophils Relative %: 54 %
Platelets: 210 K/uL (ref 150–400)
RBC: 2.47 MIL/uL — ABNORMAL LOW (ref 4.22–5.81)
RDW: 16.9 % — ABNORMAL HIGH (ref 11.5–15.5)
Smear Review: NORMAL
WBC: 13 K/uL — ABNORMAL HIGH (ref 4.0–10.5)
nRBC: 0.2 % (ref 0.0–0.2)

## 2023-12-15 LAB — RETICULOCYTES
Immature Retic Fract: 35.8 % — ABNORMAL HIGH (ref 2.3–15.9)
RBC.: 2.5 MIL/uL — ABNORMAL LOW (ref 4.22–5.81)
Retic Count, Absolute: 30.3 K/uL (ref 19.0–186.0)
Retic Ct Pct: 1.2 % (ref 0.4–3.1)

## 2023-12-15 MED ORDER — KETOROLAC TROMETHAMINE 15 MG/ML IJ SOLN
15.0000 mg | INTRAMUSCULAR | Status: AC
  Administered 2023-12-15: 15 mg via INTRAVENOUS
  Filled 2023-12-15: qty 1

## 2023-12-15 MED ORDER — DEXTROSE-SODIUM CHLORIDE 5-0.45 % IV SOLN: INTRAVENOUS | Status: DC

## 2023-12-15 MED ORDER — DIPHENHYDRAMINE HCL 25 MG PO CAPS
25.0000 mg | ORAL_CAPSULE | Freq: Four times a day (QID) | ORAL | Status: DC | PRN
  Administered 2023-12-15: 25 mg via ORAL
  Filled 2023-12-15: qty 1

## 2023-12-15 MED ORDER — HYDROMORPHONE HCL 1 MG/ML IJ SOLN
2.0000 mg | INTRAMUSCULAR | Status: AC
  Administered 2023-12-15: 2 mg via INTRAVENOUS
  Filled 2023-12-15: qty 2

## 2023-12-15 MED ORDER — ONDANSETRON HCL 4 MG/2ML IJ SOLN
4.0000 mg | Freq: Once | INTRAMUSCULAR | Status: AC
  Administered 2023-12-15: 4 mg via INTRAVENOUS
  Filled 2023-12-15: qty 2

## 2023-12-15 NOTE — ED Provider Notes (Signed)
 [place] [facility] Provider Note   CSN: [id number] Arrival date & time: 12/15/23  0118     Patient presents with: Abdominal Pain and Emesis   Manuel Mcguire is a 33 y.o. male.   The history is provided by the patient.  Abdominal Pain Pain location:  Generalized Pain radiates to:  Does not radiate Pain severity:  Severe Onset quality:  Gradual Duration:  1 day Timing:  Constant Progression:  Worsening Chronicity:  Recurrent (Was discharged from [facility] on 12/13/23 for the same symptoms) Context: not laxative use and not trauma   Relieved by:  Nothing Associated symptoms: nausea and vomiting   Associated symptoms: no chest pain, no diarrhea and no fever   Risk factors: recent hospitalization   Risk factors: no NSAID use   Patient with sickle cell disease with ongoing pain since discharge from the hospital.  Patient reports he was told to follow up with [facility] for this but has not had an appointment to date.      Past Medical History:  Diagnosis Date   Scoliosis    Sickle cell anemia with pain (HCC)      Prior to Admission medications   Medication Sig Start Date End Date Taking? Authorizing Provider  cholecalciferol  (VITAMIN D3) 25 MCG (1000 UNIT) tablet Take 1,000 Units by mouth daily.    [provider]  folic acid  (FOLVITE ) 1 MG tablet Take 1 tablet (1 mg total) by mouth daily. 10/27/12   Alvia Rosaline LABOR, MD  hydroxyurea  (HYDREA ) 500 MG capsule Take 3 capsules (1,500 mg total) by mouth daily. May take with food to minimize GI side effects. 10/27/12   Alvia Rosaline LABOR, MD  lisinopril  (ZESTRIL ) 2.5 MG tablet Take 2.5 mg by mouth daily.    [provider]  metoCLOPramide  (REGLAN ) 10 MG tablet Take 1 tablet (10 mg total) by mouth 3 (three) times daily with meals. Patient taking differently: Take 10 mg by mouth 3 (three) times daily as needed for nausea or vomiting. 12/02/23 12/01/24  Cherylene Homer HERO, NP  ondansetron   (ZOFRAN -ODT) 4 MG disintegrating tablet 4mg  ODT q4 hours prn nausea/vomit 11/06/23   Patt Alm Macho, MD  oxyCODONE  (OXY IR/ROXICODONE ) 5 MG immediate release tablet Take 1 tablet (5 mg total) by mouth every 6 (six) hours as needed for severe pain (pain score 7-10). 12/02/23   Ijaola, Onyeje M, NP  pseudoephedrine (SUDAFED) 120 MG 12 hr tablet Take 120 mg by mouth every 12 (twelve) hours as needed for congestion.    [provider]    Allergies: Patient has no known allergies.    Review of Systems  Constitutional:  Negative for fever.  HENT:  Negative for facial swelling.   Respiratory:  Negative for wheezing and stridor.   Cardiovascular:  Negative for chest pain.  Gastrointestinal:  Positive for abdominal pain, nausea and vomiting. Negative for diarrhea.  Musculoskeletal:  Positive for arthralgias.  All other systems reviewed and are negative.   Updated Vital Signs BP 114/86   Pulse 93   Temp 99.1 F (37.3 C) (Oral)   Resp 18   Ht 5' 7 (1.702 m)   Wt 74 kg   SpO2 99%   BMI 25.55 kg/m   Physical Exam Vitals and nursing note reviewed.  Constitutional:      General: He is not in acute distress.    Appearance: Normal appearance. He is well-developed. He is not diaphoretic.  HENT:     Head: Normocephalic  and atraumatic.     Nose: Nose normal.  Eyes:     Conjunctiva/sclera: Conjunctivae normal.     Pupils: Pupils are equal, round, and reactive to light.  Cardiovascular:     Rate and Rhythm: Normal rate and regular rhythm.     Pulses: Normal pulses.     Heart sounds: Normal heart sounds.  Pulmonary:     Effort: Pulmonary effort is normal.     Breath sounds: Normal breath sounds. No wheezing or rales.  Abdominal:     General: Bowel sounds are normal.     Palpations: Abdomen is soft.     Tenderness: There is no abdominal tenderness. There is no guarding or rebound.  Musculoskeletal:        General: Normal range of motion.     Cervical back: Normal range of  motion and neck supple.  Skin:    General: Skin is warm and dry.     Capillary Refill: Capillary refill takes less than 2 seconds.  Neurological:     General: No focal deficit present.     Mental Status: He is alert and oriented to person, place, and time.     Deep Tendon Reflexes: Reflexes normal.  Psychiatric:        Mood and Affect: Mood normal.     (all labs ordered are listed, but only abnormal results are displayed) Results for orders placed or performed during the hospital encounter of 12/15/23  CBC WITH DIFFERENTIAL   Collection Time: 12/15/23  1:50 AM  Result Value Ref Range   WBC 13.0 (H) 4.0 - 10.5 K/uL   RBC 2.47 (L) 4.22 - 5.81 MIL/uL   Hemoglobin 8.9 (L) 13.0 - 17.0 g/dL   HCT 76.1 (L) 60.9 - 47.9 %   MCV 96.4 80.0 - 100.0 fL   MCH 36.0 (H) 26.0 - 34.0 pg   MCHC 37.4 (H) 30.0 - 36.0 g/dL   RDW 83.0 (H) 88.4 - 84.4 %   Platelets 210 150 - 400 K/uL   nRBC 0.2 0.0 - 0.2 %   Neutrophils Relative % 54 %   Neutro Abs 7.1 1.7 - 7.7 K/uL   Lymphocytes Relative 30 %   Lymphs Abs 3.9 0.7 - 4.0 K/uL   Monocytes Relative 14 %   Monocytes Absolute 1.8 (H) 0.1 - 1.0 K/uL   Eosinophils Relative 1 %   Eosinophils Absolute 0.1 0.0 - 0.5 K/uL   Basophils Relative 0 %   Basophils Absolute 0.0 0.0 - 0.1 K/uL   WBC Morphology MORPHOLOGY UNREMARKABLE    Smear Review Normal platelet morphology    Immature Granulocytes 1 %   Abs Immature Granulocytes 0.07 0.00 - 0.07 K/uL   Joeann Seen Bodies PRESENT    Sickle Cells PRESENT    Target Cells PRESENT   Reticulocytes   Collection Time: 12/15/23  1:50 AM  Result Value Ref Range   Retic Ct Pct 1.2 0.4 - 3.1 %   RBC. 2.50 (L) 4.22 - 5.81 MIL/uL   Retic Count, Absolute 30.3 19.0 - 186.0 K/uL   Immature Retic Fract 35.8 (H) 2.3 - 15.9 %  Comprehensive metabolic panel with GFR   Collection Time: 12/15/23  2:38 AM  Result Value Ref Range   Sodium 142 135 - 145 mmol/L   Potassium 3.6 3.5 - 5.1 mmol/L   Chloride 105 98 - 111 mmol/L    CO2 23 22 - 32 mmol/L   Glucose, Bld 111 (H) 70 - 99 mg/dL   BUN  8 6 - 20 mg/dL   Creatinine, Ser 9.04 0.61 - 1.24 mg/dL   Calcium 9.6 8.9 - 89.6 mg/dL   Total Protein 8.6 (H) 6.5 - 8.1 g/dL   Albumin 4.6 3.5 - 5.0 g/dL   AST 47 (H) 15 - 41 U/L   ALT 50 (H) 0 - 44 U/L   Alkaline Phosphatase 139 (H) 38 - 126 U/L   Total Bilirubin 2.1 (H) 0.0 - 1.2 mg/dL   GFR, Estimated >39 >39 mL/min   Anion gap 13 5 - 15   MR ABDOMEN MRCP W WO CONTAST Result Date: 12/11/2023 CLINICAL DATA:  Abdominal pain, epigastric pain, recent cholecystectomy EXAM: MRI ABDOMEN WITHOUT AND WITH CONTRAST (INCLUDING MRCP) TECHNIQUE: Multiplanar multisequence MR imaging of the abdomen was performed both before and after the administration of intravenous contrast. Heavily T2-weighted images of the biliary and pancreatic ducts were obtained, and three-dimensional MRCP images were rendered by post processing. CONTRAST:  7mL GADAVIST  GADOBUTROL  1 MMOL/ML IV SOLN COMPARISON:  11/27/2023 FINDINGS: Metallic susceptibility artifact from thoracolumbar fusion significantly degrades multiple sequences submitted for review, particularly fat saturated T2 and multiphasic contrast enhanced sequences. Lower chest: No acute abnormality.  Cardiomegaly. Hepatobiliary: No focal liver abnormality is seen. Status post cholecystectomy. Mild dilatation of the common bile duct measuring up to 0.8 cm, tapering to the ampulla without calculus or other obstruction visible (series 3, image 13). Pancreas: Unremarkable. No pancreatic ductal dilatation or surrounding inflammatory changes. Spleen: Normal in size without significant abnormality. Adrenals/Urinary Tract: Adrenal glands are unremarkable. Lesion of the peripheral superior pole of the right kidney is more clearly characterized as a benign, nonenhancing hemorrhagic or proteinaceous cyst on today's examination. Kidneys are otherwise normal, without obvious renal calculi, solid lesion, or hydronephrosis.  Stomach/Bowel: Stomach is within normal limits. No evidence of bowel wall thickening, distention, or inflammatory changes. Vascular/Lymphatic: No significant vascular findings are present. No enlarged abdominal lymph nodes. Other: No abdominal wall hernia or abnormality. No ascites. Musculoskeletal: No acute or significant osseous findings. Metallic susceptibility artifact secondary to thoracolumbar fusion. IMPRESSION: 1. Metallic susceptibility artifact from thoracolumbar fusion significantly degrades multiple sequences submitted for review, particularly fat saturated T2 and multiphasic contrast enhanced sequences. 2. Status post cholecystectomy. 3. Mild dilatation of the common bile duct measuring up to 0.8 cm, tapering to the ampulla without calculus or other obstruction visible. 4. Cardiomegaly. Electronically Signed   By: Marolyn JONETTA Jaksch M.D.   On: 12/11/2023 15:37   MR 3D Recon At Scanner Result Date: 12/11/2023 CLINICAL DATA:  Abdominal pain, epigastric pain, recent cholecystectomy EXAM: MRI ABDOMEN WITHOUT AND WITH CONTRAST (INCLUDING MRCP) TECHNIQUE: Multiplanar multisequence MR imaging of the abdomen was performed both before and after the administration of intravenous contrast. Heavily T2-weighted images of the biliary and pancreatic ducts were obtained, and three-dimensional MRCP images were rendered by post processing. CONTRAST:  7mL GADAVIST  GADOBUTROL  1 MMOL/ML IV SOLN COMPARISON:  11/27/2023 FINDINGS: Metallic susceptibility artifact from thoracolumbar fusion significantly degrades multiple sequences submitted for review, particularly fat saturated T2 and multiphasic contrast enhanced sequences. Lower chest: No acute abnormality.  Cardiomegaly. Hepatobiliary: No focal liver abnormality is seen. Status post cholecystectomy. Mild dilatation of the common bile duct measuring up to 0.8 cm, tapering to the ampulla without calculus or other obstruction visible (series 3, image 13). Pancreas: Unremarkable.  No pancreatic ductal dilatation or surrounding inflammatory changes. Spleen: Normal in size without significant abnormality. Adrenals/Urinary Tract: Adrenal glands are unremarkable. Lesion of the peripheral superior pole of the right kidney is more clearly  characterized as a benign, nonenhancing hemorrhagic or proteinaceous cyst on today's examination. Kidneys are otherwise normal, without obvious renal calculi, solid lesion, or hydronephrosis. Stomach/Bowel: Stomach is within normal limits. No evidence of bowel wall thickening, distention, or inflammatory changes. Vascular/Lymphatic: No significant vascular findings are present. No enlarged abdominal lymph nodes. Other: No abdominal wall hernia or abnormality. No ascites. Musculoskeletal: No acute or significant osseous findings. Metallic susceptibility artifact secondary to thoracolumbar fusion. IMPRESSION: 1. Metallic susceptibility artifact from thoracolumbar fusion significantly degrades multiple sequences submitted for review, particularly fat saturated T2 and multiphasic contrast enhanced sequences. 2. Status post cholecystectomy. 3. Mild dilatation of the common bile duct measuring up to 0.8 cm, tapering to the ampulla without calculus or other obstruction visible. 4. Cardiomegaly. Electronically Signed   By: Marolyn JONETTA Jaksch M.D.   On: 12/11/2023 15:37   US  Abdomen Limited RUQ (LIVER/GB) Result Date: 12/09/2023 CLINICAL DATA:  History of prior cholecystectomy on November 22, 2023, presenting with right upper quadrant pain. EXAM: ULTRASOUND ABDOMEN LIMITED RIGHT UPPER QUADRANT COMPARISON:  November 20, 2023 FINDINGS: Gallbladder: The gallbladder is surgically absent. Common bile duct: Diameter: 6.8 mm Liver: No focal lesion identified. Diffusely increased echogenicity of the liver parenchyma is noted. Portal vein is patent on color Doppler imaging with normal direction of blood flow towards the liver. Other: None. IMPRESSION: 1. Findings consistent with prior  cholecystectomy. 2. Hepatic steatosis. Electronically Signed   By: Suzen Dials M.D.   On: 12/09/2023 00:52   MR ABDOMEN MRCP W WO CONTAST Result Date: 11/27/2023 CLINICAL DATA:  Jaundice.  Status post cholecystectomy EXAM: MRI ABDOMEN WITHOUT AND WITH CONTRAST (INCLUDING MRCP) TECHNIQUE: Multiplanar multisequence MR imaging of the abdomen was performed both before and after the administration of intravenous contrast. Heavily T2-weighted images of the biliary and pancreatic ducts were obtained, and three-dimensional MRCP images were rendered by post processing. CONTRAST:  7.3mL GADAVIST  GADOBUTROL  1 MMOL/ML IV SOLN COMPARISON:  CT AP 11/26/2023 FINDINGS: Lower chest: No acute abnormality.  Heart size appears enlarged. Hepatobiliary: No suspicious liver lesion. Status post cholecystectomy. Increase caliber of the common bile duct measures 0.9 cm. The MRCP images are obscured by motion artifact. On the coronal T2 weighted sequences there is suggestion of 2 small stones within the distal CBD which measure up to 3 mm, image 14/3. This cannot be confirmed on either the MRCP images or the axial T2 weighted sequences. Pancreas: No mass, inflammatory changes, or other parenchymal abnormality identified. Spleen:  Atrophic and calcified spleen. Adrenals/Urinary Tract: Signal void artifact from spinal hardware no signs of hydronephrosis. Suggestion of indeterminate upper pole lesion off the lateral cortex of the right kidney measuring 1.7 cm, image 27/16. This appears T1 isointense, limits assessment of the adrenal glands. Cannot assess for underlying enhancement due to artifact created by spinal hardware. No additional focal kidney abnormality noted. Stomach/Bowel: Stomach appears nondistended. No dilated loops of bowel noted. Vascular/Lymphatic: No pathologically enlarged lymph nodes identified. No abdominal aortic aneurysm demonstrated. Other:  No free fluid or fluid collections. Musculoskeletal: No suspicious bone  lesions identified. IMPRESSION: 1. Status post cholecystectomy. Increase caliber of the common bile duct measures 0.9 cm. The MRCP images are obscured by motion artifact. On the coronal T2 weighted sequences there is suggestion of 2 small stones within the distal CBD which measure up to 3 mm. This cannot be confirmed on either the MRCP images or the axial T2 weighted sequences. Consider further evaluation with ERCP. 2. Suggestion of indeterminate upper pole lesion off the lateral cortex of  the right kidney measuring 1.7 cm. This appears T1 isointense, limits assessment of the adrenal glands. Cannot assess for underlying enhancement due to artifact created by spinal hardware. Consider further evaluation with renal sonogram. Electronically Signed   By: Waddell Calk M.D.   On: 11/27/2023 13:32   DG Chest 2 View Result Date: 11/26/2023 CLINICAL DATA:  Sickle cell.  Acute chest pain. EXAM: CHEST - 2 VIEW COMPARISON:  Chest x-ray 06/04/2023. CT abdomen and pelvis same day. FINDINGS: The heart is enlarged, unchanged. There is no focal lung infiltrate, pleural effusion or pneumothorax. No acute fractures are seen. Extensive thoracic spinal hardware present. IMPRESSION: Cardiomegaly. No acute cardiopulmonary process. Electronically Signed   By: Greig Pique M.D.   On: 11/26/2023 20:27   CT ABDOMEN PELVIS W CONTRAST Result Date: 11/26/2023 CLINICAL DATA:  Abdominal pain. Recent gallbladder surgery discharge 1 day prior. EXAM: CT ABDOMEN AND PELVIS WITH CONTRAST TECHNIQUE: Multidetector CT imaging of the abdomen and pelvis was performed using the standard protocol following bolus administration of intravenous contrast. RADIATION DOSE REDUCTION: This exam was performed according to the departmental dose-optimization program which includes automated exposure control, adjustment of the mA and/or kV according to patient size and/or use of iterative reconstruction technique. CONTRAST:  OMNIPAQUE  IOHEXOL  300 MG/ML   SOLN COMPARISON:  CT 11/16/2023 FINDINGS: Lower chest: Mild nodular airspace disease in the lateral aspect the RIGHT lower lobe (image 10/series 6. Hepatobiliary: No focal hepatic lesion. Postcholecystectomy. No biliary dilatation. No abnormal fluid collections in the gallbladder fossa. No intrahepatic or extra hepatic biliary duct dilatation. Pancreas: Pancreas is normal. No ductal dilatation. No pancreatic inflammation. Spleen: Hyperdense small spleen suggest also infarction Adrenals/urinary tract: Adrenal glands and kidneys are normal. The ureters and bladder normal. Stomach/Bowel: Stomach, small bowel, appendix, and cecum are normal. The colon and rectosigmoid colon are normal. Vascular/Lymphatic: Abdominal aorta is normal caliber. No periportal or retroperitoneal adenopathy. No pelvic adenopathy. Reproductive: Prostate unremarkable Other: No free air or abscess in the abdomen pelvis. Musculoskeletal: LEFT hip internal fixation. Posterior lumbar fusion. Diffuse sclerosis. IMPRESSION: 1. Mild nodular airspace disease in the RIGHT lower lobe suggest mild aspiration pneumonitis or pneumonia. 2. Post cholecystectomy. No complicating features. No fluid collections or biliary obstruction. 3. 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 Electronically Signed   By: Jackquline Boxer M.D.   On: 11/26/2023 19:03   US  Abdomen Limited RUQ (LIVER/GB) Result Date: 11/20/2023 CLINICAL DATA:  [id number] Intractable abdominal pain [id number] Nausea AND vomiting [id number] EXAM: ULTRASOUND ABDOMEN LIMITED RIGHT UPPER QUADRANT COMPARISON:  November 16, 2023 FINDINGS: Gallbladder: Multiple layering gallstones. No wall thickening visualized. No sonographic Murphy sign noted by sonographer. Common bile duct: Diameter: Visualized portion measures 7 mm, upper limits of normal. This is similar compared to prior CT. The majority is not visualized. Liver: No focal lesion identified. Within normal limits in parenchymal echogenicity.  Portal vein is patent on color Doppler imaging with normal direction of blood flow towards the liver. Other: None. IMPRESSION: Cholelithiasis without sonographic evidence of acute cholecystitis. If there is a persistent clinical concern for biliary obstruction, this would be better assessed with dedicated MRI with MRCP. Electronically Signed   By: Corean Salter M.D.   On: 11/20/2023 07:46   CT ABDOMEN PELVIS W CONTRAST Result Date: 11/16/2023 CLINICAL DATA:  Abdominal pain EXAM: CT ABDOMEN AND PELVIS WITHOUT CONTRAST TECHNIQUE: Multidetector CT imaging of the abdomen and pelvis was performed following the standard protocol without IV contrast. RADIATION DOSE REDUCTION: This exam was performed according  to the departmental dose-optimization program which includes automated exposure control, adjustment of the mA and/or kV according to patient size and/or use of iterative reconstruction technique. COMPARISON:  CT abdomen and pelvis November 06, 2023 FINDINGS: Lower chest: No infiltrates or consolidations, no pleural effusions Hepatobiliary: No change gallstones previously described on prior CT. No focal hepatic lesions or biliary dilatation. The upper abdomen lower chest significantly limited due to metallic artifact from the thoracolumbar instrumentation. Pancreas: Pancreas normal size. No masses calcifications or inflammatory changes. Spleen: Spleen normal size.  No masses. Adrenals/Urinary Tract: Adrenal glands are normal size. Follow-up recommended. Kidneys are normal. No masses calcifications or hydronephrosis Stomach/Bowel: No small or large bowel obstruction or inflammatory changes. Moderate amount of residual fecal material throughout the colon without obstruction or constipation. Vascular/Lymphatic: No significant vascular findings are present. No enlarged abdominal or pelvic lymph nodes. Reproductive: Prostate is unremarkable. Other: Anterior abdominal wall unremarkable without evidence of umbilical or  inguinal hernias Musculoskeletal: Visualized portion of the thoracolumbar spine and pelvic structures grossly unremarkable without evidence of fracture bony abnormalities or soft tissue masses. IMPRESSION: *No acute findings in the abdomen or pelvis. *Cholelithiasis. *Moderate amount of residual fecal material throughout the colon without obstruction or constipation. Electronically Signed   By: Franky Chard M.D.   On: 11/16/2023 11:52     Radiology: No results found.   Procedures   Medications Ordered in the ED  dextrose  5 % and 0.45 % NaCl infusion ( Intravenous New Bag/Given 12/15/23 0203)  HYDROmorphone  (DILAUDID ) injection 2 mg (has no administration in time range)  diphenhydrAMINE  (BENADRYL ) capsule 25 mg (25 mg Oral Given 12/15/23 0203)  ketorolac  (TORADOL ) 15 MG/ML injection 15 mg (15 mg Intravenous Given 12/15/23 0204)  HYDROmorphone  (DILAUDID ) injection 2 mg (2 mg Intravenous Given 12/15/23 0204)  HYDROmorphone  (DILAUDID ) injection 2 mg (2 mg Intravenous Given 12/15/23 0318)  ondansetron  (ZOFRAN ) injection 4 mg (4 mg Intravenous Given 12/15/23 0217)                                    Medical Decision Making Amount and/or Complexity of Data Reviewed Labs: ordered.    Details: Normal sodium 142, normal potassium 3.6, normal creatinine 0.95, elevated AST 47 and ALT 50 and bilirubin 2.. White count slight elevation 13, hemoglobin slight low 8.9, normal platelets.   Risk Prescription drug management. Risk Details: Ongoing abdominal pain post hospitalization.  Was in too much pain to go to [facility] so came here. [facility] is not accepting transfers at this time, transfer center was contacted.  Given pain patient was consulted to hospitalists for admission and then declined admission citing prior commitments.  Will be discharged.  Follow up with your sickle cell team       Final diagnoses:  Sickle-cell disease with pain (HCC)  Elevated transaminase level   No signs of systemic illness or  infection. The patient is nontoxic-appearing on exam and vital signs are within normal limits.  I have reviewed the triage vital signs and the nursing notes. Pertinent labs & imaging results that were available during my care of the patient were reviewed by me and considered in my medical decision making (see chart for details). After history, exam, and medical workup I feel the patient has been appropriately medically screened and is safe for discharge home. Pertinent diagnoses were discussed with the patient. Patient was given return precautions.    ED Discharge Orders  None          Phelicia Dantes, MD 12/15/23 [phone]

## 2023-12-15 NOTE — ED Triage Notes (Signed)
 Pt states nausea emesis, abdominal pain and sickle cell crisis X 1 day.

## 2023-12-16 ENCOUNTER — Encounter (HOSPITAL_BASED_OUTPATIENT_CLINIC_OR_DEPARTMENT_OTHER): Payer: Self-pay

## 2023-12-16 ENCOUNTER — Other Ambulatory Visit: Payer: Self-pay

## 2023-12-16 ENCOUNTER — Emergency Department (HOSPITAL_BASED_OUTPATIENT_CLINIC_OR_DEPARTMENT_OTHER)
Admission: EM | Admit: 2023-12-16 | Discharge: 2023-12-17 | Disposition: A | Discharge status: Home / Self Care | Attending: Emergency Medicine | Admitting: Emergency Medicine

## 2023-12-16 ENCOUNTER — Emergency Department (HOSPITAL_BASED_OUTPATIENT_CLINIC_OR_DEPARTMENT_OTHER)

## 2023-12-16 DIAGNOSIS — D735 Infarction of spleen: Secondary | ICD-10-CM | POA: Diagnosis not present

## 2023-12-16 DIAGNOSIS — E876 Hypokalemia: Secondary | ICD-10-CM | POA: Diagnosis not present

## 2023-12-16 DIAGNOSIS — R1084 Generalized abdominal pain: Secondary | ICD-10-CM

## 2023-12-16 DIAGNOSIS — D5700 Hb-SS disease with crisis, unspecified: Secondary | ICD-10-CM | POA: Diagnosis not present

## 2023-12-16 DIAGNOSIS — K838 Other specified diseases of biliary tract: Secondary | ICD-10-CM | POA: Diagnosis not present

## 2023-12-16 LAB — COMPREHENSIVE METABOLIC PANEL WITH GFR
ALT: 39 U/L (ref 0–44)
AST: 36 U/L (ref 15–41)
Albumin: 4.3 g/dL (ref 3.5–5.0)
Alkaline Phosphatase: 132 U/L — ABNORMAL HIGH (ref 38–126)
Anion gap: 14 (ref 5–15)
BUN: 8 mg/dL (ref 6–20)
CO2: 22 mmol/L (ref 22–32)
Calcium: 9.3 mg/dL (ref 8.9–10.3)
Chloride: 105 mmol/L (ref 98–111)
Creatinine, Ser: 0.85 mg/dL (ref 0.61–1.24)
GFR, Estimated: >60 mL/min (ref >60)
Glucose, Bld: 124 mg/dL — ABNORMAL HIGH (ref 70–99)
Potassium: 3.4 mmol/L — ABNORMAL LOW (ref 3.5–5.1)
Sodium: 141 mmol/L (ref 135–145)
Total Bilirubin: 1.7 mg/dL — ABNORMAL HIGH (ref 0.0–1.2)
Total Protein: 8.4 g/dL — ABNORMAL HIGH (ref 6.5–8.1)

## 2023-12-16 LAB — CBC WITH DIFFERENTIAL/PLATELET
Abs Immature Granulocytes: 0.03 K/uL (ref 0.00–0.07)
Basophils Absolute: 0.1 K/uL (ref 0.0–0.1)
Basophils Relative: 1 %
Eosinophils Absolute: 0.1 K/uL (ref 0.0–0.5)
Eosinophils Relative: 1 %
HCT: 22 % — ABNORMAL LOW (ref 39.0–52.0)
Hemoglobin: 8.2 g/dL — ABNORMAL LOW (ref 13.0–17.0)
Immature Granulocytes: 0 %
Lymphocytes Relative: 44 %
Lymphs Abs: 4.6 K/uL — ABNORMAL HIGH (ref 0.7–4.0)
MCH: 36.4 pg — ABNORMAL HIGH (ref 26.0–34.0)
MCHC: 37.3 g/dL — ABNORMAL HIGH (ref 30.0–36.0)
MCV: 97.8 fL (ref 80.0–100.0)
Monocytes Absolute: 1.7 K/uL — ABNORMAL HIGH (ref 0.1–1.0)
Monocytes Relative: 16 %
Neutro Abs: 4 K/uL (ref 1.7–7.7)
Neutrophils Relative %: 38 %
Platelets: 151 K/uL (ref 150–400)
RBC: 2.25 MIL/uL — ABNORMAL LOW (ref 4.22–5.81)
RDW: 16.9 % — ABNORMAL HIGH (ref 11.5–15.5)
Smear Review: NORMAL
WBC: 10.4 K/uL (ref 4.0–10.5)
nRBC: 0.9 % — ABNORMAL HIGH (ref 0.0–0.2)

## 2023-12-16 LAB — LIPASE, BLOOD: Lipase: 43 U/L (ref 11–51)

## 2023-12-16 LAB — RETICULOCYTES
Immature Retic Fract: 40.1 % — ABNORMAL HIGH (ref 2.3–15.9)
RBC.: 2.28 MIL/uL — ABNORMAL LOW (ref 4.22–5.81)
Retic Count, Absolute: 54.5 K/uL (ref 19.0–186.0)
Retic Ct Pct: 2.4 % (ref 0.4–3.1)

## 2023-12-16 MED ORDER — HYDROMORPHONE HCL 1 MG/ML IJ SOLN
2.0000 mg | INTRAMUSCULAR | Status: AC
  Administered 2023-12-16: 2 mg via INTRAVENOUS
  Filled 2023-12-16: qty 2

## 2023-12-16 MED ORDER — IOHEXOL 300 MG/ML  SOLN
100.0000 mL | Freq: Once | INTRAMUSCULAR | Status: AC | PRN
  Administered 2023-12-16: 100 mL via INTRAVENOUS

## 2023-12-16 MED ORDER — DEXTROSE-SODIUM CHLORIDE 5-0.45 % IV SOLN: INTRAVENOUS | Status: DC

## 2023-12-16 MED ORDER — ONDANSETRON HCL 4 MG/2ML IJ SOLN
4.0000 mg | INTRAMUSCULAR | Status: DC | PRN
  Administered 2023-12-16: 4 mg via INTRAVENOUS
  Filled 2023-12-16: qty 2

## 2023-12-16 MED ORDER — SODIUM CHLORIDE 0.9 % IV SOLN
12.5000 mg | Freq: Once | INTRAVENOUS | Status: AC
  Administered 2023-12-16: 12.5 mg via INTRAVENOUS
  Filled 2023-12-16: qty 0.25

## 2023-12-16 MED ORDER — DIPHENHYDRAMINE HCL 50 MG/ML IJ SOLN
INTRAMUSCULAR | Status: AC
  Administered 2023-12-16: 12.5 mg
  Filled 2023-12-16: qty 1

## 2023-12-16 MED ORDER — HYDROMORPHONE HCL 1 MG/ML IJ SOLN
1.0000 mg | Freq: Once | INTRAMUSCULAR | Status: AC
  Administered 2023-12-16: 1 mg via SUBCUTANEOUS
  Filled 2023-12-16: qty 1

## 2023-12-16 NOTE — ED Provider Notes (Addendum)
 [place] [facility] Provider Note   CSN: [id number] Arrival date & time: 12/16/23  2018     Patient presents with: Sickle Cell Pain Crisis   Manuel Mcguire is a 33 y.o. male.   Patient here with a complaint of sickle cell pain abdominal pain also back pain nausea vomiting.  Patient was seen Wednesday going into Thursday with recommendation for admission but patient refused.  Patient denies any fevers.  Temp here 98 pulse 89 respirations 20 blood pressure 1 5.  Chart review shows that patient was seen July 3 with recommendation for admission.  Will start sickle cell protocol.       Prior to Admission medications   Medication Sig Start Date End Date Taking? Authorizing Provider  cholecalciferol  (VITAMIN D3) 25 MCG (1000 UNIT) tablet Take 1,000 Units by mouth daily.    [provider]  folic acid  (FOLVITE ) 1 MG tablet Take 1 tablet (1 mg total) by mouth daily. 10/27/12   Alvia Rosaline LABOR, MD  hydroxyurea  (HYDREA ) 500 MG capsule Take 3 capsules (1,500 mg total) by mouth daily. May take with food to minimize GI side effects. 10/27/12   Alvia Rosaline LABOR, MD  lisinopril  (ZESTRIL ) 2.5 MG tablet Take 2.5 mg by mouth daily.    [provider]  metoCLOPramide  (REGLAN ) 10 MG tablet Take 1 tablet (10 mg total) by mouth 3 (three) times daily with meals. Patient taking differently: Take 10 mg by mouth 3 (three) times daily as needed for nausea or vomiting. 12/02/23 12/01/24  Cherylene Homer HERO, NP  ondansetron  (ZOFRAN -ODT) 4 MG disintegrating tablet 4mg  ODT q4 hours prn nausea/vomit 11/06/23   Patt Alm Macho, MD  oxyCODONE  (OXY IR/ROXICODONE ) 5 MG immediate release tablet Take 1 tablet (5 mg total) by mouth every 6 (six) hours as needed for severe pain (pain score 7-10). 12/02/23   Ijaola, Onyeje M, NP  pseudoephedrine (SUDAFED) 120 MG 12 hr tablet Take 120 mg by mouth every 12 (twelve) hours as needed for congestion.    [provider]    Allergies: Patient has no known allergies.    Review of Systems  Constitutional:  Negative for chills and fever.  HENT:  Negative for ear pain and sore throat.   Eyes:  Negative for pain and visual disturbance.  Respiratory:  Negative for cough and shortness of breath.   Cardiovascular:  Negative for chest pain and palpitations.  Gastrointestinal:  Positive for abdominal pain, nausea and vomiting.  Genitourinary:  Negative for dysuria and hematuria.  Musculoskeletal:  Positive for back pain. Negative for arthralgias.  Skin:  Negative for color change and rash.  Neurological:  Negative for seizures and syncope.  All other systems reviewed and are negative.   Updated Vital Signs BP 121/85 (BP Location: Left Arm)   Pulse 89   Temp 98 F (36.7 C) (Oral)   Resp 20   Ht 1.702 m (5' 7)   Wt 72.6 kg   SpO2 98%   BMI 25.06 kg/m   Physical Exam Vitals and nursing note reviewed.  Constitutional:      General: He is not in acute distress.    Appearance: Normal appearance. He is well-developed.  HENT:     Head: Normocephalic and atraumatic.  Eyes:     Extraocular Movements: Extraocular movements intact.     Conjunctiva/sclera: Conjunctivae normal.     Pupils: Pupils are equal, round, and reactive to light.  Cardiovascular:     Rate and  Rhythm: Normal rate and regular rhythm.     Heart sounds: No murmur heard. Pulmonary:     Effort: Pulmonary effort is normal. No respiratory distress.     Breath sounds: Normal breath sounds.  Abdominal:     Palpations: Abdomen is soft.     Tenderness: There is abdominal tenderness.     Comments: Diffuse abdominal tenderness  Musculoskeletal:        General: No swelling.     Cervical back: Neck supple.  Skin:    General: Skin is warm and dry.     Capillary Refill: Capillary refill takes less than 2 seconds.  Neurological:     General: No focal deficit present.     Mental Status: He is alert and oriented to person,  place, and time.  Psychiatric:        Mood and Affect: Mood normal.     (all labs ordered are listed, but only abnormal results are displayed) Labs Reviewed  CBC WITH DIFFERENTIAL/PLATELET - Abnormal; Notable for the following components:      Result Value   RBC 2.25 (*)    Hemoglobin 8.2 (*)    HCT 22.0 (*)    MCH 36.4 (*)    MCHC 37.3 (*)    RDW 16.9 (*)    nRBC 0.9 (*)    Lymphs Abs 4.6 (*)    Monocytes Absolute 1.7 (*)    All other components within normal limits  RETICULOCYTES - Abnormal; Notable for the following components:   RBC. 2.28 (*)    Immature Retic Fract 40.1 (*)    All other components within normal limits  COMPREHENSIVE METABOLIC PANEL WITH GFR  LIPASE, BLOOD    EKG: None  Radiology: No results found.   Procedures   Medications Ordered in the ED  dextrose  5 % and 0.45 % NaCl infusion (has no administration in time range)  diphenhydrAMINE  (BENADRYL ) 12.5 mg in sodium chloride  0.9 % 50 mL IVPB (has no administration in time range)  HYDROmorphone  (DILAUDID ) injection 2 mg (has no administration in time range)  HYDROmorphone  (DILAUDID ) injection 2 mg (has no administration in time range)  HYDROmorphone  (DILAUDID ) injection 2 mg (has no administration in time range)  ondansetron  (ZOFRAN ) injection 4 mg (has no administration in time range)  HYDROmorphone  (DILAUDID ) injection 1 mg (1 mg Subcutaneous Given 12/16/23 2121)                                    Medical Decision Making Amount and/or Complexity of Data Reviewed Labs: ordered. Radiology: ordered.  Risk Prescription drug management.  Patient here with a complaint of abdominal pain and back pain.  Thinks it is related to his sickle cell crisis.  The patient does not usually get pain in the abdomen.  CBC here white count 10.4 hemoglobin 8.2 platelets 151.  Reticular cell count is percentage is normal at 2.4.  Complete metabolic panel lipase pending.  Will do CT scan abdomen and pelvis will  get chest x-ray as well.  Will begin sickle cell protocol.  CRITICAL CARE Performed by: Kassadie Pancake Total critical care time: 45 minutes Critical care time was exclusive of separately billable procedures and treating other patients. Critical care was necessary to treat or prevent imminent or life-threatening deterioration. Critical care was time spent personally by me on the following activities: development of treatment plan with patient and/or surrogate as well as nursing, discussions with  consultants, evaluation of patient's response to treatment, examination of patient, obtaining history from patient or surrogate, ordering and performing treatments and interventions, ordering and review of laboratory studies, ordering and review of radiographic studies, pulse oximetry and re-evaluation of patient's condition.  Patient's chest x-ray without any acute findings.  CT scan abdomen pelvis pending. Patient still states that fair amount of pain.  But he has not finished his protocol yet.  CT scan abdomen and pelvis no acute abnormalities mild intrahepatic extrahepatic biliary dilatation slightly increased from the MRI on July 6 this may be due to post cholecystectomy state correlate with LFTs.  Patient's LFTs are not significantly worse than they were recently.  Last ones were done on the 10thsignificant change from 10th to today.  Patient still with ongoing pain.  Has not completed the protocol.  Will have him reassessed once he is received all of the medication.  Final diagnoses:  Sickle cell pain crisis [facility])  Generalized abdominal pain    ED Discharge Orders     None          Geraldene Hamilton, MD 12/16/23 2125    Geraldene Hamilton, MD 12/16/23 2251    Geraldene Hamilton, MD 12/16/23 7745    Geraldene Hamilton, MD 12/16/23 [phone]

## 2023-12-16 NOTE — ED Triage Notes (Signed)
 Pt states he is having nausea & vomiting with a sickle cell crisis x 1 day. Reports abdominal & back pain. Denies any other s/s at time of triage.

## 2023-12-17 NOTE — ED Provider Notes (Signed)
 Blood pressure 130/89, pulse (!) 118, temperature 98 F (36.7 C), temperature source Oral, resp. rate 17, height 5' 7 (1.702 m), weight 72.6 kg, SpO2 96%.  Assuming care from Dr. Zackowski.  In short, Manuel Mcguire is a 33 y.o. male with a chief complaint of Sickle Cell Pain Crisis .  Refer to the original H&P for additional details.  The current plan of care is to follow up after pain mgmt.  12:13 AM  Patient feeling much better. Stable for discharge. Has a ride home at bedside.     Darra Fonda MATSU, MD 12/17/23 [phone]

## 2023-12-17 NOTE — Discharge Instructions (Signed)
 Continue your home medication and follow with your hematology team. Return with any new or worsening symptoms.

## 2023-12-27 DIAGNOSIS — Z5181 Encounter for therapeutic drug level monitoring: Secondary | ICD-10-CM | POA: Diagnosis not present

## 2023-12-27 DIAGNOSIS — Z79891 Long term (current) use of opiate analgesic: Secondary | ICD-10-CM | POA: Diagnosis not present

## 2023-12-27 DIAGNOSIS — Z79899 Other long term (current) drug therapy: Secondary | ICD-10-CM | POA: Diagnosis not present

## 2023-12-27 DIAGNOSIS — R809 Proteinuria, unspecified: Secondary | ICD-10-CM | POA: Diagnosis not present

## 2023-12-27 DIAGNOSIS — R112 Nausea with vomiting, unspecified: Secondary | ICD-10-CM | POA: Diagnosis not present

## 2023-12-27 DIAGNOSIS — Z1331 Encounter for screening for depression: Secondary | ICD-10-CM | POA: Diagnosis not present

## 2023-12-27 DIAGNOSIS — D571 Sickle-cell disease without crisis: Secondary | ICD-10-CM | POA: Diagnosis not present

## 2023-12-27 DIAGNOSIS — R21 Rash and other nonspecific skin eruption: Secondary | ICD-10-CM | POA: Diagnosis not present

## 2023-12-29 ENCOUNTER — Emergency Department (HOSPITAL_BASED_OUTPATIENT_CLINIC_OR_DEPARTMENT_OTHER)
Admission: EM | Admit: 2023-12-29 | Discharge: 2023-12-29 | Disposition: A | Discharge status: Home / Self Care | Attending: Emergency Medicine | Admitting: Emergency Medicine

## 2023-12-29 ENCOUNTER — Other Ambulatory Visit: Payer: Self-pay

## 2023-12-29 ENCOUNTER — Encounter (HOSPITAL_BASED_OUTPATIENT_CLINIC_OR_DEPARTMENT_OTHER): Payer: Self-pay

## 2023-12-29 DIAGNOSIS — R112 Nausea with vomiting, unspecified: Secondary | ICD-10-CM | POA: Diagnosis not present

## 2023-12-29 DIAGNOSIS — D5700 Hb-SS disease with crisis, unspecified: Secondary | ICD-10-CM | POA: Insufficient documentation

## 2023-12-29 LAB — URINALYSIS, ROUTINE W REFLEX MICROSCOPIC
Glucose, UA: NEGATIVE mg/dL
Ketones, ur: NEGATIVE mg/dL
Leukocytes,Ua: NEGATIVE
Nitrite: NEGATIVE
Protein, ur: >=300 mg/dL — AB
Specific Gravity, Urine: 1.015 (ref 1.005–1.030)
pH: 5.5 (ref 5.0–8.0)

## 2023-12-29 LAB — COMPREHENSIVE METABOLIC PANEL WITH GFR
ALT: 31 U/L (ref 0–44)
AST: 58 U/L — ABNORMAL HIGH (ref 15–41)
Albumin: 4.5 g/dL (ref 3.5–5.0)
Alkaline Phosphatase: 111 U/L (ref 38–126)
Anion gap: 11 (ref 5–15)
BUN: 7 mg/dL (ref 6–20)
CO2: 22 mmol/L (ref 22–32)
Calcium: 9.7 mg/dL (ref 8.9–10.3)
Chloride: 107 mmol/L (ref 98–111)
Creatinine, Ser: 0.84 mg/dL (ref 0.61–1.24)
GFR, Estimated: >60 mL/min (ref >60)
Glucose, Bld: 107 mg/dL — ABNORMAL HIGH (ref 70–99)
Potassium: 4 mmol/L (ref 3.5–5.1)
Sodium: 140 mmol/L (ref 135–145)
Total Bilirubin: 2 mg/dL — ABNORMAL HIGH (ref 0.0–1.2)
Total Protein: 8.7 g/dL — ABNORMAL HIGH (ref 6.5–8.1)

## 2023-12-29 LAB — CBC WITH DIFFERENTIAL/PLATELET
Abs Immature Granulocytes: 0.02 K/uL (ref 0.00–0.07)
Basophils Absolute: 0 K/uL (ref 0.0–0.1)
Basophils Relative: 0 %
Eosinophils Absolute: 0.1 K/uL (ref 0.0–0.5)
Eosinophils Relative: 1 %
HCT: 24.1 % — ABNORMAL LOW (ref 39.0–52.0)
Hemoglobin: 8.9 g/dL — ABNORMAL LOW (ref 13.0–17.0)
Immature Granulocytes: 0 %
Lymphocytes Relative: 15 %
Lymphs Abs: 1.5 K/uL (ref 0.7–4.0)
MCH: 36.5 pg — ABNORMAL HIGH (ref 26.0–34.0)
MCHC: 36.9 g/dL — ABNORMAL HIGH (ref 30.0–36.0)
MCV: 98.8 fL (ref 80.0–100.0)
Monocytes Absolute: 0.8 K/uL (ref 0.1–1.0)
Monocytes Relative: 8 %
Neutro Abs: 7.5 K/uL (ref 1.7–7.7)
Neutrophils Relative %: 76 %
Platelets: 519 K/uL — ABNORMAL HIGH (ref 150–400)
RBC: 2.44 MIL/uL — ABNORMAL LOW (ref 4.22–5.81)
RDW: 17.9 % — ABNORMAL HIGH (ref 11.5–15.5)
Smear Review: NORMAL
WBC: 9.9 K/uL (ref 4.0–10.5)
nRBC: 0.8 % — ABNORMAL HIGH (ref 0.0–0.2)

## 2023-12-29 LAB — URINALYSIS, MICROSCOPIC (REFLEX): WBC, UA: NONE SEEN WBC/hpf (ref 0–5)

## 2023-12-29 LAB — URINE DRUG SCREEN
Amphetamines: NOT DETECTED
Barbiturates: NOT DETECTED
Benzodiazepines: NOT DETECTED
Cocaine: NOT DETECTED
Fentanyl: NOT DETECTED
Methadone Scn, Ur: NOT DETECTED
Opiates: NOT DETECTED
Tetrahydrocannabinol: NOT DETECTED

## 2023-12-29 LAB — LIPASE, BLOOD: Lipase: 23 U/L (ref 11–51)

## 2023-12-29 LAB — RETICULOCYTES
Immature Retic Fract: 53.4 % — ABNORMAL HIGH (ref 2.3–15.9)
RBC.: 2.49 MIL/uL — ABNORMAL LOW (ref 4.22–5.81)
Retic Count, Absolute: 44.1 K/uL (ref 19.0–186.0)
Retic Ct Pct: 1.8 % (ref 0.4–3.1)

## 2023-12-29 MED ORDER — PROCHLORPERAZINE EDISYLATE 10 MG/2ML IJ SOLN: 10.0000 mg | Freq: Once | INTRAMUSCULAR | Status: DC

## 2023-12-29 MED ORDER — KETOROLAC TROMETHAMINE 15 MG/ML IJ SOLN
15.0000 mg | INTRAMUSCULAR | Status: AC
  Administered 2023-12-29: 15 mg via INTRAVENOUS
  Filled 2023-12-29: qty 1

## 2023-12-29 MED ORDER — LACTATED RINGERS IV BOLUS
1000.0000 mL | Freq: Once | INTRAVENOUS | Status: AC
  Administered 2023-12-29: 1000 mL via INTRAVENOUS

## 2023-12-29 MED ORDER — HYDROMORPHONE HCL 1 MG/ML IJ SOLN
2.0000 mg | INTRAMUSCULAR | Status: AC
  Administered 2023-12-29: 2 mg via INTRAVENOUS
  Filled 2023-12-29: qty 2

## 2023-12-29 MED ORDER — ONDANSETRON HCL 4 MG/2ML IJ SOLN
4.0000 mg | Freq: Once | INTRAMUSCULAR | Status: AC
  Administered 2023-12-29: 4 mg via INTRAVENOUS
  Filled 2023-12-29: qty 2

## 2023-12-29 MED ORDER — HYDROMORPHONE HCL 1 MG/ML IJ SOLN
2.0000 mg | Freq: Once | INTRAMUSCULAR | Status: AC
  Administered 2023-12-29: 2 mg via INTRAVENOUS
  Filled 2023-12-29: qty 2

## 2023-12-29 NOTE — ED Provider Notes (Signed)
 [place] [facility] Provider Note   CSN: [id number] Arrival date & time: 12/29/23  1352     Patient presents with: Sickle Cell Pain Crisis   Manuel Mcguire is a 33 y.o. male with history of sickle cell anemia, recently underwent cholecystectomy on 11/22/2023, presents with concern for diarrhea that has been ongoing for the past 3 days.  States diarrhea is nonbloody.  He has been having about 5 episodes per day.  He also reports this is accompanied with nausea and non-bloody vomiting.  Reporting generalized abdominal pain.  Reports he has been having difficulties with the symptoms since having his gallbladder removed.  He denies any fever or chills.  Denies any recent antibiotic use, any abnormal food intake, or any recent travel. Denies marijuana use.  He also reports concern that this may be a sickle cell pain flare as he is having lower back pain consistent with his normal sickle cell pain..  Denies any chest pain or shortness of breath.    Sickle Cell Pain Crisis Associated symptoms: no fever        Prior to Admission medications   Medication Sig Start Date End Date Taking? Authorizing Provider  cholecalciferol  (VITAMIN D3) 25 MCG (1000 UNIT) tablet Take 1,000 Units by mouth daily.    [provider]  folic acid  (FOLVITE ) 1 MG tablet Take 1 tablet (1 mg total) by mouth daily. 10/27/12   Alvia Rosaline LABOR, MD  hydroxyurea  (HYDREA ) 500 MG capsule Take 3 capsules (1,500 mg total) by mouth daily. May take with food to minimize GI side effects. 10/27/12   Alvia Rosaline LABOR, MD  lisinopril  (ZESTRIL ) 2.5 MG tablet Take 2.5 mg by mouth daily.    [provider]  metoCLOPramide  (REGLAN ) 10 MG tablet Take 1 tablet (10 mg total) by mouth 3 (three) times daily with meals. Patient taking differently: Take 10 mg by mouth 3 (three) times daily as needed for nausea or vomiting. 12/02/23 12/01/24  Cherylene Homer HERO, NP  ondansetron   (ZOFRAN -ODT) 4 MG disintegrating tablet 4mg  ODT q4 hours prn nausea/vomit 11/06/23   Patt Alm Macho, MD  oxyCODONE  (OXY IR/ROXICODONE ) 5 MG immediate release tablet Take 1 tablet (5 mg total) by mouth every 6 (six) hours as needed for severe pain (pain score 7-10). 12/02/23   Ijaola, Onyeje M, NP  pseudoephedrine (SUDAFED) 120 MG 12 hr tablet Take 120 mg by mouth every 12 (twelve) hours as needed for congestion.    [provider]    Allergies: Patient has no known allergies.    Review of Systems  Constitutional:  Negative for fever.  Gastrointestinal:  Positive for abdominal pain.    Updated Vital Signs BP 130/82 (BP Location: Left Arm)   Pulse 93   Temp 98 F (36.7 C) (Oral)   Resp 16   SpO2 98%   Physical Exam Vitals and nursing note reviewed.  Constitutional:      General: He is not in acute distress.    Appearance: He is well-developed.     Comments: Vomiting upon initial evaluation  HENT:     Head: Normocephalic and atraumatic.  Eyes:     Conjunctiva/sclera: Conjunctivae normal.  Cardiovascular:     Rate and Rhythm: Normal rate and regular rhythm.     Heart sounds: No murmur heard. Pulmonary:     Effort: Pulmonary effort is normal. No respiratory distress.     Breath sounds: Normal breath sounds.  Abdominal:     Comments:  Abdomen is soft and nontender  Musculoskeletal:        General: No swelling.     Cervical back: Neck supple.  Skin:    General: Skin is warm and dry.     Capillary Refill: Capillary refill takes less than 2 seconds.  Neurological:     Mental Status: He is alert.  Psychiatric:        Mood and Affect: Mood normal.     (all labs ordered are listed, but only abnormal results are displayed) Labs Reviewed  CBC WITH DIFFERENTIAL/PLATELET - Abnormal; Notable for the following components:      Result Value   RBC 2.44 (*)    Hemoglobin 8.9 (*)    HCT 24.1 (*)    MCH 36.5 (*)    MCHC 36.9 (*)    RDW 17.9 (*)    Platelets 519 (*)     nRBC 0.8 (*)    All other components within normal limits  COMPREHENSIVE METABOLIC PANEL WITH GFR - Abnormal; Notable for the following components:   Glucose, Bld 107 (*)    Total Protein 8.7 (*)    AST 58 (*)    Total Bilirubin 2.0 (*)    All other components within normal limits  RETICULOCYTES - Abnormal; Notable for the following components:   RBC. 2.49 (*)    Immature Retic Fract 53.4 (*)    All other components within normal limits  LIPASE, BLOOD  URINALYSIS, ROUTINE W REFLEX MICROSCOPIC  URINE DRUG SCREEN    EKG: None  Radiology: No results found.   Procedures   Medications Ordered in the ED  ondansetron  (ZOFRAN ) injection 4 mg (4 mg Intravenous Given 12/29/23 1511)  HYDROmorphone  (DILAUDID ) injection 2 mg (2 mg Intravenous Given 12/29/23 1541)  lactated ringers  bolus 1,000 mL (0 mLs Intravenous Stopped 12/29/23 1738)  ketorolac  (TORADOL ) 15 MG/ML injection 15 mg (15 mg Intravenous Given 12/29/23 1511)  HYDROmorphone  (DILAUDID ) injection 2 mg (2 mg Intravenous Given 12/29/23 1635)  HYDROmorphone  (DILAUDID ) injection 2 mg (2 mg Intravenous Given 12/29/23 1737)    Clinical Course as of 12/29/23 1751  Thu Dec 29, 2023  1616 Patient feeling better with zofran  and pain medications.  [AF]  1707 Patient reevaluated, feeling better.  Tolerating p.o. intake of ginger ale at bedside.  Does request last dose of Dilaudid  before he goes. [AF]  1750 Rechecked patient after third dose of Dilaudid .  He reports he is feeling much better.  Still drinking Gatorade and ginger ale at bedside without difficulty.  He feels like he will be able to manage symptoms at home. [AF]    Clinical Course User Index [AF] Veta Palma, PA-C                                 Medical Decision Making Amount and/or Complexity of Data Reviewed Labs: ordered.  Risk Prescription drug management.     Differential diagnosis includes but is not limited to sickle cell pain flare, choledocholithiasis,  peptic ulcer, gastritis, gastroenteritis, appendicitis, IBS, IBD, DKA, nephrolithiasis, UTI, pyelonephritis, pancreatitis, diverticulitis, mesenteric ischemia, abdominal aortic aneurysm, small bowel obstruction, volvulus, testicular torsion in males   ED Course:  Upon initial evaluation, patient vomiting.  Reporting pain in the abdomen and lower back.  Upon my exam, abdomen is soft and nontender to palpation.  Labs Ordered: I Ordered, and personally interpreted labs.  The pertinent results include:   CBC without leukocytosis.  Hemoglobin  low at 8.9, but consistent with patient's baseline.  Platelets elevated at 519 CMP with elevated AST of 58, elevated T. bili at 2.0.  No elevation in ALT or alk phos.  Normal creatinine.  Normal electrolytes Lipase within normal limits   Medications Given: Zofran  2 mg Dilaudid  x 3 Toradol  LR bolus  Upon re-evaluation, patient appears much better.  He is no longer having any vomiting.  Has been able to tolerate p.o. intake of ginger ale and crackers at bedside.  Reports pain has improved with medications given here today.  Given symptoms today consistent with previous nausea and vomiting flares over the past couple of weeks, abdomen soft and non-tender, labs without any leukocytosis, or abnormality in LFTs or lipase, lower concern for acute intra-abdominal pathology. CT abdomen pelvis obtained on 7/11 when patient had same type of symptoms did not show any acute abnormality to explain his symptoms. He does not report any abnormal food intake or viral symptoms, lower concern for gastroenteritis.  Denies any use of marijuana, lower concern for cannabinoid hyperemesis syndrome.  Diarrhea is nonbloody, no fevers or tachycardia, no leukocytosis, no recent travel, no recent antibiotic use, low concern for infectious diarrhea at this time/ low concern for C. difficile.  Patient is unable to provide stool sample today for testing. Unclear etiology to his nausea and  vomiting episodes, but is improved here with medications and has had extensive workup recently with CT abdomen and pelvis and MRCPs previously for this concern without acute abnormality or retained biliary calculus noted. Do not feel he needs any further workup or admission at this time. Patient's lower back pain which he attributes to sickle cell pain has resolved with medications given here today.  Labs overall unremarkable aside from elevated platelets.  Will have him follow this up with his sickle cell clinic.    Impression: Nausea and vomiting Sickle cell pain  Disposition:  The patient was discharged home with instructions to take home pain and nausea medications as prescribed by his PCP.  Follow-up with the sickle cell clinic within the next month for recheck of his platelets and further management. Return precautions given.    Record Review: External records from outside source obtained and reviewed including  MRCP from 12/11/2023 without any calculus in the common bile duct ER visit from 12/16/23 for same symptoms of nausea and vomiting and sickle cell pain. CT scan from that visit unremarkable for any cause to his N/V     This chart was dictated using voice recognition software, Dragon. Despite the best efforts of this provider to proofread and correct errors, errors may still occur which can change documentation meaning.       Final diagnoses:  Sickle-cell disease with pain (HCC)  Nausea and vomiting, unspecified vomiting type    ED Discharge Orders     None          Veta Palma, PA-C 12/29/23 1751    Pamella Ozell LABOR, DO 01/01/24 1826

## 2023-12-29 NOTE — Discharge Instructions (Addendum)
 You were given fluids and medications today to help with your nausea and vomiting as well as your pain.   It is unclear what is causing your episodes of nausea or vomiting.  You may continue to take your home nausea medications as prescribed.  Your labs are reassuring today.  Your kidney, liver, and pancreas labs were at your baseline.  Your electrolytes were normal today.  Your blood counts were at your baseline aside from an elevated platelet value at 519.  Please have this repeated and monitored by your sickle cell clinic within the next month.  Please return to the ER if you develop worsening abdominal pain, persistent vomiting, fevers, any other new or concerning symptoms

## 2023-12-29 NOTE — ED Triage Notes (Signed)
 Pt states he is having nausea & vomiting with a sickle cell crisis x 2 day. Reports abdominal & back pain

## 2023-12-29 NOTE — ED Notes (Signed)
 Pt given water and ginger ale for PO challenge.

## 2024-01-08 ENCOUNTER — Emergency Department (HOSPITAL_BASED_OUTPATIENT_CLINIC_OR_DEPARTMENT_OTHER)
Admission: EM | Admit: 2024-01-08 | Discharge: 2024-01-08 | Disposition: A | Discharge status: Home / Self Care | Attending: Emergency Medicine | Admitting: Emergency Medicine

## 2024-01-08 ENCOUNTER — Other Ambulatory Visit: Payer: Self-pay

## 2024-01-08 ENCOUNTER — Encounter (HOSPITAL_BASED_OUTPATIENT_CLINIC_OR_DEPARTMENT_OTHER): Payer: Self-pay | Admitting: Emergency Medicine

## 2024-01-08 DIAGNOSIS — D72829 Elevated white blood cell count, unspecified: Secondary | ICD-10-CM | POA: Insufficient documentation

## 2024-01-08 DIAGNOSIS — D5700 Hb-SS disease with crisis, unspecified: Secondary | ICD-10-CM | POA: Insufficient documentation

## 2024-01-08 DIAGNOSIS — R0789 Other chest pain: Secondary | ICD-10-CM | POA: Diagnosis not present

## 2024-01-08 LAB — CBC WITH DIFFERENTIAL/PLATELET
Abs Immature Granulocytes: 0.04 K/uL (ref 0.00–0.07)
Basophils Absolute: 0.1 K/uL (ref 0.0–0.1)
Basophils Relative: 1 %
Eosinophils Absolute: 0.1 K/uL (ref 0.0–0.5)
Eosinophils Relative: 1 %
HCT: 26 % — ABNORMAL LOW (ref 39.0–52.0)
Hemoglobin: 9.5 g/dL — ABNORMAL LOW (ref 13.0–17.0)
Immature Granulocytes: 0 %
Lymphocytes Relative: 44 %
Lymphs Abs: 4.8 K/uL — ABNORMAL HIGH (ref 0.7–4.0)
MCH: 36.1 pg — ABNORMAL HIGH (ref 26.0–34.0)
MCHC: 36.5 g/dL — ABNORMAL HIGH (ref 30.0–36.0)
MCV: 98.9 fL (ref 80.0–100.0)
Monocytes Absolute: 1.4 K/uL — ABNORMAL HIGH (ref 0.1–1.0)
Monocytes Relative: 13 %
Neutro Abs: 4.4 K/uL (ref 1.7–7.7)
Neutrophils Relative %: 41 %
Platelets: 274 K/uL (ref 150–400)
RBC: 2.63 MIL/uL — ABNORMAL LOW (ref 4.22–5.81)
RDW: 18.8 % — ABNORMAL HIGH (ref 11.5–15.5)
WBC: 10.8 K/uL — ABNORMAL HIGH (ref 4.0–10.5)
nRBC: 2 % — ABNORMAL HIGH (ref 0.0–0.2)

## 2024-01-08 LAB — COMPREHENSIVE METABOLIC PANEL WITH GFR
ALT: 22 U/L (ref 0–44)
AST: 38 U/L (ref 15–41)
Albumin: 5 g/dL (ref 3.5–5.0)
Alkaline Phosphatase: 120 U/L (ref 38–126)
Anion gap: 14 (ref 5–15)
BUN: 8 mg/dL (ref 6–20)
CO2: 22 mmol/L (ref 22–32)
Calcium: 9.9 mg/dL (ref 8.9–10.3)
Chloride: 106 mmol/L (ref 98–111)
Creatinine, Ser: 0.85 mg/dL (ref 0.61–1.24)
GFR, Estimated: >60 mL/min (ref >60)
Glucose, Bld: 100 mg/dL — ABNORMAL HIGH (ref 70–99)
Potassium: 3.8 mmol/L (ref 3.5–5.1)
Sodium: 143 mmol/L (ref 135–145)
Total Bilirubin: 2.3 mg/dL — ABNORMAL HIGH (ref 0.0–1.2)
Total Protein: 9.1 g/dL — ABNORMAL HIGH (ref 6.5–8.1)

## 2024-01-08 LAB — RETICULOCYTES
Immature Retic Fract: 38.7 % — ABNORMAL HIGH (ref 2.3–15.9)
RBC.: 2.61 MIL/uL — ABNORMAL LOW (ref 4.22–5.81)
Retic Count, Absolute: 82.7 K/uL (ref 19.0–186.0)
Retic Ct Pct: 3.2 % — ABNORMAL HIGH (ref 0.4–3.1)

## 2024-01-08 LAB — LIPASE, BLOOD: Lipase: 36 U/L (ref 11–51)

## 2024-01-08 MED ORDER — HYDROMORPHONE HCL 1 MG/ML IJ SOLN: 1.0000 mg | Freq: Once | INTRAMUSCULAR | Status: DC

## 2024-01-08 MED ORDER — HYDROMORPHONE HCL 1 MG/ML IJ SOLN
2.0000 mg | INTRAMUSCULAR | Status: AC
  Administered 2024-01-08: 2 mg via INTRAVENOUS
  Filled 2024-01-08: qty 2

## 2024-01-08 MED ORDER — DIPHENHYDRAMINE HCL 25 MG PO CAPS: 25.0000 mg | ORAL_CAPSULE | ORAL | Status: DC | PRN

## 2024-01-08 MED ORDER — ONDANSETRON HCL 4 MG/2ML IJ SOLN
4.0000 mg | INTRAMUSCULAR | Status: DC | PRN
  Administered 2024-01-08: 4 mg via INTRAVENOUS
  Filled 2024-01-08: qty 2

## 2024-01-08 MED ORDER — SODIUM CHLORIDE 0.45 % IV SOLN: INTRAVENOUS | Status: DC

## 2024-01-08 NOTE — ED Triage Notes (Signed)
 Pt reports sickle cell pain in lower back and vomiting today

## 2024-01-08 NOTE — Discharge Instructions (Addendum)
 You were evaluated in the emergency room for sickle cell pain crisis.  Your lab work did not show any significant abnormality.  Please follow-up with your sickle crisis

## 2024-01-08 NOTE — ED Provider Notes (Signed)
 [place] [facility] Provider Note   CSN: [id number] Arrival date & time: 01/08/24  8079     Patient presents with: Sickle Cell Pain Crisis   Manuel Mcguire is a 33 y.o. male with history of sickle cell presents with concern for sickle cell pain.  Pain is localized to his lower back and abdomen.  Reports that this is his typical locations for his sickle cell pain.  Reports emesis x 3.  No diarrhea.  No urinary symptoms.  No URI symptoms.  Has a history of cholecystectomy.  States that this feels very similar to prior episodes of sickle cell pain.  Denies any urinary fecal incontinence.  He still able to ambulate denies any numbness or tingling of the lower extremities.  There was no injury or trauma.  History of IV drug use or cancer.    Sickle Cell Pain Crisis     Past Medical History:  Diagnosis Date   Scoliosis    Sickle cell anemia with pain (HCC)    Past Surgical History:  Procedure Laterality Date   BACK SURGERY     unaware of particular surgery   CHOLECYSTECTOMY N/A 11/22/2023   Procedure: LAPAROSCOPIC CHOLECYSTECTOMY WITH INTRAOPERATIVE CHOLANGIOGRAM;  Surgeon: Signe Mitzie LABOR, MD;  Location: [facility];  Service: General;  Laterality: N/A;  Poss IOC, ICG   HIP PINNING Left    Pt unaware of particulars     Prior to Admission medications   Medication Sig Start Date End Date Taking? Authorizing Provider  cholecalciferol  (VITAMIN D3) 25 MCG (1000 UNIT) tablet Take 1,000 Units by mouth daily.    [provider]  folic acid  (FOLVITE ) 1 MG tablet Take 1 tablet (1 mg total) by mouth daily. 10/27/12   Alvia Rosaline LABOR, MD  hydroxyurea  (HYDREA ) 500 MG capsule Take 3 capsules (1,500 mg total) by mouth daily. May take with food to minimize GI side effects. 10/27/12   Alvia Rosaline LABOR, MD  lisinopril  (ZESTRIL ) 2.5 MG tablet Take 2.5 mg by mouth daily.    [provider]  metoCLOPramide  (REGLAN ) 10 MG tablet Take 1 tablet  (10 mg total) by mouth 3 (three) times daily with meals. Patient taking differently: Take 10 mg by mouth 3 (three) times daily as needed for nausea or vomiting. 12/02/23 12/01/24  Cherylene Homer HERO, NP  ondansetron  (ZOFRAN -ODT) 4 MG disintegrating tablet 4mg  ODT q4 hours prn nausea/vomit 11/06/23   Patt Alm Macho, MD  oxyCODONE  (OXY IR/ROXICODONE ) 5 MG immediate release tablet Take 1 tablet (5 mg total) by mouth every 6 (six) hours as needed for severe pain (pain score 7-10). 12/02/23   Ijaola, Onyeje M, NP  pseudoephedrine (SUDAFED) 120 MG 12 hr tablet Take 120 mg by mouth every 12 (twelve) hours as needed for congestion.    [provider]    Allergies: Patient has no known allergies.    Review of Systems  Musculoskeletal:  Positive for myalgias.    Updated Vital Signs BP 130/85   Pulse 73   Temp 99.1 F (37.3 C)   Resp 18   Ht 5' 7 (1.702 m)   Wt 74.9 kg   SpO2 97%   BMI 25.86 kg/m   Physical Exam Vitals and nursing note reviewed.  Constitutional:      General: He is not in acute distress.    Appearance: He is well-developed.  HENT:     Head: Normocephalic and atraumatic.  Eyes:     Conjunctiva/sclera: Conjunctivae normal.  Cardiovascular:     Rate and Rhythm: Normal rate and regular rhythm.     Heart sounds: No murmur heard. Pulmonary:     Effort: Pulmonary effort is normal. No respiratory distress.     Breath sounds: Normal breath sounds.  Abdominal:     Palpations: Abdomen is soft.     Tenderness: There is abdominal tenderness.     Comments: Mild generalized tenderness, soft nondistended  Musculoskeletal:        General: No swelling.     Cervical back: Neck supple.     Comments: Mild bilateral lumbar paraspinal tenderness without midline tenderness, 5 out of 5 lower extremity strength, no sensation deficit, is able to ambulate  Skin:    General: Skin is warm and dry.     Capillary Refill: Capillary refill takes less than 2 seconds.  Neurological:      Mental Status: He is alert.  Psychiatric:        Mood and Affect: Mood normal.     (all labs ordered are listed, but only abnormal results are displayed) Labs Reviewed  COMPREHENSIVE METABOLIC PANEL WITH GFR - Abnormal; Notable for the following components:      Result Value   Glucose, Bld 100 (*)    Total Protein 9.1 (*)    Total Bilirubin 2.3 (*)    All other components within normal limits  CBC WITH DIFFERENTIAL/PLATELET - Abnormal; Notable for the following components:   WBC 10.8 (*)    RBC 2.63 (*)    Hemoglobin 9.5 (*)    HCT 26.0 (*)    MCH 36.1 (*)    MCHC 36.5 (*)    RDW 18.8 (*)    nRBC 2.0 (*)    Lymphs Abs 4.8 (*)    Monocytes Absolute 1.4 (*)    All other components within normal limits  RETICULOCYTES - Abnormal; Notable for the following components:   Retic Ct Pct 3.2 (*)    RBC. 2.61 (*)    Immature Retic Fract 38.7 (*)    All other components within normal limits  LIPASE, BLOOD  URINALYSIS, ROUTINE W REFLEX MICROSCOPIC    EKG: EKG Interpretation Date/Time:  Sunday January 08 2024 20:49:38 EDT Ventricular Rate:  72 PR Interval:  166 QRS Duration:  84 QT Interval:  408 QTC Calculation: 447 R Axis:   16  Text Interpretation: Sinus rhythm Minimal ST elevation, anterior leads Similar borderline ST elevation in aterior leads to prior ECG Confirmed by Dreama Longs ([id number]) on 01/08/2024 8:55:04 PM  Radiology: No results found.   Procedures   Medications Ordered in the ED  HYDROmorphone  (DILAUDID ) injection 1 mg (1 mg Subcutaneous Not Given 01/08/24 2003)  ondansetron  (ZOFRAN ) injection 4 mg (4 mg Intravenous Given 01/08/24 2006)  diphenhydrAMINE  (BENADRYL ) capsule 25-50 mg (has no administration in time range)  0.45 % sodium chloride  infusion ( Intravenous New Bag/Given 01/08/24 2012)  HYDROmorphone  (DILAUDID ) injection 2 mg (2 mg Intravenous Given 01/08/24 2007)  HYDROmorphone  (DILAUDID ) injection 2 mg (2 mg Intravenous Given 01/08/24 2039)  HYDROmorphone   (DILAUDID ) injection 2 mg (2 mg Intravenous Given 01/08/24 2121)                                    Medical Decision Making Amount and/or Complexity of Data Reviewed Labs: ordered.  Risk Prescription drug management.   This patient presents to the ED with chief complaint(s) of back and abdominal pain.  The complaint involves an extensive differential diagnosis and also carries with it a high risk of complications and morbidity.   Pertinent past medical history as listed in HPI  The differential diagnosis includes  Sickle cell pain, appendicitis, gastroenteritis, URI, musculoskeletal, pyelonephritis, nephrolithiasis, cauda equina, spinal abscess Additional history obtained: Records reviewed Care Everywhere/External Records  Assessment and management:   Hemodynamically stable, nontoxic-appearing patient presented with complaints of low back pain and abdominal pain that has been ongoing for the past 2 days.  States the pain feels very similar to his prior episodes of sickle cell pain.  Denies any new symptoms.  Reports emesis x 1.  No diarrhea.  No URI symptoms.  His lungs are clear.  His abdomen is mildly generally tender, soft nondistended.  He does have bilateral lower lumbar paraspinal tenderness, negative CVAT.  He has no urinary symptoms.  Do not suspect pyelonephritis, nephrolithiasis.  He has a history of cholecystectomy.  He has no tenderness to McBurney's point.  Do not suspect appendicitis.  He has no neurodeficits on exam.  He is able to ambulate.  No red flag symptoms.  Without any urinary or fecal incontinence.  No history of IV drug use or cancer.  Chart review demonstrates numerous visits with similar presentation.  Initial exam is most consistent with recurrent sickle cell pain crisis.  Will obtain routine labs and treat his pain.  Workup overall reassuring.  Patient received his typical 3 doses of Dilaudid , Zofran  and IV fluids.  Reports marked improvement of symptoms.  He is  tolerating p.o.  He is sitting comfortably squatting on his phone.  Feels comfortable going home.  Will discharge home and have him follow-up with his sickle cell clinic.  Patient is agreeable to plan.  Independent ECG interpretation:  Sinus rhythm  Independent labs interpretation:  The following labs were independently interpreted:  CBC with mild leukocytosis of 10.8, hemoglobin stable, CMP with mildly elevated bili of 2.3, elevated reticulocyte count  Independent visualization and interpretation of imaging: I independently visualized the following imaging with scope of interpretation limited to determining acute life threatening conditions related to emergency care: None   Consultations obtained:   none  Disposition:   Patient will be discharged home. The patient has been appropriately medically screened and/or stabilized in the ED. I have low suspicion for any other emergent medical condition which would require further screening, evaluation or treatment in the ED or require inpatient management. At time of discharge the patient is hemodynamically stable and in no acute distress. I have discussed work-up results and diagnosis with patient and answered all questions. Patient is agreeable with discharge plan. We discussed strict return precautions for returning to the emergency department and they verbalized understanding.     Social Determinants of Health:   none  This note was dictated with voice recognition software.  Despite best efforts at proofreading, errors may have occurred which can change the documentation meaning.       Final diagnoses:  Sickle cell pain crisis [facility])    ED Discharge Orders     None          Donnajean Lynwood VEAR DEVONNA 01/08/24 2317    Dreama Longs, MD 01/09/24 1224

## 2024-01-13 ENCOUNTER — Encounter (HOSPITAL_BASED_OUTPATIENT_CLINIC_OR_DEPARTMENT_OTHER): Payer: Self-pay | Admitting: Emergency Medicine

## 2024-01-13 ENCOUNTER — Emergency Department (HOSPITAL_BASED_OUTPATIENT_CLINIC_OR_DEPARTMENT_OTHER)
Admission: EM | Admit: 2024-01-13 | Discharge: 2024-01-13 | Disposition: A | Discharge status: Home / Self Care | Attending: Emergency Medicine | Admitting: Emergency Medicine

## 2024-01-13 ENCOUNTER — Other Ambulatory Visit: Payer: Self-pay

## 2024-01-13 DIAGNOSIS — D5700 Hb-SS disease with crisis, unspecified: Secondary | ICD-10-CM | POA: Insufficient documentation

## 2024-01-13 DIAGNOSIS — R1084 Generalized abdominal pain: Secondary | ICD-10-CM | POA: Insufficient documentation

## 2024-01-13 LAB — COMPREHENSIVE METABOLIC PANEL WITH GFR
ALT: 12 U/L (ref 0–44)
AST: 31 U/L (ref 15–41)
Albumin: 4.1 g/dL (ref 3.5–5.0)
Alkaline Phosphatase: 97 U/L (ref 38–126)
Anion gap: 10 (ref 5–15)
BUN: 11 mg/dL (ref 6–20)
CO2: 21 mmol/L — ABNORMAL LOW (ref 22–32)
Calcium: 9.1 mg/dL (ref 8.9–10.3)
Chloride: 111 mmol/L (ref 98–111)
Creatinine, Ser: 0.89 mg/dL (ref 0.61–1.24)
GFR, Estimated: >60 mL/min (ref >60)
Glucose, Bld: 97 mg/dL (ref 70–99)
Potassium: 3.8 mmol/L (ref 3.5–5.1)
Sodium: 142 mmol/L (ref 135–145)
Total Bilirubin: 1.4 mg/dL — ABNORMAL HIGH (ref 0.0–1.2)
Total Protein: 7.8 g/dL (ref 6.5–8.1)

## 2024-01-13 LAB — RETICULOCYTES
Immature Retic Fract: 39.5 % — ABNORMAL HIGH (ref 2.3–15.9)
RBC.: 2.36 MIL/uL — ABNORMAL LOW (ref 4.22–5.81)
Retic Count, Absolute: 38.4 K/uL (ref 19.0–186.0)
Retic Ct Pct: 1.6 % (ref 0.4–3.1)

## 2024-01-13 LAB — CBC WITH DIFFERENTIAL/PLATELET
Abs Immature Granulocytes: 0.08 K/uL — ABNORMAL HIGH (ref 0.00–0.07)
Basophils Absolute: 0.1 K/uL (ref 0.0–0.1)
Basophils Relative: 0 %
Eosinophils Absolute: 0.3 K/uL (ref 0.0–0.5)
Eosinophils Relative: 2 %
HCT: 23.2 % — ABNORMAL LOW (ref 39.0–52.0)
Hemoglobin: 8.7 g/dL — ABNORMAL LOW (ref 13.0–17.0)
Immature Granulocytes: 1 %
Lymphocytes Relative: 21 %
Lymphs Abs: 2.9 K/uL (ref 0.7–4.0)
MCH: 36.9 pg — ABNORMAL HIGH (ref 26.0–34.0)
MCHC: 37.5 g/dL — ABNORMAL HIGH (ref 30.0–36.0)
MCV: 98.3 fL (ref 80.0–100.0)
Monocytes Absolute: 1.1 K/uL — ABNORMAL HIGH (ref 0.1–1.0)
Monocytes Relative: 8 %
Neutro Abs: 9.3 K/uL — ABNORMAL HIGH (ref 1.7–7.7)
Neutrophils Relative %: 68 %
Platelets: 208 K/uL (ref 150–400)
RBC: 2.36 MIL/uL — ABNORMAL LOW (ref 4.22–5.81)
RDW: 19.7 % — ABNORMAL HIGH (ref 11.5–15.5)
WBC: 13.7 K/uL — ABNORMAL HIGH (ref 4.0–10.5)
nRBC: 1.8 % — ABNORMAL HIGH (ref 0.0–0.2)

## 2024-01-13 MED ORDER — HYDROMORPHONE HCL 1 MG/ML IJ SOLN
2.0000 mg | INTRAMUSCULAR | Status: AC
  Filled 2024-01-13: qty 2

## 2024-01-13 MED ORDER — HYDROMORPHONE HCL 1 MG/ML IJ SOLN
2.0000 mg | INTRAMUSCULAR | Status: AC
  Administered 2024-01-13: 2 mg via INTRAVENOUS
  Filled 2024-01-13: qty 2

## 2024-01-13 MED ORDER — SODIUM CHLORIDE 0.45 % IV SOLN: INTRAVENOUS | Status: DC

## 2024-01-13 MED ORDER — HYDROMORPHONE HCL 1 MG/ML IJ SOLN
2.0000 mg | Freq: Once | INTRAMUSCULAR | Status: AC
  Administered 2024-01-13: 2 mg via INTRAVENOUS
  Filled 2024-01-13: qty 2

## 2024-01-13 MED ORDER — OXYCODONE HCL 5 MG PO TABS
5.0000 mg | ORAL_TABLET | Freq: Once | ORAL | Status: AC
  Administered 2024-01-13: 5 mg via ORAL
  Filled 2024-01-13: qty 1

## 2024-01-13 MED ORDER — HYDROMORPHONE HCL 1 MG/ML IJ SOLN
2.0000 mg | INTRAMUSCULAR | Status: AC
  Administered 2024-01-13: 2 mg via INTRAVENOUS

## 2024-01-13 MED ORDER — ONDANSETRON HCL 4 MG/2ML IJ SOLN
4.0000 mg | Freq: Once | INTRAMUSCULAR | Status: AC
  Administered 2024-01-13: 4 mg via INTRAVENOUS
  Filled 2024-01-13: qty 2

## 2024-01-13 MED ORDER — KETOROLAC TROMETHAMINE 15 MG/ML IJ SOLN
15.0000 mg | INTRAMUSCULAR | Status: AC
  Administered 2024-01-13: 15 mg via INTRAVENOUS
  Filled 2024-01-13: qty 1

## 2024-01-13 NOTE — Discharge Instructions (Addendum)
 1.  Continue your regular home medications. 2.  Follow-up with your doctor for recheck as soon as possible. 3.  Return if you have any new worsening or concerning symptoms.

## 2024-01-13 NOTE — ED Provider Notes (Signed)
 Patient is presenting with sickle cell pain.  Pain medications being administered and labs pending. Physical Exam  BP (!) 150/108 (BP Location: Right Arm)   Pulse 72   Temp 97.8 F (36.6 C)   Resp 20   Ht 5' 7 (1.702 m)   Wt 75 kg   SpO2 100%   BMI 25.90 kg/m   Physical Exam  Procedures  Procedures  ED Course / MDM   Clinical Course as of 01/13/24 0801  Fri Jan 13, 2024  0645 CBC with anemia, similar to previous.  [CS]  [phone] Care of the patient signed out at shift change pending labs and pain control.  [CS]    Clinical Course User Index [CS] Roselyn Carlin NOVAK, MD   Medical Decision Making Amount and/or Complexity of Data Reviewed Labs: ordered.  Risk Prescription drug management.   Patient recheck 08: 00 patient reports pain is better than it was upon arrival but is still uncomfortable.  Patient has had 1 to 2 mg dose of Dilaudid .  Patient reports pain is consistent with what he has suffered from sickle cell pain crisis.  He does not think this is any worsening of his post cholecystectomy.  Patient reports the pain is very similar to what he experienced before the cholecystectomy and there was really no particular change.  He has not had localizing pain or wound drainage or complications.  Will complete the Dilaudid  dosing as ordered for sickle cell pain crisis and reassess.  At this time based on evaluation of the patient, I do not think that repeat CT imaging is indicated.  Patient also does not feel that this is a significantly change from typical pain episodes and does not need additional imaging.  Recheck: Patient feels improved after completing pain medication course.  No distress.  Looks well.  Stable for discharge.      Armenta Canning, MD 01/13/24 [phone]

## 2024-01-13 NOTE — ED Triage Notes (Signed)
 Pt states abdominal pain and emesis X 1 day. Feels like Sickle cell pain.

## 2024-01-13 NOTE — ED Provider Notes (Signed)
 [place] [facility]  Provider Note  CSN: [id number] Arrival date & time: 01/13/24 9395  History Chief Complaint  Patient presents with  . Abdominal Pain    Manuel Mcguire is a 33 y.o. male with history of sickle cell disease and frequent ED visits and/or admissions for pain reports 1 days of abdominal pain and vomiting. Similar to previous episodes and worsened since he had his gall bladder removed in June. Denies fever, no hematemesis. Has had trouble keeping down his pain medications at home.    Home Medications Prior to Admission medications   Medication Sig Start Date End Date Taking? Authorizing Provider  cholecalciferol  (VITAMIN D3) 25 MCG (1000 UNIT) tablet Take 1,000 Units by mouth daily.    [provider]  folic acid  (FOLVITE ) 1 MG tablet Take 1 tablet (1 mg total) by mouth daily. 10/27/12   Alvia Rosaline LABOR, MD  hydroxyurea  (HYDREA ) 500 MG capsule Take 3 capsules (1,500 mg total) by mouth daily. May take with food to minimize GI side effects. 10/27/12   Alvia Rosaline LABOR, MD  lisinopril  (ZESTRIL ) 2.5 MG tablet Take 2.5 mg by mouth daily.    [provider]  metoCLOPramide  (REGLAN ) 10 MG tablet Take 1 tablet (10 mg total) by mouth 3 (three) times daily with meals. Patient taking differently: Take 10 mg by mouth 3 (three) times daily as needed for nausea or vomiting. 12/02/23 12/01/24  Cherylene Homer HERO, NP  ondansetron  (ZOFRAN -ODT) 4 MG disintegrating tablet 4mg  ODT q4 hours prn nausea/vomit 11/06/23   Patt Alm Macho, MD  oxyCODONE  (OXY IR/ROXICODONE ) 5 MG immediate release tablet Take 1 tablet (5 mg total) by mouth every 6 (six) hours as needed for severe pain (pain score 7-10). 12/02/23   Ijaola, Onyeje M, NP  pseudoephedrine (SUDAFED) 120 MG 12 hr tablet Take 120 mg by mouth every 12 (twelve) hours as needed for congestion.    [provider]     Allergies    Patient has no known allergies.   Review  of Systems   Review of Systems Please see HPI for pertinent positives and negatives  Physical Exam BP (!) 150/108 (BP Location: Right Arm)   Pulse 72   Temp 97.8 F (36.6 C)   Resp 20   Ht 5' 7 (1.702 m)   Wt 75 kg   SpO2 100%   BMI 25.90 kg/m   Physical Exam Vitals and nursing note reviewed.  Constitutional:      Appearance: Normal appearance.  HENT:     Head: Normocephalic and atraumatic.     Nose: Nose normal.     Mouth/Throat:     Mouth: Mucous membranes are moist.  Eyes:     Extraocular Movements: Extraocular movements intact.     Conjunctiva/sclera: Conjunctivae normal.  Cardiovascular:     Rate and Rhythm: Normal rate.  Pulmonary:     Effort: Pulmonary effort is normal.     Breath sounds: Normal breath sounds.  Abdominal:     General: Abdomen is flat.     Palpations: Abdomen is soft.     Tenderness: There is generalized abdominal tenderness.  Musculoskeletal:        General: No swelling. Normal range of motion.     Cervical back: Neck supple.  Skin:    General: Skin is warm and dry.  Neurological:     General: No focal deficit present.     Mental Status: He is alert.  Psychiatric:  Mood and Affect: Mood normal.     ED Results / Procedures / Treatments   EKG None  Procedures Procedures  Medications Ordered in the ED Medications  HYDROmorphone  (DILAUDID ) injection 2 mg (has no administration in time range)  HYDROmorphone  (DILAUDID ) injection 2 mg (has no administration in time range)  ondansetron  (ZOFRAN ) injection 4 mg (has no administration in time range)  0.45 % sodium chloride  infusion (has no administration in time range)  ketorolac  (TORADOL ) 15 MG/ML injection 15 mg (15 mg Intravenous Given 01/13/24 0633)  HYDROmorphone  (DILAUDID ) injection 2 mg (2 mg Intravenous Given 01/13/24 0634)    Initial Impression and Plan  Patient here with abdominal pain, vomiting similar to 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 Will check labs, give IVF  and pain/nausea meds for comfort.   ED Course   Clinical Course as of 01/13/24 0649  Fri Jan 13, 2024  0645 CBC with anemia, similar to previous.  [CS]  [phone] Care of the patient signed out at shift change pending labs and pain control.  [CS]    Clinical Course User Index [CS] Roselyn Carlin NOVAK, MD     MDM Rules/Calculators/A&P Medical Decision Making Problems Addressed: Generalized abdominal pain: chronic illness or injury with exacerbation, progression, or side effects of treatment Sickle-cell disease with pain [facility]): chronic illness or injury with exacerbation, progression, or side effects of treatment  Amount and/or Complexity of Data Reviewed Labs: ordered. Decision-making details documented in ED Course.  Risk Prescription drug management. Parenteral controlled substances.     Final Clinical Impression(s) / ED Diagnoses Final diagnoses:  Generalized abdominal pain  Sickle-cell disease with pain [facility])    Rx / DC Orders ED Discharge Orders     None        Roselyn Carlin NOVAK, MD 01/13/24 [phone]

## 2024-01-14 ENCOUNTER — Encounter (HOSPITAL_BASED_OUTPATIENT_CLINIC_OR_DEPARTMENT_OTHER): Payer: Self-pay | Admitting: Emergency Medicine

## 2024-01-14 ENCOUNTER — Emergency Department (HOSPITAL_BASED_OUTPATIENT_CLINIC_OR_DEPARTMENT_OTHER)
Admission: EM | Admit: 2024-01-14 | Discharge: 2024-01-15 | Disposition: A | Discharge status: Home / Self Care | Source: Home / Self Care | Attending: Emergency Medicine | Admitting: Emergency Medicine

## 2024-01-14 ENCOUNTER — Emergency Department (HOSPITAL_BASED_OUTPATIENT_CLINIC_OR_DEPARTMENT_OTHER)

## 2024-01-14 ENCOUNTER — Other Ambulatory Visit: Payer: Self-pay

## 2024-01-14 DIAGNOSIS — M545 Low back pain, unspecified: Secondary | ICD-10-CM | POA: Insufficient documentation

## 2024-01-14 DIAGNOSIS — R1011 Right upper quadrant pain: Secondary | ICD-10-CM | POA: Insufficient documentation

## 2024-01-14 DIAGNOSIS — I517 Cardiomegaly: Secondary | ICD-10-CM | POA: Diagnosis not present

## 2024-01-14 DIAGNOSIS — G8929 Other chronic pain: Secondary | ICD-10-CM

## 2024-01-14 DIAGNOSIS — D5700 Hb-SS disease with crisis, unspecified: Secondary | ICD-10-CM | POA: Insufficient documentation

## 2024-01-14 DIAGNOSIS — R109 Unspecified abdominal pain: Secondary | ICD-10-CM | POA: Diagnosis not present

## 2024-01-14 DIAGNOSIS — I10 Essential (primary) hypertension: Secondary | ICD-10-CM | POA: Diagnosis not present

## 2024-01-14 DIAGNOSIS — Z9049 Acquired absence of other specified parts of digestive tract: Secondary | ICD-10-CM | POA: Diagnosis not present

## 2024-01-14 DIAGNOSIS — R1013 Epigastric pain: Secondary | ICD-10-CM | POA: Insufficient documentation

## 2024-01-14 LAB — LIPASE, BLOOD: Lipase: 30 U/L (ref 11–51)

## 2024-01-14 LAB — CBC WITH DIFFERENTIAL/PLATELET
Abs Granulocyte: 7.1 K/uL — ABNORMAL HIGH (ref 1.5–6.5)
Abs Immature Granulocytes: 0.09 K/uL — ABNORMAL HIGH (ref 0.00–0.07)
Basophils Absolute: 0.1 K/uL (ref 0.0–0.1)
Basophils Relative: 1 %
Eosinophils Absolute: 0.1 K/uL (ref 0.0–0.5)
Eosinophils Relative: 1 %
HCT: 25 % — ABNORMAL LOW (ref 39.0–52.0)
Hemoglobin: 9.2 g/dL — ABNORMAL LOW (ref 13.0–17.0)
Immature Granulocytes: 1 %
Lymphocytes Relative: 24 %
Lymphs Abs: 2.6 K/uL (ref 0.7–4.0)
MCH: 36.8 pg — ABNORMAL HIGH (ref 26.0–34.0)
MCHC: 36.8 g/dL — ABNORMAL HIGH (ref 30.0–36.0)
MCV: 100 fL (ref 80.0–100.0)
Monocytes Absolute: 1.1 K/uL — ABNORMAL HIGH (ref 0.1–1.0)
Monocytes Relative: 10 %
Neutro Abs: 7.1 K/uL (ref 1.7–7.7)
Neutrophils Relative %: 63 %
Platelets: 227 K/uL (ref 150–400)
RBC: 2.5 MIL/uL — ABNORMAL LOW (ref 4.22–5.81)
RDW: 20.6 % — ABNORMAL HIGH (ref 11.5–15.5)
Smear Review: NORMAL
WBC: 11 K/uL — ABNORMAL HIGH (ref 4.0–10.5)
nRBC: 3.7 % — ABNORMAL HIGH (ref 0.0–0.2)

## 2024-01-14 LAB — URINALYSIS, ROUTINE W REFLEX MICROSCOPIC
Bilirubin Urine: NEGATIVE
Glucose, UA: NEGATIVE mg/dL
Ketones, ur: NEGATIVE mg/dL
Leukocytes,Ua: NEGATIVE
Nitrite: NEGATIVE
Protein, ur: 100 mg/dL — AB
Specific Gravity, Urine: 1.02 (ref 1.005–1.030)
pH: 5.5 (ref 5.0–8.0)

## 2024-01-14 LAB — URINALYSIS, MICROSCOPIC (REFLEX)

## 2024-01-14 LAB — COMPREHENSIVE METABOLIC PANEL WITH GFR
ALT: 12 U/L (ref 0–44)
AST: 35 U/L (ref 15–41)
Albumin: 4.4 g/dL (ref 3.5–5.0)
Alkaline Phosphatase: 101 U/L (ref 38–126)
Anion gap: 12 (ref 5–15)
BUN: <5 mg/dL — ABNORMAL LOW (ref 6–20)
CO2: 22 mmol/L (ref 22–32)
Calcium: 9.4 mg/dL (ref 8.9–10.3)
Chloride: 108 mmol/L (ref 98–111)
Creatinine, Ser: 0.78 mg/dL (ref 0.61–1.24)
GFR, Estimated: >60 mL/min (ref >60)
Glucose, Bld: 92 mg/dL (ref 70–99)
Potassium: 3.8 mmol/L (ref 3.5–5.1)
Sodium: 142 mmol/L (ref 135–145)
Total Bilirubin: 1.9 mg/dL — ABNORMAL HIGH (ref 0.0–1.2)
Total Protein: 8.3 g/dL — ABNORMAL HIGH (ref 6.5–8.1)

## 2024-01-14 LAB — RETICULOCYTES
Immature Retic Fract: 57.4 % — ABNORMAL HIGH (ref 2.3–15.9)
RBC.: 2.14 MIL/uL — ABNORMAL LOW (ref 4.22–5.81)
Retic Count, Absolute: 49.9 K/uL (ref 19.0–186.0)
Retic Ct Pct: 2.3 % (ref 0.4–3.1)

## 2024-01-14 MED ORDER — SODIUM CHLORIDE 0.9 % IV SOLN
12.5000 mg | Freq: Once | INTRAVENOUS | Status: AC
  Administered 2024-01-14: 12.5 mg via INTRAVENOUS
  Filled 2024-01-14: qty 0.25

## 2024-01-14 MED ORDER — ONDANSETRON HCL 4 MG/2ML IJ SOLN
4.0000 mg | INTRAMUSCULAR | Status: DC | PRN
  Administered 2024-01-14: 4 mg via INTRAVENOUS
  Filled 2024-01-14: qty 2

## 2024-01-14 MED ORDER — HYDROMORPHONE HCL 1 MG/ML IJ SOLN
2.0000 mg | INTRAMUSCULAR | Status: AC
  Administered 2024-01-14: 2 mg via INTRAVENOUS
  Filled 2024-01-14: qty 2

## 2024-01-14 MED ORDER — IOHEXOL 300 MG/ML  SOLN
100.0000 mL | Freq: Once | INTRAMUSCULAR | Status: AC | PRN
  Administered 2024-01-14: 100 mL via INTRAVENOUS

## 2024-01-14 MED ORDER — DEXTROSE-SODIUM CHLORIDE 5-0.45 % IV SOLN: INTRAVENOUS | Status: DC

## 2024-01-14 MED ORDER — DIPHENHYDRAMINE HCL 50 MG/ML IJ SOLN
INTRAMUSCULAR | Status: AC
  Administered 2024-01-14: 50 mg
  Filled 2024-01-14: qty 1

## 2024-01-14 NOTE — ED Triage Notes (Signed)
 Pt returns for Sickle Cell pain (lower back) and vomiting

## 2024-01-14 NOTE — ED Provider Notes (Addendum)
 [place] [facility] Provider Note   CSN: [id number] Arrival date & time: 01/14/24  2026     Patient presents with: Emesis and Sickle Cell Pain Crisis   Manuel Mcguire is a 33 y.o. male.   Patient here with persistent sickle cell pain right upper quadrant abdomen epigastric lower back associated with nausea vomiting.  Patient's temp here 99.7 pulse 87 blood pressure 134/86.  Patient seen frequently for sickle cell pain.  Often times is admitted.  Patient was just seen August 3 and again on August 8 yesterday.  Patient had his gallbladder removed in June.  States that the pain feels like gallbladder pain again.  But also sometimes a sickle cell pain is like that as well.  Patient states he really did not get much better after the emergency department visit on the eighth.  May require admission this time.  Past medical history significant for sickle cell anemia with pain scoliosis.  Patient has gallbladder removed on November 22, 2023.       Prior to Admission medications   Medication Sig Start Date End Date Taking? Authorizing Provider  cholecalciferol  (VITAMIN D3) 25 MCG (1000 UNIT) tablet Take 1,000 Units by mouth daily.    [provider]  folic acid  (FOLVITE ) 1 MG tablet Take 1 tablet (1 mg total) by mouth daily. 10/27/12   Alvia Rosaline LABOR, MD  hydroxyurea  (HYDREA ) 500 MG capsule Take 3 capsules (1,500 mg total) by mouth daily. May take with food to minimize GI side effects. 10/27/12   Alvia Rosaline LABOR, MD  lisinopril  (ZESTRIL ) 2.5 MG tablet Take 2.5 mg by mouth daily.    [provider]  metoCLOPramide  (REGLAN ) 10 MG tablet Take 1 tablet (10 mg total) by mouth 3 (three) times daily with meals. Patient taking differently: Take 10 mg by mouth 3 (three) times daily as needed for nausea or vomiting. 12/02/23 12/01/24  Cherylene Homer HERO, NP  ondansetron  (ZOFRAN -ODT) 4 MG disintegrating tablet 4mg  ODT q4 hours prn nausea/vomit  11/06/23   Patt Alm Macho, MD  oxyCODONE  (OXY IR/ROXICODONE ) 5 MG immediate release tablet Take 1 tablet (5 mg total) by mouth every 6 (six) hours as needed for severe pain (pain score 7-10). 12/02/23   Ijaola, Onyeje M, NP  pseudoephedrine (SUDAFED) 120 MG 12 hr tablet Take 120 mg by mouth every 12 (twelve) hours as needed for congestion.    [provider]    Allergies: Patient has no known allergies.    Review of Systems  Constitutional:  Negative for chills and fever.  HENT:  Negative for ear pain and sore throat.   Eyes:  Negative for pain and visual disturbance.  Respiratory:  Negative for cough and shortness of breath.   Cardiovascular:  Negative for chest pain and palpitations.  Gastrointestinal:  Positive for abdominal pain, nausea and vomiting.  Genitourinary:  Negative for dysuria and hematuria.  Musculoskeletal:  Positive for back pain. Negative for arthralgias.  Skin:  Negative for color change and rash.  Neurological:  Negative for seizures and syncope.  All other systems reviewed and are negative.   Updated Vital Signs BP 134/86 (BP Location: Right Arm)   Pulse 87   Temp 99.7 F (37.6 C)   Resp 16   Ht 1.702 m (5' 7)   Wt 75 kg   SpO2 97%   BMI 25.90 kg/m   Physical Exam Vitals and nursing note reviewed.  Constitutional:      General: He  is not in acute distress.    Appearance: Normal appearance. He is well-developed. He is not ill-appearing.  HENT:     Head: Normocephalic and atraumatic.  Eyes:     Extraocular Movements: Extraocular movements intact.     Conjunctiva/sclera: Conjunctivae normal.     Pupils: Pupils are equal, round, and reactive to light.  Cardiovascular:     Rate and Rhythm: Normal rate and regular rhythm.     Heart sounds: No murmur heard. Pulmonary:     Effort: Pulmonary effort is normal. No respiratory distress.     Breath sounds: Normal breath sounds.  Abdominal:     General: There is no distension.     Palpations:  Abdomen is soft.     Tenderness: There is no abdominal tenderness.  Musculoskeletal:        General: No swelling.     Cervical back: Neck supple.  Skin:    General: Skin is warm and dry.     Capillary Refill: Capillary refill takes less than 2 seconds.  Neurological:     General: No focal deficit present.     Mental Status: He is alert and oriented to person, place, and time.  Psychiatric:        Mood and Affect: Mood normal.     (all labs ordered are listed, but only abnormal results are displayed) Labs Reviewed  CBC WITH DIFFERENTIAL/PLATELET  COMPREHENSIVE METABOLIC PANEL WITH GFR  LIPASE, BLOOD  RETICULOCYTES  URINALYSIS, ROUTINE W REFLEX MICROSCOPIC    EKG: None  Radiology: No results found.   Procedures   Medications Ordered in the ED  dextrose  5 % and 0.45 % NaCl infusion (has no administration in time range)  HYDROmorphone  (DILAUDID ) injection 2 mg (has no administration in time range)  HYDROmorphone  (DILAUDID ) injection 2 mg (has no administration in time range)  HYDROmorphone  (DILAUDID ) injection 2 mg (has no administration in time range)  diphenhydrAMINE  (BENADRYL ) 12.5 mg in sodium chloride  0.9 % 50 mL IVPB (has no administration in time range)  ondansetron  (ZOFRAN ) injection 4 mg (has no administration in time range)                                    Medical Decision Making Amount and/or Complexity of Data Reviewed Labs: ordered. Radiology: ordered.  Risk Prescription drug management.   Will start sickle cell protocol for pain.  But also will get chest x-ray and CT scan abdomen pelvis.  Also did add on complete metabolic panel and lipase.  To further evaluate the abdominal pain.  CRITICAL CARE Performed by: Yuriel Lopezmartinez Total critical care time: 45 minutes Critical care time was exclusive of separately billable procedures and treating other patients. Critical care was necessary to treat or prevent imminent or life-threatening  deterioration. Critical care was time spent personally by me on the following activities: development of treatment plan with patient and/or surrogate as well as nursing, discussions with consultants, evaluation of patient's response to treatment, examination of patient, obtaining history from patient or surrogate, ordering and performing treatments and interventions, ordering and review of laboratory studies, ordering and review of radiographic studies, pulse oximetry and re-evaluation of patient's condition.   Patient CBC white count 11 hemoglobin 9.2 which is better than what it was just yesterday.  Platelets are 227.  Complete metabolic panel normal except for total bili up at 1.9 renal function is normal electrolytes normal.  Lipase normal at  30.  All very reassuring.  Reticular cell count is actually normal at 2.3.  Chest x-ray cardiomegaly but no acute process.  CT scan abdomen and pelvis is pending and the rest of the sickle cell protocol pending.  CT scan results are back chronic changes consistent with given clinical history no acute abnormalities.  This very reassuring.  Will have patient reassessed after he is completed his sickle cell protocol  Final diagnoses:  Sickle cell pain crisis [facility])    ED Discharge Orders     None          Geraldene Hamilton, MD 01/14/24 7798    Geraldene Hamilton, MD 01/14/24 7691    Geraldene Hamilton, MD 01/14/24 [phone]

## 2024-01-15 ENCOUNTER — Encounter (HOSPITAL_COMMUNITY): Payer: Self-pay | Admitting: Emergency Medicine

## 2024-01-15 ENCOUNTER — Other Ambulatory Visit: Payer: Self-pay

## 2024-01-15 ENCOUNTER — Inpatient Hospital Stay (HOSPITAL_COMMUNITY)
Admission: EM | Admit: 2024-01-15 | Discharge: 2024-01-19 | DRG: 812 | Disposition: A | Discharge status: Home / Self Care | Attending: Internal Medicine | Admitting: Internal Medicine

## 2024-01-15 DIAGNOSIS — D72829 Elevated white blood cell count, unspecified: Secondary | ICD-10-CM | POA: Diagnosis present

## 2024-01-15 DIAGNOSIS — Z832 Family history of diseases of the blood and blood-forming organs and certain disorders involving the immune mechanism: Secondary | ICD-10-CM | POA: Diagnosis not present

## 2024-01-15 DIAGNOSIS — G894 Chronic pain syndrome: Secondary | ICD-10-CM | POA: Diagnosis not present

## 2024-01-15 DIAGNOSIS — I517 Cardiomegaly: Secondary | ICD-10-CM | POA: Diagnosis not present

## 2024-01-15 DIAGNOSIS — I119 Hypertensive heart disease without heart failure: Secondary | ICD-10-CM | POA: Diagnosis present

## 2024-01-15 DIAGNOSIS — I10 Essential (primary) hypertension: Secondary | ICD-10-CM | POA: Diagnosis not present

## 2024-01-15 DIAGNOSIS — R739 Hyperglycemia, unspecified: Secondary | ICD-10-CM | POA: Diagnosis not present

## 2024-01-15 DIAGNOSIS — Z833 Family history of diabetes mellitus: Secondary | ICD-10-CM | POA: Diagnosis not present

## 2024-01-15 DIAGNOSIS — D5700 Hb-SS disease with crisis, unspecified: Principal | ICD-10-CM | POA: Diagnosis present

## 2024-01-15 DIAGNOSIS — M419 Scoliosis, unspecified: Secondary | ICD-10-CM | POA: Diagnosis not present

## 2024-01-15 DIAGNOSIS — D638 Anemia in other chronic diseases classified elsewhere: Secondary | ICD-10-CM | POA: Diagnosis present

## 2024-01-15 DIAGNOSIS — R1013 Epigastric pain: Secondary | ICD-10-CM | POA: Diagnosis not present

## 2024-01-15 DIAGNOSIS — Z8249 Family history of ischemic heart disease and other diseases of the circulatory system: Secondary | ICD-10-CM

## 2024-01-15 DIAGNOSIS — R1011 Right upper quadrant pain: Secondary | ICD-10-CM | POA: Diagnosis not present

## 2024-01-15 DIAGNOSIS — Z79899 Other long term (current) drug therapy: Secondary | ICD-10-CM | POA: Diagnosis not present

## 2024-01-15 DIAGNOSIS — Z9049 Acquired absence of other specified parts of digestive tract: Secondary | ICD-10-CM | POA: Diagnosis not present

## 2024-01-15 DIAGNOSIS — R109 Unspecified abdominal pain: Secondary | ICD-10-CM | POA: Diagnosis not present

## 2024-01-15 LAB — COMPREHENSIVE METABOLIC PANEL WITH GFR
ALT: 14 U/L (ref 0–44)
AST: 32 U/L (ref 15–41)
Albumin: 4.1 g/dL (ref 3.5–5.0)
Alkaline Phosphatase: 89 U/L (ref 38–126)
Anion gap: 9 (ref 5–15)
BUN: <5 mg/dL — ABNORMAL LOW (ref 6–20)
CO2: 23 mmol/L (ref 22–32)
Calcium: 9.2 mg/dL (ref 8.9–10.3)
Chloride: 105 mmol/L (ref 98–111)
Creatinine, Ser: 0.85 mg/dL (ref 0.61–1.24)
GFR, Estimated: >60 mL/min (ref >60)
Glucose, Bld: 146 mg/dL — ABNORMAL HIGH (ref 70–99)
Potassium: 3.5 mmol/L (ref 3.5–5.1)
Sodium: 137 mmol/L (ref 135–145)
Total Bilirubin: 2.3 mg/dL — ABNORMAL HIGH (ref 0.0–1.2)
Total Protein: 8.5 g/dL — ABNORMAL HIGH (ref 6.5–8.1)

## 2024-01-15 LAB — CBC WITH DIFFERENTIAL/PLATELET
Abs Granulocyte: 6.3 K/uL (ref 1.5–6.5)
Abs Immature Granulocytes: 0.07 K/uL (ref 0.00–0.07)
Basophils Absolute: 0.1 K/uL (ref 0.0–0.1)
Basophils Relative: 1 %
Eosinophils Absolute: 0.1 K/uL (ref 0.0–0.5)
Eosinophils Relative: 1 %
HCT: 25.8 % — ABNORMAL LOW (ref 39.0–52.0)
Hemoglobin: 9.1 g/dL — ABNORMAL LOW (ref 13.0–17.0)
Immature Granulocytes: 1 %
Lymphocytes Relative: 37 %
Lymphs Abs: 4.5 K/uL — ABNORMAL HIGH (ref 0.7–4.0)
MCH: 36 pg — ABNORMAL HIGH (ref 26.0–34.0)
MCHC: 35.3 g/dL (ref 30.0–36.0)
MCV: 102 fL — ABNORMAL HIGH (ref 80.0–100.0)
Monocytes Absolute: 1.1 K/uL — ABNORMAL HIGH (ref 0.1–1.0)
Monocytes Relative: 9 %
Neutro Abs: 6.3 K/uL (ref 1.7–7.7)
Neutrophils Relative %: 51 %
Platelets: 219 K/uL (ref 150–400)
RBC: 2.53 MIL/uL — ABNORMAL LOW (ref 4.22–5.81)
RDW: 20.7 % — ABNORMAL HIGH (ref 11.5–15.5)
WBC: 12.1 K/uL — ABNORMAL HIGH (ref 4.0–10.5)
nRBC: 3.3 % — ABNORMAL HIGH (ref 0.0–0.2)

## 2024-01-15 LAB — RETICULOCYTES
Immature Retic Fract: 36 % — ABNORMAL HIGH (ref 2.3–15.9)
RBC.: 2.5 MIL/uL — ABNORMAL LOW (ref 4.22–5.81)
Retic Count, Absolute: 35 K/uL (ref 19.0–186.0)
Retic Ct Pct: 1.4 % (ref 0.4–3.1)

## 2024-01-15 LAB — LIPASE, BLOOD: Lipase: 33 U/L (ref 11–51)

## 2024-01-15 MED ORDER — LISINOPRIL 5 MG PO TABS
2.5000 mg | ORAL_TABLET | Freq: Every day | ORAL | Status: DC
  Administered 2024-01-15 – 2024-01-18 (×7): 2.5 mg via ORAL
  Filled 2024-01-15 (×5): qty 1

## 2024-01-15 MED ORDER — ONDANSETRON HCL 4 MG/2ML IJ SOLN
4.0000 mg | INTRAMUSCULAR | Status: DC | PRN
  Administered 2024-01-15 – 2024-01-18 (×7): 4 mg via INTRAVENOUS
  Filled 2024-01-15 (×4): qty 2

## 2024-01-15 MED ORDER — NALOXONE HCL 0.4 MG/ML IJ SOLN: 0.4000 mg | INTRAMUSCULAR | Status: DC | PRN

## 2024-01-15 MED ORDER — HYDROMORPHONE HCL 1 MG/ML IJ SOLN
2.0000 mg | INTRAMUSCULAR | Status: AC
  Administered 2024-01-15: 2 mg via INTRAVENOUS
  Filled 2024-01-15: qty 2

## 2024-01-15 MED ORDER — ACETAMINOPHEN 325 MG PO TABS: 650.0000 mg | ORAL_TABLET | Freq: Four times a day (QID) | ORAL | Status: DC | PRN

## 2024-01-15 MED ORDER — SODIUM CHLORIDE 0.9% FLUSH: 9.0000 mL | INTRAVENOUS | Status: DC | PRN

## 2024-01-15 MED ORDER — HYDROMORPHONE HCL 1 MG/ML IJ SOLN
0.5000 mg | INTRAMUSCULAR | Status: DC
  Filled 2024-01-15: qty 1

## 2024-01-15 MED ORDER — SODIUM CHLORIDE 0.45 % IV SOLN: INTRAVENOUS | Status: AC

## 2024-01-15 MED ORDER — KETOROLAC TROMETHAMINE 15 MG/ML IJ SOLN
15.0000 mg | Freq: Four times a day (QID) | INTRAMUSCULAR | Status: AC
  Administered 2024-01-15 – 2024-01-16 (×6): 15 mg via INTRAVENOUS
  Filled 2024-01-15 (×5): qty 1

## 2024-01-15 MED ORDER — HYDROXYUREA 500 MG PO CAPS
1500.0000 mg | ORAL_CAPSULE | Freq: Every day | ORAL | Status: DC
  Administered 2024-01-15: 1500 mg via ORAL
  Filled 2024-01-15: qty 3

## 2024-01-15 MED ORDER — ENOXAPARIN SODIUM 40 MG/0.4ML IJ SOSY
40.0000 mg | PREFILLED_SYRINGE | INTRAMUSCULAR | Status: DC
  Administered 2024-01-15 – 2024-01-18 (×7): 40 mg via SUBCUTANEOUS
  Filled 2024-01-15 (×4): qty 0.4

## 2024-01-15 MED ORDER — HYDROMORPHONE 1 MG/ML IV SOLN
INTRAVENOUS | Status: DC
  Administered 2024-01-15: 7 mg via INTRAVENOUS
  Administered 2024-01-15: 3.5 mg via INTRAVENOUS
  Administered 2024-01-15: 30 mg via INTRAVENOUS
  Administered 2024-01-15: 4.5 mg via INTRAVENOUS
  Administered 2024-01-16: 2.5 mg via INTRAVENOUS
  Administered 2024-01-16: 1 mg via INTRAVENOUS
  Administered 2024-01-16: 2.5 mg via INTRAVENOUS
  Administered 2024-01-16: 30 mg via INTRAVENOUS
  Administered 2024-01-16: 10.5 mg via INTRAVENOUS
  Administered 2024-01-16: 6 mg via INTRAVENOUS
  Administered 2024-01-16: 4.5 mg via INTRAVENOUS
  Administered 2024-01-16: 10.5 mg via INTRAVENOUS
  Administered 2024-01-16: 30 mg via INTRAVENOUS
  Administered 2024-01-16: 4.5 mg via INTRAVENOUS
  Administered 2024-01-16: 1 mg via INTRAVENOUS
  Administered 2024-01-16: 6 mg via INTRAVENOUS
  Administered 2024-01-17: 1 mg via INTRAVENOUS
  Administered 2024-01-17 (×2): 30 mg via INTRAVENOUS
  Administered 2024-01-17: 6 mg via INTRAVENOUS
  Administered 2024-01-17: 5.5 mg via INTRAVENOUS
  Administered 2024-01-17: 2.5 mg via INTRAVENOUS
  Administered 2024-01-17: 5.5 mg via INTRAVENOUS
  Administered 2024-01-17: 6 mg via INTRAVENOUS
  Administered 2024-01-17: 2.5 mg via INTRAVENOUS
  Administered 2024-01-17: 1 mg via INTRAVENOUS
  Administered 2024-01-18 (×2): 30 mg via INTRAVENOUS
  Administered 2024-01-18 (×2): 4 mg via INTRAVENOUS
  Administered 2024-01-18: 3 mg via INTRAVENOUS
  Administered 2024-01-18: 1.5 mg via INTRAVENOUS
  Administered 2024-01-18: 3 mg via INTRAVENOUS
  Administered 2024-01-18 (×2): 3.5 mg via INTRAVENOUS
  Administered 2024-01-18: 1.5 mg via INTRAVENOUS
  Administered 2024-01-18 (×4): 3.5 mg via INTRAVENOUS
  Administered 2024-01-19: 1 mg via INTRAVENOUS
  Administered 2024-01-19: 3.5 mg via INTRAVENOUS
  Administered 2024-01-19: 3 mg via INTRAVENOUS
  Filled 2024-01-15 (×4): qty 30

## 2024-01-15 MED ORDER — FOLIC ACID 1 MG PO TABS
1.0000 mg | ORAL_TABLET | Freq: Every day | ORAL | Status: DC
  Administered 2024-01-15 – 2024-01-18 (×7): 1 mg via ORAL
  Filled 2024-01-15 (×4): qty 1

## 2024-01-15 MED ORDER — HYDROMORPHONE HCL 1 MG/ML IJ SOLN: 1.0000 mg | INTRAMUSCULAR | Status: DC

## 2024-01-15 MED ORDER — HYDROMORPHONE HCL 1 MG/ML IJ SOLN
2.0000 mg | INTRAMUSCULAR | Status: AC
  Administered 2024-01-15 (×2): 2 mg via INTRAVENOUS
  Filled 2024-01-15 (×2): qty 2

## 2024-01-15 MED ORDER — DIPHENHYDRAMINE HCL 25 MG PO CAPS
25.0000 mg | ORAL_CAPSULE | ORAL | Status: DC | PRN
  Administered 2024-01-16 – 2024-01-18 (×10): 25 mg via ORAL
  Filled 2024-01-15 (×5): qty 1

## 2024-01-15 MED ORDER — KETOROLAC TROMETHAMINE 15 MG/ML IJ SOLN
15.0000 mg | INTRAMUSCULAR | Status: AC
  Administered 2024-01-15: 15 mg via INTRAVENOUS
  Filled 2024-01-15: qty 1

## 2024-01-15 MED ORDER — LACTATED RINGERS IV BOLUS
1000.0000 mL | Freq: Once | INTRAVENOUS | Status: AC
  Administered 2024-01-15: 1000 mL via INTRAVENOUS

## 2024-01-15 MED ORDER — OXYCODONE HCL 5 MG PO TABS
5.0000 mg | ORAL_TABLET | ORAL | Status: DC | PRN
  Administered 2024-01-15 – 2024-01-19 (×33): 5 mg via ORAL
  Filled 2024-01-15 (×19): qty 1

## 2024-01-15 NOTE — H&P (Signed)
 History and Physical  KYROS SALZWEDEL FMW:[id number] DOB: November 12, 1990 DOA: 01/15/2024  Referring physician: Dr. Raford, EDP  PCP: Dep, Karolynn Batters, NP  Outpatient Specialists: [facility] clinic Patient coming from: Home.  Chief Complaint: Sickle cell pain crisis.  HPI: Manuel Mcguire is a 33 y.o. male with medical history significant for sickle cell disease, essential hypertension, post lap chole with intraoperative cholangiogram on 11/22/2023, who presents to the ER with back pain and abdominal pain, similar to his typical sickle cell pain crisis.  He also endorses nausea and vomiting since yesterday and inability to keep his oral intake down, including his home analgesics.  Denies subjective fevers or chills.    In the ER, the patient received multiple rounds of IV opiate-based analgesics with minimal effect.  Also received IV fluid and IV antiemetics.  [facility], hospitalist service, was asked to admit for management of sickle cell pain crisis.  ED Course: Temperature 98.1.  BP 140/91, pulse 84, respiratory 18, O2 saturation 100% on room air.  Review of Systems: Review of systems as noted in the HPI. All other systems reviewed and are negative.   Past Medical History:  Diagnosis Date   Scoliosis    Sickle cell anemia with pain (HCC)    Past Surgical History:  Procedure Laterality Date   BACK SURGERY     unaware of particular surgery   CHOLECYSTECTOMY N/A 11/22/2023   Procedure: LAPAROSCOPIC CHOLECYSTECTOMY WITH INTRAOPERATIVE CHOLANGIOGRAM;  Surgeon: Signe Mitzie LABOR, MD;  Location: [facility];  Service: General;  Laterality: N/A;  Poss IOC, ICG   HIP PINNING Left    Pt unaware of particulars    Social History:  reports that he has never smoked. He does not have any smokeless tobacco history on file. He reports that he does not drink alcohol and does not use drugs.   No Known Allergies  Family History  Problem Relation Age of Onset   Diabetes Mother    Hypertension Mother     Sickle cell anemia Sister       Prior to Admission medications   Medication Sig Start Date End Date Taking? Authorizing Provider  cholecalciferol  (VITAMIN D3) 25 MCG (1000 UNIT) tablet Take 1,000 Units by mouth daily.    [provider]  folic acid  (FOLVITE ) 1 MG tablet Take 1 tablet (1 mg total) by mouth daily. 10/27/12   Alvia Rosaline LABOR, MD  hydroxyurea  (HYDREA ) 500 MG capsule Take 3 capsules (1,500 mg total) by mouth daily. May take with food to minimize GI side effects. 10/27/12   Alvia Rosaline LABOR, MD  lisinopril  (ZESTRIL ) 2.5 MG tablet Take 2.5 mg by mouth daily.    [provider]  metoCLOPramide  (REGLAN ) 10 MG tablet Take 1 tablet (10 mg total) by mouth 3 (three) times daily with meals. Patient taking differently: Take 10 mg by mouth 3 (three) times daily as needed for nausea or vomiting. 12/02/23 12/01/24  Cherylene Homer HERO, NP  ondansetron  (ZOFRAN -ODT) 4 MG disintegrating tablet 4mg  ODT q4 hours prn nausea/vomit 11/06/23   Patt Alm Macho, MD  oxyCODONE  (OXY IR/ROXICODONE ) 5 MG immediate release tablet Take 1 tablet (5 mg total) by mouth every 6 (six) hours as needed for severe pain (pain score 7-10). 12/02/23   Ijaola, Onyeje M, NP  pseudoephedrine (SUDAFED) 120 MG 12 hr tablet Take 120 mg by mouth every 12 (twelve) hours as needed for congestion.    [provider]    Physical Exam: BP (!) 149/108 (BP Location: Right Arm)  Pulse 84   Temp 98.1 F (36.7 C) (Oral)   Resp 18   Wt 75 kg   SpO2 93%   BMI 25.90 kg/m   General: 33 y.o. year-old male well developed well nourished in no acute distress.  Alert and oriented x3. Cardiovascular: Regular rate and rhythm with no rubs or gallops.  No thyromegaly or JVD noted.  No lower extremity edema. 2/4 pulses in all 4 extremities. Respiratory: Clear to auscultation with no wheezes or rales. Good inspiratory effort. Abdomen: Soft nontender nondistended with normal bowel sounds x4  quadrants. Muskuloskeletal: No cyanosis, clubbing or edema noted bilaterally Neuro: CN II-XII intact, strength, sensation, reflexes Skin: No ulcerative lesions noted or rashes Psychiatry: Judgement and insight appear normal. Mood is appropriate for condition and setting          Labs on Admission:  Basic Metabolic Panel: Recent Labs  Lab 01/08/24 2000 01/13/24 0629 01/14/24 2209 01/15/24 0142  NA 143 142 142 137  K 3.8 3.8 3.8 3.5  CL 106 111 108 105  CO2 22 21* 22 23  GLUCOSE 100* 97 92 146*  BUN 8 11 <5* <5*  CREATININE 0.85 0.89 0.78 0.85  CALCIUM 9.9 9.1 9.4 9.2   Liver Function Tests: Recent Labs  Lab 01/08/24 2000 01/13/24 0629 01/14/24 2209 01/15/24 0142  AST 38 31 35 32  ALT 22 12 12 14   ALKPHOS 120 97 101 89  BILITOT 2.3* 1.4* 1.9* 2.3*  PROT 9.1* 7.8 8.3* 8.5*  ALBUMIN 5.0 4.1 4.4 4.1   Recent Labs  Lab 01/08/24 2000 01/14/24 2209 01/15/24 0142  LIPASE 36 30 33   No results for input(s): AMMONIA in the last 168 hours. CBC: Recent Labs  Lab 01/08/24 2000 01/13/24 0629 01/14/24 2209 01/15/24 0142  WBC 10.8* 13.7* 11.0* 12.1*  NEUTROABS 4.4 9.3* 7.1 6.3  HGB 9.5* 8.7* 9.2* 9.1*  HCT 26.0* 23.2* 25.0* 25.8*  MCV 98.9 98.3 100.0 102.0*  PLT 274 208 227 219   Cardiac Enzymes: No results for input(s): CKTOTAL, CKMB, CKMBINDEX, TROPONINI in the last 168 hours.  BNP (last 3 results) No results for input(s): BNP in the last 8760 hours.  ProBNP (last 3 results) No results for input(s): PROBNP in the last 8760 hours.  CBG: No results for input(s): GLUCAP in the last 168 hours.  Radiological Exams on Admission: CT ABDOMEN PELVIS W CONTRAST Result Date: 01/14/2024 CLINICAL DATA:  Acute abdominal pain, history of sickle cell disease EXAM: CT ABDOMEN AND PELVIS WITH CONTRAST TECHNIQUE: Multidetector CT imaging of the abdomen and pelvis was performed using the standard protocol following bolus administration of intravenous contrast.  RADIATION DOSE REDUCTION: This exam was performed according to the departmental dose-optimization program which includes automated exposure control, adjustment of the mA and/or kV according to patient size and/or use of iterative reconstruction technique. CONTRAST:  OMNIPAQUE  IOHEXOL  300 MG/ML  SOLN COMPARISON:  12/16/2023 FINDINGS: Lower chest: Lung bases are free of acute infiltrate or sizable effusion. Hepatobiliary: No focal liver abnormality is seen. Status post cholecystectomy. No biliary dilatation. Pancreas: Unremarkable. No pancreatic ductal dilatation or surrounding inflammatory changes. Spleen: Auto infarction of the spleen is noted. Adrenals/Urinary Tract: Adrenal glands are within normal limits. Kidneys demonstrate a normal enhancement pattern. No calculi are seen. No obstructive changes are noted. The bladder is decompressed. Stomach/Bowel: The appendix is within normal limits. No obstructive or inflammatory changes of the colon are seen. Small bowel and stomach are unremarkable. Vascular/Lymphatic: No significant vascular findings are present. No  enlarged abdominal or pelvic lymph nodes. Reproductive: Prostate is unremarkable. Other: No abdominal wall hernia or abnormality. No abdominopelvic ascites. Musculoskeletal: Postsurgical changes are noted in the proximal left hip. Generalized increased density is noted throughout the bony structures consistent with the given clinical history. Postsurgical changes are noted in the lower thoracic spine. No hardware failure is noted. IMPRESSION: Chronic changes consistent with the given clinical history. No acute abnormality noted. Electronically Signed   By: Oneil Devonshire M.D.   On: 01/14/2024 23:31   DG Chest Port 1 View Result Date: 01/14/2024 CLINICAL DATA:  Right upper quadrant abdominal pain EXAM: PORTABLE CHEST 1 VIEW COMPARISON:  Chest x-ray 12/16/2023 FINDINGS: The heart is enlarged. The lungs are clear. There is no pleural effusion or  pneumothorax. Thoracic spinal fusion hardware is present. No acute fractures are seen. IMPRESSION: Cardiomegaly. No acute cardiopulmonary process. Electronically Signed   By: Greig Pique M.D.   On: 01/14/2024 22:28    EKG: I independently viewed the EKG done and my findings are as followed: Normal sinus rhythm rate of 73.  Nonspecific ST-T changes.  QTc 427. Assessment/Plan Present on Admission:  Sickle cell pain crisis (HCC)  Principal Problem:   Sickle cell pain crisis (HCC)  Sickle cell pain crisis, unclear trigger Continue as needed analgesics PCA pump Dilaudid  IV fluid hydration half-normal saline at 125 cc/h x 1 day As needed stool softeners/laxatives Resume home Hydrea  with folic acid  supplement  Anemia of chronic disease in the setting of sickle cell Hemoglobin 9.1, with no reported bleeding. Repeat CBC tomorrow  Leukocytosis, suspect reactive in the setting of sickle cell crisis Presented with WBC of 12.1 Afebrile Monitor for now repeat CBC in the morning  Essential hypertension BP is not at goal, elevated Resume home lisinopril  Continue to monitor vital signs   Time: 75 minutes.   DVT prophylaxis: Subcu Lovenox  daily.  Code Status: Full code.  Family Communication: Significant other at bedside.  Disposition Plan: Admitted to telemetry unit.  Consults called: None.  Admission status: Inpatient status.   Status is: Inpatient The patient will require at least 2 midnights for further evaluation and treatment of present condition.   Terry LOISE Hurst MD [facility] Pager [phone]  If 7PM-7AM, please contact night-coverage [email or web address] Password [email or web address]  01/15/2024, 5:11 AM

## 2024-01-15 NOTE — Progress Notes (Signed)
 Patient admitted early this morning with abdominal pain, sickle cell pain crisis as well as uncontrolled hypertension.  Patient has leukocytosis.  Will keep patient on his regimen of Dilaudid  PCA, will add Toradol , IV fluids.  Continue antihypertensives including lisinopril  and monitor renal function.  Continue hydration.

## 2024-01-15 NOTE — Plan of Care (Signed)
  Problem: Education: Goal: Knowledge of vaso-occlusive preventative measures will improve Outcome: Progressing   Problem: Clinical Measurements: Goal: Ability to maintain clinical measurements within normal limits will improve Outcome: Progressing

## 2024-01-15 NOTE — ED Provider Notes (Signed)
 Care of patient received from prior provider at 12:22 AM, please see their note for complete H/P and care plan.  Received handoff per ED course.  Clinical Course as of 01/15/24 FONTAINE Repress Jan 15, 2024  0007 Stable Chronic abdominal pain.  SCPC Getting meds and anticipate discharge. Labs back to baseline PO ing now  [CC]    Clinical Course User Index [CC] Jerral Meth, MD    Reassessment: Evaluated patient primarily at bedside, patient was sitting upright.  He been able to tolerate p.o. intake without any difficulty per nursing.  Playing on his phone headphones on.  Very comfortable in the bed.  He then looks to me and states that he is in 12 out of 10 pain. Patient has been seen numerous time recently.  His lab work is continuing to be at his baseline. CT scan performed by prior provider with no focal pathology. Patient in no acute distress at this time.  I believe this more represents his chronic pain rather than an emergent sickle cell crisis based on the laboratory evaluation, sequential nature of his visits over the weekends when the [facility] clinic is closed. Encouraged him to follow-up with his normal sickle cell pain team in the outpatient setting.     Jerral Meth, MD 01/15/24 [phone]

## 2024-01-15 NOTE — ED Provider Notes (Signed)
 [place] [facility] AT [facility] Provider Note   CSN: [id number] Arrival date & time: 01/15/24  0107     Patient presents with: Sickle Cell Pain   Manuel Mcguire is a 33 y.o. male.   The history is provided by the patient.   He has history of sickle cell disease and comes in because of pain in his upper abdomen and mid back which started yesterday.  There has been associated vomiting.  He endorses a small amount of diarrhea.  He denies fever, chills, sweats.  He denies chest pain, dyspnea.    Prior to Admission medications   Medication Sig Start Date End Date Taking? Authorizing Provider  cholecalciferol  (VITAMIN D3) 25 MCG (1000 UNIT) tablet Take 1,000 Units by mouth daily.    [provider]  folic acid  (FOLVITE ) 1 MG tablet Take 1 tablet (1 mg total) by mouth daily. 10/27/12   Alvia Rosaline LABOR, MD  hydroxyurea  (HYDREA ) 500 MG capsule Take 3 capsules (1,500 mg total) by mouth daily. May take with food to minimize GI side effects. 10/27/12   Alvia Rosaline LABOR, MD  lisinopril  (ZESTRIL ) 2.5 MG tablet Take 2.5 mg by mouth daily.    [provider]  metoCLOPramide  (REGLAN ) 10 MG tablet Take 1 tablet (10 mg total) by mouth 3 (three) times daily with meals. Patient taking differently: Take 10 mg by mouth 3 (three) times daily as needed for nausea or vomiting. 12/02/23 12/01/24  Cherylene Homer HERO, NP  ondansetron  (ZOFRAN -ODT) 4 MG disintegrating tablet 4mg  ODT q4 hours prn nausea/vomit 11/06/23   Patt Alm Macho, MD  oxyCODONE  (OXY IR/ROXICODONE ) 5 MG immediate release tablet Take 1 tablet (5 mg total) by mouth every 6 (six) hours as needed for severe pain (pain score 7-10). 12/02/23   Ijaola, Onyeje M, NP  pseudoephedrine (SUDAFED) 120 MG 12 hr tablet Take 120 mg by mouth every 12 (twelve) hours as needed for congestion.    [provider]    Allergies: Patient has no known allergies.    Review of Systems  All other systems  reviewed and are negative.   Updated Vital Signs BP (!) 149/108 (BP Location: Right Arm)   Pulse 84   Temp 98.1 F (36.7 C) (Oral)   Resp 18   Wt 75 kg   SpO2 93%   BMI 25.90 kg/m   Physical Exam Vitals and nursing note reviewed.   33 year old male, resting comfortably and in no acute distress. Vital signs are significant for elevated blood pressure. Oxygen saturation is 93%, which is normal. Head is normocephalic and atraumatic. PERRLA, EOMI. Oropharynx is clear. Neck is nontender and supple without adenopathy. Back is nontender and there is no CVA tenderness. Lungs are clear without rales, wheezes, or rhonchi. Chest is nontender. Heart has regular rate and rhythm without murmur. Abdomen is soft, flat, nontender. Extremities have no cyanosis or edema, full range of motion is present. Skin is warm and dry without rash. Neurologic: Awake and alert, moves all extremities equally.  (all labs ordered are listed, but only abnormal results are displayed) Labs Reviewed  CBC WITH DIFFERENTIAL/PLATELET - Abnormal; Notable for the following components:      Result Value   WBC 12.1 (*)    RBC 2.53 (*)    Hemoglobin 9.1 (*)    HCT 25.8 (*)    MCV 102.0 (*)    MCH 36.0 (*)    RDW 20.7 (*)    nRBC 3.3 (*)  Lymphs Abs 4.5 (*)    Monocytes Absolute 1.1 (*)    All other components within normal limits  COMPREHENSIVE METABOLIC PANEL WITH GFR - Abnormal; Notable for the following components:   Glucose, Bld 146 (*)    BUN <5 (*)    Total Protein 8.5 (*)    Total Bilirubin 2.3 (*)    All other components within normal limits  RETICULOCYTES - Abnormal; Notable for the following components:   RBC. 2.50 (*)    Immature Retic Fract 36.0 (*)    All other components within normal limits  LIPASE, BLOOD      Procedures   Medications Ordered in the ED  ondansetron  (ZOFRAN ) injection 4 mg (has no administration in time range)  ketorolac  (TORADOL ) 15 MG/ML injection 15 mg (15 mg  Intravenous Given 01/15/24 0146)  lactated ringers  bolus 1,000 mL (0 mLs Intravenous Stopped 01/15/24 0300)  HYDROmorphone  (DILAUDID ) injection 2 mg (2 mg Intravenous Given 01/15/24 0208)  HYDROmorphone  (DILAUDID ) injection 2 mg (2 mg Intravenous Given 01/15/24 0231)  HYDROmorphone  (DILAUDID ) injection 2 mg (2 mg Intravenous Given 01/15/24 0304)                                    Medical Decision Making Amount and/or Complexity of Data Reviewed Labs: ordered.  Risk Prescription drug management. Decision regarding hospitalization.   Abdominal pain and back pain in a patient with sickle cell disease, pain typical of his sickle cell crises.  I have reviewed his past records, and he has 2 prior ED visits for similar symptoms over the last week, several hospitalizations for sickle cell disease most recently on 12/08/2023.  I have ordered the sickle cell pain protocol and I have also ordered screening labs.  I have reviewed his laboratory tests, and my interpretation is mild leukocytosis which is nonspecific, stable anemia, elevated random glucose level which will need to be followed as an outpatient, elevated total bilirubin as expected in patient with sickle cell disease, normal lipase, reticulocyte count lower than expected.  He failed to get adequate pain relief after 3 injections of hydromorphone  and will need to be admitted.  I have discussed case with Dr. Shona of [facility], who agrees to admit the patient.     Final diagnoses:  Sickle cell pain crisis (HCC)  Elevated random blood glucose level    ED Discharge Orders     None          Raford Lenis, MD 01/15/24 [phone]

## 2024-01-15 NOTE — Plan of Care (Signed)

## 2024-01-15 NOTE — Plan of Care (Signed)
  Problem: Education: Goal: Knowledge of vaso-occlusive preventative measures will improve Outcome: Progressing   Problem: Self-Care: Goal: Ability to incorporate actions that prevent/reduce pain crisis will improve Outcome: Progressing   Problem: Tissue Perfusion: Goal: Complications related to inadequate tissue perfusion will be avoided or minimized Outcome: Progressing   Problem: Respiratory: Goal: Pulmonary complications will be avoided or minimized Outcome: Progressing Goal: Acute Chest Syndrome will be identified early to prevent complications Outcome: Progressing   Problem: Sensory: Goal: Pain level will decrease with appropriate interventions Outcome: Progressing   Problem: Health Behavior: Goal: Postive changes in compliance with treatment and prescription regimens will improve Outcome: Progressing   Problem: Education: Goal: Knowledge of General Education information will improve Description: Including pain rating scale, medication(s)/side effects and non-pharmacologic comfort measures Outcome: Progressing   Problem: Nutrition: Goal: Adequate nutrition will be maintained Outcome: Progressing

## 2024-01-15 NOTE — ED Triage Notes (Signed)
 Pt in with reported sickle cell pain to low back and RUQ. Pt just seen at [facility] and discharged following labs and imaging. Also reports emesis that continues 1.98mo after cholecystectomy.

## 2024-01-16 LAB — CBC
HCT: 22.1 % — ABNORMAL LOW (ref 39.0–52.0)
Hemoglobin: 7.8 g/dL — ABNORMAL LOW (ref 13.0–17.0)
MCH: 36.8 pg — ABNORMAL HIGH (ref 26.0–34.0)
MCHC: 35.3 g/dL (ref 30.0–36.0)
MCV: 104.2 fL — ABNORMAL HIGH (ref 80.0–100.0)
Platelets: 175 K/uL (ref 150–400)
RBC: 2.12 MIL/uL — ABNORMAL LOW (ref 4.22–5.81)
RDW: 21.1 % — ABNORMAL HIGH (ref 11.5–15.5)
WBC: 10.4 K/uL (ref 4.0–10.5)
nRBC: 4.3 % — ABNORMAL HIGH (ref 0.0–0.2)

## 2024-01-16 LAB — BASIC METABOLIC PANEL WITH GFR
Anion gap: 9 (ref 5–15)
BUN: 7 mg/dL (ref 6–20)
CO2: 25 mmol/L (ref 22–32)
Calcium: 8.7 mg/dL — ABNORMAL LOW (ref 8.9–10.3)
Chloride: 105 mmol/L (ref 98–111)
Creatinine, Ser: 0.73 mg/dL (ref 0.61–1.24)
GFR, Estimated: >60 mL/min (ref >60)
Glucose, Bld: 101 mg/dL — ABNORMAL HIGH (ref 70–99)
Potassium: 3.4 mmol/L — ABNORMAL LOW (ref 3.5–5.1)
Sodium: 139 mmol/L (ref 135–145)

## 2024-01-16 LAB — PHOSPHORUS: Phosphorus: 4.5 mg/dL (ref 2.5–4.6)

## 2024-01-16 LAB — MAGNESIUM: Magnesium: 1.7 mg/dL (ref 1.7–2.4)

## 2024-01-16 NOTE — Progress Notes (Signed)
   01/16/24 0953  TOC Brief Assessment  Insurance and Status Reviewed  Patient has primary care physician Yes  Home environment has been reviewed home w/ family  Prior level of function: independent  Prior/Current Home Services No current home services  Social Drivers of Health Review SDOH reviewed no interventions necessary  Readmission risk has been reviewed Yes  Transition of care needs no transition of care needs at this time

## 2024-01-16 NOTE — Progress Notes (Addendum)
 Patient ID: VRISHANK MOSTER, male   DOB: [birth year], 33 y.o.   MRN: [id number] Subjective: Kirill Chatterjee is a 33 year old male with a medical history significant for sickle cell disease, hypertension, post lap chole with intraoperative cholangiogram on 11/22/2023.  Was admitted from the emergency department for abdominal pain, slight nausea, similar to his sickle cell pain crisis.    Today patient is reporting improved pain of 6/10 but not quite at baseline.  He has no new concerns.  No fever, nausea resolved, no headache, no diarrhea.  No urinary symptoms.  Objective:  Vital signs in last 24 hours:  Vitals:   01/16/24 0421 01/16/24 0433 01/16/24 0852 01/16/24 1217  BP:  (!) 143/97    Pulse:  71    Resp: 18 15 15 15   Temp:  98.1 F (36.7 C)    TempSrc:  Oral    SpO2: 97% 98% 97% 98%  Weight:      Height:        Intake/Output from previous day:   Intake/Output Summary (Last 24 hours) at 01/16/2024 1428 Last data filed at 01/16/2024 1230 Gross per 24 hour  Intake 1837.06 ml  Output 2475 ml  Net -637.94 ml    Physical Exam: General: Alert, awake, oriented x3, in no acute distress.  HEENT: [place]/AT [place]ERL, EOMI Neck: Trachea midline,  no masses, no thyromegal,y no JVD, no carotid bruit OROPHARYNX:  Moist, No exudate/ erythema/lesions.  Heart: Regular rate and rhythm, without murmurs, rubs, gallops, PMI non-displaced, no heaves or thrills on palpation.  Lungs: Clear to auscultation, no wheezing or rhonchi noted. No increased vocal fremitus resonant to percussion  Abdomen: Soft, nontender, nondistended, positive bowel sounds, no masses no hepatosplenomegaly noted..  Neuro: No focal neurological deficits noted cranial nerves II through XII grossly intact. DTRs 2+ bilaterally upper and lower extremities. Strength 5 out of 5 in bilateral upper and lower extremities. Musculoskeletal: Mild abdominal tenderness.  Generalized  body tenderness.  Psychiatric: Patient alert and oriented x3,  good insight and cognition, good recent to remote recall. Lymph node survey: No cervical axillary or inguinal lymphadenopathy noted.  Lab Results:  Basic Metabolic Panel:    Component Value Date/Time   NA 139 01/16/2024 0550   K 3.4 (L) 01/16/2024 0550   CL 105 01/16/2024 0550   CO2 25 01/16/2024 0550   BUN 7 01/16/2024 0550   CREATININE 0.73 01/16/2024 0550   GLUCOSE 101 (H) 01/16/2024 0550   CALCIUM 8.7 (L) 01/16/2024 0550   CBC:    Component Value Date/Time   WBC 10.4 01/16/2024 0550   HGB 7.8 (L) 01/16/2024 0550   HCT 22.1 (L) 01/16/2024 0550   PLT 175 01/16/2024 0550   MCV 104.2 (H) 01/16/2024 0550   NEUTROABS 6.3 01/15/2024 0142   LYMPHSABS 4.5 (H) 01/15/2024 0142   MONOABS 1.1 (H) 01/15/2024 0142   EOSABS 0.1 01/15/2024 0142   BASOSABS 0.1 01/15/2024 0142    No results found for this or any previous visit (from the past 240 hours).  Studies/Results: CT ABDOMEN PELVIS W CONTRAST Result Date: 01/14/2024 CLINICAL DATA:  Acute abdominal pain, history of sickle cell disease EXAM: CT ABDOMEN AND PELVIS WITH CONTRAST TECHNIQUE: Multidetector CT imaging of the abdomen and pelvis was performed using the standard protocol following bolus administration of intravenous contrast. RADIATION DOSE REDUCTION: This exam was performed according to the departmental dose-optimization program which includes automated exposure control, adjustment of the mA and/or kV according to patient size and/or use of iterative reconstruction  technique. CONTRAST:  OMNIPAQUE  IOHEXOL  300 MG/ML  SOLN COMPARISON:  12/16/2023 FINDINGS: Lower chest: Lung bases are free of acute infiltrate or sizable effusion. Hepatobiliary: No focal liver abnormality is seen. Status post cholecystectomy. No biliary dilatation. Pancreas: Unremarkable. No pancreatic ductal dilatation or surrounding inflammatory changes. Spleen: Auto infarction of the spleen is noted. Adrenals/Urinary Tract: Adrenal glands are within normal  limits. Kidneys demonstrate a normal enhancement pattern. No calculi are seen. No obstructive changes are noted. The bladder is decompressed. Stomach/Bowel: The appendix is within normal limits. No obstructive or inflammatory changes of the colon are seen. Small bowel and stomach are unremarkable. Vascular/Lymphatic: No significant vascular findings are present. No enlarged abdominal or pelvic lymph nodes. Reproductive: Prostate is unremarkable. Other: No abdominal wall hernia or abnormality. No abdominopelvic ascites. Musculoskeletal: Postsurgical changes are noted in the proximal left hip. Generalized increased density is noted throughout the bony structures consistent with the given clinical history. Postsurgical changes are noted in the lower thoracic spine. No hardware failure is noted. IMPRESSION: Chronic changes consistent with the given clinical history. No acute abnormality noted. Electronically Signed   By: Oneil Devonshire M.D.   On: 01/14/2024 23:31   DG Chest Port 1 View Result Date: 01/14/2024 CLINICAL DATA:  Right upper quadrant abdominal pain EXAM: PORTABLE CHEST 1 VIEW COMPARISON:  Chest x-ray 12/16/2023 FINDINGS: The heart is enlarged. The lungs are clear. There is no pleural effusion or pneumothorax. Thoracic spinal fusion hardware is present. No acute fractures are seen. IMPRESSION: Cardiomegaly. No acute cardiopulmonary process. Electronically Signed   By: Greig Pique M.D.   On: 01/14/2024 22:28    Medications: Scheduled Meds:  enoxaparin  (LOVENOX ) injection  40 mg Subcutaneous Q24H   folic acid   1 mg Oral Daily   HYDROmorphone    Intravenous Q4H   lisinopril   2.5 mg Oral Daily   Continuous Infusions: PRN Meds:.acetaminophen , diphenhydrAMINE , naloxone  **AND** sodium chloride  flush, ondansetron , oxyCODONE   Consultants: None  Procedures: None  Antibiotics: None  Assessment/Plan: Principal Problem:   Sickle cell pain crisis (HCC) Active Problems:   Anemia of chronic  disease   Leukocytosis   Chronic pain syndrome   Hb Sickle Cell Disease with Pain crisis: Continue IVF 0.45% Saline @KVO , continue weight based Dilaudid  PCA, continue oral home pain medications as ordered. Monitor vitals very closely, Re-evaluate pain scale regularly, 2 L of Oxygen by [place]. Patient encouraged to ambulate on the hallway today.  Leukocytosis: stable Anemia of Chronic Disease: Lower than patient's baseline, However will not transfuse at this time but will continue to monitor daily CBC and transfuse when medically appropriate. Chronic pain Syndrome: Continue oral home pain medication.  Code Status: Full Code Family Communication: N/A Disposition Plan: Not yet ready for discharge  [name] Cover NP  If 7PM-7AM, please contact night-coverage.  01/16/2024, 2:28 PM  LOS: 1 day

## 2024-01-16 NOTE — Progress Notes (Signed)
 Pharmacy: [facility]    Hydroxyurea  ([facility] ) hold criteria in sickle cell disease: ANC < 2K Pltc < 80K  Hgb <= 6 g/dL Reticulocytes < 19X when Hgb < 9 g/dL   - 1/88: Hgb 7.8, retic 35K   Plan: - Will hold hydrea  for now per P&T policy  Iantha Batch, PharmD, BCPS 01/16/2024 7:34 AM

## 2024-01-17 LAB — CBC
HCT: 22.2 % — ABNORMAL LOW (ref 39.0–52.0)
Hemoglobin: 7.9 g/dL — ABNORMAL LOW (ref 13.0–17.0)
MCH: 36.4 pg — ABNORMAL HIGH (ref 26.0–34.0)
MCHC: 35.6 g/dL (ref 30.0–36.0)
MCV: 102.3 fL — ABNORMAL HIGH (ref 80.0–100.0)
Platelets: 221 K/uL (ref 150–400)
RBC: 2.17 MIL/uL — ABNORMAL LOW (ref 4.22–5.81)
RDW: 20.9 % — ABNORMAL HIGH (ref 11.5–15.5)
WBC: 13.4 K/uL — ABNORMAL HIGH (ref 4.0–10.5)
nRBC: 3.7 % — ABNORMAL HIGH (ref 0.0–0.2)

## 2024-01-17 MED ORDER — SENNOSIDES-DOCUSATE SODIUM 8.6-50 MG PO TABS: 1.0000 | ORAL_TABLET | Freq: Once | ORAL | Status: DC | PRN

## 2024-01-17 MED ORDER — POLYETHYLENE GLYCOL 3350 17 G PO PACK
17.0000 g | PACK | Freq: Once | ORAL | Status: AC
  Administered 2024-01-17 (×2): 17 g via ORAL
  Filled 2024-01-17: qty 1

## 2024-01-17 MED ORDER — POLYETHYLENE GLYCOL 3350 17 GM/SCOOP PO POWD
119.0000 g | Freq: Once | ORAL | Status: DC
  Filled 2024-01-17: qty 119

## 2024-01-17 NOTE — Progress Notes (Signed)
 Patient ID: APOLLO TIMOTHY, male   DOB: 05-Sep-1990, 33 y.o.   MRN: [id number] Subjective: Manuel Mcguire is a 33 year old male with a medical history significant for sickle cell disease, hypertension, post lap chole with intraoperative cholangiogram on 11/22/2023.  Was admitted from the emergency department for abdominal pain, slight nausea, similar to his sickle cell pain crisis.    Patient is reporting ongoing pain of 6/10 in his lower back. No fever, nausea has resolved, no headache, no diarrhea.  No urinary symptoms.  Objective:  Vital signs in last 24 hours:  Vitals:   01/17/24 0405 01/17/24 0641 01/17/24 0800 01/17/24 0947  BP:  119/75  (!) 136/95  Pulse:  72  70  Resp: 15 18 18 15   Temp:  98.4 F (36.9 C)  98.2 F (36.8 C)  TempSrc:  Oral  Oral  SpO2:  96%  100%  Weight:      Height:        Intake/Output from previous day:   Intake/Output Summary (Last 24 hours) at 01/17/2024 0948 Last data filed at 01/17/2024 0500 Gross per 24 hour  Intake 240 ml  Output 1325 ml  Net -1085 ml    Physical Exam: General: Alert, awake, oriented x3, in no acute distress.  HEENT: /AT [place], EOMI Neck: Trachea midline,  no masses, no thyromegal,y no JVD, no carotid bruit OROPHARYNX:  Moist, No exudate/ erythema/lesions.  Heart: Regular rate and rhythm, without murmurs, rubs, gallops, PMI non-displaced, no heaves or thrills on palpation.  Lungs: Clear to auscultation, no wheezing or rhonchi noted. No increased vocal fremitus resonant to percussion  Abdomen: Soft, nontender, nondistended, positive bowel sounds, no masses no hepatosplenomegaly noted..  Neuro: No focal neurological deficits noted cranial nerves II through XII grossly intact. DTRs 2+ bilaterally upper and lower extremities. Strength 5 out of 5 in bilateral upper and lower extremities. Musculoskeletal: Mild abdominal tenderness.  Generalized  body tenderness.  Psychiatric: Patient alert and oriented x3, good insight and  cognition, good recent to remote recall. Lymph node survey: No cervical axillary or inguinal lymphadenopathy noted.  Lab Results:  Basic Metabolic Panel:    Component Value Date/Time   NA 139 01/16/2024 0550   K 3.4 (L) 01/16/2024 0550   CL 105 01/16/2024 0550   CO2 25 01/16/2024 0550   BUN 7 01/16/2024 0550   CREATININE 0.73 01/16/2024 0550   GLUCOSE 101 (H) 01/16/2024 0550   CALCIUM 8.7 (L) 01/16/2024 0550   CBC:    Component Value Date/Time   WBC 10.4 01/16/2024 0550   HGB 7.8 (L) 01/16/2024 0550   HCT 22.1 (L) 01/16/2024 0550   PLT 175 01/16/2024 0550   MCV 104.2 (H) 01/16/2024 0550   NEUTROABS 6.3 01/15/2024 0142   LYMPHSABS 4.5 (H) 01/15/2024 0142   MONOABS 1.1 (H) 01/15/2024 0142   EOSABS 0.1 01/15/2024 0142   BASOSABS 0.1 01/15/2024 0142    No results found for this or any previous visit (from the past 240 hours).  Studies/Results: No results found.   Medications: Scheduled Meds:  enoxaparin  (LOVENOX ) injection  40 mg Subcutaneous Q24H   folic acid   1 mg Oral Daily   HYDROmorphone    Intravenous Q4H   lisinopril   2.5 mg Oral Daily   Continuous Infusions: PRN Meds:.acetaminophen , diphenhydrAMINE , naloxone  **AND** sodium chloride  flush, ondansetron , oxyCODONE   Consultants: None  Procedures: None  Antibiotics: None  Assessment/Plan: Principal Problem:   Sickle cell pain crisis (HCC) Active Problems:   Anemia of chronic disease  Leukocytosis   Chronic pain syndrome   Hb Sickle Cell Disease with Pain crisis: Continue IVF 0.45% Saline @KVO  ml's/hour, continue weight based Dilaudid  PCA, continue oral home pain medications as ordered. Monitor vitals very closely, Re-evaluate pain scale regularly, 2 L of Oxygen by [place]. Patient encouraged to ambulate on the hallway today.  Leukocytosis: Stable Anemia of Chronic Disease: Lower than patient's baseline, However will not transfuse at this time but will continue to monitor daily CBC and transfuse when  medically appropriate. Chronic pain Syndrome: Continue oral home pain medication.  Code Status: Full Code Family Communication: N/A Disposition Plan: Not yet ready for discharge  [name] Cover NP  If 7PM-7AM, please contact night-coverage.  01/17/2024, 9:48 AM  LOS: 2 days

## 2024-01-17 NOTE — Plan of Care (Signed)
  Problem: Education: Goal: Knowledge of vaso-occlusive preventative measures will improve Outcome: Progressing Goal: Awareness of infection prevention will improve Outcome: Progressing Goal: Awareness of signs and symptoms of anemia will improve Outcome: Progressing Goal: Long-term complications will improve Outcome: Progressing   Problem: Tissue Perfusion: Goal: Complications related to inadequate tissue perfusion will be avoided or minimized Outcome: Progressing   Problem: Education: Goal: Knowledge of General Education information will improve Description: Including pain rating scale, medication(s)/side effects and non-pharmacologic comfort measures Outcome: Progressing   Problem: Health Behavior/Discharge Planning: Goal: Ability to manage health-related needs will improve Outcome: Progressing

## 2024-01-18 LAB — CBC
HCT: 23.2 % — ABNORMAL LOW (ref 39.0–52.0)
Hemoglobin: 8.4 g/dL — ABNORMAL LOW (ref 13.0–17.0)
MCH: 37.3 pg — ABNORMAL HIGH (ref 26.0–34.0)
MCHC: 36.2 g/dL — ABNORMAL HIGH (ref 30.0–36.0)
MCV: 103.1 fL — ABNORMAL HIGH (ref 80.0–100.0)
Platelets: 225 K/uL (ref 150–400)
RBC: 2.25 MIL/uL — ABNORMAL LOW (ref 4.22–5.81)
RDW: 21.5 % — ABNORMAL HIGH (ref 11.5–15.5)
WBC: 14.6 K/uL — ABNORMAL HIGH (ref 4.0–10.5)
nRBC: 2.5 % — ABNORMAL HIGH (ref 0.0–0.2)

## 2024-01-18 NOTE — Progress Notes (Signed)
 Patient ID: Manuel Mcguire, male   DOB: 03-Jan-1991, 33 y.o.   MRN: [id number] Subjective: Manuel Mcguire is a 33 year old male with a medical history significant for sickle cell disease, hypertension, post lap chole with intraoperative cholangiogram on 11/22/2023.  Was admitted from the emergency department for abdominal pain, slight nausea, similar to his sickle cell pain crisis.    Patient is reporting ongoing pain of 6/10 in his lower back no changes from yesterday.  No fever, nausea has resolved, no headache, no diarrhea.  No urinary symptoms.  Objective:  Vital signs in last 24 hours:  Vitals:   01/18/24 0545 01/18/24 0744 01/18/24 1003 01/18/24 1109  BP: (!) 133/95  134/89   Pulse: 88  72   Resp: 14 14 20 20   Temp: 98.6 F (37 C)  98.2 F (36.8 C)   TempSrc: Oral  Oral   SpO2: 94% 95% 99%   Weight:      Height:        Intake/Output from previous day:   Intake/Output Summary (Last 24 hours) at 01/18/2024 1345 Last data filed at 01/18/2024 0549 Gross per 24 hour  Intake --  Output 1350 ml  Net -1350 ml    Physical Exam: General: Alert, awake, oriented x3, in no acute distress.  HEENT: [place]/AT [place]ER[place], EOMI Neck: Trachea midline,  no masses, no thyromegal,y no JVD, no carotid bruit OROPHARYNX:  Moist, No exudate/ erythema/lesions.  Heart: Regular rate and rhythm, without murmurs, rubs, gallops, PMI non-displaced, no heaves or thrills on palpation.  Lungs: Clear to auscultation, no wheezing or rhonchi noted. No increased vocal fremitus resonant to percussion  Abdomen: Soft, nontender, nondistended, positive bowel sounds, no masses no hepatosplenomegaly noted..  Neuro: No focal neurological deficits noted cranial nerves II through XII grossly intact. DTRs 2+ bilaterally upper and lower extremities. Strength 5 out of 5 in bilateral upper and lower extremities. Musculoskeletal: Mild abdominal tenderness.  Generalized  body tenderness.  Psychiatric: Patient alert and oriented  x3, good insight and cognition, good recent to remote recall. Lymph node survey: No cervical axillary or inguinal lymphadenopathy noted.  Lab Results:  Basic Metabolic Panel:    Component Value Date/Time   NA 139 01/16/2024 0550   K 3.4 (L) 01/16/2024 0550   CL 105 01/16/2024 0550   CO2 25 01/16/2024 0550   BUN 7 01/16/2024 0550   CREATININE 0.73 01/16/2024 0550   GLUCOSE 101 (H) 01/16/2024 0550   CALCIUM 8.7 (L) 01/16/2024 0550   CBC:    Component Value Date/Time   WBC 14.6 (H) 01/18/2024 0540   HGB 8.4 (L) 01/18/2024 0540   HCT 23.2 (L) 01/18/2024 0540   PLT 225 01/18/2024 0540   MCV 103.1 (H) 01/18/2024 0540   NEUTROABS 6.3 01/15/2024 0142   LYMPHSABS 4.5 (H) 01/15/2024 0142   MONOABS 1.1 (H) 01/15/2024 0142   EOSABS 0.1 01/15/2024 0142   BASOSABS 0.1 01/15/2024 0142    No results found for this or any previous visit (from the past 240 hours).  Studies/Results: No results found.   Medications: Scheduled Meds:  enoxaparin  (LOVENOX ) injection  40 mg Subcutaneous Q24H   folic acid   1 mg Oral Daily   HYDROmorphone    Intravenous Q4H   lisinopril   2.5 mg Oral Daily   Continuous Infusions: PRN Meds:.acetaminophen , diphenhydrAMINE , naloxone  **AND** sodium chloride  flush, ondansetron , oxyCODONE , senna-docusate  Consultants: None  Procedures: None  Antibiotics: None  Assessment/Plan: Principal Problem:   Sickle cell pain crisis (HCC) Active Problems:  Anemia of chronic disease   Leukocytosis   Chronic pain syndrome   Hb Sickle Cell Disease with Pain crisis: Continue IVF 0.45% Saline @KVO  ml's/hour, continue weight based Dilaudid  PCA, continue oral home pain medications as ordered. Monitor vitals very closely, Re-evaluate pain scale regularly, 2 L of Oxygen by [place]. Patient encouraged to ambulate on the hallway today.  Leukocytosis: Stable Anemia of Chronic Disease: Lower than patient's baseline, However will not transfuse at this time but will continue to  monitor daily CBC and transfuse when medically appropriate. Chronic pain Syndrome: Continue oral home pain medication.  Code Status: Full Code Family Communication: N/A Disposition Plan: Not yet ready for discharge  [name] Cover NP  If 7PM-7AM, please contact night-coverage.  01/18/2024, 1:45 PM  LOS: 3 days

## 2024-01-18 NOTE — Plan of Care (Signed)
  Problem: Education: Goal: Knowledge of vaso-occlusive preventative measures will improve Outcome: Progressing Goal: Awareness of infection prevention will improve Outcome: Progressing Goal: Awareness of signs and symptoms of anemia will improve Outcome: Progressing Goal: Long-term complications will improve Outcome: Progressing   Problem: Self-Care: Goal: Ability to incorporate actions that prevent/reduce pain crisis will improve Outcome: Progressing   Problem: Bowel/Gastric: Goal: Gut motility will be maintained Outcome: Progressing   Problem: Tissue Perfusion: Goal: Complications related to inadequate tissue perfusion will be avoided or minimized Outcome: Progressing   Problem: Respiratory: Goal: Pulmonary complications will be avoided or minimized Outcome: Progressing Goal: Acute Chest Syndrome will be identified early to prevent complications Outcome: Progressing   Problem: Fluid Volume: Goal: Ability to maintain a balanced intake and output will improve Outcome: Progressing   Problem: Sensory: Goal: Pain level will decrease with appropriate interventions Outcome: Progressing   Problem: Health Behavior: Goal: Postive changes in compliance with treatment and prescription regimens will improve Outcome: Progressing   Problem: Health Behavior/Discharge Planning: Goal: Ability to manage health-related needs will improve Outcome: Progressing   Problem: Nutrition: Goal: Adequate nutrition will be maintained Outcome: Progressing   Problem: Coping: Goal: Level of anxiety will decrease Outcome: Progressing   Problem: Safety: Goal: Ability to remain free from injury will improve Outcome: Progressing   Problem: Pain Managment: Goal: General experience of comfort will improve and/or be controlled Outcome: Progressing   Problem: Skin Integrity: Goal: Risk for impaired skin integrity will decrease Outcome: Progressing

## 2024-01-18 NOTE — Plan of Care (Signed)

## 2024-01-19 LAB — CBC
HCT: 22.4 % — ABNORMAL LOW (ref 39.0–52.0)
Hemoglobin: 8 g/dL — ABNORMAL LOW (ref 13.0–17.0)
MCH: 36.5 pg — ABNORMAL HIGH (ref 26.0–34.0)
MCHC: 35.7 g/dL (ref 30.0–36.0)
MCV: 102.3 fL — ABNORMAL HIGH (ref 80.0–100.0)
Platelets: 205 K/uL (ref 150–400)
RBC: 2.19 MIL/uL — ABNORMAL LOW (ref 4.22–5.81)
RDW: 20.4 % — ABNORMAL HIGH (ref 11.5–15.5)
WBC: 13.7 K/uL — ABNORMAL HIGH (ref 4.0–10.5)
nRBC: 1.8 % — ABNORMAL HIGH (ref 0.0–0.2)

## 2024-01-19 MED ORDER — SENNOSIDES-DOCUSATE SODIUM 8.6-50 MG PO TABS: 1.0000 | ORAL_TABLET | Freq: Two times a day (BID) | ORAL | Status: DC

## 2024-01-19 NOTE — Plan of Care (Signed)
  Problem: Education: Goal: Knowledge of vaso-occlusive preventative measures will improve Outcome: Progressing Goal: Awareness of infection prevention will improve Outcome: Progressing Goal: Awareness of signs and symptoms of anemia will improve Outcome: Progressing Goal: Long-term complications will improve Outcome: Progressing   Problem: Self-Care: Goal: Ability to incorporate actions that prevent/reduce pain crisis will improve Outcome: Progressing   Problem: Bowel/Gastric: Goal: Gut motility will be maintained Outcome: Progressing   Problem: Tissue Perfusion: Goal: Complications related to inadequate tissue perfusion will be avoided or minimized Outcome: Progressing   Problem: Respiratory: Goal: Pulmonary complications will be avoided or minimized Outcome: Progressing   Problem: Fluid Volume: Goal: Ability to maintain a balanced intake and output will improve Outcome: Progressing   Problem: Sensory: Goal: Pain level will decrease with appropriate interventions Outcome: Progressing   Problem: Health Behavior: Goal: Postive changes in compliance with treatment and prescription regimens will improve Outcome: Progressing   Problem: Education: Goal: Knowledge of General Education information will improve Description: Including pain rating scale, medication(s)/side effects and non-pharmacologic comfort measures Outcome: Progressing   Problem: Clinical Measurements: Goal: Ability to maintain clinical measurements within normal limits will improve Outcome: Progressing Goal: Will remain free from infection Outcome: Progressing Goal: Diagnostic test results will improve Outcome: Progressing Goal: Respiratory complications will improve Outcome: Progressing Goal: Cardiovascular complication will be avoided Outcome: Progressing   Problem: Activity: Goal: Risk for activity intolerance will decrease Outcome: Progressing   Problem: Nutrition: Goal: Adequate nutrition  will be maintained Outcome: Progressing   Problem: Coping: Goal: Level of anxiety will decrease Outcome: Progressing   Problem: Elimination: Goal: Will not experience complications related to bowel motility Outcome: Progressing Goal: Will not experience complications related to urinary retention Outcome: Progressing   Problem: Pain Managment: Goal: General experience of comfort will improve and/or be controlled Outcome: Progressing   Problem: Skin Integrity: Goal: Risk for impaired skin integrity will decrease Outcome: Progressing

## 2024-01-19 NOTE — Plan of Care (Addendum)
 Patient is alert and oriented X4, all discharge information went over with patient. No new medications or appointments listed. AVS completed & pt to call ride and wait in d/c lounge. PIV removed and Dilaudid  PCA wasted. No further needs.   Problem: Education: Goal: Knowledge of vaso-occlusive preventative measures will improve Outcome: Adequate for Discharge Goal: Awareness of infection prevention will improve Outcome: Adequate for Discharge Goal: Awareness of signs and symptoms of anemia will improve Outcome: Adequate for Discharge Goal: Long-term complications will improve Outcome: Adequate for Discharge   Problem: Self-Care: Goal: Ability to incorporate actions that prevent/reduce pain crisis will improve Outcome: Adequate for Discharge   Problem: Bowel/Gastric: Goal: Gut motility will be maintained Outcome: Adequate for Discharge   Problem: Tissue Perfusion: Goal: Complications related to inadequate tissue perfusion will be avoided or minimized Outcome: Adequate for Discharge   Problem: Respiratory: Goal: Pulmonary complications will be avoided or minimized Outcome: Adequate for Discharge Goal: Acute Chest Syndrome will be identified early to prevent complications Outcome: Adequate for Discharge   Problem: Fluid Volume: Goal: Ability to maintain a balanced intake and output will improve Outcome: Adequate for Discharge   Problem: Sensory: Goal: Pain level will decrease with appropriate interventions Outcome: Adequate for Discharge   Problem: Health Behavior: Goal: Postive changes in compliance with treatment and prescription regimens will improve Outcome: Adequate for Discharge   Problem: Education: Goal: Knowledge of General Education information will improve Description: Including pain rating scale, medication(s)/side effects and non-pharmacologic comfort measures Outcome: Adequate for Discharge   Problem: Health Behavior/Discharge Planning: Goal: Ability to  manage health-related needs will improve Outcome: Adequate for Discharge   Problem: Clinical Measurements: Goal: Ability to maintain clinical measurements within normal limits will improve Outcome: Adequate for Discharge Goal: Will remain free from infection Outcome: Adequate for Discharge Goal: Diagnostic test results will improve Outcome: Adequate for Discharge Goal: Respiratory complications will improve Outcome: Adequate for Discharge Goal: Cardiovascular complication will be avoided Outcome: Adequate for Discharge   Problem: Activity: Goal: Risk for activity intolerance will decrease Outcome: Adequate for Discharge   Problem: Nutrition: Goal: Adequate nutrition will be maintained Outcome: Adequate for Discharge   Problem: Coping: Goal: Level of anxiety will decrease Outcome: Adequate for Discharge   Problem: Elimination: Goal: Will not experience complications related to bowel motility Outcome: Adequate for Discharge Goal: Will not experience complications related to urinary retention Outcome: Adequate for Discharge   Problem: Pain Managment: Goal: General experience of comfort will improve and/or be controlled Outcome: Adequate for Discharge   Problem: Safety: Goal: Ability to remain free from injury will improve Outcome: Adequate for Discharge   Problem: Skin Integrity: Goal: Risk for impaired skin integrity will decrease Outcome: Adequate for Discharge

## 2024-01-19 NOTE — Discharge Summary (Signed)
 Physician Discharge Summary  ENRRIQUE Mcguire FMW:[id number] DOB: [birth year] DOA: 01/15/2024  PCP: Irean Karolynn Batters, NP  Admit date: 01/15/2024  Discharge date: 01/19/2024  Discharge Diagnoses:  Principal Problem:   Sickle cell pain crisis (HCC) Active Problems:   Anemia of chronic disease   Leukocytosis   Chronic pain syndrome   Discharge Condition: Stable  Disposition:  Pt is discharged home in good condition and is to follow up with Dep, Karolynn Batters, NP this week to have labs evaluated. Manuel Mcguire is instructed to increase activity slowly and balance with rest for the next few days, and use prescribed medication to complete treatment of pain  Diet: Regular Wt Readings from Last 3 Encounters:  01/15/24 79.7 kg  01/14/24 75 kg  01/13/24 75 kg    History of present illness:  Manuel Mcguire is a 33 y.o. male with medical history significant for sickle cell disease, essential hypertension, post lap chole with intraoperative cholangiogram on 11/22/2023, who presents to the ER with back pain and abdominal pain, similar to his typical sickle cell pain crisis.  He also endorses nausea and vomiting and inability to keep his oral intake down, including his home analgesics.  Denies subjective fevers or chills, Headache, SOB. No urinary symptoms, no recent travels or sick contacts.     In the ER, the patient received multiple rounds of IV opiate-based analgesics with minimal effect.  Also received IV fluid and IV antiemetics. Patient was admitted for unresolved and ongoing sickle cell pain crisis management.  BP (!) 149/108 (BP Location: Right Arm)  Pulse 84  Temp 98.1 F (36.7 C) (Oral)  Resp 18  Wt 75 kg  SpO2 93%  BMI 25.90 kg/m  0142      NA    137  K    3.5  CL    105  CO2    23  GLUCOSE    146*  BUN    <5*  CREATININE    0.85  CALCIUM    9.2      Hospital Course:  Patient was admitted for sickle cell pain crisis and managed appropriately with IVF, IV  Dilaudid  via PCA and IV Toradol , as well as other adjunct therapies per sickle cell pain management protocols. Patient is reporting improved pain symptoms today back to baseline. He is tolerating PO without N/V. Ambulating without assistance.  Patient was therefore discharged home today in a hemodynamically stable condition.   Ibrahim will follow-up with PCP within 1 week of this discharge. Maeson was counseled extensively about nonpharmacologic means of pain management, patient verbalized understanding and was appreciative of  the care received during this admission.   We discussed the need for good hydration, monitoring of hydration status, avoidance of heat, cold, stress, and infection triggers. We discussed the need to be adherent with taking other home medications. Patient was reminded of the need to seek medical attention immediately if any symptom of bleeding, anemia, or infection occurs.  Discharge Exam: Vitals:   01/19/24 0944 01/19/24 0945  BP: (!) 148/89   Pulse: 75   Resp: 20 20  Temp: 97.8 F (36.6 C)   SpO2: 97%    Vitals:   01/19/24 0455 01/19/24 0749 01/19/24 0944 01/19/24 0945  BP:   (!) 148/89   Pulse:   75   Resp: 18 16 20 20   Temp:   97.8 F (36.6 C)   TempSrc:   Oral   SpO2: 99% 99% 97%   Weight:  Height:        General appearance : Awake, alert, not in any distress. Speech Clear. Not toxic looking HEENT: Atraumatic and Normocephalic, pupils equally reactive to light and accomodation Neck: Supple, no JVD. No cervical lymphadenopathy.  Chest: Good air entry bilaterally, no added sounds  CVS: S1 S2 regular, no murmurs.  Abdomen: Bowel sounds present, Non tender and not distended with no gaurding, rigidity or rebound. Extremities: B/L Lower Ext shows no edema, both legs are warm to touch Neurology: Awake alert, and oriented X 3, CN II-XII intact, Non focal Skin: No Rash  Discharge Instructions  Discharge Instructions     Call MD for:  severe  uncontrolled pain   Complete by: As directed    Call MD for:  temperature >100.4   Complete by: As directed    Diet - low sodium heart healthy   Complete by: As directed    Increase activity slowly   Complete by: As directed       Allergies as of 01/19/2024   No Known Allergies      Medication List     TAKE these medications    cholecalciferol  25 MCG (1000 UNIT) tablet Commonly known as: VITAMIN D3 Take 1,000 Units by mouth daily.   folic acid  1 MG tablet Commonly known as: FOLVITE  Take 1 tablet (1 mg total) by mouth daily.   hydroxyurea  500 MG capsule Commonly known as: HYDREA  Take 3 capsules (1,500 mg total) by mouth daily. May take with food to minimize GI side effects.   lisinopril  2.5 MG tablet Commonly known as: ZESTRIL  Take 2.5 mg by mouth daily.   metoCLOPramide  10 MG tablet Commonly known as: REGLAN  Take 1 tablet (10 mg total) by mouth 3 (three) times daily with meals. What changed:  when to take this reasons to take this   ondansetron  4 MG disintegrating tablet Commonly known as: ZOFRAN -ODT 4mg  ODT q4 hours prn nausea/vomit   oxyCODONE  5 MG immediate release tablet Commonly known as: Oxy IR/ROXICODONE  Take 1 tablet (5 mg total) by mouth every 6 (six) hours as needed for severe pain (pain score 7-10).   promethazine 12.5 MG tablet Commonly known as: PHENERGAN Take 12.5 mg by mouth every 8 (eight) hours as needed.   pseudoephedrine 120 MG 12 hr tablet Commonly known as: SUDAFED Take 120 mg by mouth every 12 (twelve) hours as needed for congestion.        The results of significant diagnostics from this hospitalization (including imaging, microbiology, ancillary and laboratory) are listed below for reference.    Significant Diagnostic Studies: CT ABDOMEN PELVIS W CONTRAST Result Date: 01/14/2024 CLINICAL DATA:  Acute abdominal pain, history of sickle cell disease EXAM: CT ABDOMEN AND PELVIS WITH CONTRAST TECHNIQUE: Multidetector CT imaging of  the abdomen and pelvis was performed using the standard protocol following bolus administration of intravenous contrast. RADIATION DOSE REDUCTION: This exam was performed according to the departmental dose-optimization program which includes automated exposure control, adjustment of the mA and/or kV according to patient size and/or use of iterative reconstruction technique. CONTRAST:  OMNIPAQUE  IOHEXOL  300 MG/ML  SOLN COMPARISON:  12/16/2023 FINDINGS: Lower chest: Lung bases are free of acute infiltrate or sizable effusion. Hepatobiliary: No focal liver abnormality is seen. Status post cholecystectomy. No biliary dilatation. Pancreas: Unremarkable. No pancreatic ductal dilatation or surrounding inflammatory changes. Spleen: Auto infarction of the spleen is noted. Adrenals/Urinary Tract: Adrenal glands are within normal limits. Kidneys demonstrate a normal enhancement pattern. No calculi are seen. No obstructive  changes are noted. The bladder is decompressed. Stomach/Bowel: The appendix is within normal limits. No obstructive or inflammatory changes of the colon are seen. Small bowel and stomach are unremarkable. Vascular/Lymphatic: No significant vascular findings are present. No enlarged abdominal or pelvic lymph nodes. Reproductive: Prostate is unremarkable. Other: No abdominal wall hernia or abnormality. No abdominopelvic ascites. Musculoskeletal: Postsurgical changes are noted in the proximal left hip. Generalized increased density is noted throughout the bony structures consistent with the given clinical history. Postsurgical changes are noted in the lower thoracic spine. No hardware failure is noted. IMPRESSION: Chronic changes consistent with the given clinical history. No acute abnormality noted. Electronically Signed   By: Oneil Devonshire M.D.   On: 01/14/2024 23:31   DG Chest Port 1 View Result Date: 01/14/2024 CLINICAL DATA:  Right upper quadrant abdominal pain EXAM: PORTABLE CHEST 1 VIEW COMPARISON:   Chest x-ray 12/16/2023 FINDINGS: The heart is enlarged. The lungs are clear. There is no pleural effusion or pneumothorax. Thoracic spinal fusion hardware is present. No acute fractures are seen. IMPRESSION: Cardiomegaly. No acute cardiopulmonary process. Electronically Signed   By: Greig Pique M.D.   On: 01/14/2024 22:28    Microbiology: No results found for this or any previous visit (from the past 240 hours).   Labs: Basic Metabolic Panel: Recent Labs  Lab 01/13/24 0629 01/14/24 2209 01/15/24 0142 01/16/24 0550  NA 142 142 137 139  K 3.8 3.8 3.5 3.4*  CL 111 108 105 105  CO2 21* 22 23 25   GLUCOSE 97 92 146* 101*  BUN 11 <5* <5* 7  CREATININE 0.89 0.78 0.85 0.73  CALCIUM 9.1 9.4 9.2 8.7*  MG  --   --   --  1.7  PHOS  --   --   --  4.5   Liver Function Tests: Recent Labs  Lab 01/13/24 0629 01/14/24 2209 01/15/24 0142  AST 31 35 32  ALT 12 12 14   ALKPHOS 97 101 89  BILITOT 1.4* 1.9* 2.3*  PROT 7.8 8.3* 8.5*  ALBUMIN 4.1 4.4 4.1   Recent Labs  Lab 01/14/24 2209 01/15/24 0142  LIPASE 30 33   No results for input(s): AMMONIA in the last 168 hours. CBC: Recent Labs  Lab 01/13/24 0629 01/14/24 2209 01/15/24 0142 01/16/24 0550 01/17/24 0944 01/18/24 0540 01/19/24 0442  WBC 13.7* 11.0* 12.1* 10.4 13.4* 14.6* 13.7*  NEUTROABS 9.3* 7.1 6.3  --   --   --   --   HGB 8.7* 9.2* 9.1* 7.8* 7.9* 8.4* 8.0*  HCT 23.2* 25.0* 25.8* 22.1* 22.2* 23.2* 22.4*  MCV 98.3 100.0 102.0* 104.2* 102.3* 103.1* 102.3*  PLT 208 227 219 175 221 225 205   Cardiac Enzymes: No results for input(s): CKTOTAL, CKMB, CKMBINDEX, TROPONINI in the last 168 hours. BNP: Invalid input(s): POCBNP CBG: No results for input(s): GLUCAP in the last 168 hours.  Time coordinating discharge: 50 minutes  Signed:  [name] Cover NP   01/19/2024, 3:01 PM

## 2024-01-21 ENCOUNTER — Other Ambulatory Visit: Payer: Self-pay

## 2024-01-21 ENCOUNTER — Encounter (HOSPITAL_BASED_OUTPATIENT_CLINIC_OR_DEPARTMENT_OTHER): Payer: Self-pay | Admitting: Emergency Medicine

## 2024-01-21 ENCOUNTER — Inpatient Hospital Stay (HOSPITAL_BASED_OUTPATIENT_CLINIC_OR_DEPARTMENT_OTHER)
Admission: EM | Admit: 2024-01-21 | Discharge: 2024-01-27 | DRG: 812 | Disposition: A | Discharge status: Home / Self Care | Attending: Internal Medicine | Admitting: Internal Medicine

## 2024-01-21 DIAGNOSIS — M419 Scoliosis, unspecified: Secondary | ICD-10-CM | POA: Diagnosis not present

## 2024-01-21 DIAGNOSIS — D72829 Elevated white blood cell count, unspecified: Secondary | ICD-10-CM | POA: Diagnosis not present

## 2024-01-21 DIAGNOSIS — Z8249 Family history of ischemic heart disease and other diseases of the circulatory system: Secondary | ICD-10-CM

## 2024-01-21 DIAGNOSIS — F1120 Opioid dependence, uncomplicated: Secondary | ICD-10-CM | POA: Diagnosis present

## 2024-01-21 DIAGNOSIS — Z832 Family history of diseases of the blood and blood-forming organs and certain disorders involving the immune mechanism: Secondary | ICD-10-CM | POA: Diagnosis not present

## 2024-01-21 DIAGNOSIS — I10 Essential (primary) hypertension: Secondary | ICD-10-CM | POA: Diagnosis not present

## 2024-01-21 DIAGNOSIS — D5700 Hb-SS disease with crisis, unspecified: Secondary | ICD-10-CM | POA: Diagnosis not present

## 2024-01-21 DIAGNOSIS — R112 Nausea with vomiting, unspecified: Secondary | ICD-10-CM | POA: Diagnosis not present

## 2024-01-21 DIAGNOSIS — Z79899 Other long term (current) drug therapy: Secondary | ICD-10-CM

## 2024-01-21 DIAGNOSIS — D638 Anemia in other chronic diseases classified elsewhere: Secondary | ICD-10-CM | POA: Diagnosis present

## 2024-01-21 DIAGNOSIS — G894 Chronic pain syndrome: Secondary | ICD-10-CM | POA: Diagnosis present

## 2024-01-21 DIAGNOSIS — Z833 Family history of diabetes mellitus: Secondary | ICD-10-CM

## 2024-01-21 DIAGNOSIS — R109 Unspecified abdominal pain: Secondary | ICD-10-CM | POA: Diagnosis present

## 2024-01-21 LAB — CBC WITH DIFFERENTIAL/PLATELET
Abs Immature Granulocytes: 0.04 K/uL (ref 0.00–0.07)
Basophils Absolute: 0.1 K/uL (ref 0.0–0.1)
Basophils Relative: 0 %
Eosinophils Absolute: 0.1 K/uL (ref 0.0–0.5)
Eosinophils Relative: 1 %
HCT: 26.6 % — ABNORMAL LOW (ref 39.0–52.0)
Hemoglobin: 9.6 g/dL — ABNORMAL LOW (ref 13.0–17.0)
Immature Granulocytes: 0 %
Lymphocytes Relative: 24 %
Lymphs Abs: 2.9 K/uL (ref 0.7–4.0)
MCH: 35.8 pg — ABNORMAL HIGH (ref 26.0–34.0)
MCHC: 36.1 g/dL — ABNORMAL HIGH (ref 30.0–36.0)
MCV: 99.3 fL (ref 80.0–100.0)
Monocytes Absolute: 1.7 K/uL — ABNORMAL HIGH (ref 0.1–1.0)
Monocytes Relative: 14 %
Neutro Abs: 7.4 K/uL (ref 1.7–7.7)
Neutrophils Relative %: 61 %
Platelets: 269 K/uL (ref 150–400)
RBC: 2.68 MIL/uL — ABNORMAL LOW (ref 4.22–5.81)
RDW: 20.1 % — ABNORMAL HIGH (ref 11.5–15.5)
WBC: 12.2 K/uL — ABNORMAL HIGH (ref 4.0–10.5)
nRBC: 0.7 % — ABNORMAL HIGH (ref 0.0–0.2)

## 2024-01-21 LAB — COMPREHENSIVE METABOLIC PANEL WITH GFR
ALT: 35 U/L (ref 0–44)
AST: 61 U/L — ABNORMAL HIGH (ref 15–41)
Albumin: 4.7 g/dL (ref 3.5–5.0)
Alkaline Phosphatase: 103 U/L (ref 38–126)
Anion gap: 14 (ref 5–15)
BUN: 6 mg/dL (ref 6–20)
CO2: 23 mmol/L (ref 22–32)
Calcium: 9.7 mg/dL (ref 8.9–10.3)
Chloride: 105 mmol/L (ref 98–111)
Creatinine, Ser: 0.88 mg/dL (ref 0.61–1.24)
GFR, Estimated: >60 mL/min (ref >60)
Glucose, Bld: 106 mg/dL — ABNORMAL HIGH (ref 70–99)
Potassium: 3.9 mmol/L (ref 3.5–5.1)
Sodium: 142 mmol/L (ref 135–145)
Total Bilirubin: 2.2 mg/dL — ABNORMAL HIGH (ref 0.0–1.2)
Total Protein: 8.9 g/dL — ABNORMAL HIGH (ref 6.5–8.1)

## 2024-01-21 LAB — RETICULOCYTES
Immature Retic Fract: 51.3 % — ABNORMAL HIGH (ref 2.3–15.9)
RBC.: 2.67 MIL/uL — ABNORMAL LOW (ref 4.22–5.81)
Retic Count, Absolute: 95.1 K/uL (ref 19.0–186.0)
Retic Ct Pct: 3.6 % — ABNORMAL HIGH (ref 0.4–3.1)

## 2024-01-21 LAB — LIPASE, BLOOD: Lipase: 28 U/L (ref 11–51)

## 2024-01-21 MED ORDER — ENOXAPARIN SODIUM 40 MG/0.4ML IJ SOSY
40.0000 mg | PREFILLED_SYRINGE | INTRAMUSCULAR | Status: DC
  Administered 2024-01-21 – 2024-01-26 (×6): 40 mg via SUBCUTANEOUS
  Filled 2024-01-21 (×6): qty 0.4

## 2024-01-21 MED ORDER — LISINOPRIL 5 MG PO TABS
2.5000 mg | ORAL_TABLET | Freq: Every day | ORAL | Status: DC
  Administered 2024-01-22 – 2024-01-27 (×6): 2.5 mg via ORAL
  Filled 2024-01-21 (×6): qty 1

## 2024-01-21 MED ORDER — HYDROMORPHONE HCL 1 MG/ML IJ SOLN
2.0000 mg | INTRAMUSCULAR | Status: AC
  Administered 2024-01-21: 2 mg via INTRAVENOUS
  Filled 2024-01-21: qty 2

## 2024-01-21 MED ORDER — SODIUM CHLORIDE 0.45 % IV SOLN: INTRAVENOUS | Status: AC

## 2024-01-21 MED ORDER — HYDROMORPHONE HCL 1 MG/ML IJ SOLN
1.0000 mg | INTRAMUSCULAR | Status: DC | PRN
  Administered 2024-01-21 (×3): 1 mg via INTRAVENOUS
  Filled 2024-01-21 (×3): qty 1

## 2024-01-21 MED ORDER — KETOROLAC TROMETHAMINE 15 MG/ML IJ SOLN
15.0000 mg | Freq: Four times a day (QID) | INTRAMUSCULAR | Status: AC
  Administered 2024-01-21 – 2024-01-26 (×20): 15 mg via INTRAVENOUS
  Filled 2024-01-21 (×20): qty 1

## 2024-01-21 MED ORDER — FOLIC ACID 1 MG PO TABS
1.0000 mg | ORAL_TABLET | Freq: Every day | ORAL | Status: DC
  Administered 2024-01-22 – 2024-01-27 (×6): 1 mg via ORAL
  Filled 2024-01-21 (×6): qty 1

## 2024-01-21 MED ORDER — SODIUM CHLORIDE 0.9% FLUSH: 9.0000 mL | INTRAVENOUS | Status: DC | PRN

## 2024-01-21 MED ORDER — DIPHENHYDRAMINE HCL 25 MG PO CAPS
25.0000 mg | ORAL_CAPSULE | Freq: Four times a day (QID) | ORAL | Status: DC | PRN
  Administered 2024-01-21 – 2024-01-25 (×6): 25 mg via ORAL
  Filled 2024-01-21 (×6): qty 1

## 2024-01-21 MED ORDER — SENNOSIDES-DOCUSATE SODIUM 8.6-50 MG PO TABS
1.0000 | ORAL_TABLET | Freq: Two times a day (BID) | ORAL | Status: DC
  Administered 2024-01-21 – 2024-01-27 (×13): 1 via ORAL
  Filled 2024-01-21 (×13): qty 1

## 2024-01-21 MED ORDER — DIPHENHYDRAMINE HCL 25 MG PO CAPS: 25.0000 mg | ORAL_CAPSULE | ORAL | Status: DC | PRN

## 2024-01-21 MED ORDER — ACETAMINOPHEN 325 MG PO TABS
650.0000 mg | ORAL_TABLET | ORAL | Status: DC | PRN
  Administered 2024-01-21: 650 mg via ORAL
  Filled 2024-01-21: qty 2

## 2024-01-21 MED ORDER — OXYCODONE HCL 5 MG PO TABS
5.0000 mg | ORAL_TABLET | Freq: Four times a day (QID) | ORAL | Status: DC | PRN
  Administered 2024-01-21 – 2024-01-27 (×20): 5 mg via ORAL
  Filled 2024-01-21 (×22): qty 1

## 2024-01-21 MED ORDER — HYDROXYUREA 500 MG PO CAPS
1500.0000 mg | ORAL_CAPSULE | Freq: Every day | ORAL | Status: DC
  Administered 2024-01-21 – 2024-01-27 (×7): 1500 mg via ORAL
  Filled 2024-01-21 (×7): qty 3

## 2024-01-21 MED ORDER — POLYETHYLENE GLYCOL 3350 17 G PO PACK
17.0000 g | PACK | Freq: Every day | ORAL | Status: DC | PRN
  Administered 2024-01-24 – 2024-01-26 (×3): 17 g via ORAL
  Filled 2024-01-21 (×3): qty 1

## 2024-01-21 MED ORDER — VITAMIN D 25 MCG (1000 UNIT) PO TABS
1000.0000 [IU] | ORAL_TABLET | Freq: Every day | ORAL | Status: DC
Start: 2024-01-22 — End: 2024-01-27
  Administered 2024-01-22 – 2024-01-27 (×6): 1000 [IU] via ORAL
  Filled 2024-01-21 (×6): qty 1

## 2024-01-21 MED ORDER — HYDROMORPHONE 1 MG/ML IV SOLN
INTRAVENOUS | Status: DC
  Administered 2024-01-21: 30 mg via INTRAVENOUS
  Administered 2024-01-22: 6.6 mg via INTRAVENOUS
  Filled 2024-01-21: qty 30

## 2024-01-21 MED ORDER — ONDANSETRON HCL 4 MG/2ML IJ SOLN
4.0000 mg | INTRAMUSCULAR | Status: DC | PRN
  Administered 2024-01-21 – 2024-01-27 (×7): 4 mg via INTRAVENOUS
  Filled 2024-01-21 (×7): qty 2

## 2024-01-21 MED ORDER — KETOROLAC TROMETHAMINE 15 MG/ML IJ SOLN
15.0000 mg | INTRAMUSCULAR | Status: AC
  Administered 2024-01-21: 15 mg via INTRAVENOUS
  Filled 2024-01-21: qty 1

## 2024-01-21 MED ORDER — NALOXONE HCL 0.4 MG/ML IJ SOLN: 0.4000 mg | INTRAMUSCULAR | Status: DC | PRN

## 2024-01-21 NOTE — Plan of Care (Signed)

## 2024-01-21 NOTE — ED Notes (Signed)
 Patient alert and oriented, family at bedside. Offered food. Provided cereal and drink per request. No further needs at this time. Awaiting transportation to hospital for admission.

## 2024-01-21 NOTE — ED Triage Notes (Signed)
 Pt c/o SCC and vomiting since yesterday with pain in legs. Tried home pain meds without relief.

## 2024-01-21 NOTE — Progress Notes (Signed)
 Plan of Care Note for accepted transfer   Patient: Manuel Mcguire MRN: [id number]   DOA: 01/21/2024  Facility requesting transfer: [facility]   Requesting Provider: Dr. Darra   Reason for transfer: Sickle cell pain crisis   Facility course: 33 yr old man with HTN and sickle cell disease presents with bilateral LE pain similar to prior pain crises.   He is afebrile. Hgb appears close to baseline.   He continues to complain of pain after IVF, Toradol , and multiple doses of IV Dilaudid .   Plan of care: The patient is accepted for admission to Med-surg  unit, at [facility].   Author: Evalene GORMAN Sprinkles, MD 01/21/2024  Check [email or web address] for on-call coverage.  Nursing staff, Please call [facility] Admits & Consults System-Wide number on Amion as soon as patient's arrival, so appropriate admitting provider can evaluate the pt.

## 2024-01-21 NOTE — ED Notes (Signed)
 Pt well hydrated with PO fluid request, same was okayed by EDP.  Pt in bed with eyes shut and head phones on.

## 2024-01-21 NOTE — ED Notes (Signed)
[facility] at bedside for transport. 

## 2024-01-21 NOTE — H&P (Signed)
 H&P  Patient Demographics:  Manuel Mcguire, is a 33 y.o. male  MRN: [id number]   DOB - Mar 25, 1991  Admit Date - 01/21/2024  Outpatient Primary MD for the patient is Dep, Karolynn Batters, NP  Chief Complaint  Patient presents with   Sickle Cell Pain Crisis      HPI:   Manuel Mcguire  is a 33 y.o. male with a medical history significant for sickle cell disease, essential hypertension, status post lap chole with intraoperative cholangiogram on 11/22/2023, and anemia of chronic disease presented to the emergency department with pain primarily to abdomen and lower extremities that has been worsening since hospital discharge on 01/19/2024. Patient states that on yesterday, he began to vomit shortly after awakening.  Patient states that previous sickle cell crisis started in the same fashion.  He has been unable to tolerate food or drinks.  He cannot tolerate his home medication regimen.  He last had oxycodone  yesterday without very much relief.  He states that he was taking promethazine at home for nausea without improvement. He states that he has not had vomiting since hospital admission.  Vomiting improved with IV antiemetics his pain intensity is currently 7/10 primarily to his lower extremities. He currently denies fever, chills, chest pain, shortness of breath, urinary symptoms, nausea, vomiting, or diarrhea.  Patient was accepted from [facility] for admission.  While in the ER, he received several rounds of IV Dilaudid , IV Zofran , and IV fluids.  Patient admitted to [facility] for further management of his sickle cell pain crisis.   Review of systems:  Review of Systems  Constitutional:  Negative for chills and fever.  HENT: Negative.    Respiratory: Negative.  Negative for cough and sputum production.   Gastrointestinal:  Negative for abdominal pain, nausea and vomiting.  Genitourinary: Negative.   Musculoskeletal:  Positive for joint pain and myalgias.  Skin: Negative.    Neurological: Negative.   Endo/Heme/Allergies: Negative.   Psychiatric/Behavioral: Negative.      With Past History of the following :   Past Medical History:  Diagnosis Date   Scoliosis    Sickle cell anemia with pain (HCC)       Past Surgical History:  Procedure Laterality Date   BACK SURGERY     unaware of particular surgery   CHOLECYSTECTOMY N/A 11/22/2023   Procedure: LAPAROSCOPIC CHOLECYSTECTOMY WITH INTRAOPERATIVE CHOLANGIOGRAM;  Surgeon: Signe Mitzie LABOR, MD;  Location: [facility] ORS;  Service: General;  Laterality: N/A;  Poss IOC, ICG   HIP PINNING Left    Pt unaware of particulars     Social History:   Social History   Tobacco Use   Smoking status: Never   Smokeless tobacco: Not on file  Substance Use Topics   Alcohol use: No     Lives - At home   Family History :   Family History  Problem Relation Age of Onset   Diabetes Mother    Hypertension Mother    Sickle cell anemia Sister      Home Medications:   Prior to Admission medications   Medication Sig Start Date End Date Taking? Authorizing Provider  cholecalciferol  (VITAMIN D3) 25 MCG (1000 UNIT) tablet Take 1,000 Units by mouth daily.    [provider]  folic acid  (FOLVITE ) 1 MG tablet Take 1 tablet (1 mg total) by mouth daily. 10/27/12   Alvia Rosaline LABOR, MD  hydroxyurea  (HYDREA ) 500 MG capsule Take 3 capsules (1,500 mg total) by mouth daily. May take  with food to minimize GI side effects. 10/27/12   Alvia Rosaline LABOR, MD  lisinopril  (ZESTRIL ) 2.5 MG tablet Take 2.5 mg by mouth daily.    [provider]  metoCLOPramide  (REGLAN ) 10 MG tablet Take 1 tablet (10 mg total) by mouth 3 (three) times daily with meals. Patient taking differently: Take 10 mg by mouth 3 (three) times daily as needed for nausea or vomiting. 12/02/23 12/01/24  Cherylene Homer HERO, NP  ondansetron  (ZOFRAN -ODT) 4 MG disintegrating tablet 4mg  ODT q4 hours prn nausea/vomit Patient not taking: Reported on 01/15/2024  11/06/23   Patt Alm Macho, MD  oxyCODONE  (OXY IR/ROXICODONE ) 5 MG immediate release tablet Take 1 tablet (5 mg total) by mouth every 6 (six) hours as needed for severe pain (pain score 7-10). 12/02/23   Ijaola, Onyeje M, NP  promethazine (PHENERGAN) 12.5 MG tablet Take 12.5 mg by mouth every 8 (eight) hours as needed. 12/27/23   [provider]  pseudoephedrine (SUDAFED) 120 MG 12 hr tablet Take 120 mg by mouth every 12 (twelve) hours as needed for congestion.    [provider]     Allergies:   No Known Allergies   Physical Exam:   Vitals:   Vitals:   01/21/24 0800 01/21/24 1008  BP: 114/85 130/89  Pulse: 85 78  Resp: 16 19  Temp:  97.8 F (36.6 C)  SpO2: 96% 98%    Physical Exam: Constitutional: Patient appears well-developed and well-nourished. Not in obvious distress. HENT: Normocephalic, atraumatic, External right and left ear normal. Oropharynx is clear and moist.  Eyes: Conjunctivae and EOM are normal. PERRLA, no scleral icterus. Neck: Normal ROM. Neck supple. No JVD. No tracheal deviation. No thyromegaly. CVS: RRR, S1/S2 +, no murmurs, no gallops, no carotid bruit.  Pulmonary: Effort and breath sounds normal, no stridor, rhonchi, wheezes, rales.  Abdominal: Soft. BS +, no distension, tenderness, rebound or guarding.  Musculoskeletal: Normal range of motion. No edema and no tenderness.  Lymphadenopathy: No lymphadenopathy noted, cervical, inguinal or axillary Neuro: Alert. Normal reflexes, muscle tone coordination. No cranial nerve deficit. Skin: Skin is warm and dry. No rash noted. Not diaphoretic. No erythema. No pallor. Psychiatric: Normal mood and affect. Behavior, judgment, thought content normal.   Data Review:   CBC Recent Labs  Lab 01/14/24 2209 01/15/24 0142 01/16/24 0550 01/17/24 0944 01/18/24 0540 01/19/24 0442 01/21/24 0128  WBC 11.0* 12.1* 10.4 13.4* 14.6* 13.7* 12.2*  HGB 9.2* 9.1* 7.8* 7.9* 8.4* 8.0* 9.6*  HCT 25.0* 25.8*  22.1* 22.2* 23.2* 22.4* 26.6*  PLT 227 219 175 221 225 205 269  MCV 100.0 102.0* 104.2* 102.3* 103.1* 102.3* 99.3  MCH 36.8* 36.0* 36.8* 36.4* 37.3* 36.5* 35.8*  MCHC 36.8* 35.3 35.3 35.6 36.2* 35.7 36.1*  RDW 20.6* 20.7* 21.1* 20.9* 21.5* 20.4* 20.1*  LYMPHSABS 2.6 4.5*  --   --   --   --  2.9  MONOABS 1.1* 1.1*  --   --   --   --  1.7*  EOSABS 0.1 0.1  --   --   --   --  0.1  BASOSABS 0.1 0.1  --   --   --   --  0.1   ------------------------------------------------------------------------------------------------------------------  Chemistries  Recent Labs  Lab 01/14/24 2209 01/15/24 0142 01/16/24 0550 01/21/24 0128  NA 142 137 139 142  K 3.8 3.5 3.4* 3.9  CL 108 105 105 105  CO2 22 23 25 23   GLUCOSE 92 146* 101* 106*  BUN <5* <5* 7  6  CREATININE 0.78 0.85 0.73 0.88  CALCIUM 9.4 9.2 8.7* 9.7  MG  --   --  1.7  --   AST 35 32  --  61*  ALT 12 14  --  35  ALKPHOS 101 89  --  103  BILITOT 1.9* 2.3*  --  2.2*   ------------------------------------------------------------------------------------------------------------------ estimated creatinine clearance is 119.6 mL/min (by C-G formula based on SCr of 0.88 mg/dL). ------------------------------------------------------------------------------------------------------------------ No results for input(s): TSH, T4TOTAL, T3FREE, THYROIDAB in the last 72 hours.  Invalid input(s): FREET3  Coagulation profile No results for input(s): INR, PROTIME in the last 168 hours. ------------------------------------------------------------------------------------------------------------------- No results for input(s): DDIMER in the last 72 hours. -------------------------------------------------------------------------------------------------------------------  Cardiac Enzymes No results for input(s): CKMB, TROPONINI, MYOGLOBIN in the last 168 hours.  Invalid input(s):  CK ------------------------------------------------------------------------------------------------------------------ No results found for: BNP  ---------------------------------------------------------------------------------------------------------------  Urinalysis    Component Value Date/Time   COLORURINE YELLOW 01/14/2024 2228   APPEARANCEUR CLEAR 01/14/2024 2228   LABSPEC 1.020 01/14/2024 2228   PHURINE 5.5 01/14/2024 2228   GLUCOSEU NEGATIVE 01/14/2024 2228   HGBUR SMALL (A) 01/14/2024 2228   BILIRUBINUR NEGATIVE 01/14/2024 2228   KETONESUR NEGATIVE 01/14/2024 2228   PROTEINUR 100 (A) 01/14/2024 2228   UROBILINOGEN 1.0 10/27/2010 0630   NITRITE NEGATIVE 01/14/2024 2228   LEUKOCYTESUR NEGATIVE 01/14/2024 2228    ----------------------------------------------------------------------------------------------------------------   Imaging Results:    No results found.   Assessment & Plan:  Principal Problem:   Sickle cell pain crisis (HCC) Active Problems:   Anemia of chronic disease   Leukocytosis   Chronic pain syndrome   Abdominal pain  Sickle cell disease with pain crisis: Pain persists, initiate IV Dilaudid  PCA per full dose Decrease IV fluids to 0.45% saline at 50 mL/h Hold oxycodone , will restart as pain intensity improves Monitor vital signs very closely, reevaluate pain scale regularly, and supplemental needed.  Anemia of chronic disease: Patient's hemoglobin is stable and consistent with his baseline.  There is no clinical indication for blood transfusion at this time.  Continue to monitor closely.  Leukocytosis: Very mild leukocytosis, consistent with patient's baseline.  He is currently afebrile.  No signs of acute infection. Will review labs in a.m. as they become available.  Essential hypertension: Stable.  Continue home medication regimen.  Hyperemesis: Vomiting has subsided since hospital admission.  Will continue antiemetics as needed.   Advance diet as tolerated.  DVT Prophylaxis: Subcut Lovenox    AM Labs Ordered, also please review Full Orders  Family Communication: Admission, patient's condition and plan of care including tests being ordered have been discussed with the patient who indicate understanding and agree with the plan and Code Status.  Code Status: Full Code  Consults called: None    Admission status: Inpatient    Time spent in minutes : 50 minutes  Bertram Georgina Bald  DNP, APRN, FNP-C Patient Care [facility] [address] [place], [place] [address] [phone]  01/21/2024 at 10:29 AM

## 2024-01-21 NOTE — ED Notes (Signed)
 [facility] called for transport.

## 2024-01-21 NOTE — ED Provider Notes (Signed)
 Emergency Department Provider Note   I have reviewed the triage vital signs and the nursing notes.   HISTORY  Chief Complaint Sickle Cell Pain Crisis   HPI Manuel Mcguire is a 33 y.o. male past history of sickle cell presents to the emergency department with leg pain consistent with his crisis discomfort along with nausea and vomiting.  Denies severe abdominal pain, fevers, chest pain, shortness of breath.  Pain is in the bilateral legs and feels typical of his crisis.  He was unable to keep down any of his pain medications at home.    Past Medical History:  Diagnosis Date   Scoliosis    Sickle cell anemia with pain (HCC)     Review of Systems  Constitutional: No fever/chills Cardiovascular: Denies chest pain. Respiratory: Denies shortness of breath. Gastrointestinal: No abdominal pain.  Positive nausea and vomiting.   Skin: Negative for rash. Neurological: Negative for headaches.  ____________________________________________   PHYSICAL EXAM:  VITAL SIGNS: ED Triage Vitals  Encounter Vitals Group     BP 01/21/24 0115 (!) 146/89     Pulse Rate 01/21/24 0115 (!) 107     Resp 01/21/24 0115 20     Temp 01/21/24 0115 98.1 F (36.7 C)     Temp src --      SpO2 01/21/24 0115 98 %     Weight 01/21/24 0114 175 lb 11.3 oz (79.7 kg)     Height 01/21/24 0114 5' 7 (1.702 m)   Constitutional: Alert and oriented. Well appearing and in no acute distress. Eyes: Conjunctivae are normal.  Head: Atraumatic. Nose: No congestion/rhinnorhea. Mouth/Throat: Mucous membranes are moist.   Neck: No stridor.   Cardiovascular: Normal rate, regular rhythm. Good peripheral circulation. Grossly normal heart sounds.   Respiratory: Normal respiratory effort.  No retractions. Lungs CTAB. Gastrointestinal: Soft and nontender. No distention.  Musculoskeletal: No lower extremity tenderness nor edema. No gross deformities of extremities. Neurologic:  Normal speech and language. No gross  focal neurologic deficits are appreciated.  Skin:  Skin is warm, dry and intact. No rash noted.  ____________________________________________   LABS (all labs ordered are listed, but only abnormal results are displayed)  Labs Reviewed  CBC WITH DIFFERENTIAL/PLATELET - Abnormal; Notable for the following components:      Result Value   WBC 12.2 (*)    RBC 2.68 (*)    Hemoglobin 9.6 (*)    HCT 26.6 (*)    MCH 35.8 (*)    MCHC 36.1 (*)    RDW 20.1 (*)    nRBC 0.7 (*)    Monocytes Absolute 1.7 (*)    All other components within normal limits  COMPREHENSIVE METABOLIC PANEL WITH GFR - Abnormal; Notable for the following components:   Glucose, Bld 106 (*)    Total Protein 8.9 (*)    AST 61 (*)    Total Bilirubin 2.2 (*)    All other components within normal limits  RETICULOCYTES - Abnormal; Notable for the following components:   Retic Ct Pct 3.6 (*)    RBC. 2.67 (*)    Immature Retic Fract 51.3 (*)    All other components within normal limits  LIPASE, BLOOD    ___________________________________________   PROCEDURES  Procedure(s) performed:   Procedures  CRITICAL CARE Performed by: Fonda KANDICE Law Total critical care time: 35 minutes Critical care time was exclusive of separately billable procedures and treating other patients. Critical care was necessary to treat or prevent imminent or life-threatening deterioration.  Critical care was time spent personally by me on the following activities: development of treatment plan with patient and/or surrogate as well as nursing, discussions with consultants, evaluation of patient's response to treatment, examination of patient, obtaining history from patient or surrogate, ordering and performing treatments and interventions, ordering and review of laboratory studies, ordering and review of radiographic studies, pulse oximetry and re-evaluation of patient's condition.  Fonda Law, MD Emergency  Medicine  ____________________________________________   INITIAL IMPRESSION / ASSESSMENT AND PLAN / ED COURSE  Pertinent labs & imaging results that were available during my care of the patient were reviewed by me and considered in my medical decision making (see chart for details).   This patient is Presenting for Evaluation of SS pain, which does require a range of treatment options, and is a complaint that involves a high risk of morbidity and mortality.  The Differential Diagnoses include SSC, chronic pain, AVN, etc.  Critical Interventions-    Medications  0.45 % sodium chloride  infusion ( Intravenous New Bag/Given 01/21/24 0141)  diphenhydrAMINE  (BENADRYL ) capsule 25-50 mg (has no administration in time range)  ondansetron  (ZOFRAN ) injection 4 mg (4 mg Intravenous Given 01/21/24 0135)  HYDROmorphone  (DILAUDID ) injection 1 mg (has no administration in time range)  ketorolac  (TORADOL ) 15 MG/ML injection 15 mg (15 mg Intravenous Given 01/21/24 0137)  HYDROmorphone  (DILAUDID ) injection 2 mg (2 mg Intravenous Given 01/21/24 0137)  HYDROmorphone  (DILAUDID ) injection 2 mg (2 mg Intravenous Given 01/21/24 0245)  HYDROmorphone  (DILAUDID ) injection 2 mg (2 mg Intravenous Given 01/21/24 0319)    Reassessment after intervention: pain  mildly improved.   Clinical Laboratory Tests Ordered, included CBC with mild leukocytosis 12.2.  Hemoglobin 9.6 above average for patient.  Absolute reticulocyte count normal.  Cardiac Monitor Tracing which shows NSR.    Social Determinants of Health Risk patient is a non-smoker.   Consult complete with [facility], Dr. Charlton. Plan for admit.   Medical Decision Making: Summary:  The patient presents emergency department with pain in his legs similar to prior sickle cell crisis pain.  Has had nausea/vomiting making it difficult to keep down his home meds. Plan for IVF and pain meds.   Reevaluation with update and discussion with patient.  He has had multiple rounds of  Dilaudid  along with Toradol , Zofran , fluids.  Labs are reassuring.  He is not feeling much better and feels he may need to be admitted for further pain control.  Patient's presentation is most consistent with acute presentation with potential threat to life or bodily function.   Disposition: discharge  ____________________________________________  FINAL CLINICAL IMPRESSION(S) / ED DIAGNOSES  Final diagnoses:  Sickle cell pain crisis (HCC)    Note:  This document was prepared using Dragon voice recognition software and may include unintentional dictation errors.  Fonda Law, MD, [facility] Emergency Medicine    Rudi Knippenberg, Fonda MATSU, MD 01/21/24 [phone]

## 2024-01-21 NOTE — Progress Notes (Signed)
   01/21/24 1043  TOC Brief Assessment  Insurance and Status Reviewed  Patient has primary care physician Yes  Home environment has been reviewed home w/ family  Prior level of function: independent  Prior/Current Home Services No current home services  Social Drivers of Health Review SDOH reviewed no interventions necessary  Readmission risk has been reviewed Yes  Transition of care needs no transition of care needs at this time

## 2024-01-22 DIAGNOSIS — D5700 Hb-SS disease with crisis, unspecified: Secondary | ICD-10-CM | POA: Diagnosis not present

## 2024-01-22 MED ORDER — HYDROMORPHONE 1 MG/ML IV SOLN
INTRAVENOUS | Status: DC
  Administered 2024-01-22: 9.6 mg via INTRAVENOUS
  Administered 2024-01-22: 2.5 mg via INTRAVENOUS
  Administered 2024-01-23: 6.5 mg via INTRAVENOUS
  Administered 2024-01-23: 5 mg via INTRAVENOUS
  Administered 2024-01-23: 30 mg via INTRAVENOUS
  Administered 2024-01-23: 5 mg via INTRAVENOUS
  Administered 2024-01-23: 7 mg via INTRAVENOUS
  Administered 2024-01-24: 6 mg via INTRAVENOUS
  Administered 2024-01-24: 6.29 mg via INTRAVENOUS
  Administered 2024-01-24: 4.5 mg via INTRAVENOUS
  Administered 2024-01-24: 1 mg via INTRAVENOUS
  Administered 2024-01-24: 1.5 mg via INTRAVENOUS
  Administered 2024-01-24: 30 mg via INTRAVENOUS
  Administered 2024-01-24 – 2024-01-25 (×3): 5.5 mg via INTRAVENOUS
  Administered 2024-01-25: 1 mL via INTRAVENOUS
  Administered 2024-01-25: 30 mg via INTRAVENOUS
  Administered 2024-01-25: 3 mg via INTRAVENOUS
  Administered 2024-01-25: 5.5 mg via INTRAVENOUS
  Administered 2024-01-26: 4 mg via INTRAVENOUS
  Administered 2024-01-26: 1 mg via INTRAVENOUS
  Administered 2024-01-26: 4 mg via INTRAVENOUS
  Administered 2024-01-26: 30 mg via INTRAVENOUS
  Filled 2024-01-22 (×5): qty 30

## 2024-01-22 MED ORDER — NALOXONE HCL 0.4 MG/ML IJ SOLN: 0.4000 mg | INTRAMUSCULAR | Status: DC | PRN

## 2024-01-22 MED ORDER — SODIUM CHLORIDE 0.9 % IV SOLN: INTRAVENOUS | Status: DC | PRN

## 2024-01-22 MED ORDER — SODIUM CHLORIDE 0.9% FLUSH: 9.0000 mL | INTRAVENOUS | Status: DC | PRN

## 2024-01-22 NOTE — Plan of Care (Signed)
 ?  Problem: Clinical Measurements: ?Goal: Will remain free from infection ?Outcome: Progressing ?  ?

## 2024-01-22 NOTE — Progress Notes (Cosign Needed Addendum)
 Subjective: Manuel Mcguire is a 33 year old male with a medical history significant for sickle cell disease, chronic pain syndrome, and opiate dependence and tolerance that was admitted for sickle cell pain crisis. Today, patient continues to complain of pain primarily to his low back and lower extremities.  Pain intensity is 6/10.  Patient continues to endorse some nausea.  He has not had any vomiting overnight. He denies any chest pain, shortness of breath, urinary symptoms, diarrhea, or constipation.  Objective:  Vital signs in last 24 hours:  Vitals:   01/22/24 0504 01/22/24 0831 01/22/24 1044 01/22/24 1047  BP: 133/82  (!) 141/96   Pulse: 77  88   Resp: 17 18 18 16   Temp: 98.1 F (36.7 C)  98.5 F (36.9 C)   TempSrc: Oral  Oral   SpO2: 97%  98%   Weight:      Height:        Intake/Output from previous day:   Intake/Output Summary (Last 24 hours) at 01/22/2024 1212 Last data filed at 01/22/2024 0900 Gross per 24 hour  Intake 480 ml  Output 1450 ml  Net -970 ml    Physical Exam: General: Alert, awake, oriented x3, in no acute distress.  HEENT: [place]/AT [place]ER[place], EOMI Neck: Trachea midline,  no masses, no thyromegal,y no JVD, no carotid bruit OROPHARYNX:  Moist, No exudate/ erythema/lesions.  Heart: Regular rate and rhythm, without murmurs, rubs, gallops, PMI non-displaced, no heaves or thrills on palpation.  Lungs: Clear to auscultation, no wheezing or rhonchi noted. No increased vocal fremitus resonant to percussion  Abdomen: Soft, nontender, nondistended, positive bowel sounds, no masses no hepatosplenomegaly noted..  Neuro: No focal neurological deficits noted cranial nerves II through XII grossly intact. DTRs 2+ bilaterally upper and lower extremities. Strength 5 out of 5 in bilateral upper and lower extremities. Musculoskeletal: No warm swelling or erythema around joints, no spinal tenderness noted. Psychiatric: Patient alert and oriented x3, good insight and cognition,  good recent to remote recall. Lymph node survey: No cervical axillary or inguinal lymphadenopathy noted.  Lab Results:  Basic Metabolic Panel:    Component Value Date/Time   NA 142 01/21/2024 0128   K 3.9 01/21/2024 0128   CL 105 01/21/2024 0128   CO2 23 01/21/2024 0128   BUN 6 01/21/2024 0128   CREATININE 0.88 01/21/2024 0128   GLUCOSE 106 (H) 01/21/2024 0128   CALCIUM 9.7 01/21/2024 0128   CBC:    Component Value Date/Time   WBC 12.2 (H) 01/21/2024 0128   HGB 9.6 (L) 01/21/2024 0128   HCT 26.6 (L) 01/21/2024 0128   PLT 269 01/21/2024 0128   MCV 99.3 01/21/2024 0128   NEUTROABS 7.4 01/21/2024 0128   LYMPHSABS 2.9 01/21/2024 0128   MONOABS 1.7 (H) 01/21/2024 0128   EOSABS 0.1 01/21/2024 0128   BASOSABS 0.1 01/21/2024 0128    No results found for this or any previous visit (from the past 240 hours).  Studies/Results: No results found.  Medications: Scheduled Meds:  cholecalciferol   1,000 Units Oral Daily   enoxaparin  (LOVENOX ) injection  40 mg Subcutaneous Q24H   folic acid   1 mg Oral Daily   HYDROmorphone    Intravenous Q4H   hydroxyurea   1,500 mg Oral Daily   ketorolac   15 mg Intravenous Q6H   lisinopril   2.5 mg Oral Daily   senna-docusate  1 tablet Oral BID   Continuous Infusions: PRN Meds:.acetaminophen , diphenhydrAMINE , naloxone  **AND** sodium chloride  flush, ondansetron , oxyCODONE , polyethylene glycol  Consultants: none  Procedures: none  Antibiotics: none  Assessment/Plan: Principal Problem:   Sickle cell pain crisis (HCC) Active Problems:   Anemia of chronic disease   Leukocytosis   Chronic pain syndrome   Abdominal pain   Sickle cell disease with pain crisis: Continue IV Dilaudid  PCA with settings of 0.5 mg, 10-minute lockout, and 2 mg/h. IV fluids at [facility] Oxycodone  5 mg every 4 hours as needed for moderate to severe breakthrough pain Monitor vital signs very closely, reevaluate pain scale regularly, and supplemental oxygen as  needed.  Anemia of chronic disease: Hemoglobin is stable and consistent with patient's baseline.  No clinical indication for blood transfusion at this time.  Monitor closely.  Labs in AM. Leukocytosis: Very mild leukocytosis that is consistent with patient's baseline.  Patient afebrile.  No signs of acute infection.  Follow labs.  Essential hypertension: Stable.  Continue home medication.  Hyperemesis: Resolved.  Continue antiemetics as needed.  Advance diet as tolerated.  Code Status: Full Code Family Communication: N/A Disposition Plan: Not yet ready for discharge  Bertram Georgina Bald  DNP, APRN, FNP-C [facility] [address] [place], [place] [address] [phone]  If 7PM-7AM, please contact night-coverage.  01/22/2024, 12:12 PM  LOS: 1 day

## 2024-01-23 NOTE — Plan of Care (Signed)
  Problem: Safety: Goal: Ability to remain free from injury will improve Outcome: Progressing   Problem: Pain Managment: Goal: General experience of comfort will improve and/or be controlled Outcome: Progressing   Problem: Elimination: Goal: Will not experience complications related to urinary retention Outcome: Progressing

## 2024-01-23 NOTE — Progress Notes (Signed)
 Subjective: Manuel Mcguire is a 33 year old male with a medical history significant for sickle cell disease, chronic pain syndrome, and opiate dependence and tolerance that was admitted for sickle cell pain crisis.  Today, patient continues to complain of pain to low back and lower extremities, he reports that his abdominal pain has revolved however he still has lingering nausea.  Pain intensity is 6/10.  He has not had any vomiting overnight. He denies any chest pain, shortness of breath, urinary symptoms, diarrhea, or constipation.  Objective:  Vital signs in last 24 hours:  Vitals:   01/23/24 0800 01/23/24 0949 01/23/24 1135 01/23/24 1318  BP:  (!) 132/92  124/89  Pulse:  83  63  Resp: 16 18 18 18   Temp:  98 F (36.7 C)  98.2 F (36.8 C)  TempSrc:  Oral  Oral  SpO2: 97% 98% 98% 100%  Weight:      Height:        Intake/Output from previous day:   Intake/Output Summary (Last 24 hours) at 01/23/2024 1433 Last data filed at 01/23/2024 1020 Gross per 24 hour  Intake 869 ml  Output 1500 ml  Net -631 ml    Physical Exam: General: Alert, awake, oriented x3, in no acute distress.  HEENT: [place]/AT [place]ER[place], EOMI Neck: Trachea midline,  no masses, no thyromegal,y no JVD, no carotid bruit OROPHARYNX:  Moist, No exudate/ erythema/lesions.  Heart: Regular rate and rhythm, without murmurs, rubs, gallops, PMI non-displaced, no heaves or thrills on palpation.  Lungs: Clear to auscultation, no wheezing or rhonchi noted. No increased vocal fremitus resonant to percussion  Abdomen: Soft, nontender, nondistended, positive bowel sounds, no masses no hepatosplenomegaly noted..  Neuro: No focal neurological deficits noted cranial nerves II through XII grossly intact. DTRs 2+ bilaterally upper and lower extremities. Strength 5 out of 5 in bilateral upper and lower extremities. Musculoskeletal: No warm swelling or erythema around joints, no spinal tenderness noted. Psychiatric: Patient alert and  oriented x3, good insight and cognition, good recent to remote recall. Lymph node survey: No cervical axillary or inguinal lymphadenopathy noted.  Lab Results:  Basic Metabolic Panel:    Component Value Date/Time   NA 142 01/21/2024 0128   K 3.9 01/21/2024 0128   CL 105 01/21/2024 0128   CO2 23 01/21/2024 0128   BUN 6 01/21/2024 0128   CREATININE 0.88 01/21/2024 0128   GLUCOSE 106 (H) 01/21/2024 0128   CALCIUM 9.7 01/21/2024 0128   CBC:    Component Value Date/Time   WBC 12.2 (H) 01/21/2024 0128   HGB 9.6 (L) 01/21/2024 0128   HCT 26.6 (L) 01/21/2024 0128   PLT 269 01/21/2024 0128   MCV 99.3 01/21/2024 0128   NEUTROABS 7.4 01/21/2024 0128   LYMPHSABS 2.9 01/21/2024 0128   MONOABS 1.7 (H) 01/21/2024 0128   EOSABS 0.1 01/21/2024 0128   BASOSABS 0.1 01/21/2024 0128    No results found for this or any previous visit (from the past 240 hours).  Studies/Results: No results found.  Medications: Scheduled Meds:  cholecalciferol   1,000 Units Oral Daily   enoxaparin  (LOVENOX ) injection  40 mg Subcutaneous Q24H   folic acid   1 mg Oral Daily   HYDROmorphone    Intravenous Q4H   hydroxyurea   1,500 mg Oral Daily   ketorolac   15 mg Intravenous Q6H   lisinopril   2.5 mg Oral Daily   senna-docusate  1 tablet Oral BID   Continuous Infusions:  sodium chloride  20 mL/hr at 01/22/24 1812   PRN Meds:.sodium chloride ,  acetaminophen , diphenhydrAMINE , naloxone  **AND** sodium chloride  flush, ondansetron , oxyCODONE , polyethylene glycol  Consultants: none  Procedures: none  Antibiotics: none  Assessment/Plan: Principal Problem:   Sickle cell pain crisis (HCC) Active Problems:   Anemia of chronic disease   Leukocytosis   Chronic pain syndrome   Abdominal pain   Sickle cell disease with pain crisis: Continue IVF 0.45% Saline @KVO , continue weight based Dilaudid  PCA, continue oral home pain medications as ordered. Monitor vitals very closely, Re-evaluate pain scale regularly, 2  L of Oxygen by [place]. Patient encouraged to ambulate on the hallway today.   Anemia of chronic disease: Hemoglobin is stable and consistent with patient's baseline.  No clinical indication for blood transfusion at this time.  Monitor closely.  Labs in AM.  Leukocytosis: Very mild leukocytosis that is consistent with patient's baseline.  Patient afebrile.  No signs of acute infection.  Will continue to monitor daily labs  Essential hypertension: Stable. Continue home medication.  Hyperemesis: Resolved. Tolerating PO,  Continue antiemetics as needed.   Code Status: Full Code Family Communication: N/A Disposition Plan: Not yet ready for discharge   [name] Cover NP      If 7PM-7AM, please contact night-coverage.  01/23/2024, 2:33 PM  LOS: 2 days

## 2024-01-24 LAB — COMPREHENSIVE METABOLIC PANEL WITH GFR
ALT: 24 U/L (ref 0–44)
AST: 39 U/L (ref 15–41)
Albumin: 3.5 g/dL (ref 3.5–5.0)
Alkaline Phosphatase: 70 U/L (ref 38–126)
Anion gap: 8 (ref 5–15)
BUN: 10 mg/dL (ref 6–20)
CO2: 24 mmol/L (ref 22–32)
Calcium: 9 mg/dL (ref 8.9–10.3)
Chloride: 107 mmol/L (ref 98–111)
Creatinine, Ser: 0.78 mg/dL (ref 0.61–1.24)
GFR, Estimated: >60 mL/min (ref >60)
Glucose, Bld: 84 mg/dL (ref 70–99)
Potassium: 4.1 mmol/L (ref 3.5–5.1)
Sodium: 139 mmol/L (ref 135–145)
Total Bilirubin: 1.7 mg/dL — ABNORMAL HIGH (ref 0.0–1.2)
Total Protein: 7.3 g/dL (ref 6.5–8.1)

## 2024-01-24 LAB — CBC
HCT: 21.3 % — ABNORMAL LOW (ref 39.0–52.0)
Hemoglobin: 7.4 g/dL — ABNORMAL LOW (ref 13.0–17.0)
MCH: 36.5 pg — ABNORMAL HIGH (ref 26.0–34.0)
MCHC: 34.7 g/dL (ref 30.0–36.0)
MCV: 104.9 fL — ABNORMAL HIGH (ref 80.0–100.0)
Platelets: 270 K/uL (ref 150–400)
RBC: 2.03 MIL/uL — ABNORMAL LOW (ref 4.22–5.81)
RDW: 20.2 % — ABNORMAL HIGH (ref 11.5–15.5)
WBC: 11.1 K/uL — ABNORMAL HIGH (ref 4.0–10.5)
nRBC: 1.1 % — ABNORMAL HIGH (ref 0.0–0.2)

## 2024-01-24 NOTE — Progress Notes (Signed)
 Subjective: Manuel Mcguire is a 33 year old male with a medical history significant for sickle cell disease, chronic pain syndrome, and opiate dependence and tolerance that was admitted for sickle cell pain crisis.  Patient continues to endorse pain of 6/10 today.  Mild nausea without vomiting, abdominal pain has resolved.  He denies any chest pain, shortness of breath, urinary symptoms, diarrhea, or constipation.  Objective:  Vital signs in last 24 hours:  Vitals:   01/24/24 0624 01/24/24 0747 01/24/24 1011 01/24/24 1202  BP: (!) 136/97  (!) 148/103   Pulse: 87  69   Resp: 18 18 14 19   Temp: 97.9 F (36.6 C)  98.7 F (37.1 C)   TempSrc: Oral  Oral   SpO2: 97% 97% 100% 100%  Weight:      Height:        Intake/Output from previous day:   Intake/Output Summary (Last 24 hours) at 01/24/2024 1402 Last data filed at 01/24/2024 1000 Gross per 24 hour  Intake 697.77 ml  Output 1300 ml  Net -602.23 ml    Physical Exam: General: Alert, awake, oriented x3, in no acute distress.  HEENT: [place]/AT PEER[place], EOMI Neck: Trachea midline,  no masses, no thyromegal,y no JVD, no carotid bruit OROPHARYNX:  Moist, No exudate/ erythema/lesions.  Heart: Regular rate and rhythm, without murmurs, rubs, gallops, PMI non-displaced, no heaves or thrills on palpation.  Lungs: Clear to auscultation, no wheezing or rhonchi noted. No increased vocal fremitus resonant to percussion  Abdomen: Soft, nontender, nondistended, positive bowel sounds, no masses no hepatosplenomegaly noted..  Neuro: No focal neurological deficits noted cranial nerves II through XII grossly intact. DTRs 2+ bilaterally upper and lower extremities. Strength 5 out of 5 in bilateral upper and lower extremities. Musculoskeletal: Generalized body tenderness.   Psychiatric: Patient alert and oriented x3, good insight and cognition, good recent to remote recall. Lymph node survey: No cervical axillary or inguinal lymphadenopathy noted.  Lab  Results:  Basic Metabolic Panel:    Component Value Date/Time   NA 139 01/24/2024 0642   K 4.1 01/24/2024 0642   CL 107 01/24/2024 0642   CO2 24 01/24/2024 0642   BUN 10 01/24/2024 0642   CREATININE 0.78 01/24/2024 0642   GLUCOSE 84 01/24/2024 0642   CALCIUM 9.0 01/24/2024 0642   CBC:    Component Value Date/Time   WBC 11.1 (H) 01/24/2024 0642   HGB 7.4 (L) 01/24/2024 0642   HCT 21.3 (L) 01/24/2024 0642   PLT 270 01/24/2024 0642   MCV 104.9 (H) 01/24/2024 0642   NEUTROABS 7.4 01/21/2024 0128   LYMPHSABS 2.9 01/21/2024 0128   MONOABS 1.7 (H) 01/21/2024 0128   EOSABS 0.1 01/21/2024 0128   BASOSABS 0.1 01/21/2024 0128    No results found for this or any previous visit (from the past 240 hours).  Studies/Results: No results found.  Medications: Scheduled Meds:  cholecalciferol   1,000 Units Oral Daily   enoxaparin  (LOVENOX ) injection  40 mg Subcutaneous Q24H   folic acid   1 mg Oral Daily   HYDROmorphone    Intravenous Q4H   hydroxyurea   1,500 mg Oral Daily   ketorolac   15 mg Intravenous Q6H   lisinopril   2.5 mg Oral Daily   senna-docusate  1 tablet Oral BID   Continuous Infusions:   PRN Meds:.acetaminophen , diphenhydrAMINE , naloxone  **AND** sodium chloride  flush, ondansetron , oxyCODONE , polyethylene glycol  Consultants: none  Procedures: none  Antibiotics: none  Assessment/Plan: Principal Problem:   Sickle cell pain crisis (HCC) Active Problems:   Anemia of  chronic disease   Leukocytosis   Chronic pain syndrome   Abdominal pain   Sickle cell disease with pain crisis: Continue IVF 0.45% Saline @KVO , continue weight based Dilaudid  PCA, continue oral home pain medications as ordered. Monitor vitals very closely, Re-evaluate pain scale regularly, 2 L of Oxygen by [place]. Patient encouraged to ambulate on the hallway today.   Anemia of chronic disease: Hemoglobin is stable and consistent with patient's baseline.  No clinical indication for blood transfusion  at this time.  Monitor closely.  Labs in AM.  Leukocytosis: Very mild leukocytosis that is consistent with patient's baseline.  Patient afebrile.  No signs of acute infection.  Will continue to monitor daily labs  Essential hypertension: Stable. Continue home medication.  Hyperemesis: Resolved. Tolerating PO,  Continue antiemetics as needed.   Code Status: Full Code Family Communication: N/A Disposition Plan: Not yet ready for discharge   [name] Cover NP      If 7PM-7AM, please contact night-coverage.  01/24/2024, 2:02 PM  LOS: 3 days

## 2024-01-24 NOTE — Plan of Care (Signed)
°  Problem: Clinical Measurements: Goal: Will remain free from infection Outcome: Progressing   Problem: Activity: Goal: Risk for activity intolerance will decrease Outcome: Progressing   Problem: Nutrition: Goal: Adequate nutrition will be maintained Outcome: Progressing

## 2024-01-24 NOTE — Plan of Care (Signed)

## 2024-01-25 NOTE — Plan of Care (Signed)

## 2024-01-25 NOTE — Progress Notes (Signed)
 Subjective: Manuel Mcguire is a 33 year old male with a medical history significant for sickle cell disease, chronic pain syndrome, and opiate dependence and tolerance that was admitted for sickle cell pain crisis.  Patient continues to endorse pain of 5-6/10 today.  Mild nausea without vomiting, abdominal pain has resolved.  He denies any chest pain, shortness of breath, urinary symptoms, diarrhea, or constipation.  Objective:  Vital signs in last 24 hours:  Vitals:   01/25/24 0528 01/25/24 0609 01/25/24 0813 01/25/24 0947  BP:  (!) 144/97  139/89  Pulse:  92  75  Resp: 14 18 13 20   Temp:  98.4 F (36.9 C)  98.2 F (36.8 C)  TempSrc:  Oral  Oral  SpO2: 96% 99% 97% 99%  Weight:      Height:        Intake/Output from previous day:   Intake/Output Summary (Last 24 hours) at 01/25/2024 1101 Last data filed at 01/25/2024 1010 Gross per 24 hour  Intake --  Output 2550 ml  Net -2550 ml    Physical Exam: General: Alert, awake, oriented x3, in no acute distress.  HEENT: [place]/AT PEERL, EOMI Neck: Trachea midline,  no masses, no thyromegal,y no JVD, no carotid bruit OROPHARYNX:  Moist, No exudate/ erythema/lesions.  Heart: Regular rate and rhythm, without murmurs, rubs, gallops, PMI non-displaced, no heaves or thrills on palpation.  Lungs: Clear to auscultation, no wheezing or rhonchi noted. No increased vocal fremitus resonant to percussion  Abdomen: Soft, nontender, nondistended, positive bowel sounds, no masses no hepatosplenomegaly noted..  Neuro: No focal neurological deficits noted cranial nerves II through XII grossly intact. DTRs 2+ bilaterally upper and lower extremities. Strength 5 out of 5 in bilateral upper and lower extremities. Musculoskeletal: Generalized body tenderness.   Psychiatric: Patient alert and oriented x3, good insight and cognition, good recent to remote recall. Lymph node survey: No cervical axillary or inguinal lymphadenopathy noted.  Lab  Results:  Basic Metabolic Panel:    Component Value Date/Time   NA 139 01/24/2024 0642   K 4.1 01/24/2024 0642   CL 107 01/24/2024 0642   CO2 24 01/24/2024 0642   BUN 10 01/24/2024 0642   CREATININE 0.78 01/24/2024 0642   GLUCOSE 84 01/24/2024 0642   CALCIUM 9.0 01/24/2024 0642   CBC:    Component Value Date/Time   WBC 11.1 (H) 01/24/2024 0642   HGB 7.4 (L) 01/24/2024 0642   HCT 21.3 (L) 01/24/2024 0642   PLT 270 01/24/2024 0642   MCV 104.9 (H) 01/24/2024 0642   NEUTROABS 7.4 01/21/2024 0128   LYMPHSABS 2.9 01/21/2024 0128   MONOABS 1.7 (H) 01/21/2024 0128   EOSABS 0.1 01/21/2024 0128   BASOSABS 0.1 01/21/2024 0128    No results found for this or any previous visit (from the past 240 hours).  Studies/Results: No results found.  Medications: Scheduled Meds:  cholecalciferol   1,000 Units Oral Daily   enoxaparin  (LOVENOX ) injection  40 mg Subcutaneous Q24H   folic acid   1 mg Oral Daily   HYDROmorphone    Intravenous Q4H   hydroxyurea   1,500 mg Oral Daily   ketorolac   15 mg Intravenous Q6H   lisinopril   2.5 mg Oral Daily   senna-docusate  1 tablet Oral BID   Continuous Infusions:   PRN Meds:.acetaminophen , diphenhydrAMINE , naloxone  **AND** sodium chloride  flush, ondansetron , oxyCODONE , polyethylene glycol  Consultants: none  Procedures: none  Antibiotics: none  Assessment/Plan: Principal Problem:   Sickle cell pain crisis (HCC) Active Problems:   Anemia of chronic disease  Leukocytosis   Chronic pain syndrome   Abdominal pain   Sickle cell disease with pain crisis: Continue IVF 0.45% Saline @KVO , continue weight based Dilaudid  PCA, continue oral home pain medications as ordered. Monitor vitals very closely, Re-evaluate pain scale regularly, 2 L of Oxygen by [place]. Patient encouraged to ambulate on the hallway today.   Anemia of chronic disease: Hemoglobin is stable and consistent with patient's baseline.  No clinical indication for blood transfusion  at this time.  Monitor closely.  Labs in AM.  Leukocytosis: Very mild leukocytosis that is consistent with patient's baseline.  Patient afebrile.  No signs of acute infection.  Will continue to monitor daily labs  Essential hypertension: Stable. Continue home medication.  Hyperemesis: Resolved. Tolerating PO,  Continue antiemetics as needed.   Code Status: Full Code Family Communication: N/A Disposition Plan: Not yet ready for discharge   [name] Cover NP      If 7PM-7AM, please contact night-coverage.  01/25/2024, 11:01 AM  LOS: 4 days

## 2024-01-25 NOTE — Plan of Care (Signed)

## 2024-01-26 LAB — CBC
HCT: 21.1 % — ABNORMAL LOW (ref 39.0–52.0)
Hemoglobin: 7.4 g/dL — ABNORMAL LOW (ref 13.0–17.0)
MCH: 35.1 pg — ABNORMAL HIGH (ref 26.0–34.0)
MCHC: 35.1 g/dL (ref 30.0–36.0)
MCV: 100 fL (ref 80.0–100.0)
Platelets: 371 K/uL (ref 150–400)
RBC: 2.11 MIL/uL — ABNORMAL LOW (ref 4.22–5.81)
RDW: 20 % — ABNORMAL HIGH (ref 11.5–15.5)
WBC: 11.2 K/uL — ABNORMAL HIGH (ref 4.0–10.5)
nRBC: 2.9 % — ABNORMAL HIGH (ref 0.0–0.2)

## 2024-01-26 MED ORDER — HYDROMORPHONE 1 MG/ML IV SOLN
INTRAVENOUS | Status: DC
  Administered 2024-01-26: 1.8 mg via INTRAVENOUS
  Administered 2024-01-26: 0.3 mg via INTRAVENOUS
  Administered 2024-01-26: 4 mg via INTRAVENOUS
  Administered 2024-01-27: 1.5 mg via INTRAVENOUS
  Administered 2024-01-27: 2.4 mL via INTRAVENOUS

## 2024-01-26 NOTE — Progress Notes (Signed)
 Subjective: Manuel Mcguire is a 33 year old male with a medical history significant for sickle cell disease, chronic pain syndrome, and opiate dependence and tolerance that was admitted for sickle cell pain crisis.  Patient continues to endorse pain of 5-6/10 today no improvement from yesterday, Mild nausea without vomiting, abdominal pain has resolved.  He denies any chest pain, shortness of breath, urinary symptoms, diarrhea, or constipation.  Objective:  Vital signs in last 24 hours:  Vitals:   01/26/24 0708 01/26/24 0908 01/26/24 1041 01/26/24 1218  BP:  136/87 (!) 143/104   Pulse:  65 73   Resp: 19 19  13   Temp:  97.9 F (36.6 C)    TempSrc:  Oral    SpO2: 97% 99%  100%  Weight:      Height:        Intake/Output from previous day:   Intake/Output Summary (Last 24 hours) at 01/26/2024 1318 Last data filed at 01/26/2024 1218 Gross per 24 hour  Intake 1680 ml  Output 2050 ml  Net -370 ml    Physical Exam: General: Alert, awake, oriented x3, in no acute distress.  HEENT: [place]/AT [place]ER[place], EOMI Neck: Trachea midline,  no masses, no thyromegal,y no JVD, no carotid bruit OROPHARYNX:  Moist, No exudate/ erythema/lesions.  Heart: Regular rate and rhythm, without murmurs, rubs, gallops, PMI non-displaced, no heaves or thrills on palpation.  Lungs: Clear to auscultation, no wheezing or rhonchi noted. No increased vocal fremitus resonant to percussion  Abdomen: Soft, nontender, nondistended, positive bowel sounds, no masses no hepatosplenomegaly noted..  Neuro: No focal neurological deficits noted cranial nerves II through XII grossly intact. DTRs 2+ bilaterally upper and lower extremities. Strength 5 out of 5 in bilateral upper and lower extremities. Musculoskeletal: Generalized body tenderness.   Psychiatric: Patient alert and oriented x3, good insight and cognition, good recent to remote recall. Lymph node survey: No cervical axillary or inguinal lymphadenopathy noted.  Lab  Results:  Basic Metabolic Panel:    Component Value Date/Time   NA 139 01/24/2024 0642   K 4.1 01/24/2024 0642   CL 107 01/24/2024 0642   CO2 24 01/24/2024 0642   BUN 10 01/24/2024 0642   CREATININE 0.78 01/24/2024 0642   GLUCOSE 84 01/24/2024 0642   CALCIUM 9.0 01/24/2024 0642   CBC:    Component Value Date/Time   WBC 11.2 (H) 01/26/2024 0953   HGB 7.4 (L) 01/26/2024 0953   HCT 21.1 (L) 01/26/2024 0953   PLT 371 01/26/2024 0953   MCV 100.0 01/26/2024 0953   NEUTROABS 7.4 01/21/2024 0128   LYMPHSABS 2.9 01/21/2024 0128   MONOABS 1.7 (H) 01/21/2024 0128   EOSABS 0.1 01/21/2024 0128   BASOSABS 0.1 01/21/2024 0128    No results found for this or any previous visit (from the past 240 hours).  Studies/Results: No results found.  Medications: Scheduled Meds:  cholecalciferol   1,000 Units Oral Daily   enoxaparin  (LOVENOX ) injection  40 mg Subcutaneous Q24H   folic acid   1 mg Oral Daily   HYDROmorphone    Intravenous Q4H   hydroxyurea   1,500 mg Oral Daily   lisinopril   2.5 mg Oral Daily   senna-docusate  1 tablet Oral BID   Continuous Infusions:   PRN Meds:.acetaminophen , diphenhydrAMINE , naloxone  **AND** sodium chloride  flush, ondansetron , oxyCODONE , polyethylene glycol  Consultants: none  Procedures: none  Antibiotics: none  Assessment/Plan: Principal Problem:   Sickle cell pain crisis (HCC) Active Problems:   Anemia of chronic disease   Leukocytosis   Chronic pain syndrome  Abdominal pain   Sickle cell disease with pain crisis: Continue IVF 0.45% Saline @KVO , Dilaudid  PCA titrated down, winning patient off for discharge tomorrow., continue oral home pain medications as ordered. Monitor vitals very closely, Re-evaluate pain scale regularly, 2 L of Oxygen by [place]. Patient encouraged to ambulate on the hallway today.   Anemia of chronic disease: Hemoglobin is stable and consistent with patient's baseline.  No clinical indication for blood transfusion at  this time.  Monitor closely.  Labs in AM.  Leukocytosis: Very mild leukocytosis that is consistent with patient's baseline.  Patient afebrile.  No signs of acute infection.  Will continue to monitor daily labs  Essential hypertension: Stable. Continue home medication.  Hyperemesis: Resolved. Tolerating PO,  Continue antiemetics as needed.   Code Status: Full Code Family Communication: N/A Disposition Plan:  Ready for discharge in the AM   [name] Cover NP      If 7PM-7AM, please contact night-coverage.  01/26/2024, 1:18 PM  LOS: 5 days

## 2024-01-26 NOTE — Plan of Care (Signed)
  Problem: Pain Managment: Goal: General experience of comfort will improve and/or be controlled Outcome: Progressing   Problem: Safety: Goal: Ability to remain free from injury will improve Outcome: Progressing   Problem: Skin Integrity: Goal: Risk for impaired skin integrity will decrease Outcome: Progressing   Problem: Fluid Volume: Goal: Ability to maintain a balanced intake and output will improve Outcome: Progressing

## 2024-01-26 NOTE — Plan of Care (Signed)

## 2024-01-27 LAB — CBC
HCT: 23.4 % — ABNORMAL LOW (ref 39.0–52.0)
Hemoglobin: 8.1 g/dL — ABNORMAL LOW (ref 13.0–17.0)
MCH: 35.1 pg — ABNORMAL HIGH (ref 26.0–34.0)
MCHC: 34.6 g/dL (ref 30.0–36.0)
MCV: 101.3 fL — ABNORMAL HIGH (ref 80.0–100.0)
Platelets: 450 K/uL — ABNORMAL HIGH (ref 150–400)
RBC: 2.31 MIL/uL — ABNORMAL LOW (ref 4.22–5.81)
RDW: 19.7 % — ABNORMAL HIGH (ref 11.5–15.5)
WBC: 10.4 K/uL (ref 4.0–10.5)
nRBC: 3.6 % — ABNORMAL HIGH (ref 0.0–0.2)

## 2024-01-27 NOTE — Plan of Care (Signed)
   Problem: Education: Goal: Knowledge of General Education information will improve Description Including pain rating scale, medication(s)/side effects and non-pharmacologic comfort measures Outcome: Progressing   Problem: Health Behavior/Discharge Planning: Goal: Ability to manage health-related needs will improve Outcome: Progressing

## 2024-01-27 NOTE — Discharge Summary (Signed)
 Physician Discharge Summary  Manuel Mcguire FMW:[id number] DOB: 01-Mar-1991 DOA: 01/21/2024  PCP: Irean Karolynn Batters, NP  Admit date: 01/21/2024  Discharge date: 01/27/2024  Discharge Diagnoses:  Principal Problem:   Sickle cell pain crisis (HCC) Active Problems:   Anemia of chronic disease   Leukocytosis   Chronic pain syndrome   Abdominal pain   Discharge Condition: Stable  Disposition:  Pt is discharged home in good condition and is to follow up with Dep, Karolynn Batters, NP this week to have labs evaluated. Manuel Mcguire is instructed to increase activity slowly and balance with rest for the next few days, and use prescribed medication to complete treatment of pain  Diet: Regular Wt Readings from Last 3 Encounters:  01/23/24 78.7 kg  01/15/24 79.7 kg  01/14/24 75 kg    History of present illness:  Manuel Mcguire  is a 33 y.o. male with a medical history significant for sickle cell disease, essential hypertension, status post lap chole with intraoperative cholangiogram on 11/22/2023, and anemia of chronic disease presented to the emergency department with pain primarily to abdomen and lower extremities that has been worsening since hospital discharge on 01/19/2024.Patient states he has been unable to tolerate food or drinks due to nausea and vomiting.  He cannot tolerate his home medication regimen.  He last had oxycodone  a day prior to ED visit without very much relief.  He states that he was taking promethazine at home for nausea without improvement. He states that he has not had vomiting since hospital admission.  Vomiting improved with IV antiemetics his pain intensity was 7/10 primarily to his lower extremities. He denied fever, chills, chest pain, shortness of breath, urinary symptoms, nausea, vomiting, or diarrhea.   ED Course:  Patient was accepted from med [facility] for admission.  While in the ER, he received several rounds of IV Dilaudid , IV Zofran , and  IV fluids.  Patient admitted to [facility] for further management of his sickle cell pain crisis.  Hospital Course:  Patient was admitted for sickle cell pain crisis and managed appropriately with IVF, IV Dilaudid  via PCA and IV Toradol , as well as other adjunct therapies per sickle cell pain management protocols. Patient is reporting well controlled nausea and has not had any emesis episode since hospital admission. Abdominal pain resolved. Patient states he is looking forward to his GI consult appointment as he would like to get to the real cause of his recent GI symptoms. Patient expressed frustration regarding the non resolution to his recent multiple ED Visit for the same issues. Patient is ambulating without assistance and toleration PO without Nausea or vomiting. Patient asked to be discharged home. He was therefore discharged home today in a hemodynamically stable condition.   [facility] will follow-up with PCP within 1 week of this discharge. Manuel Mcguire was counseled extensively about nonpharmacologic means of pain management, patient verbalized understanding and was appreciative of  the care received during this admission.   We discussed the need for good hydration, monitoring of hydration status, avoidance of heat, cold, stress, and infection triggers. We discussed the need to be adherent with taking home medications. Patient was reminded of the need to seek medical attention immediately if any symptom of bleeding, anemia, or infection occurs.  Discharge Exam: Vitals:   01/27/24 0840 01/27/24 1100  BP:  127/81  Pulse:  68  Resp: 14 16  Temp:  98.3 F (36.8 C)  SpO2: 100% 98%   Vitals:   01/27/24 0427 01/27/24  0800 01/27/24 0840 01/27/24 1100  BP: (!) 144/93   127/81  Pulse: 73   68  Resp: 13 14 14 16   Temp: 98 F (36.7 C)   98.3 F (36.8 C)  TempSrc: Oral     SpO2: 100% 100% 100% 98%  Weight:      Height:        General appearance : Awake, alert, not in any distress. Speech  Clear. Not toxic looking HEENT: Atraumatic and Normocephalic, pupils equally reactive to light and accomodation Neck: Supple, no JVD. No cervical lymphadenopathy.  Chest: Good air entry bilaterally, no added sounds  CVS: S1 S2 regular, no murmurs.  Abdomen: Bowel sounds present, Non tender and not distended with no gaurding, rigidity or rebound. Extremities: B/L Lower Ext shows no edema, both legs are warm to touch Neurology: Awake alert, and oriented X 3, CN II-XII intact, Non focal Skin: No Rash  Discharge Instructions  Discharge Instructions     Call MD for:  persistant nausea and vomiting   Complete by: As directed    Call MD for:  temperature >100.4   Complete by: As directed    Diet - low sodium heart healthy   Complete by: As directed    Increase activity slowly   Complete by: As directed       Allergies as of 01/27/2024   No Known Allergies      Medication List     TAKE these medications    CALCIUM PO Take 1 tablet by mouth daily.   cholecalciferol  25 MCG (1000 UNIT) tablet Commonly known as: VITAMIN D3 Take 1,000 Units by mouth daily.   folic acid  1 MG tablet Commonly known as: FOLVITE  Take 1 tablet (1 mg total) by mouth daily.   hydroxyurea  500 MG capsule Commonly known as: HYDREA  Take 3 capsules (1,500 mg total) by mouth daily. May take with food to minimize GI side effects.   lisinopril  2.5 MG tablet Commonly known as: ZESTRIL  Take 2.5 mg by mouth daily.   metoCLOPramide  10 MG tablet Commonly known as: REGLAN  Take 1 tablet (10 mg total) by mouth 3 (three) times daily with meals. What changed:  when to take this reasons to take this   ondansetron  4 MG disintegrating tablet Commonly known as: ZOFRAN -ODT 4mg  ODT q4 hours prn nausea/vomit What changed:  how much to take how to take this when to take this reasons to take this additional instructions   oxyCODONE  5 MG immediate release tablet Commonly known as: Oxy IR/ROXICODONE  Take 1  tablet (5 mg total) by mouth every 6 (six) hours as needed for severe pain (pain score 7-10).   promethazine 12.5 MG tablet Commonly known as: PHENERGAN Take 12.5 mg by mouth every 8 (eight) hours as needed for nausea or vomiting.   pseudoephedrine 120 MG 12 hr tablet Commonly known as: SUDAFED Take 120 mg by mouth every 12 (twelve) hours as needed for congestion.        The results of significant diagnostics from this hospitalization (including imaging, microbiology, ancillary and laboratory) are listed below for reference.    Significant Diagnostic Studies: CT ABDOMEN PELVIS W CONTRAST Result Date: 01/14/2024 CLINICAL DATA:  Acute abdominal pain, history of sickle cell disease EXAM: CT ABDOMEN AND PELVIS WITH CONTRAST TECHNIQUE: Multidetector CT imaging of the abdomen and pelvis was performed using the standard protocol following bolus administration of intravenous contrast. RADIATION DOSE REDUCTION: This exam was performed according to the departmental dose-optimization program which includes automated exposure control, adjustment  of the mA and/or kV according to patient size and/or use of iterative reconstruction technique. CONTRAST:  OMNIPAQUE  IOHEXOL  300 MG/ML  SOLN COMPARISON:  12/16/2023 FINDINGS: Lower chest: Lung bases are free of acute infiltrate or sizable effusion. Hepatobiliary: No focal liver abnormality is seen. Status post cholecystectomy. No biliary dilatation. Pancreas: Unremarkable. No pancreatic ductal dilatation or surrounding inflammatory changes. Spleen: Auto infarction of the spleen is noted. Adrenals/Urinary Tract: Adrenal glands are within normal limits. Kidneys demonstrate a normal enhancement pattern. No calculi are seen. No obstructive changes are noted. The bladder is decompressed. Stomach/Bowel: The appendix is within normal limits. No obstructive or inflammatory changes of the colon are seen. Small bowel and stomach are unremarkable. Vascular/Lymphatic: No  significant vascular findings are present. No enlarged abdominal or pelvic lymph nodes. Reproductive: Prostate is unremarkable. Other: No abdominal wall hernia or abnormality. No abdominopelvic ascites. Musculoskeletal: Postsurgical changes are noted in the proximal left hip. Generalized increased density is noted throughout the bony structures consistent with the given clinical history. Postsurgical changes are noted in the lower thoracic spine. No hardware failure is noted. IMPRESSION: Chronic changes consistent with the given clinical history. No acute abnormality noted. Electronically Signed   By: Oneil Devonshire M.D.   On: 01/14/2024 23:31   DG Chest Port 1 View Result Date: 01/14/2024 CLINICAL DATA:  Right upper quadrant abdominal pain EXAM: PORTABLE CHEST 1 VIEW COMPARISON:  Chest x-ray 12/16/2023 FINDINGS: The heart is enlarged. The lungs are clear. There is no pleural effusion or pneumothorax. Thoracic spinal fusion hardware is present. No acute fractures are seen. IMPRESSION: Cardiomegaly. No acute cardiopulmonary process. Electronically Signed   By: Greig Pique M.D.   On: 01/14/2024 22:28    Microbiology: No results found for this or any previous visit (from the past 240 hours).   Labs: Basic Metabolic Panel: Recent Labs  Lab 01/21/24 0128 01/24/24 0642  NA 142 139  K 3.9 4.1  CL 105 107  CO2 23 24  GLUCOSE 106* 84  BUN 6 10  CREATININE 0.88 0.78  CALCIUM 9.7 9.0   Liver Function Tests: Recent Labs  Lab 01/21/24 0128 01/24/24 0642  AST 61* 39  ALT 35 24  ALKPHOS 103 70  BILITOT 2.2* 1.7*  PROT 8.9* 7.3  ALBUMIN 4.7 3.5   Recent Labs  Lab 01/21/24 0128  LIPASE 28   No results for input(s): AMMONIA in the last 168 hours. CBC: Recent Labs  Lab 01/21/24 0128 01/24/24 0642 01/26/24 0953 01/27/24 0610  WBC 12.2* 11.1* 11.2* 10.4  NEUTROABS 7.4  --   --   --   HGB 9.6* 7.4* 7.4* 8.1*  HCT 26.6* 21.3* 21.1* 23.4*  MCV 99.3 104.9* 100.0 101.3*  PLT 269 270 371  450*   Cardiac Enzymes: No results for input(s): CKTOTAL, CKMB, CKMBINDEX, TROPONINI in the last 168 hours. BNP: Invalid input(s): POCBNP CBG: No results for input(s): GLUCAP in the last 168 hours.  Time coordinating discharge: 50 minutes  Signed:  [name] Cover NP   01/27/2024, 2:54 PM

## 2024-01-27 NOTE — Plan of Care (Signed)
 Problem: Education: Goal: Knowledge of General Education information will improve Description: Including pain rating scale, medication(s)/side effects and non-pharmacologic comfort measures 01/27/2024 1144 by Delores Kirsch, RN Outcome: Adequate for Discharge 01/27/2024 1144 by Delores Kirsch, RN Outcome: Adequate for Discharge 01/27/2024 1143 by Delores Kirsch, RN Outcome: Progressing   Problem: Health Behavior/Discharge Planning: Goal: Ability to manage health-related needs will improve 01/27/2024 1144 by Delores Kirsch, RN Outcome: Adequate for Discharge 01/27/2024 1144 by Delores Kirsch, RN Outcome: Adequate for Discharge 01/27/2024 1143 by Delores Kirsch, RN Outcome: Progressing   Problem: Clinical Measurements: Goal: Ability to maintain clinical measurements within normal limits will improve 01/27/2024 1144 by Delores Kirsch, RN Outcome: Adequate for Discharge 01/27/2024 1144 by Delores Kirsch, RN Outcome: Adequate for Discharge Goal: Will remain free from infection 01/27/2024 1144 by Delores Kirsch, RN Outcome: Adequate for Discharge 01/27/2024 1144 by Delores Kirsch, RN Outcome: Adequate for Discharge Goal: Diagnostic test results will improve 01/27/2024 1144 by Delores Kirsch, RN Outcome: Adequate for Discharge 01/27/2024 1144 by Delores Kirsch, RN Outcome: Adequate for Discharge Goal: Respiratory complications will improve 01/27/2024 1144 by Delores Kirsch, RN Outcome: Adequate for Discharge 01/27/2024 1144 by Delores Kirsch, RN Outcome: Adequate for Discharge Goal: Cardiovascular complication will be avoided 01/27/2024 1144 by Delores Kirsch, RN Outcome: Adequate for Discharge 01/27/2024 1144 by Delores Kirsch, RN Outcome: Adequate for Discharge   Problem: Activity: Goal: Risk for activity intolerance will decrease 01/27/2024 1144 by Delores Kirsch, RN Outcome: Adequate for Discharge 01/27/2024 1144 by Delores Kirsch, RN Outcome: Adequate for Discharge   Problem:  Nutrition: Goal: Adequate nutrition will be maintained 01/27/2024 1144 by Delores Kirsch, RN Outcome: Adequate for Discharge 01/27/2024 1144 by Delores Kirsch, RN Outcome: Adequate for Discharge   Problem: Coping: Goal: Level of anxiety will decrease 01/27/2024 1144 by Delores Kirsch, RN Outcome: Adequate for Discharge 01/27/2024 1144 by Delores Kirsch, RN Outcome: Adequate for Discharge   Problem: Elimination: Goal: Will not experience complications related to bowel motility 01/27/2024 1144 by Delores Kirsch, RN Outcome: Adequate for Discharge 01/27/2024 1144 by Delores Kirsch, RN Outcome: Adequate for Discharge Goal: Will not experience complications related to urinary retention 01/27/2024 1144 by Delores Kirsch, RN Outcome: Adequate for Discharge 01/27/2024 1144 by Delores Kirsch, RN Outcome: Adequate for Discharge   Problem: Pain Managment: Goal: General experience of comfort will improve and/or be controlled 01/27/2024 1144 by Delores Kirsch, RN Outcome: Adequate for Discharge 01/27/2024 1144 by Delores Kirsch, RN Outcome: Adequate for Discharge   Problem: Safety: Goal: Ability to remain free from injury will improve 01/27/2024 1144 by Delores Kirsch, RN Outcome: Adequate for Discharge 01/27/2024 1144 by Delores Kirsch, RN Outcome: Adequate for Discharge   Problem: Skin Integrity: Goal: Risk for impaired skin integrity will decrease 01/27/2024 1144 by Delores Kirsch, RN Outcome: Adequate for Discharge 01/27/2024 1144 by Delores Kirsch, RN Outcome: Adequate for Discharge   Problem: Education: Goal: Knowledge of vaso-occlusive preventative measures will improve 01/27/2024 1144 by Delores Kirsch, RN Outcome: Adequate for Discharge 01/27/2024 1144 by Delores Kirsch, RN Outcome: Adequate for Discharge Goal: Awareness of infection prevention will improve 01/27/2024 1144 by Delores Kirsch, RN Outcome: Adequate for Discharge 01/27/2024 1144 by Delores Kirsch, RN Outcome: Adequate  for Discharge Goal: Awareness of signs and symptoms of anemia will improve 01/27/2024 1144 by Delores Kirsch, RN Outcome: Adequate for Discharge 01/27/2024 1144 by Delores Kirsch, RN Outcome: Adequate for Discharge Goal: Long-term complications will improve 01/27/2024 1144 by Delores Kirsch, RN Outcome: Adequate for Discharge 01/27/2024 1144 by Delores Kirsch, RN Outcome: Adequate for Discharge  Problem: Self-Care: Goal: Ability to incorporate actions that prevent/reduce pain crisis will improve 01/27/2024 1144 by Delores Kirsch, RN Outcome: Adequate for Discharge 01/27/2024 1144 by Delores Kirsch, RN Outcome: Adequate for Discharge   Problem: Bowel/Gastric: Goal: Gut motility will be maintained 01/27/2024 1144 by Delores Kirsch, RN Outcome: Adequate for Discharge 01/27/2024 1144 by Delores Kirsch, RN Outcome: Adequate for Discharge   Problem: Tissue Perfusion: Goal: Complications related to inadequate tissue perfusion will be avoided or minimized Outcome: Adequate for Discharge   Problem: Respiratory: Goal: Pulmonary complications will be avoided or minimized Outcome: Adequate for Discharge Goal: Acute Chest Syndrome will be identified early to prevent complications Outcome: Adequate for Discharge   Problem: Fluid Volume: Goal: Ability to maintain a balanced intake and output will improve Outcome: Adequate for Discharge   Problem: Sensory: Goal: Pain level will decrease with appropriate interventions Outcome: Adequate for Discharge   Problem: Health Behavior: Goal: Postive changes in compliance with treatment and prescription regimens will improve Outcome: Adequate for Discharge

## 2024-01-28 DIAGNOSIS — Z9049 Acquired absence of other specified parts of digestive tract: Secondary | ICD-10-CM | POA: Diagnosis not present

## 2024-01-28 DIAGNOSIS — D5700 Hb-SS disease with crisis, unspecified: Secondary | ICD-10-CM | POA: Diagnosis not present

## 2024-01-28 DIAGNOSIS — E559 Vitamin D deficiency, unspecified: Secondary | ICD-10-CM | POA: Diagnosis not present

## 2024-01-28 DIAGNOSIS — F17290 Nicotine dependence, other tobacco product, uncomplicated: Secondary | ICD-10-CM | POA: Diagnosis not present

## 2024-01-28 DIAGNOSIS — Z87891 Personal history of nicotine dependence: Secondary | ICD-10-CM | POA: Diagnosis not present

## 2024-01-28 DIAGNOSIS — Z79899 Other long term (current) drug therapy: Secondary | ICD-10-CM | POA: Diagnosis not present

## 2024-01-28 DIAGNOSIS — I10 Essential (primary) hypertension: Secondary | ICD-10-CM | POA: Diagnosis not present

## 2024-01-28 DIAGNOSIS — R109 Unspecified abdominal pain: Secondary | ICD-10-CM | POA: Diagnosis not present

## 2024-01-28 DIAGNOSIS — R112 Nausea with vomiting, unspecified: Secondary | ICD-10-CM | POA: Diagnosis not present

## 2024-01-28 DIAGNOSIS — M5489 Other dorsalgia: Secondary | ICD-10-CM | POA: Diagnosis not present

## 2024-01-28 DIAGNOSIS — K760 Fatty (change of) liver, not elsewhere classified: Secondary | ICD-10-CM | POA: Diagnosis not present

## 2024-01-28 DIAGNOSIS — E860 Dehydration: Secondary | ICD-10-CM | POA: Diagnosis not present

## 2024-01-28 DIAGNOSIS — G4733 Obstructive sleep apnea (adult) (pediatric): Secondary | ICD-10-CM | POA: Diagnosis not present

## 2024-01-28 DIAGNOSIS — K209 Esophagitis, unspecified without bleeding: Secondary | ICD-10-CM | POA: Diagnosis not present

## 2024-01-29 DIAGNOSIS — R1115 Cyclical vomiting syndrome unrelated to migraine: Secondary | ICD-10-CM | POA: Diagnosis not present

## 2024-01-29 DIAGNOSIS — R112 Nausea with vomiting, unspecified: Secondary | ICD-10-CM | POA: Diagnosis not present

## 2024-01-29 DIAGNOSIS — R110 Nausea: Secondary | ICD-10-CM | POA: Diagnosis not present

## 2024-01-29 DIAGNOSIS — D5700 Hb-SS disease with crisis, unspecified: Secondary | ICD-10-CM | POA: Diagnosis not present

## 2024-01-29 DIAGNOSIS — K209 Esophagitis, unspecified without bleeding: Secondary | ICD-10-CM | POA: Diagnosis not present

## 2024-01-29 DIAGNOSIS — R12 Heartburn: Secondary | ICD-10-CM | POA: Diagnosis not present

## 2024-01-30 DIAGNOSIS — K209 Esophagitis, unspecified without bleeding: Secondary | ICD-10-CM | POA: Diagnosis not present

## 2024-01-30 DIAGNOSIS — D5700 Hb-SS disease with crisis, unspecified: Secondary | ICD-10-CM | POA: Diagnosis not present

## 2024-01-30 DIAGNOSIS — K2970 Gastritis, unspecified, without bleeding: Secondary | ICD-10-CM | POA: Diagnosis not present

## 2024-01-30 DIAGNOSIS — R12 Heartburn: Secondary | ICD-10-CM | POA: Diagnosis not present

## 2024-01-30 DIAGNOSIS — R112 Nausea with vomiting, unspecified: Secondary | ICD-10-CM | POA: Diagnosis not present

## 2024-01-30 DIAGNOSIS — R110 Nausea: Secondary | ICD-10-CM | POA: Diagnosis not present

## 2024-01-30 DIAGNOSIS — R1115 Cyclical vomiting syndrome unrelated to migraine: Secondary | ICD-10-CM | POA: Diagnosis not present

## 2024-01-30 NOTE — Progress Notes (Signed)
 Anesthesia Transfer of Care Note  Patient: Manuel Mcguire  Procedures performed: Upper Endoscopy (EGD) (Esophagus) ESOPHAGOGASTRODUODENOSCOPY, FLEXIBLE, TRANSORAL; WITH BIOPSY, SINGLE OR MULTIPLE  Report given/handover to: PACU Nurse  Transported with NCO2 @ 2LPM, spont ventilating well, VSS

## 2024-01-30 NOTE — Anesthesia Postprocedure Evaluation (Signed)
 Discharge from Anesthesia Care  Patient: Manuel Mcguire  Procedures performed: Upper Endoscopy (EGD) (Esophagus) ESOPHAGOGASTRODUODENOSCOPY, FLEXIBLE, TRANSORAL; WITH BIOPSY, SINGLE OR MULTIPLE  Anesthesia type: mac Vitals Value Taken Time  BP 122/86 01/30/24 10:48  Temp    Pulse 89 01/30/24 10:48  Resp 16 01/30/24 10:48  SpO2 100 % 01/30/24 10:48  Vitals shown include unfiled device data. *If vital value is absent from grid, please refer to corresponding flowsheet vitals data  Anesthesia Post Evaluation  Patient participation: complete - patient participated Level of consciousness: awake and alert Pain management: adequate Airway patency: patent Respiratory status: acceptable Cardiovascular status: acceptable Hydration status: acceptable    Recent Flowsheet Information: Vitals Value Taken Time  Respiratory (WDL)    Respiratory Pattern    Musculoskeletal (WDL)    Neuro (WDL)    Level of Consciousness    Orientation Level    Pain Assessment %% 0-10 01/30/24 10:45  Pain Score Zero 01/30/24 10:45  Wong-Baker FACES Pain Rating    Nausea Status    Vomiting Status    Gastrointestinal (WDL)    GI Symptoms

## 2024-01-30 NOTE — Anesthesia Preprocedure Evaluation (Signed)
 PASS-Perioperative Anesthesia & Surgical Screening Patient Alerts: Procedure:    Date/Time: 01/30/24 0930   Procedure: Upper Endoscopy (EGD) (Esophagus)   Location: [facility] PROC 1 / [facility]   Providers: Dinani, Amreen, MD      CC/HPI  Manuel Mcguire is a 33 y.o. male   Relevant Problems  No relevant active problems   Vitals (36hrs): Temp:  [36.6 C (97.8 F)-37 C (98.6 F)] 36.6 C (97.8 F) Heart Rate:  [79-102] 87 Resp:  [11-20] 11 BP: (138-153)/(72-98) 143/93 SpO2:  [96 %-100 %] 96 % Relevant Risk Scores: STOP BANG: 1 [facility] Activity Status Index (DASI): 0  Medical History  Past Medical History Past Surgical History   Past Surgical History:  Procedure Laterality Date  . CHOLECYSTECTOMY    . EXTRACTION TEETH Bilateral 05/07/2008  . SPINE SURGERY  11/05/2005    Social/Family History   Social History   Occupational History  . Not on file  Tobacco Use  . Smoking status: Former  . Smokeless tobacco: Current  . Tobacco comments:    Vape on rare occasions   Substance and Sexual Activity  . Alcohol use: No  . Drug use: No  . Sexual activity: Not on file   Vaping/E-Cigarettes  . Vaping/E-Cigarette Use    . Start Date    . Cartridges/Day    . Quit Date     Family History  Problem Relation Age of Onset  . High blood pressure (Hypertension) Mother   . Sickle cell trait Mother   . Sickle cell trait Father   . High blood pressure (Hypertension) Father   . Sickle cell anemia Sister    Allergies  No Known Allergies Medications   Current Facility-Administered Medications  Medication Dose Route Frequency Last Rate Last Admin  . acetaminophen  (TYLENOL ) tablet 975 mg  975 mg Oral TID   975 mg at 01/30/24 9380  . HYDROmorphone  (DILAUDID ) PCA 25 mg/25 mL (1 mg/mL) (opioid-tolerant)   Intravenous Continuous   PCA New Syringe/Cartridge at 01/29/24 1819  . naloxone  (NARCAN ) injection 0.4 mg  0.4 mg Intravenous As Directed      . sodium chloride  0.9%  infusion   Intravenous Continuous PRN 10 mL/hr at 01/28/24 1602 New Bag at 01/28/24 1602  . HYDROmorphone  CLINICIAN BOLUS (DILAUDID ) 1 mg/1 mL from PCA syringe (opioid-tolerant) 0.6 mg  0.6 mg Intravenous As Directed   0.6 mg at 01/30/24 0618  . lidocaine  (XYLOCAINE ) 1 % injection 0.5 mL  0.5 mL Subcutaneous As Directed      . [Held by provider] enoxaparin  (LOVENOX ) 40 mg/0.4 mL inj syringe 40 mg  40 mg Subcutaneous Daily   40 mg at 01/29/24 0902  . folic acid  (FOLVITE ) tablet 1 mg  1 mg Oral Daily   1 mg at 01/30/24 0756  . hydroxyurea  (HYDREA ) capsule 1,500 mg  1,500 mg Oral Daily   1,500 mg at 01/30/24 0756  . lisinopriL  (ZESTRIL ) tablet 2.5 mg  2.5 mg Oral Daily   2.5 mg at 01/30/24 0758  . ondansetron  (PF) (ZOFRAN ) injection 4 mg  4 mg Intravenous Q8H PRN      . prochlorperazine  (COMPAZINE ) injection 5 mg  5 mg Intravenous Q6H PRN      . pantoprazole (PROTONIX) injection 40 mg  40 mg Intravenous Q12H SCH   40 mg at 01/30/24 0801  . cholecalciferol  (VITAMIN D3) tablet 2,000 Units  2,000 Units Oral Daily   2,000 Units at 01/30/24 0800   Physical Exam  Phys Exam Test  Results    Lab Results  Component Value Date   WBC 11.6 (H) 01/30/2024   HGB 7.8 (L) 01/30/2024   HCT 20.8 (L) 01/30/2024   PLT 442 01/30/2024   ALT 35 01/30/2024   AST 38 01/30/2024   GLUCOSE 107 01/30/2024   NA 138 01/30/2024   K 3.9 01/30/2024   CL 105 01/30/2024   CALCIUM 9.0 01/30/2024   MG 1.7 (L) 01/29/2024   CREATININE 1.0 01/30/2024   BUN 9 01/30/2024   CO2 25 01/30/2024   ALB 3.4 (L) 01/30/2024   INR 1.4 (H) 01/30/2024   PT 16.5 (H) 01/30/2024     HGB  (Last 365 days)      Today 0334 Yesterday 0558 08/23 1033   HGB           7.8*                  8.0*                  8.3*                 Patient Instructions   Problem List   Patient Active Problem List  Diagnosis  . Hemoglobin SS disease   . Pneumonia  . Migraine  . Priapism due to disease classified elsewhere  .  History of fracture of left hip  . Moderate to severe OSA (Overall AHI 35/hr)  . Sickle cell pain crisis (CMS/HHS-HCC)  . Left hip pain  . Displaced fracture of base of neck of left femur, subsequent encounter for closed fracture with delayed healing  . Proteinuria  . Vomiting   Assessment & Plan  Plan  Day of Surgery  Airway: Mallampati: I - soft palate, uvula, fauces, pillars visible. TM distance: >3 FB. Mouth opening: Normal. Neck ROM: Full. Dental: Missing. Simple airway Cardiovasc: Rhythm: Regular. Rate: Normal.   Pulmonary: Normal  Plan ASA: 3 Plan: mac.  Induction: N/A.  Maintenance: Intravenous sedation.   Patient identification and NPO status confirmed, chart reviewed (medical history, including anesthesia, drug, and allergy history), and patient examined. Anesthesia care plan, including plan for postoperative pain control, analgesia regimen, and postoperative disposition discussed with patient and/or parent/legal guardian prior to start of anesthesia care. Patient and/or parent/legal guardian understand that there are anesthetic risks associated with this procedure and wishes to proceed.SABRA                                                                                                                                                                                                                   *  Some images could not be shown.

## 2024-01-31 DIAGNOSIS — R112 Nausea with vomiting, unspecified: Secondary | ICD-10-CM | POA: Diagnosis not present

## 2024-01-31 DIAGNOSIS — D5700 Hb-SS disease with crisis, unspecified: Secondary | ICD-10-CM | POA: Diagnosis not present

## 2024-01-31 DIAGNOSIS — R1115 Cyclical vomiting syndrome unrelated to migraine: Secondary | ICD-10-CM | POA: Diagnosis not present

## 2024-02-01 DIAGNOSIS — R112 Nausea with vomiting, unspecified: Secondary | ICD-10-CM | POA: Diagnosis not present

## 2024-02-01 DIAGNOSIS — D5700 Hb-SS disease with crisis, unspecified: Secondary | ICD-10-CM | POA: Diagnosis not present

## 2024-02-01 NOTE — Consults (Signed)
 Brief GI Pathology Note: Please see prior GI notes for patient's GI course. The patient underwent an EGD for chronic nausea and vomiting, stomach biopsies taken to rule out h pylori as cause (please see procedure note for findings). Pathology results from the procedural biopsies are as indicated below:       A.  Stomach, endoscopic biopsy:   Gastric antral mucosa with focal active gastritis.  Gastric oxyntic mucosa with no significant pathologic diagnosis. No evidence of intestinal metaplasia or dysplasia. No Helicobacter pylori seen on H&E stain. In the setting of acute gastritis, additional immunohistochemistry for Helicobacter species has been ordered and will be reported in an addendum.      Electronically signed by Rulon Elsie Ade, MD on 01/31/2024 at [phone] EDT  Addendum  A.  Stomach, endoscopic biopsy: Immunohistochemical stain for Helicobacter species is negative.     Recommendations provided in prior consult note for management.    If there are any questions or concerns, please contact the covering GI fellow at the functional pager listed below: GI General Consult Functional Pager: [phone]     ALMARIE NYLE MAYO, MD [facility] Fellow

## 2024-02-01 NOTE — Discharge Summary (Signed)
 [facility] Medicine Discharge Summary  Admit Date: 01/28/2024 Discharge Date: 02/01/2024  Admitting Physician: Josefa Alm Finger, MD Discharge Physician: Finger Josefa Alm, MD  Primary Care Provider: Irean Karolynn Batters, NP, Phone [phone]  Discharge Destination: Home  Admission Diagnoses:  Nausea and vomiting, unspecified vomiting type [R11.2] Abdominal pain [R10.9]  Discharge Diagnoses:  Principal Problem:   Vomiting Active Problems:   Hemoglobin SS disease    Sickle cell pain crisis (CMS/HHS-HCC) Resolved Problems:   * No resolved hospital problems. 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   Follow-up/Care Transition Plan: Sched. appts: Future Appointments  Date Time Provider Department Center  03/07/2024 10:20 AM Kimble Tawni Jansky, MD [facility] [place]  03/08/2024 12:30 PM Dep, Karolynn Batters, NP [facility]  10/23/2024 12:40 PM Dorcas Rosina Guernsey, NP [facility] [place]    Follow-up info: No follow-up provider specified.     Allergies/Intolerances:  No Known Allergies   New Adverse Drug Events: none  Medications:     Current Discharge Medication List     START taking these medications      Instructions  amitriptyline 25 MG tablet Quantity: 30 tablet Refills: 0  Commonly known as: ELAVIL Take 1 tablet (25 mg total) by mouth at bedtime Last time this was given: 25 mg on January 31, 2024  8:59 PM   pantoprazole 40 MG DR tablet Quantity: 30  tablet Refills: 0  Commonly known as: PROTONIX Take 1 tablet (40 mg total) by mouth once daily Last time this was given: 40 mg on February 01, 2024  8:33 AM       These medications have been CHANGED      Instructions  ondansetron  4 MG disintegrating tablet Quantity: 20 tablet Refills: 0 What changed:  when to take this reasons to take this  Commonly known as: ZOFRAN -ODT Take 1 tablet (4 mg total) by mouth every 8 (eight) hours as needed for Vomiting or Nausea Last time this was given: Ask your nurse or doctor   oxyCODONE  5 MG immediate release tablet Quantity: 90 tablet Refills: 0 What changed: Another medication with the same name was removed. Continue taking this medication, and follow the directions you see here.  Commonly known as: ROXICODONE  Take 1 tablet (5 mg total) by mouth every 6 (six) hours as needed for Pain (for moderate to severe pain) Doctor's comments: To be dispensed on or after 01/26/2024 Last time this was given: 5 mg on February 01, 2024  6:27 AM       CONTINUE taking these medications      Instructions  CALCIUM 500 ORAL Refills: 0  Take by mouth   cholecalciferol  2,000 unit capsule Quantity: 30 capsule Refills: 3  Commonly known as: VITAMIN D3 Take 1 capsule (2,000 Units total) by mouth once daily Last time this was given: Ask your nurse or doctor   folic acid  1 MG tablet Quantity: 90 tablet Refills: 2  Commonly known as: FOLVITE  Take 1 tablet (1,000 mcg total) by mouth once daily Last time this was given: 1 mg  on February 01, 2024  8:33 AM   hydroxyurea  500 mg capsule Quantity: 270 capsule Refills: 1  Commonly known as: HYDREA  Take 3 capsules (1,500 mg total) by mouth once daily Last time this was given: 1,500 mg on February 01, 2024  8:34 AM   lisinopriL  2.5 MG tablet Quantity: 90 tablet Refills: 1  Commonly known as: ZESTRIL  Take 1 tablet (2.5 mg total) by mouth once daily Last time this was given: 2.5 mg on February 01, 2024  8:34 AM    multivitamin tablet Refills: 0  Take 1 tablet by mouth once daily Reported on 06/06/2015   naloxone  4 mg/actuation nasal spray Quantity: 1 each Refills: 1  Commonly known as: NARCAN  Place 1 spray (4 mg total) into one nostril once as needed (if not breathing.) for up to 1 dose   promethazine 12.5 MG tablet Refills: 0  Commonly known as: PHENERGAN Take 12.5 mg by mouth every 8 (eight) hours as needed for Nausea or Vomiting Last time this was given: Ask your nurse or doctor       STOP taking these medications    metoclopramide  10 MG tablet Commonly known as: REGLAN          Brief History of Present Illness:   Manuel Mcguire is a 33 y.o. 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 +vomiting and unable to keep down any po. Symptoms returned and worsening after recent dc from OSH, and not relieved by home po meds. Denies fever/chest pain/shortness of breath.    Of note, patient was recently discharged from OSH 8/16 for sickle cell pain crisis and similar complaints of abdominal pain and vomiting. CT a/p done 8/9 at OSH was unremarkable. Patient had MRCP previously done 7/6 which showed mild dilatation of the CBD up to 0.8cm but no definite calculous or obstruction visible. Patient states he gets better in the hospital with antiemetics, but then symptoms returned. He feels like the sickle cell pain in his back and legs is also not resolved. He has an outpatient appt with [facility] but not until 10/2024 _____________________   Hospital Course by Problem:  SICKLE CELL INPATIENT SUMMARY - Medication: Hydromorphone  (Dilaudid ) PCA settings: initial 0.3mg  demand; max same - Transfusions: none - Complications: none - Discharge meds: IV antibiotics: none; opioids prescribed: none  Recurrent vomiting Cholecystectomy 11/22/23 Has had ~8 admissions/ED visits since CCY in June due to persistent vomiting. NBNB emesis that  resolves quickly after IV zofran  during admissions. This vomiting precipitates pain crises. No dysphagia symptoms. Last abdominal imaging: CT abd 8/9 at [facility]. Did not repeat given benign exam. His vomiting improved after IV zofran . GI consulted and performed EGD showing Grade A esophagitis. H&E negative for H pylori, IHC was still in process. Started on PPI daily and amitriptyline 25mg  qHS for possible cyclical vomiting syndrome. He was tolerating regular diet at discharge   HbSS with pain crisis Chronic anemia Mild pain crisis likely due to dehydration, vomiting. No chest pain or SOB. Hgb baseline 8.5-9.5. Started on dilaudid  PCA with 0.3mg  demand and 0.6mg  bolus. He was deescalated to his home oxycodone . Continued HU and folic acid    Leukocytosis Mild at 11.6. Likely due to pain crisis. No infectious symptoms   HTN Continue home lisinopril    Hx OSA Does not wear CPAP   Vitamin D  deficiency - continue supplementation     Surgeries and Procedures Performed:   Procedure(s): ESOPHAGOGASTRODUODENOSCOPY, FLEXIBLE, TRANSORAL; WITH  BIOPSY, SINGLE OR MULTIPLE _____________________  Discharge Exam:  BP 138/89 (BP Location: Left upper arm, Patient Position: Lying)   Pulse 70   Temp 36.7 C (98.1 F) (Oral)   Resp 18   Ht 170.2 cm (5' 7)   Wt 75.8 kg (167 lb 3.2 oz)   SpO2 100%   BMI 26.19 kg/m  O2 Device:  (PER CRNA) (01/30/24 1021) General appearance: alert, appears stated age, and no distress Head: Normocephalic, without obvious abnormality, atraumatic Eyes: EOMI, sclera anicteric Throat: MMM Lungs: clear to auscultation bilaterally Heart: regular rate and rhythm, S1, S2 normal, no murmur, click, rub or gallop Abdomen: soft, non-tender; bowel sounds normal; no masses,  no organomegaly Extremities: no LE edema Skin: Skin color, texture, turgor normal. No rashes or lesions Neurologic: Grossly normal  Pertinent Lab Testing: Recent Labs  Lab 01/28/24 1033  01/29/24 0558 01/30/24 0334  NA 139 135 138  K 4.1 3.9 3.9  CL 109* 104 105  CO2 21 22 25   BUN 7 8 9   CREATININE 1.1 1.1 1.0  GLUCOSE 126 122 107  CALCIUM 9.6 9.2 9.0   Recent Labs  Lab 01/28/24 1033 01/29/24 0558 01/30/24 0334  AST 61* 53* 38  ALT 48 42 35  ALKPHOS 87 79 74  TBILI 3.0*  3.0* 2.6* 2.1*    Recent Labs  Lab 01/30/24 0334 01/31/24 0556 02/01/24 0439  WBC 11.6* 12.7* 12.9*  HGB 7.8* 7.3* 8.0*  HCT 20.8* 19.9* 21.2*  PLT 442 437 413   Recent Labs  Lab 01/30/24 0334  INR 1.4*     Other Pertinent Labs:  None   Pertinent Imaging:   None _____________________  Code Status: Full Code Goals of care were not addressed during this admission.   Status on Discharge:  Current activity: Walks occasionally (02/01/24 0830) Current mobility: No limitation (02/01/24 0830)  Activity Recommendation: activity as tolerated  Other Discharge Instructions: Services setup at discharge: None Tubes/lines at discharge: None  Diet: Diet regular  Wound Care Order Instructions     None       _____________________  Time spent on discharge process: 35 minutes    JESSE ALM FINGER, MD [facility]  02/01/2024   Hospital Contact Information:  [facility]) [facility]) [facility])  Pending tests:  Laboratory: [phone] Microbiology: [phone] Pathology: [phone] Radiology: [phone]  General questions: [phone] Pending tests: Laboratory: [phone] Microbiology: [phone] Pathology: [phone] Radiology: [phone]  General questions:  [phone] Pending tests:  Laboratory: [phone] Microbiology: [phone] Pathology: [phone] Radiology: [phone]  General questions:  [phone]

## 2024-02-01 NOTE — Care Plan (Signed)
 Received patient lying in bed. A&O x4. ON RA. VSS. Endorses 7/10 pain in his bilateral lower extremity. Given PRN IV dilaudid , had a relief. All due meds given. Received a discharge order from the provider. AVS reviewed c/o Tax inspector. All questions/clarifications were answered prior to discharge. PIV removed, catheter intact Patient left the unit @ 1357 with all his belongings and AVS accompanied by transport

## 2024-02-01 NOTE — Progress Notes (Signed)
 Case Manager Discharge Summary / Closing Note  Expected Discharge Date & Time: 02/01/2024 at   Discharge Plan:  The patient has been involved in the development of this plan and is in agreement.    Post-Acute Services Coordinated: No resources indicated at this time  Funding for Discharge Medications: Drug assistance not needed    Transportation: Arrangements: patient arranged Service Arranged: private vehicle  Final ADT:  Final ADT Discharge Disposition: Home Based  Home Based: Home or Self Care                 Second IMM Received: Not indicated (02/01/24 1244)  Final Summary: Patient admitted to Inpatient status on 01/28/2024 for vomiting. He has a medical history significant for Sickle Cell Disease, Hypertension, lap chole on 11/22/2023 and anemia.   Patient plans to discharge today. Father at bedside to transport Patient home. No home care needs anticipated at this time.       MICHELE MAACK [phone]

## 2024-02-01 NOTE — Progress Notes (Signed)
 Virtual Nurse Discharge Note  DC instructions/AVS reviewed with: Patient and Family Patient is alert, interactive and appropriate; States and appears to be comfortable.  No complaints.  Patient still inside the 24 hour anesthesia window and unable to sign AVS: No Patient received a paper copy of AVS:Yes   Pt has active Mychart account: Yes  Questions answered; patient/family actively and appropriately engaged in discussion; able to verbalize understanding of instructions and rationale on AVS.   Care nurse notified of completion of discharge paperwork, instructions and education: Yes  VRN requested transport for patient: No   Interpreter needed for session?No  Appointment scheduled for follow up? Specialist appointments scheduled. Pt/pt's father instructed to f/u with PCP once returned home.  Specific/Additional Instructions or comment:

## 2024-02-02 NOTE — Progress Notes (Signed)
 [name]: Post Discharge Outcomes Assessment   02/02/2024 5:24 PM   Multiple attempts to reach the patient post discharge were unsuccessful.   Manuel Mcguire

## 2024-02-03 NOTE — Progress Notes (Addendum)
 [name]: Post Discharge Outcomes Assessment   02/04/2024 8:25 AM   Multiple attempts to reach the patient post discharge were unsuccessful.   Manuel Mcguire

## 2024-02-23 ENCOUNTER — Encounter (HOSPITAL_BASED_OUTPATIENT_CLINIC_OR_DEPARTMENT_OTHER): Payer: Self-pay | Admitting: Emergency Medicine

## 2024-02-23 ENCOUNTER — Emergency Department (HOSPITAL_BASED_OUTPATIENT_CLINIC_OR_DEPARTMENT_OTHER)
Admission: EM | Admit: 2024-02-23 | Discharge: 2024-02-23 | Disposition: A | Discharge status: Home / Self Care | Attending: Emergency Medicine | Admitting: Emergency Medicine

## 2024-02-23 ENCOUNTER — Other Ambulatory Visit: Payer: Self-pay

## 2024-02-23 DIAGNOSIS — R109 Unspecified abdominal pain: Secondary | ICD-10-CM | POA: Diagnosis not present

## 2024-02-23 DIAGNOSIS — D5700 Hb-SS disease with crisis, unspecified: Secondary | ICD-10-CM | POA: Insufficient documentation

## 2024-02-23 DIAGNOSIS — M549 Dorsalgia, unspecified: Secondary | ICD-10-CM | POA: Insufficient documentation

## 2024-02-23 LAB — COMPREHENSIVE METABOLIC PANEL WITH GFR
ALT: 16 U/L (ref 0–44)
AST: 45 U/L — ABNORMAL HIGH (ref 15–41)
Albumin: 4.4 g/dL (ref 3.5–5.0)
Alkaline Phosphatase: 93 U/L (ref 38–126)
Anion gap: 10 (ref 5–15)
BUN: 7 mg/dL (ref 6–20)
CO2: 23 mmol/L (ref 22–32)
Calcium: 8.9 mg/dL (ref 8.9–10.3)
Chloride: 109 mmol/L (ref 98–111)
Creatinine, Ser: 0.89 mg/dL (ref 0.61–1.24)
GFR, Estimated: >60 mL/min (ref >60)
Glucose, Bld: 102 mg/dL — ABNORMAL HIGH (ref 70–99)
Potassium: 3.8 mmol/L (ref 3.5–5.1)
Sodium: 141 mmol/L (ref 135–145)
Total Bilirubin: 2 mg/dL — ABNORMAL HIGH (ref 0.0–1.2)
Total Protein: 7.9 g/dL (ref 6.5–8.1)

## 2024-02-23 LAB — RETICULOCYTES
Immature Retic Fract: 32 % — ABNORMAL HIGH (ref 2.3–15.9)
RBC.: 2.32 MIL/uL — ABNORMAL LOW (ref 4.22–5.81)
Retic Count, Absolute: 59.6 K/uL (ref 19.0–186.0)
Retic Ct Pct: 2.6 % (ref 0.4–3.1)

## 2024-02-23 LAB — CBC WITH DIFFERENTIAL/PLATELET
Abs Immature Granulocytes: 0.05 K/uL (ref 0.00–0.07)
Basophils Absolute: 0 K/uL (ref 0.0–0.1)
Basophils Relative: 0 %
Eosinophils Absolute: 0.2 K/uL (ref 0.0–0.5)
Eosinophils Relative: 2 %
HCT: 23.8 % — ABNORMAL LOW (ref 39.0–52.0)
Hemoglobin: 8.9 g/dL — ABNORMAL LOW (ref 13.0–17.0)
Immature Granulocytes: 1 %
Lymphocytes Relative: 29 %
Lymphs Abs: 3.3 K/uL (ref 0.7–4.0)
MCH: 38.9 pg — ABNORMAL HIGH (ref 26.0–34.0)
MCHC: 37.4 g/dL — ABNORMAL HIGH (ref 30.0–36.0)
MCV: 103.9 fL — ABNORMAL HIGH (ref 80.0–100.0)
Monocytes Absolute: 1.8 K/uL — ABNORMAL HIGH (ref 0.1–1.0)
Monocytes Relative: 16 %
Neutro Abs: 5.7 K/uL (ref 1.7–7.7)
Neutrophils Relative %: 52 %
Platelets: 450 K/uL — ABNORMAL HIGH (ref 150–400)
RBC: 2.29 MIL/uL — ABNORMAL LOW (ref 4.22–5.81)
RDW: 19.2 % — ABNORMAL HIGH (ref 11.5–15.5)
Smear Review: NORMAL
WBC: 11.1 K/uL — ABNORMAL HIGH (ref 4.0–10.5)
nRBC: 1.4 % — ABNORMAL HIGH (ref 0.0–0.2)

## 2024-02-23 LAB — LIPASE, BLOOD: Lipase: 24 U/L (ref 11–51)

## 2024-02-23 MED ORDER — KETOROLAC TROMETHAMINE 30 MG/ML IJ SOLN
30.0000 mg | Freq: Once | INTRAMUSCULAR | Status: AC
  Administered 2024-02-23: 30 mg via INTRAVENOUS
  Filled 2024-02-23: qty 1

## 2024-02-23 MED ORDER — SODIUM CHLORIDE 0.9 % IV BOLUS
1000.0000 mL | Freq: Once | INTRAVENOUS | Status: AC
  Administered 2024-02-23: 1000 mL via INTRAVENOUS

## 2024-02-23 MED ORDER — HYDROMORPHONE HCL 1 MG/ML IJ SOLN
2.0000 mg | Freq: Once | INTRAMUSCULAR | Status: AC
  Administered 2024-02-23: 2 mg via INTRAVENOUS
  Filled 2024-02-23: qty 2

## 2024-02-23 MED ORDER — ONDANSETRON HCL 4 MG/2ML IJ SOLN
4.0000 mg | Freq: Once | INTRAMUSCULAR | Status: AC
  Administered 2024-02-23: 4 mg via INTRAVENOUS
  Filled 2024-02-23: qty 2

## 2024-02-23 NOTE — ED Provider Notes (Signed)
 [place] [facility] Provider Note   CSN: [id number] Arrival date & time: 02/23/24  0153     Patient presents with: Sickle Cell Pain Crisis   Manuel Mcguire is a 33 y.o. male.   Patient is a 33 year old male with past medical history of sickle cell disease.  Patient presenting with complaints of back pain consistent with his prior sickle cell crises.  He denies any fevers or chills.  No chest pain or difficulty breathing.  He also describes vomiting and abdominal cramping.  He has been having issues with this for many months and has undergone extensive workup both here and at [facility].  He was diagnosed with cyclic vomiting.  He states he has vomited several times today, but has had no diarrhea.  No fevers or chills.       Prior to Admission medications   Medication Sig Start Date End Date Taking? Authorizing Provider  CALCIUM PO Take 1 tablet by mouth daily.    [provider]  cholecalciferol  (VITAMIN D3) 25 MCG (1000 UNIT) tablet Take 1,000 Units by mouth daily.    [provider]  folic acid  (FOLVITE ) 1 MG tablet Take 1 tablet (1 mg total) by mouth daily. 10/27/12   Alvia Rosaline LABOR, MD  hydroxyurea  (HYDREA ) 500 MG capsule Take 3 capsules (1,500 mg total) by mouth daily. May take with food to minimize GI side effects. 10/27/12   Alvia Rosaline LABOR, MD  lisinopril  (ZESTRIL ) 2.5 MG tablet Take 2.5 mg by mouth daily.    [provider]  metoCLOPramide  (REGLAN ) 10 MG tablet Take 1 tablet (10 mg total) by mouth 3 (three) times daily with meals. Patient taking differently: Take 10 mg by mouth 3 (three) times daily as needed for nausea or vomiting (take with food). 12/02/23 12/01/24  Ijaola, Onyeje M, NP  ondansetron  (ZOFRAN -ODT) 4 MG disintegrating tablet 4mg  ODT q4 hours prn nausea/vomit Patient taking differently: Take 4 mg by mouth every 4 (four) hours as needed for nausea or vomiting (dissolve orally). 11/06/23   Patt Alm Macho, MD  oxyCODONE  (OXY IR/ROXICODONE ) 5 MG immediate release tablet Take 1 tablet (5 mg total) by mouth every 6 (six) hours as needed for severe pain (pain score 7-10). 12/02/23   Ijaola, Onyeje M, NP  promethazine (PHENERGAN) 12.5 MG tablet Take 12.5 mg by mouth every 8 (eight) hours as needed for nausea or vomiting. 12/27/23   [provider]  pseudoephedrine (SUDAFED) 120 MG 12 hr tablet Take 120 mg by mouth every 12 (twelve) hours as needed for congestion.    [provider]    Allergies: Patient has no known allergies.    Review of Systems  All other systems reviewed and are negative.   Updated Vital Signs BP 122/89 (BP Location: Right Arm)   Pulse 88   Temp 97.6 F (36.4 C) (Temporal)   Resp 20   Ht 5' 7 (1.702 m)   Wt 72.6 kg   SpO2 98%   BMI 25.06 kg/m   Physical Exam Vitals and nursing note reviewed.  Constitutional:      General: He is not in acute distress.    Appearance: He is well-developed. He is not diaphoretic.  HENT:     Head: Normocephalic and atraumatic.  Cardiovascular:     Rate and Rhythm: Normal rate and regular rhythm.     Heart sounds: No murmur heard.    No friction rub.  Pulmonary:     Effort:  Pulmonary effort is normal. No respiratory distress.     Breath sounds: Normal breath sounds. No wheezing or rales.  Abdominal:     General: Bowel sounds are normal. There is no distension.     Palpations: Abdomen is soft.     Tenderness: There is no abdominal tenderness.  Musculoskeletal:        General: Normal range of motion.     Cervical back: Normal range of motion and neck supple.  Skin:    General: Skin is warm and dry.  Neurological:     Mental Status: He is alert and oriented to person, place, and time.     Coordination: Coordination normal.     (all labs ordered are listed, but only abnormal results are displayed) Labs Reviewed - No data to display  EKG: None  Radiology: No results found.   Procedures    Medications Ordered in the ED  ondansetron  (ZOFRAN ) injection 4 mg (has no administration in time range)  HYDROmorphone  (DILAUDID ) injection 2 mg (has no administration in time range)  sodium chloride  0.9 % bolus 1,000 mL (has no administration in time range)  ketorolac  (TORADOL ) 30 MG/ML injection 30 mg (has no administration in time range)                                    Medical Decision Making Amount and/or Complexity of Data Reviewed Labs: ordered.  Risk Prescription drug management.   Patient is a 33 year old male with history of sickle cell disease presenting with back pain that he believes is a sickle cell crisis.  Patient arrives here with stable vital signs and is afebrile.  Physical examination basically unremarkable.  Laboratory studies obtained including CBC, CMP, lipase, and reticulocyte count.  Hemoglobin is 8.9 with white count of 11.1, but laboratory studies otherwise unremarkable.  Patient hydrated with normal saline and given multiple doses of Dilaudid .  He was also given Zofran  for nausea he is now feeling well enough to go home.  Patient to be discharged with as needed return.     Final diagnoses:  None    ED Discharge Orders     None          Geroldine Berg, MD 02/23/24 [phone]

## 2024-02-23 NOTE — ED Notes (Addendum)
 Patient reports vomiting 3 x today with diarrhea. Patient reports using home medication without relief. Patient denies taking home pain medication for fear that he would vomit. Patient also states normally they come in here and give me zofran  and dilaudid  IV and it gets better.

## 2024-02-23 NOTE — ED Triage Notes (Signed)
 Abdominal pain possible sickle cell pain, just had Endo and Dx with cyclic emesis.

## 2024-02-23 NOTE — Discharge Instructions (Signed)
 Continue medications as previously prescribed.  Follow-up with primary doctor if symptoms persist, and return to the ER if symptoms significantly worsen or change.

## 2024-03-05 ENCOUNTER — Emergency Department (HOSPITAL_BASED_OUTPATIENT_CLINIC_OR_DEPARTMENT_OTHER)
Admission: EM | Admit: 2024-03-05 | Discharge: 2024-03-05 | Disposition: A | Discharge status: Home / Self Care | Attending: Emergency Medicine | Admitting: Emergency Medicine

## 2024-03-05 ENCOUNTER — Encounter (HOSPITAL_BASED_OUTPATIENT_CLINIC_OR_DEPARTMENT_OTHER): Payer: Self-pay

## 2024-03-05 ENCOUNTER — Other Ambulatory Visit: Payer: Self-pay

## 2024-03-05 DIAGNOSIS — D5700 Hb-SS disease with crisis, unspecified: Secondary | ICD-10-CM | POA: Diagnosis present

## 2024-03-05 DIAGNOSIS — R112 Nausea with vomiting, unspecified: Secondary | ICD-10-CM | POA: Diagnosis not present

## 2024-03-05 DIAGNOSIS — M545 Low back pain, unspecified: Secondary | ICD-10-CM | POA: Insufficient documentation

## 2024-03-05 LAB — CBC WITH DIFFERENTIAL/PLATELET
Abs Immature Granulocytes: 0.09 K/uL — ABNORMAL HIGH (ref 0.00–0.07)
Basophils Absolute: 0.1 K/uL (ref 0.0–0.1)
Basophils Relative: 1 %
Eosinophils Absolute: 0.1 K/uL (ref 0.0–0.5)
Eosinophils Relative: 1 %
HCT: 25.9 % — ABNORMAL LOW (ref 39.0–52.0)
Hemoglobin: 9.4 g/dL — ABNORMAL LOW (ref 13.0–17.0)
Immature Granulocytes: 1 %
Lymphocytes Relative: 35 %
Lymphs Abs: 3.2 K/uL (ref 0.7–4.0)
MCH: 37.3 pg — ABNORMAL HIGH (ref 26.0–34.0)
MCHC: 36.3 g/dL — ABNORMAL HIGH (ref 30.0–36.0)
MCV: 102.8 fL — ABNORMAL HIGH (ref 80.0–100.0)
Monocytes Absolute: 1.4 K/uL — ABNORMAL HIGH (ref 0.1–1.0)
Monocytes Relative: 15 %
Neutro Abs: 4.5 K/uL (ref 1.7–7.7)
Neutrophils Relative %: 47 %
Platelets: 247 K/uL (ref 150–400)
RBC: 2.52 MIL/uL — ABNORMAL LOW (ref 4.22–5.81)
RDW: 19.5 % — ABNORMAL HIGH (ref 11.5–15.5)
WBC: 9.3 K/uL (ref 4.0–10.5)
nRBC: 1.5 % — ABNORMAL HIGH (ref 0.0–0.2)

## 2024-03-05 LAB — RETICULOCYTES
Immature Retic Fract: 29.2 % — ABNORMAL HIGH (ref 2.3–15.9)
RBC.: 2.56 MIL/uL — ABNORMAL LOW (ref 4.22–5.81)
Retic Count, Absolute: 90.6 K/uL (ref 19.0–186.0)
Retic Ct Pct: 3.5 % — ABNORMAL HIGH (ref 0.4–3.1)

## 2024-03-05 LAB — COMPREHENSIVE METABOLIC PANEL WITH GFR
ALT: 22 U/L (ref 0–44)
AST: 42 U/L — ABNORMAL HIGH (ref 15–41)
Albumin: 4.6 g/dL (ref 3.5–5.0)
Alkaline Phosphatase: 122 U/L (ref 38–126)
Anion gap: 11 (ref 5–15)
BUN: 10 mg/dL (ref 6–20)
CO2: 22 mmol/L (ref 22–32)
Calcium: 9.4 mg/dL (ref 8.9–10.3)
Chloride: 107 mmol/L (ref 98–111)
Creatinine, Ser: 0.83 mg/dL (ref 0.61–1.24)
GFR, Estimated: >60 mL/min (ref >60)
Glucose, Bld: 138 mg/dL — ABNORMAL HIGH (ref 70–99)
Potassium: 3.9 mmol/L (ref 3.5–5.1)
Sodium: 140 mmol/L (ref 135–145)
Total Bilirubin: 2.4 mg/dL — ABNORMAL HIGH (ref 0.0–1.2)
Total Protein: 8.8 g/dL — ABNORMAL HIGH (ref 6.5–8.1)

## 2024-03-05 LAB — LIPASE, BLOOD: Lipase: 27 U/L (ref 11–51)

## 2024-03-05 MED ORDER — HYDROMORPHONE HCL 1 MG/ML IJ SOLN
2.0000 mg | INTRAMUSCULAR | Status: AC
  Administered 2024-03-05: 2 mg via INTRAVENOUS
  Filled 2024-03-05: qty 2

## 2024-03-05 MED ORDER — ONDANSETRON HCL 4 MG/2ML IJ SOLN
4.0000 mg | INTRAMUSCULAR | Status: DC | PRN
  Administered 2024-03-05: 4 mg via INTRAVENOUS
  Filled 2024-03-05: qty 2

## 2024-03-05 MED ORDER — HYDROMORPHONE HCL 1 MG/ML IJ SOLN
2.0000 mg | INTRAMUSCULAR | Status: AC
  Administered 2024-03-05: 1 mg via INTRAVENOUS

## 2024-03-05 MED ORDER — DIPHENHYDRAMINE HCL 50 MG/ML IJ SOLN
INTRAMUSCULAR | Status: AC
  Filled 2024-03-05: qty 1

## 2024-03-05 MED ORDER — HYDROMORPHONE HCL 1 MG/ML IJ SOLN
1.0000 mg | Freq: Once | INTRAMUSCULAR | Status: DC
  Filled 2024-03-05: qty 1

## 2024-03-05 MED ORDER — SODIUM CHLORIDE 0.9 % IV SOLN
12.5000 mg | Freq: Once | INTRAVENOUS | Status: AC
  Administered 2024-03-05: 12.5 mg via INTRAVENOUS
  Filled 2024-03-05: qty 0.25

## 2024-03-05 NOTE — Discharge Instructions (Addendum)
 It was a pleasure taking care of you here today  Make sure to follow-up outpatient, return for new or worsening symptoms

## 2024-03-05 NOTE — ED Provider Notes (Signed)
 [place] [facility] Provider Note   CSN: [id number] Arrival date & time: 03/05/24  1426     Patient presents with: Sickle Cell Pain Crisis   Manuel Mcguire is a 33 y.o. male history of sickle cell, chronic abdominal pain here for evaluation of back pain.  Started yesterday.  States he was diagnosed last month with cyclical vomiting.  States his vomiting has significantly improved from his baseline however yesterday developed some NBNB emesis.  He has some lower back pain which he relates to he has episodes of nausea and vomiting.  No abdominal pain.  He denies any recent falls or injuries.  No fever, chest pain, shortness of breath, diarrhea, constipation.  He denies any EtOH use, marijuana use.  States he went to the left ear earlier was unable to keep down any of his food due to his nausea.  He denies any suspicious food intake.  No IVDU, bowel or bladder incontinence, saddle paresthesia.  Pain feels similar to his typical sickle cell pain  Uses Reglan  TID for vomiting which he states typically helps.  States he has about 1 episode monthly where he has persistent nausea and vomiting till he receives IV antiemetics and feels improved.   HPI     Prior to Admission medications   Medication Sig Start Date End Date Taking? Authorizing Provider  CALCIUM PO Take 1 tablet by mouth daily.    [provider]  cholecalciferol  (VITAMIN D3) 25 MCG (1000 UNIT) tablet Take 1,000 Units by mouth daily.    [provider]  folic acid  (FOLVITE ) 1 MG tablet Take 1 tablet (1 mg total) by mouth daily. 10/27/12   Alvia Rosaline LABOR, MD  hydroxyurea  (HYDREA ) 500 MG capsule Take 3 capsules (1,500 mg total) by mouth daily. May take with food to minimize GI side effects. 10/27/12   Alvia Rosaline LABOR, MD  lisinopril  (ZESTRIL ) 2.5 MG tablet Take 2.5 mg by mouth daily.    [provider]  metoCLOPramide  (REGLAN ) 10 MG tablet Take 1 tablet (10 mg  total) by mouth 3 (three) times daily with meals. Patient taking differently: Take 10 mg by mouth 3 (three) times daily as needed for nausea or vomiting (take with food). 12/02/23 12/01/24  Ijaola, Onyeje M, NP  ondansetron  (ZOFRAN -ODT) 4 MG disintegrating tablet 4mg  ODT q4 hours prn nausea/vomit Patient taking differently: Take 4 mg by mouth every 4 (four) hours as needed for nausea or vomiting (dissolve orally). 11/06/23   Patt Alm Macho, MD  oxyCODONE  (OXY IR/ROXICODONE ) 5 MG immediate release tablet Take 1 tablet (5 mg total) by mouth every 6 (six) hours as needed for severe pain (pain score 7-10). 12/02/23   Ijaola, Onyeje M, NP  promethazine (PHENERGAN) 12.5 MG tablet Take 12.5 mg by mouth every 8 (eight) hours as needed for nausea or vomiting. 12/27/23   [provider]  pseudoephedrine (SUDAFED) 120 MG 12 hr tablet Take 120 mg by mouth every 12 (twelve) hours as needed for congestion.    [provider]    Allergies: Patient has no known allergies.    Review of Systems  Constitutional: Negative.   HENT: Negative.    Respiratory: Negative.    Cardiovascular: Negative.   Gastrointestinal:  Positive for nausea and vomiting. Negative for abdominal distention, abdominal pain, anal bleeding, blood in stool, constipation, diarrhea and rectal pain.  Genitourinary: Negative.   Musculoskeletal:  Positive for back pain.  Skin: Negative.   Neurological: Negative.  All other systems reviewed and are negative.   Updated Vital Signs BP (!) 134/99   Pulse 97   Temp 98.2 F (36.8 C) (Oral)   Resp 18   SpO2 97%   Physical Exam Vitals and nursing note reviewed.  Constitutional:      General: He is not in acute distress.    Appearance: He is well-developed. He is not ill-appearing, toxic-appearing or diaphoretic.  HENT:     Head: Normocephalic and atraumatic.     Nose: Nose normal.     Mouth/Throat:     Mouth: Mucous membranes are moist.  Eyes:     Pupils: Pupils are  equal, round, and reactive to light.  Cardiovascular:     Rate and Rhythm: Normal rate and regular rhythm.     Pulses: Normal pulses.     Heart sounds: Normal heart sounds.  Pulmonary:     Effort: Pulmonary effort is normal. No respiratory distress.     Breath sounds: Normal breath sounds.  Abdominal:     General: Bowel sounds are normal. There is no distension.     Palpations: Abdomen is soft.     Tenderness: There is no abdominal tenderness. There is no right CVA tenderness, left CVA tenderness or guarding.  Musculoskeletal:        General: Tenderness present. No swelling, deformity or signs of injury. Normal range of motion.     Cervical back: Normal range of motion and neck supple.     Right lower leg: No edema.     Left lower leg: No edema.     Comments: Diffuse generalized lower back tenderness.  No focal midline C/T/L tenderness. Neg SLR. Compartments soft. No LE bony tenderness  Skin:    General: Skin is warm and dry.     Capillary Refill: Capillary refill takes less than 2 seconds.  Neurological:     General: No focal deficit present.     Mental Status: He is alert and oriented to person, place, and time.     Cranial Nerves: No cranial nerve deficit.     Sensory: No sensory deficit.     Motor: No weakness.     Gait: Gait normal.     (all labs ordered are listed, but only abnormal results are displayed) Labs Reviewed  COMPREHENSIVE METABOLIC PANEL WITH GFR - Abnormal; Notable for the following components:      Result Value   Glucose, Bld 138 (*)    Total Protein 8.8 (*)    AST 42 (*)    Total Bilirubin 2.4 (*)    All other components within normal limits  CBC WITH DIFFERENTIAL/PLATELET - Abnormal; Notable for the following components:   RBC 2.52 (*)    Hemoglobin 9.4 (*)    HCT 25.9 (*)    MCV 102.8 (*)    MCH 37.3 (*)    MCHC 36.3 (*)    RDW 19.5 (*)    nRBC 1.5 (*)    Monocytes Absolute 1.4 (*)    Abs Immature Granulocytes 0.09 (*)    All other components  within normal limits  RETICULOCYTES - Abnormal; Notable for the following components:   Retic Ct Pct 3.5 (*)    RBC. 2.56 (*)    Immature Retic Fract 29.2 (*)    All other components within normal limits  LIPASE, BLOOD  URINALYSIS, ROUTINE W REFLEX MICROSCOPIC    EKG: None  Radiology: No results found.   Procedures   Medications Ordered in the ED  ondansetron  (ZOFRAN ) injection 4 mg (4 mg Intravenous Given 03/05/24 1536)  diphenhydrAMINE  (BENADRYL ) 50 MG/ML injection (has no administration in time range)  HYDROmorphone  (DILAUDID ) injection 2 mg (1 mg Intravenous Given 03/05/24 1544)  HYDROmorphone  (DILAUDID ) injection 2 mg (2 mg Intravenous Given 03/05/24 1633)  HYDROmorphone  (DILAUDID ) injection 2 mg (2 mg Intravenous Given 03/05/24 1718)  diphenhydrAMINE  (BENADRYL ) 12.5 mg in sodium chloride  0.9 % 50 mL IVPB (12.5 mg Intravenous New Bag/Given 03/05/24 2239)   33 year old here for evaluation of lower back pain which was consistent to his prior sickle cell crises.  Associated NBNB emesis.  States he had a recent diagnosis of cyclical vomiting.  He denies any EtOH or marijuana use.  No abdominal pain.  He has no focal midline C/T/L tenderness.  Nontender lower extremities.  No fever, history of IVDU, bowel or bladder incontinence, saddle paresthesia, numbness or weakness.  No chest pain or shortness of breath.  Will plan on labs, pain management and reassess.  Labs and imaging personally viewed and interpreted:  CBC wo leukocytosis, hemoglobin 9.4 similar to prior Metabolic panel AST 42, T. bili 2.4, similar to prior Lipase 27  Patient reassessed.  Feels improved, tolerating p.o. intake.  Pain  improved with BS sickle cell order set.  Will DC home.  Low suspicion for cauda equina, discitis, osteomyelitis, transverse myelitis, abscess, bony infection, perforated viscus, intra-abdominal etiologies such as obstruction, perforation, bacterial infectious process  The patient has been  appropriately medically screened and/or stabilized in the ED. I have low suspicion for any other emergent medical condition which would require further screening, evaluation or treatment in the ED or require inpatient management.  Patient is hemodynamically stable and in no acute distress.  Patient able to ambulate in department prior to ED.  Evaluation does not show acute pathology that would require ongoing or additional emergent interventions while in the emergency department or further inpatient treatment.  I have discussed the diagnosis with the patient and answered all questions.  Pain is been managed while in the emergency department and patient has no further complaints prior to discharge.  Patient is comfortable with plan discussed in room and is stable for discharge at this time.  I have discussed strict return precautions for returning to the emergency department.  Patient was encouraged to follow-up with PCP/specialist refer to at discharge.                                    Medical Decision Making Amount and/or Complexity of Data Reviewed Independent Historian: friend External Data Reviewed: labs, radiology, ECG and notes. Labs: ordered. Decision-making details documented in ED Course.  Risk OTC drugs. Prescription drug management. Decision regarding hospitalization. Diagnosis or treatment significantly limited by social determinants of health.        Final diagnoses:  Sickle cell pain crisis (HCC)  Nausea and vomiting, unspecified vomiting type    ED Discharge Orders     None          Phelix Fudala A, PA-C 03/05/24 1809    Geraldene Hamilton, MD 03/06/24 2228

## 2024-03-05 NOTE — ED Triage Notes (Signed)
 Reports sickle cell crisis and lower back pain since yesterday.   Reports generalized abd pain, N/V.  Denies diarrhea, constipation, urinary symptoms.   Reports recent dx of cyclical vomiting syndrome.

## 2024-03-08 ENCOUNTER — Other Ambulatory Visit: Payer: Self-pay

## 2024-03-08 ENCOUNTER — Emergency Department (HOSPITAL_BASED_OUTPATIENT_CLINIC_OR_DEPARTMENT_OTHER)
Admission: EM | Admit: 2024-03-08 | Discharge: 2024-03-09 | Disposition: A | Discharge status: Home / Self Care | Attending: Emergency Medicine | Admitting: Emergency Medicine

## 2024-03-08 ENCOUNTER — Encounter (HOSPITAL_BASED_OUTPATIENT_CLINIC_OR_DEPARTMENT_OTHER): Payer: Self-pay

## 2024-03-08 DIAGNOSIS — D5700 Hb-SS disease with crisis, unspecified: Secondary | ICD-10-CM | POA: Diagnosis not present

## 2024-03-08 DIAGNOSIS — R112 Nausea with vomiting, unspecified: Secondary | ICD-10-CM | POA: Diagnosis present

## 2024-03-08 DIAGNOSIS — R1084 Generalized abdominal pain: Secondary | ICD-10-CM | POA: Diagnosis present

## 2024-03-08 DIAGNOSIS — D72829 Elevated white blood cell count, unspecified: Secondary | ICD-10-CM | POA: Insufficient documentation

## 2024-03-08 DIAGNOSIS — M79606 Pain in leg, unspecified: Secondary | ICD-10-CM | POA: Diagnosis not present

## 2024-03-08 LAB — COMPREHENSIVE METABOLIC PANEL WITH GFR
ALT: 20 U/L (ref 0–44)
AST: 52 U/L — ABNORMAL HIGH (ref 15–41)
Albumin: 4.4 g/dL (ref 3.5–5.0)
Alkaline Phosphatase: 120 U/L (ref 38–126)
Anion gap: 11 (ref 5–15)
BUN: 7 mg/dL (ref 6–20)
CO2: 23 mmol/L (ref 22–32)
Calcium: 9.3 mg/dL (ref 8.9–10.3)
Chloride: 108 mmol/L (ref 98–111)
Creatinine, Ser: 0.78 mg/dL (ref 0.61–1.24)
GFR, Estimated: >60 mL/min (ref >60)
Glucose, Bld: 112 mg/dL — ABNORMAL HIGH (ref 70–99)
Potassium: 4.4 mmol/L (ref 3.5–5.1)
Sodium: 142 mmol/L (ref 135–145)
Total Bilirubin: 1.9 mg/dL — ABNORMAL HIGH (ref 0.0–1.2)
Total Protein: 8.6 g/dL — ABNORMAL HIGH (ref 6.5–8.1)

## 2024-03-08 LAB — CBC
HCT: 24.2 % — ABNORMAL LOW (ref 39.0–52.0)
Hemoglobin: 8.9 g/dL — ABNORMAL LOW (ref 13.0–17.0)
MCH: 37.4 pg — ABNORMAL HIGH (ref 26.0–34.0)
MCHC: 36.8 g/dL — ABNORMAL HIGH (ref 30.0–36.0)
MCV: 101.7 fL — ABNORMAL HIGH (ref 80.0–100.0)
Platelets: 177 K/uL (ref 150–400)
RBC: 2.38 MIL/uL — ABNORMAL LOW (ref 4.22–5.81)
RDW: 20.1 % — ABNORMAL HIGH (ref 11.5–15.5)
WBC: 12 K/uL — ABNORMAL HIGH (ref 4.0–10.5)
nRBC: 2.2 % — ABNORMAL HIGH (ref 0.0–0.2)

## 2024-03-08 LAB — LIPASE, BLOOD: Lipase: 23 U/L (ref 11–51)

## 2024-03-08 LAB — URINALYSIS, MICROSCOPIC (REFLEX): WBC, UA: NONE SEEN WBC/hpf (ref 0–5)

## 2024-03-08 LAB — URINALYSIS, ROUTINE W REFLEX MICROSCOPIC
Bilirubin Urine: NEGATIVE
Glucose, UA: NEGATIVE mg/dL
Ketones, ur: NEGATIVE mg/dL
Leukocytes,Ua: NEGATIVE
Nitrite: NEGATIVE
Protein, ur: >=300 mg/dL — AB
Specific Gravity, Urine: 1.02 (ref 1.005–1.030)
pH: 6 (ref 5.0–8.0)

## 2024-03-08 LAB — RETICULOCYTES
Immature Retic Fract: 37.8 % — ABNORMAL HIGH (ref 2.3–15.9)
RBC.: 2.38 MIL/uL — ABNORMAL LOW (ref 4.22–5.81)
Retic Count, Absolute: 108.1 K/uL (ref 19.0–186.0)
Retic Ct Pct: 4.5 % — ABNORMAL HIGH (ref 0.4–3.1)

## 2024-03-08 LAB — URINE DRUG SCREEN
Amphetamines: NEGATIVE
Barbiturates: NEGATIVE
Benzodiazepines: NEGATIVE
Cocaine: NEGATIVE
Fentanyl: NEGATIVE
Methadone Scn, Ur: NEGATIVE
Opiates: NEGATIVE
Tetrahydrocannabinol: NEGATIVE

## 2024-03-08 MED ORDER — HYDROMORPHONE HCL 1 MG/ML IJ SOLN
2.0000 mg | Freq: Once | INTRAMUSCULAR | Status: AC
  Administered 2024-03-08: 2 mg via INTRAVENOUS
  Filled 2024-03-08: qty 2

## 2024-03-08 MED ORDER — ONDANSETRON HCL 4 MG/2ML IJ SOLN
4.0000 mg | Freq: Once | INTRAMUSCULAR | Status: AC
  Administered 2024-03-08: 4 mg via INTRAVENOUS
  Filled 2024-03-08: qty 2

## 2024-03-08 MED ORDER — SODIUM CHLORIDE 0.9 % IV BOLUS
1000.0000 mL | Freq: Once | INTRAVENOUS | Status: AC
  Administered 2024-03-08: 1000 mL via INTRAVENOUS

## 2024-03-08 MED ORDER — HYDROMORPHONE HCL 1 MG/ML IJ SOLN
1.0000 mg | Freq: Once | INTRAMUSCULAR | Status: AC
  Administered 2024-03-08: 1 mg via INTRAVENOUS
  Filled 2024-03-08: qty 1

## 2024-03-08 MED ORDER — DIPHENHYDRAMINE HCL 50 MG/ML IJ SOLN
25.0000 mg | Freq: Once | INTRAMUSCULAR | Status: AC
  Administered 2024-03-08: 25 mg via INTRAVENOUS
  Filled 2024-03-08: qty 1

## 2024-03-08 NOTE — ED Triage Notes (Signed)
 Sx x yesterday. Pt reports sickle cell crisis exacerbated by generalized abdominal pain and N/V. Pt reports bilateral lower leg pain and lower back pain secondary to sickle cell crisis. Pt states that he was diagnosed with cyclical vomiting syndrome. Denies marijuana use.

## 2024-03-08 NOTE — ED Provider Notes (Signed)
 [place] [facility] AT [facility] Provider Note   CSN: [id number] Arrival date & time: 03/08/24  2041     Patient presents with: Abdominal Pain   Manuel Mcguire is a 33 y.o. male.   33 year old male with a past medical history of sickle cell disease, cyclical vomiting presents to the ED with a chief complaint of sickle cell pain exacerbated for generalized abdominal pain for the past 2 days.  Patient reports in the past 2 days he has not been able to keep anything down.  His home pain regimen consist of oxycodone  10 mg every 4 hours, reports that in 2 days he has not had any.  According to records, he was evaluated due today, however he reports this was just for a follow-up.  He did try to take some Zofran  but reports there was no improvement in his symptoms.  He also reports nonbilious, nonbloody emesis for the past 2 days.  He was seen here at the end of last month for cyclical vomiting.  He does have a prior history of cholecystectomy.  Denies any fever, chest pain, shortness of breath.  The history is provided by the patient.  Abdominal Pain Pain location:  Generalized Pain quality: cramping   Pain radiates to:  Does not radiate Pain severity:  Moderate Onset quality:  Gradual Duration:  2 days Timing:  Constant Progression:  Worsening Chronicity:  Recurrent Context: alcohol use   Associated symptoms: nausea and vomiting   Associated symptoms: no chest pain, no chills, no constipation, no diarrhea, no fever and no shortness of breath        Prior to Admission medications   Medication Sig Start Date End Date Taking? Authorizing Provider  CALCIUM PO Take 1 tablet by mouth daily.    [provider]  cholecalciferol  (VITAMIN D3) 25 MCG (1000 UNIT) tablet Take 1,000 Units by mouth daily.    [provider]  folic acid  (FOLVITE ) 1 MG tablet Take 1 tablet (1 mg total) by mouth daily. 10/27/12   Alvia Rosaline LABOR, MD  hydroxyurea   (HYDREA ) 500 MG capsule Take 3 capsules (1,500 mg total) by mouth daily. May take with food to minimize GI side effects. 10/27/12   Alvia Rosaline LABOR, MD  lisinopril  (ZESTRIL ) 2.5 MG tablet Take 2.5 mg by mouth daily.    [provider]  metoCLOPramide  (REGLAN ) 10 MG tablet Take 1 tablet (10 mg total) by mouth 3 (three) times daily with meals. Patient taking differently: Take 10 mg by mouth 3 (three) times daily as needed for nausea or vomiting (take with food). 12/02/23 12/01/24  Ijaola, Onyeje M, NP  ondansetron  (ZOFRAN -ODT) 4 MG disintegrating tablet 4mg  ODT q4 hours prn nausea/vomit Patient taking differently: Take 4 mg by mouth every 4 (four) hours as needed for nausea or vomiting (dissolve orally). 11/06/23   Patt Alm Macho, MD  oxyCODONE  (OXY IR/ROXICODONE ) 5 MG immediate release tablet Take 1 tablet (5 mg total) by mouth every 6 (six) hours as needed for severe pain (pain score 7-10). 12/02/23   Ijaola, Onyeje M, NP  promethazine (PHENERGAN) 12.5 MG tablet Take 12.5 mg by mouth every 8 (eight) hours as needed for nausea or vomiting. 12/27/23   [provider]  pseudoephedrine (SUDAFED) 120 MG 12 hr tablet Take 120 mg by mouth every 12 (twelve) hours as needed for congestion.    [provider]    Allergies: Patient has no known allergies.    Review of Systems  Constitutional:  Negative for chills and fever.  Respiratory:  Negative for shortness of breath.   Cardiovascular:  Negative for chest pain.  Gastrointestinal:  Positive for abdominal pain, nausea and vomiting. Negative for blood in stool, constipation and diarrhea.  Genitourinary:  Negative for flank pain.  Musculoskeletal:  Positive for arthralgias. Negative for back pain.  All other systems reviewed and are negative.   Updated Vital Signs BP 106/63   Pulse (!) 101   Temp 98.1 F (36.7 C)   Resp 16   Ht 5' 7 (1.702 m)   Wt 79.4 kg   SpO2 96%   BMI 27.41 kg/m   Physical Exam Vitals and  nursing note reviewed.  Constitutional:      Appearance: He is well-developed.  HENT:     Head: Normocephalic and atraumatic.  Cardiovascular:     Rate and Rhythm: Normal rate.  Abdominal:     General: Abdomen is flat. Bowel sounds are normal.     Palpations: Abdomen is soft.     Tenderness: There is generalized abdominal tenderness. There is no right CVA tenderness, left CVA tenderness, guarding or rebound.  Skin:    General: Skin is warm and dry.  Neurological:     Mental Status: He is alert and oriented to person, place, and time.     (all labs ordered are listed, but only abnormal results are displayed) Labs Reviewed  CBC - Abnormal; Notable for the following components:      Result Value   WBC 12.0 (*)    RBC 2.38 (*)    Hemoglobin 8.9 (*)    HCT 24.2 (*)    MCV 101.7 (*)    MCH 37.4 (*)    MCHC 36.8 (*)    RDW 20.1 (*)    nRBC 2.2 (*)    All other components within normal limits  URINALYSIS, ROUTINE W REFLEX MICROSCOPIC - Abnormal; Notable for the following components:   Hgb urine dipstick TRACE (*)    Protein, ur >=300 (*)    All other components within normal limits  RETICULOCYTES - Abnormal; Notable for the following components:   Retic Ct Pct 4.5 (*)    RBC. 2.38 (*)    Immature Retic Fract 37.8 (*)    All other components within normal limits  URINALYSIS, MICROSCOPIC (REFLEX) - Abnormal; Notable for the following components:   Bacteria, UA RARE (*)    All other components within normal limits  COMPREHENSIVE METABOLIC PANEL WITH GFR - Abnormal; Notable for the following components:   Glucose, Bld 112 (*)    Total Protein 8.6 (*)    AST 52 (*)    Total Bilirubin 1.9 (*)    All other components within normal limits  URINE DRUG SCREEN  LIPASE, BLOOD    EKG: None  Radiology: No results found.   Procedures   Medications Ordered in the ED  sodium chloride  0.9 % bolus 1,000 mL (0 mLs Intravenous Stopped 03/08/24 2354)  ondansetron  (ZOFRAN ) injection  4 mg (4 mg Intravenous Given 03/08/24 2247)  HYDROmorphone  (DILAUDID ) injection 1 mg (1 mg Intravenous Given 03/08/24 2255)  diphenhydrAMINE  (BENADRYL ) injection 25 mg (25 mg Intravenous Given 03/08/24 2253)  HYDROmorphone  (DILAUDID ) injection 2 mg (2 mg Intravenous Given 03/08/24 2354)  HYDROmorphone  (DILAUDID ) injection 2 mg (2 mg Intravenous Given 03/09/24 0135)  ondansetron  (ZOFRAN ) injection 4 mg (4 mg Intravenous Given 03/09/24 0308)  oxyCODONE  (Oxy IR/ROXICODONE ) immediate release tablet 5 mg (5 mg Oral Given 03/09/24 0308)  Medical Decision Making Amount and/or Complexity of Data Reviewed Labs: ordered.  Risk Prescription drug management.   This patient presents to the ED for concern of abdominal pain, this involves a number of treatment options, and is a complaint that carries with it a high risk of complications and morbidity.  The differential diagnosis includes obstruction, cystitis, viral illness.   Co morbidities: Discussed in HPI   Brief History:  See HPI.  EMR reviewed including pt PMHx, past surgical history and past visits to ER.   See HPI for more details   Lab Tests:  I ordered and independently interpreted labs.  The pertinent results include:    CBC with a slight leukocytosis of 12.0, hemoglobin is decreased from his baseline.  CMP with no Electra derangement, current levels within normal limits.  LFTs remarkable for slight elevation of AST.  Lipase level is normal.  UA with trace of hemoglobin, greater than 300 protein but no nitrites or leukocytes to suggest infection.  Reticulocytes at his baseline.   Imaging Studies:  No imaging studies ordered for this patient  Medicines ordered:  I ordered medication including Dilaudid , Benadryl , Zofran , bolus for symptomatic treatment Reevaluation of the patient after these medicines showed that the patient improved I have reviewed the patients home medicines and have made  adjustments as needed  Reevaluation:  After the interventions noted above I re-evaluated patient and found that they have :improved   Social Determinants of Health:  The patient's social determinants of health were a factor in the care of this patient  Problem List / ED Course:  Patient presented to the ED with a chief complaint of abdominal pain, nausea, vomiting that has been ongoing for the past 2 days.  Underlying sickle cell, reports he is unable to take his pain regimen at home.  He has tried some Zofran  ODT, reports that he is unable to tolerate any p.o. intake.  Arrives to the ED actively vomiting, vitals remarkable for hypertension and slight elevation in his heart rate.  Has not been running any fevers, no suspicious food intake.  Does have a prior history of a cholecystectomy. According to review of records he was evaluated by [facility] today, did not voice this complaint at that time as he reports this was just a regular checkup. Blood work here is within his baseline aside from some slight decrease in his hemoglobin at 8.9.  He denies any rectal bleeding.  CMP with normal electrolytes.  UA without any signs of infection.  UDS without any illicit substance.  He was given 1 mg of Dilaudid  along with bolus reports no further vomiting however continues to have pain.  We discussed additional dose of Dilaudid  and reassessment. Abdomen remains soft, I do not feel that he warrants CT imaging at this time.   Dispostion:  Patient care signed out to incoming provider pending pain control and reassessment.    Portions of this note were generated with Scientist, clinical (histocompatibility and immunogenetics). Dictation errors may occur despite best attempts at proofreading.   Final diagnoses:  Sickle cell pain crisis [facility])    ED Discharge Orders     None          Tamar Miano, PA-C 03/11/24 1819    Lenor Hollering, MD 03/11/24 2201

## 2024-03-09 ENCOUNTER — Other Ambulatory Visit: Payer: Self-pay

## 2024-03-09 ENCOUNTER — Encounter (HOSPITAL_BASED_OUTPATIENT_CLINIC_OR_DEPARTMENT_OTHER): Payer: Self-pay | Admitting: Emergency Medicine

## 2024-03-09 DIAGNOSIS — R112 Nausea with vomiting, unspecified: Secondary | ICD-10-CM | POA: Insufficient documentation

## 2024-03-09 DIAGNOSIS — R1084 Generalized abdominal pain: Secondary | ICD-10-CM | POA: Insufficient documentation

## 2024-03-09 DIAGNOSIS — M79606 Pain in leg, unspecified: Secondary | ICD-10-CM | POA: Insufficient documentation

## 2024-03-09 MED ORDER — HYDROMORPHONE HCL 1 MG/ML IJ SOLN: 1.0000 mg | Freq: Once | INTRAMUSCULAR | Status: DC

## 2024-03-09 MED ORDER — ONDANSETRON HCL 4 MG/2ML IJ SOLN
4.0000 mg | Freq: Once | INTRAMUSCULAR | Status: AC
Start: 2024-03-09 — End: 2024-03-09
  Administered 2024-03-09: 4 mg via INTRAVENOUS
  Filled 2024-03-09: qty 2

## 2024-03-09 MED ORDER — OXYCODONE HCL 5 MG PO TABS
5.0000 mg | ORAL_TABLET | Freq: Once | ORAL | Status: AC
  Administered 2024-03-09: 5 mg via ORAL
  Filled 2024-03-09: qty 1

## 2024-03-09 MED ORDER — HYDROMORPHONE HCL 1 MG/ML IJ SOLN
2.0000 mg | Freq: Once | INTRAMUSCULAR | Status: AC
  Administered 2024-03-09: 2 mg via INTRAVENOUS
  Filled 2024-03-09: qty 2

## 2024-03-09 NOTE — Discharge Instructions (Addendum)
 Continue your medications as prescribed and follow-up with your doctor.  You declined admission to the hospital today.  Return to the ED with new or worsening symptoms

## 2024-03-09 NOTE — ED Triage Notes (Signed)
 Pt c/o emesis since being seen here yesterday, c/o ongoing sickle cell pain on lower back.   Pt requested to wait until roomed for lab work. Pt declined subQ injection of dilaudid .

## 2024-03-09 NOTE — ED Provider Notes (Signed)
 Care assumed at shift change.  Patient here with sickle cell pain involving back and legs.  Nausea and vomiting abdominal pain have resolved.  Vitals are stable.  Hemoglobin is at baseline.  Reticulocyte count is adequate. LFTs at baseline.  After several doses of pain medication patient is feeling improved and requesting discharge. He feels he can manage his pain at home.  Admission to the hospital offered but he declines.  He is tolerating p.o.  He will follow-up with his sickle cell team.  Return precautions discussed.   Carita Senior, MD 03/09/24 [phone]

## 2024-03-09 NOTE — ED Notes (Signed)
 Patient tolerated 1 cup ice water  and 1 ginger ale without vomiting. Patient requested 2nd ginger ale which was provided to patient.

## 2024-03-10 ENCOUNTER — Emergency Department (HOSPITAL_BASED_OUTPATIENT_CLINIC_OR_DEPARTMENT_OTHER)
Admission: EM | Admit: 2024-03-10 | Discharge: 2024-03-10 | Disposition: A | Discharge status: Home / Self Care | Source: Home / Self Care | Attending: Emergency Medicine | Admitting: Emergency Medicine

## 2024-03-10 DIAGNOSIS — D5700 Hb-SS disease with crisis, unspecified: Secondary | ICD-10-CM

## 2024-03-10 LAB — RETICULOCYTES
Immature Retic Fract: 42.7 % — ABNORMAL HIGH (ref 2.3–15.9)
RBC.: 2.38 MIL/uL — ABNORMAL LOW (ref 4.22–5.81)
Retic Count, Absolute: 133.3 K/uL (ref 19.0–186.0)
Retic Ct Pct: 5.6 % — ABNORMAL HIGH (ref 0.4–3.1)

## 2024-03-10 LAB — CBC WITH DIFFERENTIAL/PLATELET
Abs Immature Granulocytes: 0.08 K/uL — ABNORMAL HIGH (ref 0.00–0.07)
Basophils Absolute: 0.1 K/uL (ref 0.0–0.1)
Basophils Relative: 0 %
Eosinophils Absolute: 0.1 K/uL (ref 0.0–0.5)
Eosinophils Relative: 0 %
HCT: 25 % — ABNORMAL LOW (ref 39.0–52.0)
Hemoglobin: 9.2 g/dL — ABNORMAL LOW (ref 13.0–17.0)
Immature Granulocytes: 1 %
Lymphocytes Relative: 24 %
Lymphs Abs: 3.3 K/uL (ref 0.7–4.0)
MCH: 37.9 pg — ABNORMAL HIGH (ref 26.0–34.0)
MCHC: 36.8 g/dL — ABNORMAL HIGH (ref 30.0–36.0)
MCV: 102.9 fL — ABNORMAL HIGH (ref 80.0–100.0)
Monocytes Absolute: 1.4 K/uL — ABNORMAL HIGH (ref 0.1–1.0)
Monocytes Relative: 10 %
Neutro Abs: 9.3 K/uL — ABNORMAL HIGH (ref 1.7–7.7)
Neutrophils Relative %: 65 %
Platelets: 170 K/uL (ref 150–400)
RBC: 2.43 MIL/uL — ABNORMAL LOW (ref 4.22–5.81)
RDW: 20.5 % — ABNORMAL HIGH (ref 11.5–15.5)
Smear Review: NORMAL
WBC: 14.2 K/uL — ABNORMAL HIGH (ref 4.0–10.5)
nRBC: 4.4 % — ABNORMAL HIGH (ref 0.0–0.2)

## 2024-03-10 LAB — COMPREHENSIVE METABOLIC PANEL WITH GFR
ALT: 21 U/L (ref 0–44)
AST: 65 U/L — ABNORMAL HIGH (ref 15–41)
Albumin: 4.7 g/dL (ref 3.5–5.0)
Alkaline Phosphatase: 124 U/L (ref 38–126)
Anion gap: 13 (ref 5–15)
BUN: <5 mg/dL — ABNORMAL LOW (ref 6–20)
CO2: 21 mmol/L — ABNORMAL LOW (ref 22–32)
Calcium: 9.4 mg/dL (ref 8.9–10.3)
Chloride: 106 mmol/L (ref 98–111)
Creatinine, Ser: 0.79 mg/dL (ref 0.61–1.24)
GFR, Estimated: >60 mL/min (ref >60)
Glucose, Bld: 107 mg/dL — ABNORMAL HIGH (ref 70–99)
Potassium: 4.6 mmol/L (ref 3.5–5.1)
Sodium: 140 mmol/L (ref 135–145)
Total Bilirubin: 2.4 mg/dL — ABNORMAL HIGH (ref 0.0–1.2)
Total Protein: 9.2 g/dL — ABNORMAL HIGH (ref 6.5–8.1)

## 2024-03-10 MED ORDER — HYDROMORPHONE HCL 1 MG/ML IJ SOLN
2.0000 mg | Freq: Once | INTRAMUSCULAR | Status: AC
  Administered 2024-03-10: 2 mg via INTRAVENOUS
  Filled 2024-03-10: qty 2

## 2024-03-10 MED ORDER — ONDANSETRON HCL 4 MG/2ML IJ SOLN
4.0000 mg | Freq: Once | INTRAMUSCULAR | Status: AC
  Administered 2024-03-10: 4 mg via INTRAVENOUS
  Filled 2024-03-10: qty 2

## 2024-03-10 MED ORDER — SODIUM CHLORIDE 0.9 % IV BOLUS
1000.0000 mL | Freq: Once | INTRAVENOUS | Status: AC
  Administered 2024-03-10: 1000 mL via INTRAVENOUS

## 2024-03-10 NOTE — ED Provider Notes (Signed)
 [name] [facility] Provider Note   CSN: [id number] Arrival date & time: 03/09/24  2025     Patient presents with: Emesis and Sickle Cell Pain Crisis   Manuel Mcguire is a 33 y.o. male.   Patient is a 33 year old male with history of sickle cell disease, cyclic vomiting.  Patient presenting today with complaints of vomiting and back and leg pain.  Symptoms started earlier today.  He is unable to take his home medications due to the vomiting.  No fevers or chills.  He denies any diarrhea.  He reports generalized abdominal discomfort.       Prior to Admission medications   Medication Sig Start Date End Date Taking? Authorizing Provider  CALCIUM PO Take 1 tablet by mouth daily.    [provider]  cholecalciferol  (VITAMIN D3) 25 MCG (1000 UNIT) tablet Take 1,000 Units by mouth daily.    [provider]  folic acid  (FOLVITE ) 1 MG tablet Take 1 tablet (1 mg total) by mouth daily. 10/27/12   Alvia Rosaline LABOR, MD  hydroxyurea  (HYDREA ) 500 MG capsule Take 3 capsules (1,500 mg total) by mouth daily. May take with food to minimize GI side effects. 10/27/12   Alvia Rosaline LABOR, MD  lisinopril  (ZESTRIL ) 2.5 MG tablet Take 2.5 mg by mouth daily.    [provider]  metoCLOPramide  (REGLAN ) 10 MG tablet Take 1 tablet (10 mg total) by mouth 3 (three) times daily with meals. Patient taking differently: Take 10 mg by mouth 3 (three) times daily as needed for nausea or vomiting (take with food). 12/02/23 12/01/24  Ijaola, Onyeje M, NP  ondansetron  (ZOFRAN -ODT) 4 MG disintegrating tablet 4mg  ODT q4 hours prn nausea/vomit Patient taking differently: Take 4 mg by mouth every 4 (four) hours as needed for nausea or vomiting (dissolve orally). 11/06/23   Patt Alm Macho, MD  oxyCODONE  (OXY IR/ROXICODONE ) 5 MG immediate release tablet Take 1 tablet (5 mg total) by mouth every 6 (six) hours as needed for severe pain (pain score 7-10).  12/02/23   Ijaola, Onyeje M, NP  promethazine (PHENERGAN) 12.5 MG tablet Take 12.5 mg by mouth every 8 (eight) hours as needed for nausea or vomiting. 12/27/23   [provider]  pseudoephedrine (SUDAFED) 120 MG 12 hr tablet Take 120 mg by mouth every 12 (twelve) hours as needed for congestion.    [provider]    Allergies: Patient has no known allergies.    Review of Systems  All other systems reviewed and are negative.   Updated Vital Signs BP (!) 146/102 (BP Location: Right Arm)   Pulse 90   Temp 99.4 F (37.4 C) (Oral)   Resp 20   Ht 5' 7 (1.702 m)   Wt 79.4 kg   SpO2 99%   BMI 27.41 kg/m   Physical Exam Vitals and nursing note reviewed.  Constitutional:      General: He is not in acute distress.    Appearance: He is well-developed. He is not diaphoretic.  HENT:     Head: Normocephalic and atraumatic.  Cardiovascular:     Rate and Rhythm: Normal rate and regular rhythm.     Heart sounds: No murmur heard.    No friction rub.  Pulmonary:     Effort: Pulmonary effort is normal. No respiratory distress.     Breath sounds: Normal breath sounds. No wheezing or rales.  Abdominal:     General: Bowel sounds are normal. There is no  distension.     Palpations: Abdomen is soft.     Tenderness: There is no abdominal tenderness.  Musculoskeletal:        General: Normal range of motion.     Cervical back: Normal range of motion and neck supple.  Skin:    General: Skin is warm and dry.  Neurological:     Mental Status: He is alert and oriented to person, place, and time.     Coordination: Coordination normal.     (all labs ordered are listed, but only abnormal results are displayed) Labs Reviewed  COMPREHENSIVE METABOLIC PANEL WITH GFR  CBC WITH DIFFERENTIAL/PLATELET  RETICULOCYTES    EKG: None  Radiology: No results found.   Procedures   Medications Ordered in the ED  HYDROmorphone  (DILAUDID ) injection 1 mg (has no administration in time  range)  sodium chloride  0.9 % bolus 1,000 mL (has no administration in time range)  ondansetron  (ZOFRAN ) injection 4 mg (has no administration in time range)  HYDROmorphone  (DILAUDID ) injection 2 mg (has no administration in time range)                                    Medical Decision Making Amount and/or Complexity of Data Reviewed Labs: ordered.  Risk Prescription drug management.   Patient is a 33 year old male presenting with nausea and vomiting that is preventing him from taking his home medications for sickle cell disease.  He now believes he is in a sickle cell crisis because of this.  He arrives here with stable vital signs and is afebrile.  Physical examination basically unremarkable.  Laboratory studies obtained including CBC, metabolic panel, and reticulocyte count.  All studies basically at patient's baseline.  Patient has received several doses of Dilaudid  along with IV fluids and Zofran  and seems to be feeling better.  He feels as though he can go home.  He will be discharged with as needed follow-up.     Final diagnoses:  None    ED Discharge Orders     None          Geroldine Berg, MD 03/10/24 [phone]

## 2024-03-10 NOTE — Discharge Instructions (Signed)
 Continue medications as previously prescribed.  Follow-up with your primary doctor.

## 2024-03-11 ENCOUNTER — Other Ambulatory Visit: Payer: Self-pay

## 2024-03-11 ENCOUNTER — Encounter (HOSPITAL_BASED_OUTPATIENT_CLINIC_OR_DEPARTMENT_OTHER): Payer: Self-pay | Admitting: Emergency Medicine

## 2024-03-11 ENCOUNTER — Inpatient Hospital Stay (HOSPITAL_BASED_OUTPATIENT_CLINIC_OR_DEPARTMENT_OTHER)
Admission: EM | Admit: 2024-03-11 | Discharge: 2024-03-20 | DRG: 812 | Disposition: A | Discharge status: Home / Self Care | Attending: Internal Medicine | Admitting: Internal Medicine

## 2024-03-11 DIAGNOSIS — D72829 Elevated white blood cell count, unspecified: Secondary | ICD-10-CM | POA: Diagnosis present

## 2024-03-11 DIAGNOSIS — K3184 Gastroparesis: Secondary | ICD-10-CM | POA: Diagnosis present

## 2024-03-11 DIAGNOSIS — R112 Nausea with vomiting, unspecified: Secondary | ICD-10-CM | POA: Diagnosis present

## 2024-03-11 DIAGNOSIS — Z8249 Family history of ischemic heart disease and other diseases of the circulatory system: Secondary | ICD-10-CM

## 2024-03-11 DIAGNOSIS — Z79899 Other long term (current) drug therapy: Secondary | ICD-10-CM

## 2024-03-11 DIAGNOSIS — Z832 Family history of diseases of the blood and blood-forming organs and certain disorders involving the immune mechanism: Secondary | ICD-10-CM

## 2024-03-11 DIAGNOSIS — D638 Anemia in other chronic diseases classified elsewhere: Secondary | ICD-10-CM | POA: Diagnosis present

## 2024-03-11 DIAGNOSIS — I10 Essential (primary) hypertension: Secondary | ICD-10-CM | POA: Diagnosis present

## 2024-03-11 DIAGNOSIS — Z833 Family history of diabetes mellitus: Secondary | ICD-10-CM

## 2024-03-11 DIAGNOSIS — G894 Chronic pain syndrome: Secondary | ICD-10-CM | POA: Diagnosis present

## 2024-03-11 DIAGNOSIS — D5700 Hb-SS disease with crisis, unspecified: Principal | ICD-10-CM | POA: Diagnosis present

## 2024-03-11 LAB — COMPREHENSIVE METABOLIC PANEL WITH GFR
ALT: 17 U/L (ref 0–44)
AST: 33 U/L (ref 15–41)
Albumin: 4.4 g/dL (ref 3.5–5.0)
Alkaline Phosphatase: 109 U/L (ref 38–126)
Anion gap: 13 (ref 5–15)
BUN: <5 mg/dL — ABNORMAL LOW (ref 6–20)
CO2: 23 mmol/L (ref 22–32)
Calcium: 9.4 mg/dL (ref 8.9–10.3)
Chloride: 107 mmol/L (ref 98–111)
Creatinine, Ser: 0.96 mg/dL (ref 0.61–1.24)
GFR, Estimated: >60 mL/min (ref >60)
Glucose, Bld: 102 mg/dL — ABNORMAL HIGH (ref 70–99)
Potassium: 3.5 mmol/L (ref 3.5–5.1)
Sodium: 143 mmol/L (ref 135–145)
Total Bilirubin: 2.1 mg/dL — ABNORMAL HIGH (ref 0.0–1.2)
Total Protein: 8.4 g/dL — ABNORMAL HIGH (ref 6.5–8.1)

## 2024-03-11 LAB — CBC
HCT: 24.5 % — ABNORMAL LOW (ref 39.0–52.0)
Hemoglobin: 8.9 g/dL — ABNORMAL LOW (ref 13.0–17.0)
MCH: 37.7 pg — ABNORMAL HIGH (ref 26.0–34.0)
MCHC: 36.3 g/dL — ABNORMAL HIGH (ref 30.0–36.0)
MCV: 103.8 fL — ABNORMAL HIGH (ref 80.0–100.0)
Platelets: 196 K/uL (ref 150–400)
RBC: 2.36 MIL/uL — ABNORMAL LOW (ref 4.22–5.81)
RDW: 20.9 % — ABNORMAL HIGH (ref 11.5–15.5)
WBC: 14.1 K/uL — ABNORMAL HIGH (ref 4.0–10.5)
nRBC: 6.1 % — ABNORMAL HIGH (ref 0.0–0.2)

## 2024-03-11 LAB — URINALYSIS, ROUTINE W REFLEX MICROSCOPIC
Glucose, UA: NEGATIVE mg/dL
Ketones, ur: NEGATIVE mg/dL
Leukocytes,Ua: NEGATIVE
Nitrite: NEGATIVE
Protein, ur: >=300 mg/dL — AB
Specific Gravity, Urine: 1.02 (ref 1.005–1.030)
pH: 6.5 (ref 5.0–8.0)

## 2024-03-11 LAB — URINALYSIS, MICROSCOPIC (REFLEX)

## 2024-03-11 LAB — RETICULOCYTES
Immature Retic Fract: 45.7 % — ABNORMAL HIGH (ref 2.3–15.9)
RBC.: 2.36 MIL/uL — ABNORMAL LOW (ref 4.22–5.81)
Retic Count, Absolute: 188.1 K/uL — ABNORMAL HIGH (ref 19.0–186.0)
Retic Ct Pct: 8 % — ABNORMAL HIGH (ref 0.4–3.1)

## 2024-03-11 LAB — LIPASE, BLOOD: Lipase: 19 U/L (ref 11–51)

## 2024-03-11 MED ORDER — DIPHENHYDRAMINE HCL 50 MG/ML IJ SOLN
INTRAMUSCULAR | Status: AC
  Filled 2024-03-11: qty 1

## 2024-03-11 MED ORDER — HYDROMORPHONE HCL 1 MG/ML IJ SOLN
2.0000 mg | INTRAMUSCULAR | Status: AC
  Administered 2024-03-11: 2 mg via INTRAVENOUS
  Filled 2024-03-11: qty 2

## 2024-03-11 MED ORDER — DEXTROSE-SODIUM CHLORIDE 5-0.45 % IV SOLN: INTRAVENOUS | Status: DC

## 2024-03-11 MED ORDER — ONDANSETRON HCL 4 MG/2ML IJ SOLN
4.0000 mg | INTRAMUSCULAR | Status: DC | PRN
  Administered 2024-03-11 – 2024-03-12 (×2): 4 mg via INTRAVENOUS
  Filled 2024-03-11 (×2): qty 2

## 2024-03-11 MED ORDER — HYDROMORPHONE HCL 1 MG/ML IJ SOLN
2.0000 mg | Freq: Once | INTRAMUSCULAR | Status: AC
  Administered 2024-03-11: 2 mg via INTRAVENOUS
  Filled 2024-03-11: qty 2

## 2024-03-11 MED ORDER — FAMOTIDINE IN NACL 20-0.9 MG/50ML-% IV SOLN
20.0000 mg | Freq: Once | INTRAVENOUS | Status: AC
Start: 2024-03-11 — End: 2024-03-12
  Administered 2024-03-11: 20 mg via INTRAVENOUS
  Filled 2024-03-11: qty 50

## 2024-03-11 MED ORDER — SODIUM CHLORIDE 0.9 % IV SOLN
12.5000 mg | Freq: Once | INTRAVENOUS | Status: AC
  Administered 2024-03-11: 12.5 mg via INTRAVENOUS
  Filled 2024-03-11: qty 0.25

## 2024-03-11 NOTE — ED Triage Notes (Signed)
 Pt c/o back pain d/t Sickle Cell Crisis and NV; seen yesterday for same

## 2024-03-11 NOTE — ED Provider Notes (Signed)
 [place] [facility] Provider Note   CSN: [id number] Arrival date & time: 03/11/24  1950     Patient presents with: Emesis, Back Pain, and Sickle Cell Pain Crisis   Manuel Mcguire is a 33 y.o. male.   HPI Patient reports that he started vomiting this morning again.  He reports it has been recurrent and he has had yellow emesis.  He is trying to take his medications for gastroparesis but he is not able to hold them down and he is also getting a lot of epigastric pain.  He reports he has not been able to eat.  No fever.  No shortness of breath.  No lower extremity swelling or calf pain.    Prior to Admission medications   Medication Sig Start Date End Date Taking? Authorizing Provider  CALCIUM PO Take 1 tablet by mouth daily.    Manuel Mcguire  cholecalciferol  (VITAMIN D3) 25 MCG (1000 UNIT) tablet Take 1,000 Units by mouth daily.    Manuel Mcguire  folic acid  (FOLVITE ) 1 MG tablet Take 1 tablet (1 mg total) by mouth daily. 10/27/12   Manuel Rosaline LABOR, Mcguire  hydroxyurea  (HYDREA ) 500 MG capsule Take 3 capsules (1,500 mg total) by mouth daily. May take with food to minimize GI side effects. 10/27/12   Manuel Rosaline LABOR, Mcguire  lisinopril  (ZESTRIL ) 2.5 MG tablet Take 2.5 mg by mouth daily.    Manuel Mcguire  metoCLOPramide  (REGLAN ) 10 MG tablet Take 1 tablet (10 mg total) by mouth 3 (three) times daily with meals. Patient taking differently: Take 10 mg by mouth 3 (three) times daily as needed for nausea or vomiting (take with food). 12/02/23 12/01/24  Ijaola, Onyeje M, NP  ondansetron  (ZOFRAN -ODT) 4 MG disintegrating tablet 4mg  ODT q4 hours prn nausea/vomit Patient taking differently: Take 4 mg by mouth every 4 (four) hours as needed for nausea or vomiting (dissolve orally). 11/06/23   Manuel Alm Macho, Mcguire  oxyCODONE  (OXY IR/ROXICODONE ) 5 MG immediate release tablet Take 1 tablet (5 mg total) by mouth every 6 (six) hours as  needed for severe pain (pain score 7-10). 12/02/23   Ijaola, Onyeje M, NP  promethazine (PHENERGAN) 12.5 MG tablet Take 12.5 mg by mouth every 8 (eight) hours as needed for nausea or vomiting. 12/27/23   Manuel Mcguire  pseudoephedrine (SUDAFED) 120 MG 12 hr tablet Take 120 mg by mouth every 12 (twelve) hours as needed for congestion.    Manuel Mcguire    Allergies: Patient has no known allergies.    Review of Systems  Updated Vital Signs BP 117/75   Pulse 85   Temp 98.8 F (37.1 C) (Oral)   Resp 18   Ht 5' 7 (1.702 Mcguire)   Wt 79.4 kg   SpO2 93%   BMI 27.41 kg/Mcguire   Physical Exam Constitutional:      Comments: Patient is alert.  He is having some dry heaves.  No respiratory distress.  Mental status clear.  He has an emesis bag with moderate amount of thin yellow emesis present.  HENT:     Mouth/Throat:     Pharynx: Oropharynx is clear.  Eyes:     Extraocular Movements: Extraocular movements intact.  Cardiovascular:     Rate and Rhythm: Normal rate and regular rhythm.  Pulmonary:     Effort: Pulmonary effort is normal.     Breath sounds: Normal breath sounds.  Abdominal:     General: There  is no distension.     Palpations: Abdomen is soft.     Tenderness: There is no abdominal tenderness. There is no guarding.  Musculoskeletal:        General: No swelling or tenderness. Normal range of motion.     Right lower leg: No edema.     Left lower leg: No edema.  Skin:    General: Skin is warm and dry.  Neurological:     General: No focal deficit present.     Mental Status: He is oriented to person, place, and time.     Motor: No weakness.     Coordination: Coordination normal.  Psychiatric:        Mood and Affect: Mood normal.     (all labs ordered are listed, but only abnormal results are displayed) Labs Reviewed  COMPREHENSIVE METABOLIC PANEL WITH GFR - Abnormal; Notable for the following components:      Result Value   Glucose, Bld 102 (*)    BUN <5  (*)    Total Protein 8.4 (*)    Total Bilirubin 2.1 (*)    All other components within normal limits  CBC - Abnormal; Notable for the following components:   WBC 14.1 (*)    RBC 2.36 (*)    Hemoglobin 8.9 (*)    HCT 24.5 (*)    MCV 103.8 (*)    MCH 37.7 (*)    MCHC 36.3 (*)    RDW 20.9 (*)    nRBC 6.1 (*)    All other components within normal limits  URINALYSIS, ROUTINE W REFLEX MICROSCOPIC - Abnormal; Notable for the following components:   Hgb urine dipstick SMALL (*)    Bilirubin Urine SMALL (*)    Protein, ur >=300 (*)    All other components within normal limits  RETICULOCYTES - Abnormal; Notable for the following components:   Retic Ct Pct 8.0 (*)    RBC. 2.36 (*)    Retic Count, Absolute 188.1 (*)    Immature Retic Fract 45.7 (*)    All other components within normal limits  URINALYSIS, MICROSCOPIC (REFLEX) - Abnormal; Notable for the following components:   Bacteria, UA RARE (*)    All other components within normal limits  LIPASE, BLOOD    EKG: None  Radiology: No results found.   Procedures   Medications Ordered in the ED  dextrose  5 % and 0.45 % NaCl infusion ( Intravenous New Bag/Given 03/11/24 2151)  ondansetron  (ZOFRAN ) injection 4 mg (4 mg Intravenous Given 03/11/24 2144)  diphenhydrAMINE  (BENADRYL ) 50 MG/ML injection (50 mg  Not Given 03/11/24 2149)  HYDROmorphone  (DILAUDID ) injection 2 mg (2 mg Intravenous Given 03/11/24 2146)  HYDROmorphone  (DILAUDID ) injection 2 mg (2 mg Intravenous Given 03/11/24 2222)  HYDROmorphone  (DILAUDID ) injection 2 mg (2 mg Intravenous Given 03/11/24 2251)  diphenhydrAMINE  (BENADRYL ) 12.5 mg in sodium chloride  0.9 % 50 mL IVPB (0 mg Intravenous Stopped 03/11/24 2321)  famotidine  (PEPCID ) IVPB 20 mg premix (20 mg Intravenous New Bag/Given 03/11/24 2324)  HYDROmorphone  (DILAUDID ) injection 2 mg (2 mg Intravenous Given 03/11/24 2323)                                    Medical Decision Making Amount and/or Complexity of Data  Reviewed Labs: ordered.  Risk Prescription drug management. Decision regarding hospitalization.  Patient presents with history of recurrent gastroparesis and sickle cell pain.  He has had  multiple episodes and over the past several days recurrence despite getting treatment in the emergency department.  Will proceed with lab work and treatment for pain and nausea with rehydration.  Patient's abdomen is soft.  I do not think he needs any repeat imaging at this time.  22: 58 recheck patient reports that the nausea is better.  He is now taking in fluids orally.  He reports pain is improved but he still that epigastric pain.  Will give additional dose of nausea medication and pain medication with Pepcid .  Recheck 2330 patient reports he started vomiting again and is having worsening epigastric pain.  He feels very nauseated.  Patient was treated for combination of pain with history of sickle cell crisis and gastroparesis.  Patient has been rehydrated and initially had improved response.  However after trying oral challenge, patient developed recurrence of pain and nausea.  At this point we will plan for admission.  Consult: Reviewed Dr. Franky for admission.     Final diagnoses:  Sickle cell crisis [facility])  Gastroparesis    ED Discharge Orders     None          Armenta Canning, Mcguire 03/11/24 2358

## 2024-03-12 ENCOUNTER — Emergency Department (HOSPITAL_BASED_OUTPATIENT_CLINIC_OR_DEPARTMENT_OTHER)

## 2024-03-12 DIAGNOSIS — Z833 Family history of diabetes mellitus: Secondary | ICD-10-CM | POA: Diagnosis not present

## 2024-03-12 DIAGNOSIS — I10 Essential (primary) hypertension: Secondary | ICD-10-CM | POA: Diagnosis not present

## 2024-03-12 DIAGNOSIS — D638 Anemia in other chronic diseases classified elsewhere: Secondary | ICD-10-CM | POA: Diagnosis not present

## 2024-03-12 DIAGNOSIS — Z832 Family history of diseases of the blood and blood-forming organs and certain disorders involving the immune mechanism: Secondary | ICD-10-CM | POA: Diagnosis not present

## 2024-03-12 DIAGNOSIS — K3184 Gastroparesis: Secondary | ICD-10-CM | POA: Diagnosis not present

## 2024-03-12 DIAGNOSIS — Z8249 Family history of ischemic heart disease and other diseases of the circulatory system: Secondary | ICD-10-CM | POA: Diagnosis not present

## 2024-03-12 DIAGNOSIS — D5700 Hb-SS disease with crisis, unspecified: Secondary | ICD-10-CM | POA: Diagnosis not present

## 2024-03-12 DIAGNOSIS — Z79899 Other long term (current) drug therapy: Secondary | ICD-10-CM | POA: Diagnosis not present

## 2024-03-12 DIAGNOSIS — G894 Chronic pain syndrome: Secondary | ICD-10-CM | POA: Diagnosis not present

## 2024-03-12 MED ORDER — HYDROXYUREA 500 MG PO CAPS
1500.0000 mg | ORAL_CAPSULE | Freq: Every day | ORAL | Status: DC
  Administered 2024-03-12 – 2024-03-20 (×9): 1500 mg via ORAL
  Filled 2024-03-12 (×9): qty 3

## 2024-03-12 MED ORDER — ENOXAPARIN SODIUM 40 MG/0.4ML IJ SOSY
40.0000 mg | PREFILLED_SYRINGE | INTRAMUSCULAR | Status: DC
  Administered 2024-03-12 – 2024-03-19 (×8): 40 mg via SUBCUTANEOUS
  Filled 2024-03-12 (×8): qty 0.4

## 2024-03-12 MED ORDER — FOLIC ACID 1 MG PO TABS
1.0000 mg | ORAL_TABLET | Freq: Every day | ORAL | Status: DC
  Administered 2024-03-12 – 2024-03-20 (×9): 1 mg via ORAL
  Filled 2024-03-12 (×9): qty 1

## 2024-03-12 MED ORDER — NALOXONE HCL 0.4 MG/ML IJ SOLN: 0.4000 mg | INTRAMUSCULAR | Status: DC | PRN

## 2024-03-12 MED ORDER — DIPHENHYDRAMINE HCL 25 MG PO CAPS
25.0000 mg | ORAL_CAPSULE | ORAL | Status: DC | PRN
  Administered 2024-03-18: 25 mg via ORAL
  Filled 2024-03-12: qty 1

## 2024-03-12 MED ORDER — HYDROMORPHONE HCL 1 MG/ML IJ SOLN
2.0000 mg | Freq: Once | INTRAMUSCULAR | Status: AC
  Administered 2024-03-12: 2 mg via INTRAVENOUS
  Filled 2024-03-12: qty 2

## 2024-03-12 MED ORDER — SODIUM CHLORIDE 0.45 % IV SOLN: INTRAVENOUS | Status: AC

## 2024-03-12 MED ORDER — ONDANSETRON HCL 4 MG/2ML IJ SOLN
4.0000 mg | Freq: Four times a day (QID) | INTRAMUSCULAR | Status: DC | PRN
  Administered 2024-03-14 – 2024-03-20 (×8): 4 mg via INTRAVENOUS
  Filled 2024-03-12 (×8): qty 2

## 2024-03-12 MED ORDER — OXYCODONE HCL 5 MG PO TABS
5.0000 mg | ORAL_TABLET | Freq: Four times a day (QID) | ORAL | Status: DC | PRN
  Administered 2024-03-12 – 2024-03-17 (×18): 5 mg via ORAL
  Filled 2024-03-12 (×17): qty 1

## 2024-03-12 MED ORDER — SODIUM CHLORIDE 0.9% FLUSH: 9.0000 mL | INTRAVENOUS | Status: DC | PRN

## 2024-03-12 MED ORDER — HYDROMORPHONE 1 MG/ML IV SOLN
INTRAVENOUS | Status: DC
  Administered 2024-03-12 – 2024-03-18 (×6): 30 mg via INTRAVENOUS
  Administered 2024-03-18: 8 mg via INTRAVENOUS
  Filled 2024-03-12 (×7): qty 30

## 2024-03-12 NOTE — Plan of Care (Signed)

## 2024-03-12 NOTE — H&P (Signed)
 H&P  Patient Demographics:  Manuel Mcguire, is a 33 y.o. male  MRN: [id number]   DOB - [birth year]  Admit Date - 03/11/2024  Outpatient Primary MD for the patient is Dep, Karolynn Batters, NP  Chief Complaint  Patient presents with   Emesis   Back Pain   Sickle Cell Pain Crisis      HPI:   Manuel Mcguire  is a 33 y.o. male with a medical history significant for sickle cell disease, essential hypertension, status post lap chole with intraoperative cholangiogram on 11/22/2023, and anemia of chronic disease presented to the emergency department with complaints of vomiting and back and leg pain. Symptoms started earlier today. He is unable to take his home medications due to the vomiting. No fevers or chills. He denies any diarrhea, fever, cough,  He reports generalized abdominal discomfort. No urinary symptoms.   ED COURSE:  BP 117/75   Pulse 85   Temp 98.8 F (37.1 C) (Oral)   Resp 18   Ht 5' 7 (1.702 m)   Wt 79.4 kg   SpO2 93%   BMI 27.41 kg/m     WBC 14.1 (*)       RBC 2.36 (*)      Hemoglobin 8.9 (*)      HCT 24.5 (*)      MCV 103.8 (*)      MCH 37.7 (*)      MCHC 36.3 (*)      RDW 20.9 (*)      nRBC 6.1 (*)    In the emergency department patient was treated with IVF, IV pain medication , CT: Negative abdominal radiographs.  No acute cardiopulmonary disease.  patient admitted for ongoing sickle cell pain management.    Review of systems:  In addition to the HPI above, patient reports No fever or chills No Headache, No changes with vision or hearing No problems swallowing food or liquids No chest pain, cough or shortness of breath No blood in stool or urine No dysuria No new skin rashes or bruises No new joints pains-aches No new weakness, tingling, numbness in any extremity No recent weight gain or loss No polyuria, polydypsia or polyphagia No significant Mental Stressors  A full 10 point Review of Systems was done, except as stated above, all other Review  of Systems were negative.  With Past History of the following :   Past Medical History:  Diagnosis Date   Scoliosis    Sickle cell anemia with pain (HCC)       Past Surgical History:  Procedure Laterality Date   BACK SURGERY     unaware of particular surgery   CHOLECYSTECTOMY N/A 11/22/2023   Procedure: LAPAROSCOPIC CHOLECYSTECTOMY WITH INTRAOPERATIVE CHOLANGIOGRAM;  Surgeon: Signe Mitzie LABOR, MD;  Location: [facility];  Service: General;  Laterality: N/A;  Poss IOC, ICG   HIP PINNING Left    Pt unaware of particulars     Social History:   Social History   Tobacco Use   Smoking status: Never   Smokeless tobacco: Not on file  Substance Use Topics   Alcohol use: No     Lives - At home   Family History :   Family History  Problem Relation Age of Onset   Diabetes Mother    Hypertension Mother    Sickle cell anemia Sister      Home Medications:   Prior to Admission medications   Medication Sig Start Date End Date Taking? Authorizing Provider  CALCIUM PO  Take 1 tablet by mouth daily.    [provider]  cholecalciferol  (VITAMIN D3) 25 MCG (1000 UNIT) tablet Take 1,000 Units by mouth daily.    [provider]  folic acid  (FOLVITE ) 1 MG tablet Take 1 tablet (1 mg total) by mouth daily. 10/27/12   Alvia Rosaline LABOR, MD  hydroxyurea  (HYDREA ) 500 MG capsule Take 3 capsules (1,500 mg total) by mouth daily. May take with food to minimize GI side effects. 10/27/12   Alvia Rosaline LABOR, MD  lisinopril  (ZESTRIL ) 2.5 MG tablet Take 2.5 mg by mouth daily.    [provider]  metoCLOPramide  (REGLAN ) 10 MG tablet Take 1 tablet (10 mg total) by mouth 3 (three) times daily with meals. Patient taking differently: Take 10 mg by mouth 3 (three) times daily as needed for nausea or vomiting (take with food). 12/02/23 12/01/24  Tzippy Testerman M, NP  ondansetron  (ZOFRAN -ODT) 4 MG disintegrating tablet 4mg  ODT q4 hours prn nausea/vomit Patient taking differently: Take  4 mg by mouth every 4 (four) hours as needed for nausea or vomiting (dissolve orally). 11/06/23   Patt Alm Macho, MD  oxyCODONE  (OXY IR/ROXICODONE ) 5 MG immediate release tablet Take 1 tablet (5 mg total) by mouth every 6 (six) hours as needed for severe pain (pain score 7-10). 12/02/23   Alston Berrie M, NP  promethazine (PHENERGAN) 12.5 MG tablet Take 12.5 mg by mouth every 8 (eight) hours as needed for nausea or vomiting. 12/27/23   [provider]  pseudoephedrine (SUDAFED) 120 MG 12 hr tablet Take 120 mg by mouth every 12 (twelve) hours as needed for congestion.    [provider]     Allergies:   No Known Allergies   Physical Exam:   Vitals:   Vitals:   03/12/24 1135 03/12/24 1435  BP:  129/83  Pulse:  77  Resp: 16   Temp:  98.1 F (36.7 C)  SpO2: 100% 100%    Physical Exam: Constitutional: Patient appears well-developed and well-nourished. Not in obvious distress. HENT: Normocephalic, atraumatic, External right and left ear normal. Oropharynx is clear and moist.  Eyes: Conjunctivae and EOM are normal. PERRLA, no scleral icterus. Neck: Normal ROM. Neck supple. No JVD. No tracheal deviation. No thyromegaly. CVS: RRR, S1/S2 +, no murmurs, no gallops, no carotid bruit.  Pulmonary: Effort and breath sounds normal, no stridor, rhonchi, wheezes, rales.  Abdominal: N/V, abdominal tenderness Musculoskeletal: Generalize body tenderness.  Lymphadenopathy: No lymphadenopathy noted, cervical, inguinal or axillary Neuro: Alert. Normal reflexes, muscle tone coordination. No cranial nerve deficit. Skin: Skin is warm and dry. No rash noted. Not diaphoretic. No erythema. No pallor. Psychiatric: Normal mood and affect. Behavior, judgment, thought content normal.   Data Review:   CBC Recent Labs  Lab 03/08/24 2055 03/10/24 0036 03/11/24 2022  WBC 12.0* 14.2* 14.1*  HGB 8.9* 9.2* 8.9*  HCT 24.2* 25.0* 24.5*  PLT 177 170 196  MCV 101.7* 102.9* 103.8*  MCH 37.4*  37.9* 37.7*  MCHC 36.8* 36.8* 36.3*  RDW 20.1* 20.5* 20.9*  LYMPHSABS  --  3.3  --   MONOABS  --  1.4*  --   EOSABS  --  0.1  --   BASOSABS  --  0.1  --    ------------------------------------------------------------------------------------------------------------------  Chemistries  Recent Labs  Lab 03/08/24 2230 03/10/24 0036 03/11/24 2022  NA 142 140 143  K 4.4 4.6 3.5  CL 108 106 107  CO2 23 21* 23  GLUCOSE 112* 107* 102*  BUN 7 <  5* <5*  CREATININE 0.78 0.79 0.96  CALCIUM 9.3 9.4 9.4  AST 52* 65* 33  ALT 20 21 17   ALKPHOS 120 124 109  BILITOT 1.9* 2.4* 2.1*   ------------------------------------------------------------------------------------------------------------------ estimated creatinine clearance is 109.5 mL/min (by C-G formula based on SCr of 0.96 mg/dL). ------------------------------------------------------------------------------------------------------------------ No results for input(s): TSH, T4TOTAL, T3FREE, THYROIDAB in the last 72 hours.  Invalid input(s): FREET3  Coagulation profile No results for input(s): INR, PROTIME in the last 168 hours. ------------------------------------------------------------------------------------------------------------------- No results for input(s): DDIMER in the last 72 hours. -------------------------------------------------------------------------------------------------------------------  Cardiac Enzymes No results for input(s): CKMB, TROPONINI, MYOGLOBIN in the last 168 hours.  Invalid input(s): CK ------------------------------------------------------------------------------------------------------------------ No results found for: BNP  ---------------------------------------------------------------------------------------------------------------  Urinalysis    Component Value Date/Time   COLORURINE YELLOW 03/11/2024 2033   APPEARANCEUR CLEAR 03/11/2024 2033   LABSPEC 1.020  03/11/2024 2033   PHURINE 6.5 03/11/2024 2033   GLUCOSEU NEGATIVE 03/11/2024 2033   HGBUR SMALL (A) 03/11/2024 2033   BILIRUBINUR SMALL (A) 03/11/2024 2033   KETONESUR NEGATIVE 03/11/2024 2033   PROTEINUR >=300 (A) 03/11/2024 2033   UROBILINOGEN 1.0 10/27/2010 0630   NITRITE NEGATIVE 03/11/2024 2033   LEUKOCYTESUR NEGATIVE 03/11/2024 2033    ----------------------------------------------------------------------------------------------------------------   Imaging Results:    DG ABD ACUTE 2+V W 1V CHEST Result Date: 03/12/2024 CLINICAL DATA:  [id number]. Nausea and vomiting. Sickle cell pain crisis. Back pain and abdominal pain. EXAM: DG ABDOMEN ACUTE WITH 1 VIEW CHEST COMPARISON:  CT abdomen and pelvis with contrast 01/14/2024 and portable chest 01/14/2024. FINDINGS: There is no evidence of dilated bowel loops or free intraperitoneal air. There is mild fecal stasis. No radiopaque calculi or other significant radiographic abnormality is seen. There are cholecystectomy clips. Four threaded left hip pins. Heart size and mediastinal contours are within normal limits. Both lungs are clear. Thoracic spine fusion hardware is again noted with upper thoracic levoscoliosis. IMPRESSION: Negative abdominal radiographs.  No acute cardiopulmonary disease. Electronically Signed   By: Francis Quam M.D.   On: 03/12/2024 01:02     Assessment & Plan:  Principal Problem:   Sickle cell pain crisis (HCC) Active Problems:   Nausea vomiting and diarrhea   Anemia of chronic disease   Leukocytosis   Chronic pain syndrome   Hb Sickle Cell Disease with crisis: Admit patient, start IVF 0.45% Saline @ 75 mls/hour, start Dilaudid  PCA, Restart oral home pain medications, Monitor vitals very closely, Re-evaluate pain scale regularly, 2 L of Oxygen by [place], Patient will be re-evaluated for pain in the context of function and relationship to baseline as care progresses. Nausea Vomiting:CT: Negative abdominal radiographs.  Continue Zofran  as prescribed.  Leukocytosis: Elevated. No current signs or symptoms of infection , will monitor without antibiotics.  Anemia of Chronic Disease: Hgb at patients baseline Chronic pain Syndrome: continue oral home pain medication    DVT Prophylaxis: Subcut Lovenox    AM Labs Ordered, also please review Full Orders  Family Communication: Admission, patient's condition and plan of care including tests being ordered have been discussed with the patient who indicate understanding and agree with the plan and Code Status.  Code Status: Full Code  Consults called: None    Admission status: Inpatient    Time spent in minutes : 50 minutes  [name] Cover NP  03/12/2024 at 2:49 PM

## 2024-03-13 NOTE — TOC Initial Note (Signed)
 Transition of Care [facility]) - Initial/Assessment Note    Patient Details  Name: Manuel Mcguire MRN: [id number] Date of Birth: [birth year]  Transition of Care [facility]) CM/SW Contact:    Doneta Glenys DASEN, RN Phone Number: 03/13/2024, 3:26 PM  Clinical Narrative:                 Presented for Sickle cell Crisis.PTA he verified insurance/PCP; [name] denied pt experiencing SDOH risks; pt does not have DME, HH, home oxygen and SDOH needs.Patients father will transport at discharge. No inpatient care management needs identified. Place consult if needs present.  Expected Discharge Plan: Home/Self Care Barriers to Discharge: No Barriers Identified   Patient Goals and CMS Choice Patient states their goals for this hospitalization and ongoing recovery are:: Home with father CMS Medicare.gov Compare Post Acute Care list provided to::  (NA) Choice offered to / list presented to : NA [place] ownership interest in [facility].provided to:: Parent NA    Expected Discharge Plan and Services In-house Referral: NA Discharge Planning Services: CM Consult   Living arrangements for the past 2 months: Single Family Home                 DME Arranged: N/A DME Agency: NA       HH Arranged: NA HH Agency: NA        Prior Living Arrangements/Services Living arrangements for the past 2 months: Single Family Home Lives with:: Parents Patient language and need for interpreter reviewed:: Yes Do you feel safe going back to the place where you live?: Yes      Need for Family Participation in Patient Care: Yes (Comment) Care giver support system in place?: Yes (comment) Current home services:  (NA) Criminal Activity/Legal Involvement Pertinent to Current Situation/Hospitalization: No - Comment as needed  Activities of Daily Living   ADL Screening (condition at time of admission) Independently performs ADLs?: Yes (appropriate for developmental age) Is the patient deaf or have  difficulty hearing?: No Does the patient have difficulty seeing, even when wearing glasses/contacts?: No Does the patient have difficulty concentrating, remembering, or making decisions?: No  Permission Sought/Granted Permission sought to share information with : Case Manager Permission granted to share information with : Yes, Verbal Permission Granted  Share Information with NAME: Manuel, Mcguire (Father)  [phone]           Emotional Assessment Appearance:: Appears stated age Attitude/Demeanor/Rapport: Sedated Affect (typically observed): Appropriate Orientation: : Oriented to Self, Oriented to Place, Oriented to  Time, Oriented to Situation Alcohol / Substance Use: Not Applicable Psych Involvement: No (comment)  Admission diagnosis:  Gastroparesis [K31.84] Sickle cell crisis (HCC) [D57.00] Sickle cell pain crisis (HCC) [D57.00] Patient Active Problem List   Diagnosis Date Noted   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 11/30/2023   Abdominal pain 11/26/2023   Cholelithiasis 11/25/2023   Constipation 11/23/2023   Pruritus 11/18/2023   Sickle cell anemia with pain (HCC) 11/16/2023   Anemia of chronic disease 11/16/2023   Leukocytosis 11/16/2023   Chronic pain syndrome 11/16/2023   Nausea vomiting and diarrhea 08/26/2023   Sickle cell pain crisis (HCC) 02/05/2023   Acute respiratory failure with hypoxia (HCC) 02/05/2023   CAP (community acquired pneumonia) 02/05/2023   Sickle cell crisis (HCC) 10/27/2012   PCP:  Irean Karolynn Batters, NP Pharmacy:   [facility] [phone] [address], [place] - [address] [place] [address] Phone: [phone] Fax: [phone]     Social Drivers of Health (  SDOH) Social History: SDOH Screenings   Food Insecurity: No Food Insecurity (03/12/2024)  Housing: High Risk (03/12/2024)  Transportation Needs: No Transportation Needs (03/12/2024)  Utilities: Not At Risk (03/12/2024)   Financial Resource Strain: Low Risk  (02/01/2024)   Received from [facility]  Social Connections: Moderately Isolated (01/21/2024)  Tobacco Use: Unknown (03/11/2024)  Recent Concern: Tobacco Use - High Risk (03/08/2024)   Received from [facility]   SDOH Interventions:     Readmission Risk Interventions    03/13/2024    3:23 PM 01/16/2024    9:53 AM 12/10/2023    4:08 PM  Readmission Risk Prevention Plan  Transportation Screening Complete Complete Complete  PCP or Specialist Appt within 3-5 Days   Complete  HRI or Home Care Consult   Complete  Social Work Consult for Recovery Care Planning/Counseling   Complete  Palliative Care Screening   Not Applicable  Medication Review Oceanographer) Complete Complete Complete  PCP or Specialist appointment within 3-5 days of discharge Complete Complete   HRI or Home Care Consult Complete Complete   SW Recovery Care/Counseling Consult Complete Complete   Palliative Care Screening Not Applicable Not Applicable   Skilled Nursing Facility Not Applicable Not Applicable

## 2024-03-13 NOTE — Progress Notes (Signed)
 Patient ID: Manuel Mcguire, male   DOB: 23-Oct-1990, 33 y.o.   MRN: [id number] Subjective: Manuel Mcguire  is a 33 y.o. male with a medical history significant for sickle cell disease, essential hypertension, status post lap chole with intraoperative cholangiogram on 11/22/2023, and anemia of chronic disease presented to the emergency department with complaints of vomiting,  back and leg pain.   This morning patient is sitting up in bed, he reports that nausea is gradually resolving, he endorses pain of 7/10. Denies subjective fever, Diarrhea. No urinary symptoms.   Objective:  Vital signs in last 24 hours:  Vitals:   03/13/24 0345 03/13/24 0346 03/13/24 0802 03/13/24 0804  BP:  (!) 120/90 129/86   Pulse:  66 79   Resp: 13   19  Temp:  98.2 F (36.8 C) 97.7 F (36.5 C)   TempSrc:   Oral   SpO2: 99% 100% 100%   Weight:      Height:        Intake/Output from previous day:   Intake/Output Summary (Last 24 hours) at 03/13/2024 0858 Last data filed at 03/13/2024 0804 Gross per 24 hour  Intake 830 ml  Output 875 ml  Net -45 ml    Physical Exam: General: Alert, awake, oriented x3, in no acute distress.  HEENT: [place]/AT PEERL, EOMI Neck: Trachea midline,  no masses, no thyromegal,y no JVD, no carotid bruit OROPHARYNX:  Moist, No exudate/ erythema/lesions.  Heart: Regular rate and rhythm, without murmurs, rubs, gallops, PMI non-displaced, no heaves or thrills on palpation.  Lungs: Clear to auscultation, no wheezing or rhonchi noted. No increased vocal fremitus resonant to percussion  Abdomen: Tenderness Neuro: No focal neurological deficits noted cranial nerves II through XII grossly intact. DTRs 2+ bilaterally upper and lower extremities. Strength 5 out of 5 in bilateral upper and lower extremities. Musculoskeletal: Generalize body tenderness Psychiatric: Patient alert and oriented x3, good insight and cognition, good recent to remote recall. Lymph node survey: No cervical axillary or  inguinal lymphadenopathy noted.  Lab Results:  Basic Metabolic Panel:    Component Value Date/Time   NA 143 03/11/2024 2022   K 3.5 03/11/2024 2022   CL 107 03/11/2024 2022   CO2 23 03/11/2024 2022   BUN <5 (L) 03/11/2024 2022   CREATININE 0.96 03/11/2024 2022   GLUCOSE 102 (H) 03/11/2024 2022   CALCIUM 9.4 03/11/2024 2022   CBC:    Component Value Date/Time   WBC 14.1 (H) 03/11/2024 2022   HGB 8.9 (L) 03/11/2024 2022   HCT 24.5 (L) 03/11/2024 2022   PLT 196 03/11/2024 2022   MCV 103.8 (H) 03/11/2024 2022   NEUTROABS 9.3 (H) 03/10/2024 0036   LYMPHSABS 3.3 03/10/2024 0036   MONOABS 1.4 (H) 03/10/2024 0036   EOSABS 0.1 03/10/2024 0036   BASOSABS 0.1 03/10/2024 0036    No results found for this or any previous visit (from the past 240 hours).  Studies/Results: DG ABD ACUTE 2+V W 1V CHEST Result Date: 03/12/2024 CLINICAL DATA:  [id number]. Nausea and vomiting. Sickle cell pain crisis. Back pain and abdominal pain. EXAM: DG ABDOMEN ACUTE WITH 1 VIEW CHEST COMPARISON:  CT abdomen and pelvis with contrast 01/14/2024 and portable chest 01/14/2024. FINDINGS: There is no evidence of dilated bowel loops or free intraperitoneal air. There is mild fecal stasis. No radiopaque calculi or other significant radiographic abnormality is seen. There are cholecystectomy clips. Four threaded left hip pins. Heart size and mediastinal contours are within normal limits. Both lungs are clear.  Thoracic spine fusion hardware is again noted with upper thoracic levoscoliosis. IMPRESSION: Negative abdominal radiographs.  No acute cardiopulmonary disease. Electronically Signed   By: Francis Quam M.D.   On: 03/12/2024 01:02    Medications: Scheduled Meds:  enoxaparin  (LOVENOX ) injection  40 mg Subcutaneous Q24H   folic acid   1 mg Oral Daily   HYDROmorphone    Intravenous Q4H   hydroxyurea   1,500 mg Oral Daily   Continuous Infusions:  sodium chloride  75 mL/hr at 03/13/24 0804   PRN Meds:.diphenhydrAMINE ,  naloxone  **AND** sodium chloride  flush, ondansetron  (ZOFRAN ) IV, oxyCODONE   Consultants: None  Procedures: None  Antibiotics: None  Assessment/Plan: Principal Problem:   Sickle cell pain crisis (HCC) Active Problems:   Nausea vomiting and diarrhea   Anemia of chronic disease   Leukocytosis   Chronic pain syndrome   Hb Sickle Cell Disease with Pain crisis: Continue IVF 0.45% Saline @ KVO continue weight based Dilaudid  PCA at current dose,  IV Toradol  15 mg Q 6 H for a total of 5 days, continue oral home pain medications as ordered. Monitor vitals very closely, Re-evaluate pain scale regularly, 2 L of Oxygen by [place]. Patient encouraged to ambulate on the hallway today.  Nausea Vomiting: Nausea is gradually resolving, patient has not had any episode of vomiting in the past 24 hours. CT: Negative abdominal radiographs. Continue Zofran  as prescribed..  Leukocytosis:  Elevated. No current signs or symptoms of infection , will monitor without antibiotics.  Anemia of Chronic Disease: Hgb at patients baseline  Chronic pain Syndrome: continue oral home pain medication      Code Status: Full Code Family Communication: N/A Disposition Plan: Not yet ready for discharge  [name] Cover NP   If 7PM-7AM, please contact night-coverage.  03/13/2024, 8:58 AM  LOS: 1 day

## 2024-03-13 NOTE — Plan of Care (Signed)

## 2024-03-14 LAB — CBC
HCT: 22.9 % — ABNORMAL LOW (ref 39.0–52.0)
Hemoglobin: 8.1 g/dL — ABNORMAL LOW (ref 13.0–17.0)
MCH: 38 pg — ABNORMAL HIGH (ref 26.0–34.0)
MCHC: 35.4 g/dL (ref 30.0–36.0)
MCV: 107.5 fL — ABNORMAL HIGH (ref 80.0–100.0)
Platelets: 217 K/uL (ref 150–400)
RBC: 2.13 MIL/uL — ABNORMAL LOW (ref 4.22–5.81)
RDW: 18.9 % — ABNORMAL HIGH (ref 11.5–15.5)
WBC: 13.9 K/uL — ABNORMAL HIGH (ref 4.0–10.5)
nRBC: 2.2 % — ABNORMAL HIGH (ref 0.0–0.2)

## 2024-03-14 NOTE — Progress Notes (Signed)
 Verifying pca settings on pump per order  0.5mg  dose, lockout and max limit is 3mg /1hr

## 2024-03-14 NOTE — Progress Notes (Cosign Needed Addendum)
 Patient ID: Manuel Mcguire, male   DOB: 04/26/1991, 33 y.o.   MRN: [id number] Subjective: Manuel Mcguire  is a 33 y.o. male with a medical history significant for sickle cell disease, essential hypertension, status post lap chole with intraoperative cholangiogram on 11/22/2023, and anemia of chronic disease presented to the emergency department with complaints of vomiting,  back and leg pain.   This morning patient is sitting up in bed, he reports that nausea resolved, he endorses pain of 6/10. Denies subjective fever, Diarrhea. No urinary symptoms.   Objective:  Vital signs in last 24 hours:  Vitals:   03/14/24 0500 03/14/24 0809 03/14/24 0814 03/14/24 0818  BP:    (!) 168/94  Pulse:    85  Resp: 16 20 13    Temp:    98.2 F (36.8 C)  TempSrc:      SpO2: 99%   100%  Weight:      Height:        Intake/Output from previous day:   Intake/Output Summary (Last 24 hours) at 03/14/2024 1018 Last data filed at 03/13/2024 1619 Gross per 24 hour  Intake --  Output 1250 ml  Net -1250 ml    Physical Exam: General: Alert, awake, oriented x3, in no acute distress.  HEENT: [place]/AT [place], EOMI Neck: Trachea midline,  no masses, no thyromegal,y no JVD, no carotid bruit OROPHARYNX:  Moist, No exudate/ erythema/lesions.  Heart: Regular rate and rhythm, without murmurs, rubs, gallops, PMI non-displaced, no heaves or thrills on palpation.  Lungs: Clear to auscultation, no wheezing or rhonchi noted. No increased vocal fremitus resonant to percussion  Abdomen: Tenderness Neuro: No focal neurological deficits noted cranial nerves II through XII grossly intact. DTRs 2+ bilaterally upper and lower extremities. Strength 5 out of 5 in bilateral upper and lower extremities. Musculoskeletal: Generalize body tenderness Psychiatric: Patient alert and oriented x3, good insight and cognition, good recent to remote recall. Lymph node survey: No cervical axillary or inguinal lymphadenopathy noted.  Lab  Results:  Basic Metabolic Panel:    Component Value Date/Time   NA 143 03/11/2024 2022   K 3.5 03/11/2024 2022   CL 107 03/11/2024 2022   CO2 23 03/11/2024 2022   BUN <5 (L) 03/11/2024 2022   CREATININE 0.96 03/11/2024 2022   GLUCOSE 102 (H) 03/11/2024 2022   CALCIUM 9.4 03/11/2024 2022   CBC:    Component Value Date/Time   WBC 13.9 (H) 03/14/2024 0445   HGB 8.1 (L) 03/14/2024 0445   HCT 22.9 (L) 03/14/2024 0445   PLT 217 03/14/2024 0445   MCV 107.5 (H) 03/14/2024 0445   NEUTROABS 9.3 (H) 03/10/2024 0036   LYMPHSABS 3.3 03/10/2024 0036   MONOABS 1.4 (H) 03/10/2024 0036   EOSABS 0.1 03/10/2024 0036   BASOSABS 0.1 03/10/2024 0036    No results found for this or any previous visit (from the past 240 hours).  Studies/Results: No results found.   Medications: Scheduled Meds:  enoxaparin  (LOVENOX ) injection  40 mg Subcutaneous Q24H   folic acid   1 mg Oral Daily   HYDROmorphone    Intravenous Q4H   hydroxyurea   1,500 mg Oral Daily   Continuous Infusions:   PRN Meds:.diphenhydrAMINE , naloxone  **AND** sodium chloride  flush, ondansetron  (ZOFRAN ) IV, oxyCODONE   Consultants: None  Procedures: None  Antibiotics: None  Assessment/Plan: Principal Problem:   Sickle cell pain crisis (HCC) Active Problems:   Nausea vomiting and diarrhea   Anemia of chronic disease   Leukocytosis   Chronic pain syndrome   Hb  Sickle Cell Disease with Pain crisis: Continue IVF 0.45% Saline @ KVO continue weight based Dilaudid  PCA at current dose,  IV Toradol  15 mg Q 6 H for a total of 5 days, continue oral home pain medications as ordered. Monitor vitals very closely, Re-evaluate pain scale regularly, 2 L of Oxygen by [place]. Patient encouraged to ambulate on the hallway today.  Nausea: resolved, patient has not had any episode of vomiting in the past 24 hours. CT: Negative abdominal radiographs. Continue Zofran  as prescribed..  Leukocytosis:  Elevated. No current signs or symptoms of  infection , will monitor without antibiotics.  Anemia of Chronic Disease: Hgb at patients baseline  Chronic pain Syndrome: continue oral home pain medication      Code Status: Full Code Family Communication: N/A Disposition Plan: Not yet ready for discharge  [name] Cover NP   If 7PM-7AM, please contact night-coverage.  03/14/2024, 10:18 AM  LOS: 2 days

## 2024-03-15 LAB — CBC
HCT: 22.1 % — ABNORMAL LOW (ref 39.0–52.0)
Hemoglobin: 7.8 g/dL — ABNORMAL LOW (ref 13.0–17.0)
MCH: 37.7 pg — ABNORMAL HIGH (ref 26.0–34.0)
MCHC: 35.3 g/dL (ref 30.0–36.0)
MCV: 106.8 fL — ABNORMAL HIGH (ref 80.0–100.0)
Platelets: 271 K/uL (ref 150–400)
RBC: 2.07 MIL/uL — ABNORMAL LOW (ref 4.22–5.81)
RDW: 18.6 % — ABNORMAL HIGH (ref 11.5–15.5)
WBC: 14.7 K/uL — ABNORMAL HIGH (ref 4.0–10.5)
nRBC: 0.9 % — ABNORMAL HIGH (ref 0.0–0.2)

## 2024-03-15 MED ORDER — HYDROXYZINE HCL 25 MG PO TABS
25.0000 mg | ORAL_TABLET | Freq: Once | ORAL | Status: AC
  Administered 2024-03-15: 25 mg via ORAL
  Filled 2024-03-15: qty 1

## 2024-03-15 NOTE — Plan of Care (Signed)

## 2024-03-15 NOTE — Progress Notes (Signed)
 Patient ID: Manuel Mcguire, male   DOB: 08/27/1990, 33 y.o.   MRN: [id number] Subjective: Manuel Mcguire  is a 33 y.o. male with a medical history significant for sickle cell disease, essential hypertension, status post lap chole with intraoperative cholangiogram on 11/22/2023, and anemia of chronic disease presented to the emergency department with complaints of vomiting,  back and leg pain.   This morning patient is sitting up in bed, he reports that nausea resolved, he endorses pain of 6-5/10. Denies subjective fever, Diarrhea. No urinary symptoms.   Objective:  Vital signs in last 24 hours:  Vitals:   03/15/24 0418 03/15/24 0538 03/15/24 0738 03/15/24 0816  BP: (!) 138/97  (!) 144/94   Pulse: 85  100   Resp:  18 20 (!) 22  Temp: 98.1 F (36.7 C)  98.3 F (36.8 C)   TempSrc:      SpO2: 98% 93% 95% 93%  Weight:      Height:        Intake/Output from previous day:   Intake/Output Summary (Last 24 hours) at 03/15/2024 1129 Last data filed at 03/14/2024 1700 Gross per 24 hour  Intake 720 ml  Output 400 ml  Net 320 ml    Physical Exam: General: Alert, awake, oriented x3, in no acute distress.  HEENT: [place]/AT [place], EOMI Neck: Trachea midline,  no masses, no thyromegal,y no JVD, no carotid bruit OROPHARYNX:  Moist, No exudate/ erythema/lesions.  Heart: Regular rate and rhythm, without murmurs, rubs, gallops, PMI non-displaced, no heaves or thrills on palpation.  Lungs: Clear to auscultation, no wheezing or rhonchi noted. No increased vocal fremitus resonant to percussion  Abdomen: mild tenderness Neuro: No focal neurological deficits noted cranial nerves II through XII grossly intact. DTRs 2+ bilaterally upper and lower extremities. Strength 5 out of 5 in bilateral upper and lower extremities. Musculoskeletal: Generalize body tenderness Psychiatric: Patient alert and oriented x3, good insight and cognition, good recent to remote recall. Lymph node survey: No cervical axillary  or inguinal lymphadenopathy noted.  Lab Results:  Basic Metabolic Panel:    Component Value Date/Time   NA 143 03/11/2024 2022   K 3.5 03/11/2024 2022   CL 107 03/11/2024 2022   CO2 23 03/11/2024 2022   BUN <5 (L) 03/11/2024 2022   CREATININE 0.96 03/11/2024 2022   GLUCOSE 102 (H) 03/11/2024 2022   CALCIUM 9.4 03/11/2024 2022   CBC:    Component Value Date/Time   WBC 14.7 (H) 03/15/2024 0406   HGB 7.8 (L) 03/15/2024 0406   HCT 22.1 (L) 03/15/2024 0406   PLT 271 03/15/2024 0406   MCV 106.8 (H) 03/15/2024 0406   NEUTROABS 9.3 (H) 03/10/2024 0036   LYMPHSABS 3.3 03/10/2024 0036   MONOABS 1.4 (H) 03/10/2024 0036   EOSABS 0.1 03/10/2024 0036   BASOSABS 0.1 03/10/2024 0036    No results found for this or any previous visit (from the past 240 hours).  Studies/Results: No results found.   Medications: Scheduled Meds:  enoxaparin  (LOVENOX ) injection  40 mg Subcutaneous Q24H   folic acid   1 mg Oral Daily   HYDROmorphone    Intravenous Q4H   hydroxyurea   1,500 mg Oral Daily   Continuous Infusions:   PRN Meds:.diphenhydrAMINE , naloxone  **AND** sodium chloride  flush, ondansetron  (ZOFRAN ) IV, oxyCODONE   Consultants: None  Procedures: None  Antibiotics: None  Assessment/Plan: Principal Problem:   Sickle cell pain crisis (HCC) Active Problems:   Nausea vomiting and diarrhea   Anemia of chronic disease   Leukocytosis  Chronic pain syndrome   Hb Sickle Cell Disease with Pain crisis: Continue IVF 0.45% Saline @ KVO continue weight based Dilaudid  PCA at current dose,  IV Toradol  15 mg Q 6 H for a total of 5 days, continue oral home pain medications as ordered. Monitor vitals very closely, Re-evaluate pain scale regularly, 2 L of Oxygen by [place]. Patient encouraged to ambulate on the hallway today.  Nausea: resolved, patient has not had any episode of vomiting in the past 24 hours. CT: Negative abdominal radiographs. Continue Zofran  as prescribed..  Leukocytosis:   Elevated. No current signs or symptoms of infection , will monitor without antibiotics.  Anemia of Chronic Disease: Hgb at patients baseline  Chronic pain Syndrome: continue oral home pain medication      Code Status: Full Code Family Communication: N/A Disposition Plan: Ready for discharge in the AM  [name] Cover NP   If 7PM-7AM, please contact night-coverage.  03/15/2024, 11:29 AM  LOS: 3 days

## 2024-03-15 NOTE — Plan of Care (Signed)
   Problem: Clinical Measurements: Goal: Ability to maintain clinical measurements within normal limits will improve Outcome: Progressing Goal: Will remain free from infection Outcome: Progressing

## 2024-03-16 LAB — CBC
HCT: 21.2 % — ABNORMAL LOW (ref 39.0–52.0)
Hemoglobin: 7.6 g/dL — ABNORMAL LOW (ref 13.0–17.0)
MCH: 37.4 pg — ABNORMAL HIGH (ref 26.0–34.0)
MCHC: 35.8 g/dL (ref 30.0–36.0)
MCV: 104.4 fL — ABNORMAL HIGH (ref 80.0–100.0)
Platelets: 321 K/uL (ref 150–400)
RBC: 2.03 MIL/uL — ABNORMAL LOW (ref 4.22–5.81)
RDW: 19 % — ABNORMAL HIGH (ref 11.5–15.5)
WBC: 13.5 K/uL — ABNORMAL HIGH (ref 4.0–10.5)
nRBC: 0.7 % — ABNORMAL HIGH (ref 0.0–0.2)

## 2024-03-16 MED ORDER — HYDROXYZINE HCL 25 MG PO TABS
25.0000 mg | ORAL_TABLET | Freq: Two times a day (BID) | ORAL | Status: AC | PRN
Start: 2024-03-16 — End: 2024-03-18
  Administered 2024-03-16 – 2024-03-17 (×3): 25 mg via ORAL
  Filled 2024-03-16 (×3): qty 1

## 2024-03-16 NOTE — Plan of Care (Signed)
  Problem: Education: Goal: Knowledge of General Education information will improve Description: Including pain rating scale, medication(s)/side effects and non-pharmacologic comfort measures Outcome: Progressing   Problem: Health Behavior/Discharge Planning: Goal: Ability to manage health-related needs will improve Outcome: Progressing   Problem: Clinical Measurements: Goal: Ability to maintain clinical measurements within normal limits will improve Outcome: Progressing Goal: Will remain free from infection Outcome: Progressing Goal: Diagnostic test results will improve Outcome: Progressing Goal: Respiratory complications will improve Outcome: Progressing Goal: Cardiovascular complication will be avoided Outcome: Progressing   Problem: Activity: Goal: Risk for activity intolerance will decrease Outcome: Progressing   Problem: Activity: Goal: Risk for activity intolerance will decrease Outcome: Progressing   Problem: Nutrition: Goal: Adequate nutrition will be maintained Outcome: Progressing   Problem: Coping: Goal: Level of anxiety will decrease Outcome: Progressing   Problem: Elimination: Goal: Will not experience complications related to bowel motility Outcome: Progressing Goal: Will not experience complications related to urinary retention Outcome: Progressing   Problem: Pain Managment: Goal: General experience of comfort will improve and/or be controlled Outcome: Progressing   Problem: Safety: Goal: Ability to remain free from injury will improve Outcome: Progressing   Problem: Skin Integrity: Goal: Risk for impaired skin integrity will decrease Outcome: Progressing   Problem: Education: Goal: Knowledge of vaso-occlusive preventative measures will improve Outcome: Progressing Goal: Awareness of infection prevention will improve Outcome: Progressing Goal: Awareness of signs and symptoms of anemia will improve Outcome: Progressing Goal: Long-term  complications will improve Outcome: Progressing

## 2024-03-16 NOTE — Progress Notes (Signed)
 Patient ID: Manuel Mcguire, male   DOB: Aug 12, 1990, 33 y.o.   MRN: [id number] Subjective: Manuel Mcguire  is a 33 y.o. male with a medical history significant for sickle cell disease, essential hypertension, status post lap chole with intraoperative cholangiogram on 11/22/2023, and anemia of chronic disease presented to the emergency department with complaints of Nausea and vomiting, back and leg pain.   This morning patient is sitting up in his chair, bent over and moaning in pain, he reports that nausea has resolved however he reports my back is killing me, he endorses pain of 7/10. Denies subjective fever, Diarrhea. No urinary symptoms.  Encouraged patient to use pain medication as prescribed and walk in the hall way to avoid laying in bed all day.   Objective:  Vital signs in last 24 hours:  Vitals:   03/16/24 0357 03/16/24 0501 03/16/24 0814 03/16/24 0904  BP:  (!) 136/95 134/85   Pulse:  85 (!) 110   Resp: 15 20 20 20   Temp:  98.2 F (36.8 C) 98.9 F (37.2 C)   TempSrc:   Oral   SpO2: 95% 100% 96% 94%  Weight:      Height:        Intake/Output from previous day:   Intake/Output Summary (Last 24 hours) at 03/16/2024 1137 Last data filed at 03/16/2024 0745 Gross per 24 hour  Intake --  Output 1100 ml  Net -1100 ml    Physical Exam: General: Alert, awake, oriented x3, in no acute distress.  HEENT: [place]/AT [place], EOMI Neck: Trachea midline,  no masses, no thyromegal,y no JVD, no carotid bruit OROPHARYNX:  Moist, No exudate/ erythema/lesions.  Heart: Regular rate and rhythm, without murmurs, rubs, gallops, PMI non-displaced, no heaves or thrills on palpation.  Lungs: Clear to auscultation, no wheezing or rhonchi noted. No increased vocal fremitus resonant to percussion  Abdomen: None tender, no Nausea or vomiting.  Neuro: No focal neurological deficits noted cranial nerves II through XII grossly intact. DTRs 2+ bilaterally upper and lower extremities. Strength 5 out of 5  in bilateral upper and lower extremities. Musculoskeletal: Lower back tenderness. Psychiatric: Patient alert and oriented x3, good insight and cognition, good recent to remote recall. Lymph node survey: No cervical axillary or inguinal lymphadenopathy noted.  Lab Results:  Basic Metabolic Panel:    Component Value Date/Time   NA 143 03/11/2024 2022   K 3.5 03/11/2024 2022   CL 107 03/11/2024 2022   CO2 23 03/11/2024 2022   BUN <5 (L) 03/11/2024 2022   CREATININE 0.96 03/11/2024 2022   GLUCOSE 102 (H) 03/11/2024 2022   CALCIUM 9.4 03/11/2024 2022   CBC:    Component Value Date/Time   WBC 13.5 (H) 03/16/2024 0417   HGB 7.6 (L) 03/16/2024 0417   HCT 21.2 (L) 03/16/2024 0417   PLT 321 03/16/2024 0417   MCV 104.4 (H) 03/16/2024 0417   NEUTROABS 9.3 (H) 03/10/2024 0036   LYMPHSABS 3.3 03/10/2024 0036   MONOABS 1.4 (H) 03/10/2024 0036   EOSABS 0.1 03/10/2024 0036   BASOSABS 0.1 03/10/2024 0036    No results found for this or any previous visit (from the past 240 hours).  Studies/Results: No results found.   Medications: Scheduled Meds:  enoxaparin  (LOVENOX ) injection  40 mg Subcutaneous Q24H   folic acid   1 mg Oral Daily   HYDROmorphone    Intravenous Q4H   hydroxyurea   1,500 mg Oral Daily   Continuous Infusions:   PRN Meds:.diphenhydrAMINE , hydrOXYzine , naloxone  **AND** sodium chloride   flush, ondansetron  (ZOFRAN ) IV, oxyCODONE   Consultants: None  Procedures: None  Antibiotics: None  Assessment/Plan: Principal Problem:   Sickle cell pain crisis (HCC) Active Problems:   Nausea vomiting and diarrhea   Anemia of chronic disease   Leukocytosis   Chronic pain syndrome   Hb Sickle Cell Disease with Pain crisis: Continue IVF 0.45% Saline @ KVO continue Dilaudid  PCA at current dose,  IV Toradol  15 mg Q 6 H for a total of 5 days, continue oral home pain medications as ordered. Monitor vitals very closely, Re-evaluate pain scale regularly, 2 L of Oxygen by [place].  Patient encouraged to ambulate on the hallway today.  Nausea/Vomiting: resolved, patient has not had any episode of vomiting in the past 24 hours. CT: Negative abdominal radiographs. Continue Zofran  as prescribed..  Leukocytosis:  Elevated but trending down. No current signs or symptoms of infection, will monitor without antibiotics.  Anemia of Chronic Disease: Hgb at patients baseline  Chronic pain Syndrome: continue oral home pain medication      Code Status: Full Code Family Communication: N/A Disposition Plan: Ready for discharge in the AM  [name] Cover NP   If 7PM-7AM, please contact night-coverage.  03/16/2024, 11:37 AM  LOS: 4 days

## 2024-03-16 NOTE — Plan of Care (Signed)

## 2024-03-17 DIAGNOSIS — D5700 Hb-SS disease with crisis, unspecified: Secondary | ICD-10-CM | POA: Diagnosis not present

## 2024-03-17 MED ORDER — OXYCODONE HCL 5 MG PO TABS
5.0000 mg | ORAL_TABLET | ORAL | Status: DC | PRN
  Administered 2024-03-17 – 2024-03-18 (×4): 5 mg via ORAL
  Filled 2024-03-17 (×4): qty 1

## 2024-03-17 NOTE — Plan of Care (Signed)

## 2024-03-17 NOTE — Progress Notes (Signed)
 Subjective: Manuel Mcguire is a 33 year old male with a medical history significant for sickle cell disease, essential hypertension, s/p lap chole with intraoperative cholangiogram on 11/22/2023, chronic pain syndrome, and anemia of chronic disease was admitted for sickle cell pain crisis. Pain intensity is 7/10 primarily to low back and lower extremity.  Patient characterizes pain as constant and throbbing.  He states that he has been unable to find a comfortable position.  Patient has been able to ambulate in room.  He currently denies any shortness of breath, chest pain, headache, dizziness, urinary symptoms, vomiting, or diarrhea.  Patient endorses nausea. Objective:  Vital signs in last 24 hours:  Vitals:   03/17/24 0026 03/17/24 0420 03/17/24 0802 03/17/24 0904  BP:   120/68   Pulse:   73   Resp: 17 14 20 16   Temp:   98.4 F (36.9 C)   TempSrc:      SpO2: 93% 96% 99% 95%  Weight:      Height:        Intake/Output from previous day:   Intake/Output Summary (Last 24 hours) at 03/17/2024 0951 Last data filed at 03/17/2024 0026 Gross per 24 hour  Intake 960 ml  Output 1525 ml  Net -565 ml    Physical Exam: General: Alert, awake, oriented x3, in no acute distress.  HEENT: [place]/AT [place], EOMI Neck: Trachea midline,  no masses, no thyromegal,y no JVD, no carotid bruit OROPHARYNX:  Moist, No exudate/ erythema/lesions.  Heart: Regular rate and rhythm, without murmurs, rubs, gallops, PMI non-displaced, no heaves or thrills on palpation.  Lungs: Clear to auscultation, no wheezing or rhonchi noted. No increased vocal fremitus resonant to percussion  Abdomen: Soft, nontender, nondistended, positive bowel sounds, no masses no hepatosplenomegaly noted..  Neuro: No focal neurological deficits noted cranial nerves II through XII grossly intact. DTRs 2+ bilaterally upper and lower extremities. Strength 5 out of 5 in bilateral upper and lower extremities. Musculoskeletal: No warm swelling or  erythema around joints, no spinal tenderness noted. Psychiatric: Patient alert and oriented x3, good insight and cognition, good recent to remote recall. Lymph node survey: No cervical axillary or inguinal lymphadenopathy noted.  Lab Results:  Basic Metabolic Panel:    Component Value Date/Time   NA 143 03/11/2024 2022   K 3.5 03/11/2024 2022   CL 107 03/11/2024 2022   CO2 23 03/11/2024 2022   BUN <5 (L) 03/11/2024 2022   CREATININE 0.96 03/11/2024 2022   GLUCOSE 102 (H) 03/11/2024 2022   CALCIUM 9.4 03/11/2024 2022   CBC:    Component Value Date/Time   WBC 13.5 (H) 03/16/2024 0417   HGB 7.6 (L) 03/16/2024 0417   HCT 21.2 (L) 03/16/2024 0417   PLT 321 03/16/2024 0417   MCV 104.4 (H) 03/16/2024 0417   NEUTROABS 9.3 (H) 03/10/2024 0036   LYMPHSABS 3.3 03/10/2024 0036   MONOABS 1.4 (H) 03/10/2024 0036   EOSABS 0.1 03/10/2024 0036   BASOSABS 0.1 03/10/2024 0036    No results found for this or any previous visit (from the past 240 hours).  Studies/Results: No results found.  Medications: Scheduled Meds:  enoxaparin  (LOVENOX ) injection  40 mg Subcutaneous Q24H   folic acid   1 mg Oral Daily   HYDROmorphone    Intravenous Q4H   hydroxyurea   1,500 mg Oral Daily   Continuous Infusions: PRN Meds:.diphenhydrAMINE , hydrOXYzine , naloxone  **AND** sodium chloride  flush, ondansetron  (ZOFRAN ) IV, oxyCODONE   Consultants: none  Procedures: none  Antibiotics: none  Assessment/Plan: Principal Problem:   Sickle  cell pain crisis (HCC) Active Problems:   Nausea vomiting and diarrhea   Anemia of chronic disease   Leukocytosis   Chronic pain syndrome  Sickle cell disease with pain crisis: Pain intensity uncontrolled on current medication regimen.  Continue IV Dilaudid  PCA without changes in settings. Encouraged oxycodone  5 mg, will increase frequency to every 4 hours as opposed to every 6. Follow vital signs closely, reevaluate pain scale regularly, and supplemental oxygen as  needed.  Chronic pain syndrome: Continue home medication regimen  Leukocytosis: WBCs mildly elevated.  Appears to have chronic leukocytosis.  No signs of acute infection.  No antibiotics warranted at this time.  Will continue to monitor labs closely.  CBC in AM.  Anemia of chronic disease: Patient's hemoglobin is stable.  Slightly below his baseline.  Patient is typically not transfused unless hemoglobin is less than 7.0 g/dL.  Will monitor closely.  Labs in AM.  Nausea/vomiting: Patient endorses nausea.  Continue antiemetics.  Advance diet as tolerated.   Code Status: Full Code Family Communication: N/A Disposition Plan: Not yet ready for discharge  Bertram Georgina Bald  DNP, APRN, FNP-C [facility] [address] [place], [place] [address] [phone]  If 7PM-7AM, please contact night-coverage.  03/17/2024, 9:51 AM  LOS: 5 days

## 2024-03-18 DIAGNOSIS — D5700 Hb-SS disease with crisis, unspecified: Secondary | ICD-10-CM | POA: Diagnosis not present

## 2024-03-18 LAB — CBC WITH DIFFERENTIAL/PLATELET
Abs Immature Granulocytes: 0.06 K/uL (ref 0.00–0.07)
Basophils Absolute: 0.1 K/uL (ref 0.0–0.1)
Basophils Relative: 1 %
Eosinophils Absolute: 0.2 K/uL (ref 0.0–0.5)
Eosinophils Relative: 2 %
HCT: 22.9 % — ABNORMAL LOW (ref 39.0–52.0)
Hemoglobin: 8.2 g/dL — ABNORMAL LOW (ref 13.0–17.0)
Immature Granulocytes: 1 %
Lymphocytes Relative: 38 %
Lymphs Abs: 4.1 K/uL — ABNORMAL HIGH (ref 0.7–4.0)
MCH: 36.9 pg — ABNORMAL HIGH (ref 26.0–34.0)
MCHC: 35.8 g/dL (ref 30.0–36.0)
MCV: 103.2 fL — ABNORMAL HIGH (ref 80.0–100.0)
Monocytes Absolute: 1.7 K/uL — ABNORMAL HIGH (ref 0.1–1.0)
Monocytes Relative: 16 %
Neutro Abs: 4.7 K/uL (ref 1.7–7.7)
Neutrophils Relative %: 42 %
Platelets: 436 K/uL — ABNORMAL HIGH (ref 150–400)
RBC: 2.22 MIL/uL — ABNORMAL LOW (ref 4.22–5.81)
RDW: 18.5 % — ABNORMAL HIGH (ref 11.5–15.5)
WBC: 10.8 K/uL — ABNORMAL HIGH (ref 4.0–10.5)
nRBC: 1.1 % — ABNORMAL HIGH (ref 0.0–0.2)

## 2024-03-18 LAB — COMPREHENSIVE METABOLIC PANEL WITH GFR
ALT: 32 U/L (ref 0–44)
AST: 69 U/L — ABNORMAL HIGH (ref 15–41)
Albumin: 4.2 g/dL (ref 3.5–5.0)
Alkaline Phosphatase: 103 U/L (ref 38–126)
Anion gap: 10 (ref 5–15)
BUN: 12 mg/dL (ref 6–20)
CO2: 26 mmol/L (ref 22–32)
Calcium: 9.4 mg/dL (ref 8.9–10.3)
Chloride: 101 mmol/L (ref 98–111)
Creatinine, Ser: 0.89 mg/dL (ref 0.61–1.24)
GFR, Estimated: >60 mL/min (ref >60)
Glucose, Bld: 93 mg/dL (ref 70–99)
Potassium: 4.3 mmol/L (ref 3.5–5.1)
Sodium: 137 mmol/L (ref 135–145)
Total Bilirubin: 2.3 mg/dL — ABNORMAL HIGH (ref 0.0–1.2)
Total Protein: 7.9 g/dL (ref 6.5–8.1)

## 2024-03-18 MED ORDER — OXYCODONE HCL 5 MG PO TABS
10.0000 mg | ORAL_TABLET | ORAL | Status: DC | PRN
  Administered 2024-03-18 – 2024-03-20 (×9): 10 mg via ORAL
  Filled 2024-03-18 (×9): qty 2

## 2024-03-18 MED ORDER — HYDROMORPHONE 1 MG/ML IV SOLN
INTRAVENOUS | Status: DC
  Administered 2024-03-18: 8 mg via INTRAVENOUS
  Administered 2024-03-18: 30 mg via INTRAVENOUS
  Administered 2024-03-18: 5 mg via INTRAVENOUS
  Administered 2024-03-19: 4 mg via INTRAVENOUS
  Administered 2024-03-19: 3 mg via INTRAVENOUS
  Administered 2024-03-19: 30 mg via INTRAVENOUS
  Administered 2024-03-19: 5 mg via INTRAVENOUS
  Administered 2024-03-19: 3.5 mg via INTRAVENOUS
  Filled 2024-03-18 (×2): qty 30

## 2024-03-18 NOTE — Plan of Care (Signed)

## 2024-03-18 NOTE — Plan of Care (Signed)
   Problem: Education: Goal: Knowledge of General Education information will improve Description: Including pain rating scale, medication(s)/side effects and non-pharmacologic comfort measures Outcome: Progressing   Problem: Clinical Measurements: Goal: Ability to maintain clinical measurements within normal limits will improve Outcome: Progressing

## 2024-03-18 NOTE — Progress Notes (Signed)
 Subjective: Manuel Mcguire is a 33 year old male with a medical history significant for sickle cell disease, essential hypertension, s/p lap chole with intraoperative cholangiogram on 11/22/2023, chronic pain syndrome, and anemia of chronic disease was admitted for sickle cell pain crisis. Pain intensity is 7/10 primarily to low back and lower extremity.  Patient characterizes pain as constant and throbbing.  Patient states that pain intensity has increased overnight.  He currently denies any shortness of breath, chest pain, headache, dizziness, urinary symptoms, vomiting, or diarrhea.  Patient endorses nausea. Objective:  Vital signs in last 24 hours:  Vitals:   03/18/24 0401 03/18/24 0410 03/18/24 0753 03/18/24 0810  BP:   121/69   Pulse:   84   Resp: 14 19 18 18   Temp:   98.3 F (36.8 C)   TempSrc:      SpO2:   98%   Weight:      Height:        Intake/Output from previous day:   Intake/Output Summary (Last 24 hours) at 03/18/2024 1107 Last data filed at 03/17/2024 1450 Gross per 24 hour  Intake 360 ml  Output 700 ml  Net -340 ml    Physical Exam: General: Alert, awake, oriented x3, in no acute distress.  HEENT: [place]/AT [place], EOMI Neck: Trachea midline,  no masses, no thyromegal,y no JVD, no carotid bruit OROPHARYNX:  Moist, No exudate/ erythema/lesions.  Heart: Regular rate and rhythm, without murmurs, rubs, gallops, PMI non-displaced, no heaves or thrills on palpation.  Lungs: Clear to auscultation, no wheezing or rhonchi noted. No increased vocal fremitus resonant to percussion  Abdomen: Soft, nontender, nondistended, positive bowel sounds, no masses no hepatosplenomegaly noted..  Neuro: No focal neurological deficits noted cranial nerves II through XII grossly intact. DTRs 2+ bilaterally upper and lower extremities. Strength 5 out of 5 in bilateral upper and lower extremities. Musculoskeletal: No warm swelling or erythema around joints, no spinal tenderness  noted. Psychiatric: Patient alert and oriented x3, good insight and cognition, good recent to remote recall. Lymph node survey: No cervical axillary or inguinal lymphadenopathy noted.  Lab Results:  Basic Metabolic Panel:    Component Value Date/Time   NA 143 03/11/2024 2022   K 3.5 03/11/2024 2022   CL 107 03/11/2024 2022   CO2 23 03/11/2024 2022   BUN <5 (L) 03/11/2024 2022   CREATININE 0.96 03/11/2024 2022   GLUCOSE 102 (H) 03/11/2024 2022   CALCIUM 9.4 03/11/2024 2022   CBC:    Component Value Date/Time   WBC 13.5 (H) 03/16/2024 0417   HGB 7.6 (L) 03/16/2024 0417   HCT 21.2 (L) 03/16/2024 0417   PLT 321 03/16/2024 0417   MCV 104.4 (H) 03/16/2024 0417   NEUTROABS 9.3 (H) 03/10/2024 0036   LYMPHSABS 3.3 03/10/2024 0036   MONOABS 1.4 (H) 03/10/2024 0036   EOSABS 0.1 03/10/2024 0036   BASOSABS 0.1 03/10/2024 0036    No results found for this or any previous visit (from the past 240 hours).  Studies/Results: No results found.  Medications: Scheduled Meds:  enoxaparin  (LOVENOX ) injection  40 mg Subcutaneous Q24H   folic acid   1 mg Oral Daily   HYDROmorphone    Intravenous Q4H   hydroxyurea   1,500 mg Oral Daily   Continuous Infusions: PRN Meds:.diphenhydrAMINE , naloxone  **AND** sodium chloride  flush, ondansetron  (ZOFRAN ) IV, oxyCODONE   Consultants: none  Procedures: none  Antibiotics: none  Assessment/Plan: Principal Problem:   Sickle cell pain crisis (HCC) Active Problems:   Nausea vomiting and diarrhea  Anemia of chronic disease   Leukocytosis   Chronic pain syndrome  Sickle cell disease with pain crisis: Continue IV Dilaudid  PCA decrease settings in preparation for discharge on 03/19/2024 Encouraged oxycodone  5 mg, will increase frequency to every 4 hours as opposed to every 6 hours as needed Follow vital signs closely, reevaluate pain scale regularly, and supplemental oxygen as needed.  Chronic pain syndrome: Continue home medication  regimen  Leukocytosis: WBCs mildly elevated.  Appears to have chronic leukocytosis.  No signs of acute infection.  No antibiotics warranted at this time.  Will continue to monitor labs closely.  CBC in AM.  Anemia of chronic disease: Patient's hemoglobin is stable.  Slightly below his baseline.  Patient is typically not transfused unless hemoglobin is less than 7.0 g/dL.  Will monitor closely.  Awaiting lab results  Nausea/vomiting: Patient endorses nausea.  Continue antiemetics.  Advance diet as tolerated.   Code Status: Full Code Family Communication: N/A Disposition Plan: Not yet ready for discharge. Discharge planned for 03/19/2024  Bertram Georgina Bald  DNP, APRN, FNP-C [facility] [address] [place], [place] [address] [phone]  If 7PM-7AM, please contact night-coverage.  03/18/2024, 11:07 AM  LOS: 6 days

## 2024-03-19 NOTE — Plan of Care (Signed)

## 2024-03-19 NOTE — Plan of Care (Signed)
   Problem: Education: Goal: Knowledge of General Education information will improve Description: Including pain rating scale, medication(s)/side effects and non-pharmacologic comfort measures Outcome: Progressing   Problem: Clinical Measurements: Goal: Ability to maintain clinical measurements within normal limits will improve Outcome: Progressing

## 2024-03-19 NOTE — Progress Notes (Signed)
 Patient ID: Manuel Mcguire, male   DOB: [birth year], 33 y.o.   MRN: [id number] Subjective: Manuel Mcguire  is a 33 y.o. male with a medical history significant for sickle cell disease, essential hypertension, status post lap chole with intraoperative cholangiogram on 11/22/2023, and anemia of chronic disease presented to the emergency department with complaints of Nausea and vomiting, back and leg pain.   This morning patient is sound asleep, he reports that nausea has resolved and the pain in his back is gradually improving.  He endorses pain of 6/10. Denies subjective fever, headache, cough, and diarrhea. No urinary symptoms.   Objective:  Vital signs in last 24 hours:  Vitals:   03/19/24 0813 03/19/24 0838 03/19/24 1226 03/19/24 1229  BP: 122/72  129/75   Pulse: 69  80   Resp: 19 18 18 14   Temp: 98.1 F (36.7 C)  98.6 F (37 C)   TempSrc:   Oral   SpO2: 100%   100%  Weight:      Height:        Intake/Output from previous day:   Intake/Output Summary (Last 24 hours) at 03/19/2024 1507 Last data filed at 03/19/2024 1441 Gross per 24 hour  Intake 360 ml  Output 2250 ml  Net -1890 ml    Physical Exam: General: Alert, awake, oriented x3, in no acute distress.  HEENT: [place]/AT [place], EOMI Neck: Trachea midline,  no masses, no thyromegal,y no JVD, no carotid bruit OROPHARYNX:  Moist, No exudate/ erythema/lesions.  Heart: Regular rate and rhythm, without murmurs, rubs, gallops, PMI non-displaced, no heaves or thrills on palpation.  Lungs: Clear to auscultation, no wheezing or rhonchi noted. No increased vocal fremitus resonant to percussion  Abdomen: None tender, denies nausea or vomiting.  Neuro: No focal neurological deficits noted cranial nerves II through XII grossly intact. DTRs 2+ bilaterally upper and lower extremities. Strength 5 out of 5 in bilateral upper and lower extremities. Musculoskeletal: Lower back tenderness. Psychiatric: Patient alert and oriented x3, good  insight and cognition, good recent to remote recall. Lymph node survey: No cervical axillary or inguinal lymphadenopathy noted.  Lab Results:  Basic Metabolic Panel:    Component Value Date/Time   NA 137 03/18/2024 1520   K 4.3 03/18/2024 1520   CL 101 03/18/2024 1520   CO2 26 03/18/2024 1520   BUN 12 03/18/2024 1520   CREATININE 0.89 03/18/2024 1520   GLUCOSE 93 03/18/2024 1520   CALCIUM 9.4 03/18/2024 1520   CBC:    Component Value Date/Time   WBC 10.8 (H) 03/18/2024 1520   HGB 8.2 (L) 03/18/2024 1520   HCT 22.9 (L) 03/18/2024 1520   PLT 436 (H) 03/18/2024 1520   MCV 103.2 (H) 03/18/2024 1520   NEUTROABS 4.7 03/18/2024 1520   LYMPHSABS 4.1 (H) 03/18/2024 1520   MONOABS 1.7 (H) 03/18/2024 1520   EOSABS 0.2 03/18/2024 1520   BASOSABS 0.1 03/18/2024 1520    No results found for this or any previous visit (from the past 240 hours).  Studies/Results: No results found.   Medications: Scheduled Meds:  enoxaparin  (LOVENOX ) injection  40 mg Subcutaneous Q24H   folic acid   1 mg Oral Daily   HYDROmorphone    Intravenous Q4H   hydroxyurea   1,500 mg Oral Daily   Continuous Infusions:   PRN Meds:.diphenhydrAMINE , naloxone  **AND** sodium chloride  flush, ondansetron  (ZOFRAN ) IV, oxyCODONE   Consultants: None  Procedures: None  Antibiotics: None  Assessment/Plan: Principal Problem:   Sickle cell pain crisis (HCC) Active Problems:  Nausea vomiting and diarrhea   Anemia of chronic disease   Leukocytosis   Chronic pain syndrome   Hb Sickle Cell Disease with Pain crisis: Pain is gradually improving, continue IVF 0.45% Saline @ KVO continue Dilaudid  PCA at current dose,  IV Toradol  15 mg Q 6 H for a total of 5 days [completed].  Continue oral home pain medications as ordered. Monitor vitals very closely, Re-evaluate pain scale regularly, 2 L of Oxygen by [place]. Patient encouraged to ambulate on the hallway today.  Nausea/Vomiting: Resolved, patient has not had any  episode of vomiting in the past 24 hours. CT: Negative abdominal radiographs. Continue Zofran  as prescribed..  Leukocytosis:  Elevated but trending down. No current signs or symptoms of infection, will monitor without antibiotics.  Anemia of Chronic Disease: Hgb at patients baseline  Chronic pain Syndrome: continue oral home pain medication      Code Status: Full Code Family Communication: N/A Disposition Plan: Ready for discharge in the AM  [name] Cover NP   If 7PM-7AM, please contact night-coverage.  03/19/2024, 3:07 PM  LOS: 7 days

## 2024-03-20 MED ORDER — HYDROXYZINE HCL 25 MG PO TABS: 25.0000 mg | ORAL_TABLET | Freq: Once | ORAL | Status: DC

## 2024-03-20 NOTE — Plan of Care (Signed)

## 2024-03-20 NOTE — Plan of Care (Addendum)
 1025 D/C instructions given to patient. D/C IV and PCA. Requested zofran  prior to discontinuation of IV. Wasted 7ml of dilaudid  with Financial controller. Family will provide transportation.  Problem: Education: Goal: Knowledge of General Education information will improve Description: Including pain rating scale, medication(s)/side effects and non-pharmacologic comfort measures 03/20/2024 1007 by Freddi Honor Manuel CHRISTELLA, RN Outcome: Adequate for Discharge 03/20/2024 0717 by Freddi Honor Manuel CHRISTELLA, RN Outcome: Progressing   Problem: Health Behavior/Discharge Planning: Goal: Ability to manage health-related needs will improve 03/20/2024 1007 by Freddi Honor Manuel CHRISTELLA, RN Outcome: Adequate for Discharge 03/20/2024 0717 by Freddi Honor Manuel CHRISTELLA, RN Outcome: Progressing   Problem: Clinical Measurements: Goal: Ability to maintain clinical measurements within normal limits will improve 03/20/2024 1007 by Freddi Honor Manuel CHRISTELLA, RN Outcome: Adequate for Discharge 03/20/2024 0717 by Freddi Honor Manuel CHRISTELLA, RN Outcome: Progressing Goal: Will remain free from infection 03/20/2024 1007 by Freddi Honor Manuel CHRISTELLA, RN Outcome: Adequate for Discharge 03/20/2024 0717 by Freddi Honor Manuel CHRISTELLA, RN Outcome: Progressing Goal: Diagnostic test results will improve 03/20/2024 1007 by Freddi Honor Manuel CHRISTELLA, RN Outcome: Adequate for Discharge 03/20/2024 0717 by Freddi Honor Manuel CHRISTELLA, RN Outcome: Progressing Goal: Respiratory complications will improve 03/20/2024 1007 by Freddi Honor Manuel CHRISTELLA, RN Outcome: Adequate for Discharge 03/20/2024 0717 by Freddi Honor Manuel CHRISTELLA, RN Outcome: Progressing Goal: Cardiovascular complication will be avoided 03/20/2024 1007 by Freddi Honor Manuel CHRISTELLA, RN Outcome: Adequate for Discharge 03/20/2024 0717 by Freddi Honor Manuel CHRISTELLA, RN Outcome: Progressing   Problem: Activity: Goal: Risk for activity intolerance will decrease 03/20/2024 1007 by Freddi Honor Manuel CHRISTELLA, RN Outcome: Adequate for Discharge 03/20/2024 0717 by Freddi Honor Manuel CHRISTELLA, RN Outcome: Progressing   Problem: Nutrition: Goal: Adequate nutrition will be maintained 03/20/2024 1007 by Freddi Honor Manuel CHRISTELLA, RN Outcome: Adequate for Discharge 03/20/2024 0717 by Freddi Honor Manuel CHRISTELLA, RN Outcome: Progressing   Problem: Coping: Goal: Level of anxiety will decrease 03/20/2024 1007 by Freddi Honor Manuel CHRISTELLA, RN Outcome: Adequate for Discharge 03/20/2024 0717 by Freddi Honor Manuel CHRISTELLA, RN Outcome: Progressing   Problem: Elimination: Goal: Will not experience complications related to bowel motility 03/20/2024 1007 by Freddi Honor Manuel CHRISTELLA, RN Outcome: Adequate for Discharge 03/20/2024 0717 by Freddi Honor Manuel CHRISTELLA, RN Outcome: Progressing Goal: Will not experience complications related to urinary retention 03/20/2024 1007 by Freddi Honor Manuel CHRISTELLA, RN Outcome: Adequate for Discharge 03/20/2024 0717 by Freddi Honor Manuel CHRISTELLA, RN Outcome: Progressing   Problem: Pain Managment: Goal: General experience of comfort will improve and/or be controlled 03/20/2024 1007 by Freddi Honor Manuel CHRISTELLA, RN Outcome: Adequate for Discharge 03/20/2024 0717 by Freddi Honor Manuel CHRISTELLA, RN Outcome: Progressing   Problem: Safety: Goal: Ability to remain free from injury will improve 03/20/2024 1007 by Freddi Honor Manuel CHRISTELLA, RN Outcome: Adequate for Discharge 03/20/2024 0717 by Freddi Honor Manuel CHRISTELLA, RN Outcome: Progressing   Problem: Skin Integrity: Goal: Risk for impaired skin integrity will decrease 03/20/2024 1007 by Freddi Honor Manuel CHRISTELLA, RN Outcome: Adequate for Discharge 03/20/2024 0717 by Freddi Honor Manuel CHRISTELLA, RN Outcome: Progressing   Problem: Education: Goal: Knowledge of vaso-occlusive preventative measures will improve 03/20/2024 1007 by Freddi Honor Manuel CHRISTELLA, RN Outcome: Adequate for Discharge 03/20/2024 0717 by Freddi Honor Manuel CHRISTELLA,  RN Outcome: Progressing Goal: Awareness of infection prevention will improve 03/20/2024 1007 by Freddi Honor Manuel CHRISTELLA, RN Outcome: Adequate for Discharge 03/20/2024 9282 by Freddi Honor Manuel CHRISTELLA, RN Outcome: Progressing Goal: Awareness of signs and symptoms of anemia  will improve 03/20/2024 1007 by Freddi Honor Manuel CHRISTELLA, RN Outcome: Adequate for Discharge 03/20/2024 9282 by Freddi Honor Manuel CHRISTELLA, RN Outcome: Progressing Goal: Long-term complications will improve 03/20/2024 1007 by Freddi Honor Manuel CHRISTELLA, RN Outcome: Adequate for Discharge 03/20/2024 9282 by Freddi Honor Manuel CHRISTELLA, RN Outcome: Progressing   Problem: Self-Care: Goal: Ability to incorporate actions that prevent/reduce pain crisis will improve 03/20/2024 1007 by Freddi Honor Manuel CHRISTELLA, RN Outcome: Adequate for Discharge 03/20/2024 0717 by Freddi Honor Manuel CHRISTELLA, RN Outcome: Progressing   Problem: Bowel/Gastric: Goal: Gut motility will be maintained 03/20/2024 1007 by Freddi Honor Manuel CHRISTELLA, RN Outcome: Adequate for Discharge 03/20/2024 0717 by Freddi Honor Manuel CHRISTELLA, RN Outcome: Progressing   Problem: Tissue Perfusion: Goal: Complications related to inadequate tissue perfusion will be avoided or minimized 03/20/2024 1007 by Freddi Honor Manuel CHRISTELLA, RN Outcome: Adequate for Discharge 03/20/2024 0717 by Freddi Honor Manuel CHRISTELLA, RN Outcome: Progressing   Problem: Respiratory: Goal: Pulmonary complications will be avoided or minimized 03/20/2024 1007 by Freddi Honor Manuel CHRISTELLA, RN Outcome: Adequate for Discharge 03/20/2024 0717 by Freddi Honor Manuel CHRISTELLA, RN Outcome: Progressing Goal: Acute Chest Syndrome will be identified early to prevent complications 03/20/2024 1007 by Freddi Honor Manuel CHRISTELLA, RN Outcome: Adequate for Discharge 03/20/2024 0717 by Freddi Honor Manuel CHRISTELLA, RN Outcome: Progressing   Problem: Fluid Volume: Goal: Ability to maintain a balanced intake and output will  improve 03/20/2024 1007 by Freddi Honor Manuel CHRISTELLA, RN Outcome: Adequate for Discharge 03/20/2024 0717 by Freddi Honor Manuel CHRISTELLA, RN Outcome: Progressing   Problem: Sensory: Goal: Pain level will decrease with appropriate interventions 03/20/2024 1007 by Freddi Honor Manuel CHRISTELLA, RN Outcome: Adequate for Discharge 03/20/2024 0717 by Freddi Honor Manuel CHRISTELLA, RN Outcome: Progressing   Problem: Health Behavior: Goal: Postive changes in compliance with treatment and prescription regimens will improve 03/20/2024 1007 by Freddi Honor Manuel CHRISTELLA, RN Outcome: Adequate for Discharge 03/20/2024 0717 by Freddi Honor Manuel CHRISTELLA, RN Outcome: Progressing

## 2024-03-20 NOTE — Plan of Care (Signed)
   Problem: Education: Goal: Knowledge of General Education information will improve Description: Including pain rating scale, medication(s)/side effects and non-pharmacologic comfort measures Outcome: Progressing   Problem: Pain Managment: Goal: General experience of comfort will improve and/or be controlled Outcome: Progressing   Problem: Safety: Goal: Ability to remain free from injury will improve Outcome: Progressing

## 2024-03-20 NOTE — Discharge Summary (Addendum)
 Physician Discharge Summary  Manuel Mcguire FMW:[id number] DOB: 13-Aug-1990 DOA: 03/11/2024  PCP: Irean Karolynn Batters, NP  Admit date: 03/11/2024  Discharge date: 03/26/2024  Discharge Diagnoses:  Principal Problem:   Sickle cell pain crisis (HCC) Active Problems:   Nausea vomiting and diarrhea   Anemia of chronic disease   Leukocytosis   Chronic pain syndrome   Discharge Condition: Stable  Disposition:  Pt is discharged home in good condition and is to follow up with Dep, Karolynn Batters, NP this week to have labs evaluated. Manuel Mcguire is instructed to increase activity slowly and balance with rest for the next few days, and use prescribed medication to complete treatment of pain  Diet: Regular Wt Readings from Last 3 Encounters:  03/11/24 79.4 kg  03/09/24 79.4 kg  03/08/24 79.4 kg    History of present illness:  Manuel Mcguire  is a 33 y.o. male with a medical history significant for sickle cell disease, essential hypertension, status post lap chole with intraoperative cholangiogram on 11/22/2023, and anemia of chronic disease presented to the emergency department with complaints of vomiting, back and leg pain. He was unable to take his home medications due to the vomiting. No fevers or chills. He denies any diarrhea, fever, cough. He reports generalized abdominal discomfort. No urinary symptoms.     ED COURSE:  BP 117/75   Pulse 85   Temp 98.8 F (37.1 C) (Oral)   Resp 18   Ht 5' 7 (1.702 m)   Wt 79.4 kg   SpO2 93%   BMI 27.41 kg/m     WBC 14.1 (*)        RBC 2.36 (*)      Hemoglobin 8.9 (*)      HCT 24.5 (*)      MCV 103.8 (*)      MCH 37.7 (*)      MCHC 36.3 (*)      RDW 20.9 (*)      nRBC 6.1 (*)      In the emergency department patient was treated with IVF, IV pain medication , CT: Negative abdominal radiographs.  No acute cardiopulmonary disease. Patients pain was not resolved, he was admitted for ongoing sickle cell pain management.    Hospital Course:  Patient was admitted for sickle cell pain crisis and managed appropriately with IVF, IV Dilaudid  via PCA and IV Toradol , Patient gradually improved and as of today, his nausea and vomitting has resolved, pain is within his baseline, he is tolerating PO without nausea and vomitting, he is ambulating wihtout assistance.  Patient was therefore discharged in a hemodynamically stable condition.  Manuel Mcguire will follow-up with PCP. Manuel Mcguire was counseled extensively about nonpharmacologic means of pain management, patient verbalized understanding and was appreciative of  the care received during this admission.   We discussed the need for good hydration, monitoring of hydration status, avoidance of heat, cold, stress, and infection triggers. We discussed the need to be adherent with taking his home medications. Patient was reminded of the need to seek medical attention immediately if any symptom of bleeding, anemia, or infection occurs.  Discharge Exam: Vitals:   03/20/24 0820 03/20/24 0832  BP:  115/79  Pulse:  78  Resp: 18 19  Temp:  98.1 F (36.7 C)  SpO2:  100%   Vitals:   03/20/24 0355 03/20/24 0423 03/20/24 0820 03/20/24 0832  BP:  (!) 126/92  115/79  Pulse:  97  78  Resp: 16 18 18  19  Temp:  98.3 F (36.8 C)  98.1 F (36.7 C)  TempSrc:    Oral  SpO2: 100% 100%  100%  Weight:      Height:        General appearance : Awake, alert, not in any distress. Speech Clear. Not toxic looking HEENT: Atraumatic and Normocephalic, pupils equally reactive to light and accomodation Neck: Supple, no JVD. No cervical lymphadenopathy.  Chest: Good air entry bilaterally, no added sounds  CVS: S1 S2 regular, no murmurs.  Abdomen: Bowel sounds present, Non tender and not distended with no gaurding, rigidity or rebound. Extremities: B/L Lower Ext shows no edema, both legs are warm to touch Neurology: Awake alert, and oriented X 3, CN II-XII intact, Non focal Skin: No  Rash  Discharge Instructions  Discharge Instructions     Call MD for:  severe uncontrolled pain   Complete by: As directed    Call MD for:  temperature >100.4   Complete by: As directed    Diet - low sodium heart healthy   Complete by: As directed    Increase activity slowly   Complete by: As directed       Allergies as of 03/20/2024   No Known Allergies      Medication List     TAKE these medications    amitriptyline 25 MG tablet Commonly known as: ELAVIL Take 25 mg by mouth at bedtime.   CALCIUM PO Take 1 tablet by mouth daily.   cholecalciferol  25 MCG (1000 UNIT) tablet Commonly known as: VITAMIN D3 Take 1,000 Units by mouth daily.   folic acid  1 MG tablet Commonly known as: FOLVITE  Take 1 tablet (1 mg total) by mouth daily.   hydroxyurea  500 MG capsule Commonly known as: HYDREA  Take 3 capsules (1,500 mg total) by mouth daily. May take with food to minimize GI side effects.   lisinopril  2.5 MG tablet Commonly known as: ZESTRIL  Take 2.5 mg by mouth daily.   metoCLOPramide  10 MG tablet Commonly known as: REGLAN  Take 1 tablet (10 mg total) by mouth 3 (three) times daily with meals. What changed:  when to take this reasons to take this   ondansetron  4 MG disintegrating tablet Commonly known as: ZOFRAN -ODT 4mg  ODT q4 hours prn nausea/vomit What changed:  how much to take how to take this when to take this additional instructions   oxyCODONE  5 MG immediate release tablet Commonly known as: Oxy IR/ROXICODONE  Take 1 tablet (5 mg total) by mouth every 6 (six) hours as needed for severe pain (pain score 7-10). What changed:  how much to take when to take this reasons to take this   pantoprazole 40 MG tablet Commonly known as: PROTONIX Take 40 mg by mouth daily before breakfast.   promethazine 12.5 MG tablet Commonly known as: PHENERGAN Take 12.5 mg by mouth every 8 (eight) hours as needed for nausea or vomiting.   pseudoephedrine 120 MG 12 hr  tablet Commonly known as: SUDAFED Take 120 mg by mouth every 12 (twelve) hours as needed for congestion.        The results of significant diagnostics from this hospitalization (including imaging, microbiology, ancillary and laboratory) are listed below for reference.    Significant Diagnostic Studies: DG ABD ACUTE 2+V W 1V CHEST Result Date: 03/12/2024 CLINICAL DATA:  [id number]. Nausea and vomiting. Sickle cell pain crisis. Back pain and abdominal pain. EXAM: DG ABDOMEN ACUTE WITH 1 VIEW CHEST COMPARISON:  CT abdomen and pelvis with contrast 01/14/2024 and portable chest  01/14/2024. FINDINGS: There is no evidence of dilated bowel loops or free intraperitoneal air. There is mild fecal stasis. No radiopaque calculi or other significant radiographic abnormality is seen. There are cholecystectomy clips. Four threaded left hip pins. Heart size and mediastinal contours are within normal limits. Both lungs are clear. Thoracic spine fusion hardware is again noted with upper thoracic levoscoliosis. IMPRESSION: Negative abdominal radiographs.  No acute cardiopulmonary disease. Electronically Signed   By: Francis Quam M.D.   On: 03/12/2024 01:02    Microbiology: No results found for this or any previous visit (from the past 240 hours).   Labs: Basic Metabolic Panel: No results for input(s): NA, K, CL, CO2, GLUCOSE, BUN, CREATININE, CALCIUM, MG, PHOS in the last 168 hours.  Liver Function Tests: No results for input(s): AST, ALT, ALKPHOS, BILITOT, PROT, ALBUMIN in the last 168 hours.  No results for input(s): LIPASE, AMYLASE in the last 168 hours. No results for input(s): AMMONIA in the last 168 hours. CBC: No results for input(s): WBC, NEUTROABS, HGB, HCT, MCV, PLT in the last 168 hours.  Cardiac Enzymes: No results for input(s): CKTOTAL, CKMB, CKMBINDEX, TROPONINI in the last 168 hours. BNP: Invalid input(s): POCBNP CBG: No results  for input(s): GLUCAP in the last 168 hours.  Time coordinating discharge: 50 minutes  Signed:  [name] Cover NP    03/26/2024, 3:53 PM

## 2024-03-22 DIAGNOSIS — D5700 Hb-SS disease with crisis, unspecified: Secondary | ICD-10-CM | POA: Diagnosis not present

## 2024-04-04 DIAGNOSIS — Z79899 Other long term (current) drug therapy: Secondary | ICD-10-CM | POA: Diagnosis not present

## 2024-04-04 DIAGNOSIS — D571 Sickle-cell disease without crisis: Secondary | ICD-10-CM | POA: Diagnosis not present

## 2024-04-04 DIAGNOSIS — Z5181 Encounter for therapeutic drug level monitoring: Secondary | ICD-10-CM | POA: Diagnosis not present

## 2024-04-25 ENCOUNTER — Encounter (HOSPITAL_BASED_OUTPATIENT_CLINIC_OR_DEPARTMENT_OTHER): Payer: Self-pay

## 2024-04-25 ENCOUNTER — Other Ambulatory Visit: Payer: Self-pay

## 2024-04-25 ENCOUNTER — Emergency Department (HOSPITAL_BASED_OUTPATIENT_CLINIC_OR_DEPARTMENT_OTHER)
Admission: EM | Admit: 2024-04-25 | Discharge: 2024-04-25 | Disposition: A | Discharge status: Home / Self Care | Attending: Emergency Medicine | Admitting: Emergency Medicine

## 2024-04-25 DIAGNOSIS — D5700 Hb-SS disease with crisis, unspecified: Secondary | ICD-10-CM | POA: Insufficient documentation

## 2024-04-25 DIAGNOSIS — R112 Nausea with vomiting, unspecified: Secondary | ICD-10-CM | POA: Diagnosis not present

## 2024-04-25 DIAGNOSIS — Z79899 Other long term (current) drug therapy: Secondary | ICD-10-CM | POA: Diagnosis not present

## 2024-04-25 DIAGNOSIS — R9431 Abnormal electrocardiogram [ECG] [EKG]: Secondary | ICD-10-CM | POA: Diagnosis not present

## 2024-04-25 DIAGNOSIS — R7309 Other abnormal glucose: Secondary | ICD-10-CM | POA: Insufficient documentation

## 2024-04-25 LAB — COMPREHENSIVE METABOLIC PANEL WITH GFR
ALT: 14 U/L (ref 0–44)
AST: 41 U/L (ref 15–41)
Albumin: 4.5 g/dL (ref 3.5–5.0)
Alkaline Phosphatase: 86 U/L (ref 38–126)
Anion gap: 12 (ref 5–15)
BUN: 6 mg/dL (ref 6–20)
CO2: 21 mmol/L — ABNORMAL LOW (ref 22–32)
Calcium: 9.5 mg/dL (ref 8.9–10.3)
Chloride: 110 mmol/L (ref 98–111)
Creatinine, Ser: 0.89 mg/dL (ref 0.61–1.24)
GFR, Estimated: >60 mL/min (ref >60)
Glucose, Bld: 106 mg/dL — ABNORMAL HIGH (ref 70–99)
Potassium: 3.8 mmol/L (ref 3.5–5.1)
Sodium: 143 mmol/L (ref 135–145)
Total Bilirubin: 1.7 mg/dL — ABNORMAL HIGH (ref 0.0–1.2)
Total Protein: 8.5 g/dL — ABNORMAL HIGH (ref 6.5–8.1)

## 2024-04-25 LAB — CBC WITH DIFFERENTIAL/PLATELET
Abs Immature Granulocytes: 0.04 K/uL (ref 0.00–0.07)
Basophils Absolute: 0 K/uL (ref 0.0–0.1)
Basophils Relative: 0 %
Eosinophils Absolute: 0 K/uL (ref 0.0–0.5)
Eosinophils Relative: 0 %
HCT: 24.3 % — ABNORMAL LOW (ref 39.0–52.0)
Hemoglobin: 9 g/dL — ABNORMAL LOW (ref 13.0–17.0)
Immature Granulocytes: 0 %
Lymphocytes Relative: 12 %
Lymphs Abs: 1.4 K/uL (ref 0.7–4.0)
MCH: 39.1 pg — ABNORMAL HIGH (ref 26.0–34.0)
MCHC: 37 g/dL — ABNORMAL HIGH (ref 30.0–36.0)
MCV: 105.7 fL — ABNORMAL HIGH (ref 80.0–100.0)
Monocytes Absolute: 1 K/uL (ref 0.1–1.0)
Monocytes Relative: 9 %
Neutro Abs: 8.8 K/uL — ABNORMAL HIGH (ref 1.7–7.7)
Neutrophils Relative %: 79 %
Platelets: 411 K/uL — ABNORMAL HIGH (ref 150–400)
RBC: 2.3 MIL/uL — ABNORMAL LOW (ref 4.22–5.81)
RDW: 17.5 % — ABNORMAL HIGH (ref 11.5–15.5)
Smear Review: NORMAL
WBC: 11.2 K/uL — ABNORMAL HIGH (ref 4.0–10.5)
nRBC: 1 % — ABNORMAL HIGH (ref 0.0–0.2)

## 2024-04-25 LAB — RETICULOCYTES
Immature Retic Fract: 26.2 % — ABNORMAL HIGH (ref 2.3–15.9)
RBC.: 2.38 MIL/uL — ABNORMAL LOW (ref 4.22–5.81)
Retic Count, Absolute: 149.9 K/uL (ref 19.0–186.0)
Retic Ct Pct: 6.3 % — ABNORMAL HIGH (ref 0.4–3.1)

## 2024-04-25 MED ORDER — HYDROMORPHONE HCL 1 MG/ML IJ SOLN
2.0000 mg | INTRAMUSCULAR | Status: AC
  Administered 2024-04-25: 2 mg via INTRAVENOUS
  Filled 2024-04-25: qty 2

## 2024-04-25 MED ORDER — DIPHENHYDRAMINE HCL 25 MG PO CAPS: 25.0000 mg | ORAL_CAPSULE | ORAL | Status: DC | PRN

## 2024-04-25 MED ORDER — HYDROCORTISONE SOD SUC (PF) 100 MG IJ SOLR
100.0000 mg | Freq: Once | INTRAMUSCULAR | Status: AC
  Administered 2024-04-25: 100 mg via INTRAVENOUS
  Filled 2024-04-25: qty 2

## 2024-04-25 MED ORDER — ONDANSETRON HCL 4 MG/2ML IJ SOLN
4.0000 mg | Freq: Once | INTRAMUSCULAR | Status: AC
  Administered 2024-04-25: 4 mg via INTRAVENOUS
  Filled 2024-04-25: qty 2

## 2024-04-25 MED ORDER — SODIUM CHLORIDE 0.45 % IV SOLN: INTRAVENOUS | Status: DC

## 2024-04-25 MED ORDER — ONDANSETRON 4 MG PO TBDP: 4.0000 mg | ORAL_TABLET | Freq: Three times a day (TID) | ORAL | 0 refills | Status: AC | PRN

## 2024-04-25 NOTE — Discharge Instructions (Addendum)
 Please read and follow all provided instructions.  Your diagnoses today include:  1. Sickle cell pain crisis (HCC)   2. Nausea and vomiting, unspecified vomiting type     Tests performed today include: Complete blood cell count: Your hemoglobin today was 9.2 which appears to be close to your baseline Basic metabolic panel:  Reticulocytes Vital signs. See below for your results today.   Medications prescribed:  Zofran  (ondansetron ) - for nausea and vomiting  Take any prescribed medications only as directed.  Home care instructions:  Follow any educational materials contained in this packet.  Use medication for nausea and vomiting.  Adhere to a bland diet and maintain good hydration over the next 24 hours.  Continue home pain medication regimen, continue home hydrocortisone .  Follow-up instructions: Please follow-up with your primary care provider in the next 2 days for further evaluation of your symptoms.   Return instructions:  Please return to the Emergency Department if you experience worsening symptoms.  Return to the nearest emergency department with persistent nausea or vomiting, uncontrolled pain Please return if you have any other emergent concerns.  Additional Information:  Your vital signs today were: BP 124/85   Pulse 95   Temp 99 F (37.2 C) (Oral)   Resp 18   Wt 77.1 kg   SpO2 94%   BMI 26.63 kg/m  If your blood pressure (BP) was elevated above 135/85 this visit, please have this repeated by your doctor within one month. --------------

## 2024-04-25 NOTE — ED Triage Notes (Signed)
 Reports sickle crisis pain that started yesterday. Reports lower back & generalized abdominal pain Endorses emesis. Denies Cp/SOB

## 2024-04-25 NOTE — ED Notes (Signed)
 Pt. Reports pain in lower back started yesterday and vomiting as well.  He reports vomiting x 3 in last 24 hours.

## 2024-04-25 NOTE — ED Provider Notes (Signed)
 [place] [facility] AT [facility] Provider Note   CSN: [id number] Arrival date & time: 04/25/24  1207     Patient presents with: Sickle Cell Pain Crisis   Manuel Mcguire is a 33 y.o. male.   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  Patient reports pain in his lower back.  This is typical.  He has also had vomiting starting this morning.  He has been unable to take his medication and hydrocortisone due to vomiting.  Currently taking 20 mg hydrocortisone in the morning and 10 mg in the afternoon.  He states that his nausea and vomiting has been much improved over the past month since starting this regimen.  Patient was given 100 mg IM to use at home for stress dosing when I get really sick however he has not taken this.  He denies fever or other infectious symptoms.  No chest pain or shortness of breath.  No joint swelling.  Patient states that he has a work trip at 5 PM today and he wanted to come in to try to get symptoms better controlled.       Prior to Admission medications   Medication Sig Start Date End Date Taking? Authorizing Provider  amitriptyline (ELAVIL) 25 MG tablet Take 25 mg by mouth at bedtime.    [provider]  CALCIUM PO Take 1 tablet by mouth daily.    [provider]  cholecalciferol  (VITAMIN D3) 25 MCG (1000 UNIT) tablet Take 1,000 Units by mouth daily.    [provider]  folic acid  (FOLVITE ) 1 MG tablet Take 1 tablet (1 mg total) by mouth daily. 10/27/12   Alvia Rosaline LABOR, MD  hydroxyurea  (HYDREA ) 500 MG capsule Take 3 capsules (1,500 mg total) by mouth daily. May take with food to minimize GI side effects. 10/27/12   Alvia Rosaline LABOR, MD  lisinopril   (ZESTRIL ) 2.5 MG tablet Take 2.5 mg by mouth daily.    [provider]  metoCLOPramide  (REGLAN ) 10 MG tablet Take 1 tablet (10 mg total) by mouth 3 (three) times daily with meals. Patient taking differently: Take 10 mg by mouth 3 (three) times daily as needed for nausea or vomiting (take with food). 12/02/23 12/01/24  Ijaola, Onyeje M, NP  ondansetron  (ZOFRAN -ODT) 4 MG disintegrating tablet 4mg  ODT q4 hours prn nausea/vomit Patient taking differently: Take 4 mg by mouth See admin instructions. Dissolve 4 mg in the mouth in the morning and at bedtime 11/06/23   Patt Alm Macho, MD  oxyCODONE  (OXY IR/ROXICODONE ) 5 MG immediate release tablet Take 1 tablet (5 mg total) by mouth every 6 (six) hours as needed for severe pain (pain score 7-10). Patient taking differently: Take 5-10 mg by mouth every 4 (four) hours as needed for moderate pain (pain score 4-6) or severe pain (pain score 7-10). 12/02/23   Ijaola, Onyeje M, NP  pantoprazole (PROTONIX) 40 MG tablet Take 40 mg by mouth daily before breakfast.    [provider]  promethazine (PHENERGAN) 12.5 MG tablet Take 12.5 mg by mouth every 8 (eight) hours as needed for nausea or vomiting. 12/27/23   [provider]  pseudoephedrine (SUDAFED) 120 MG 12 hr tablet Take 120 mg by mouth every 12 (twelve) hours as needed for congestion.    [provider]    Allergies: Patient has no known allergies.    Review of Systems  Updated Vital Signs BP (!) 134/95 (BP Location: Right Arm)   Pulse 95   Temp 99 F (37.2 C) (Oral)   Resp 18   SpO2 95%   Physical Exam Vitals and nursing note reviewed.  Constitutional:      General: He is not in acute distress.    Appearance: He is well-developed.  HENT:     Head: Normocephalic and atraumatic.     Nose: Nose normal.     Mouth/Throat:     Mouth: Mucous membranes are moist.  Eyes:     General:        Right eye: No discharge.        Left eye: No discharge.      Conjunctiva/sclera: Conjunctivae normal.  Cardiovascular:     Rate and Rhythm: Normal rate and regular rhythm.     Heart sounds: Normal heart sounds.  Pulmonary:     Effort: Pulmonary effort is normal.     Breath sounds: Normal breath sounds.  Abdominal:     Palpations: Abdomen is soft.     Tenderness: There is no abdominal tenderness.  Musculoskeletal:     Cervical back: Normal range of motion and neck supple. No tenderness.     Thoracic back: No tenderness.     Lumbar back: No tenderness.  Skin:    General: Skin is warm and dry.  Neurological:     Mental Status: He is alert.     (all labs ordered are listed, but only abnormal results are displayed) Labs Reviewed  COMPREHENSIVE METABOLIC PANEL WITH GFR - Abnormal; Notable for the following components:      Result Value   CO2 21 (*)    Glucose, Bld 106 (*)    Total Protein 8.5 (*)    Total Bilirubin 1.7 (*)    All other components within normal limits  CBC WITH DIFFERENTIAL/PLATELET - Abnormal; Notable for the following components:   WBC 11.2 (*)    RBC 2.30 (*)    Hemoglobin 9.0 (*)    HCT 24.3 (*)    MCV 105.7 (*)    MCH 39.1 (*)    MCHC 37.0 (*)    RDW 17.5 (*)    Platelets 411 (*)    nRBC 1.0 (*)    Neutro Abs 8.8 (*)    All other components within normal limits  RETICULOCYTES - Abnormal; Notable for the following components:   Retic Ct Pct 6.3 (*)    RBC. 2.38 (*)    Immature Retic Fract 26.2 (*)    All other components within normal limits    EKG: None  Radiology: No results found.   Procedures   Medications Ordered in the ED  0.45 % sodium chloride  infusion ( Intravenous New Bag/Given 04/25/24 1346)  diphenhydrAMINE  (BENADRYL ) capsule 25-50 mg (has no administration in time range)  HYDROmorphone  (DILAUDID ) injection 2 mg (2 mg Intravenous Given 04/25/24 1334)  HYDROmorphone  (DILAUDID ) injection 2 mg (2 mg Intravenous Given 04/25/24 1555)  HYDROmorphone  (DILAUDID ) injection 2 mg (2 mg Intravenous  Given 04/25/24 1441)  ondansetron  (ZOFRAN ) injection 4 mg (4 mg Intravenous Given 04/25/24 1333)  hydrocortisone sodium succinate (SOLU-CORTEF) 100 MG injection 100 mg (100 mg Intravenous Given 04/25/24 1339)   ED Course  Patient seen and examined. History obtained directly from patient.   Labs/EKG: CBC, CMP, reticulocytes ordered in triage  Imaging: None ordered  Medications/Fluids: Ordered:  IV hydromorphone  per protocol, IV Zofran , IV hydrocortisone 100 mg.  IV fluids.  Most recent vital signs reviewed and are as follows: BP (!) 134/95 (BP Location: Right Arm)   Pulse 95   Temp 99 F (37.2 C) (Oral)   Resp 18   Wt 77.1 kg   SpO2 95%   BMI 26.63 kg/m   Initial impression: Patient with uncontrolled sickle cell pain in lower back.  This is typical.  No evidence of acute chest syndrome.  Vital signs are stable.  He has been vomiting again so will hydrate and give antiemetics and stress dose of hydrocortisone per plan from [facility] discharge summary 03/22/2024.  3:25 PM Reassessment performed. Patient appears improved.  He is tolerating ginger ale.  Urine at bedside, dark.  Receiving IV fluids.  States that his pain is much better controlled, vomiting and nausea better controlled.  He would like third dose of Dilaudid  and then he feels well enough to be discharged.  Labs personally reviewed and interpreted including: CBC shows hemoglobin of 9.0 which is near baseline, macrocytic, white blood cell count 11.2 which is typically elevated; CMP total bili minimally elevated at 1.7, glucose 106 otherwise unremarkable; reticulate's are appropriate.  Reviewed pertinent lab work and imaging with patient at bedside. Questions answered.   Most current vital signs reviewed and are as follows: BP 124/85   Pulse 95   Temp 99 F (37.2 C) (Oral)   Resp 18   Wt 77.1 kg   SpO2 94%   BMI 26.63 kg/m   Plan: Discharge to home.   Prescriptions written for: Zofran   Other home care instructions  discussed: Maintain hydration, continue home medications  ED return instructions discussed: Return with recurrent vomiting, uncontrolled pain  Follow-up instructions discussed: Patient encouraged to follow-up with their PCP in 2 days.   4:00 PM Patient being discharged.                                    Medical Decision Making Amount and/or Complexity of Data Reviewed Labs: ordered.  Risk Prescription drug management.   Patient presents with lower back pain in setting of sickle cell anemia.  Patient has a known secondary adrenal insufficiency, has not had his oral steroids since yesterday.  Patient was given stress dose of steroids here.  He has not been hypotensive.  No chest pain or shortness of breath to suggest acute chest, PE.  He is clinically improving in the emergency department with treatment.  He is tolerating orals.  He would like to be discharged to continue home medications.       Final diagnoses:  Sickle cell pain crisis (HCC)  Nausea and vomiting, unspecified vomiting type    ED Discharge Orders     None          Desiderio Chew, PA-C 04/25/24 1612    Patsey Lot, MD 04/26/24 1441

## 2024-04-25 NOTE — ED Notes (Signed)
 Pt. Resting at present time.

## 2024-04-25 NOTE — ED Notes (Signed)
Sickle Cell crisis

## 2024-04-27 ENCOUNTER — Encounter (HOSPITAL_BASED_OUTPATIENT_CLINIC_OR_DEPARTMENT_OTHER): Payer: Self-pay

## 2024-04-27 ENCOUNTER — Other Ambulatory Visit: Payer: Self-pay

## 2024-04-27 ENCOUNTER — Emergency Department (HOSPITAL_BASED_OUTPATIENT_CLINIC_OR_DEPARTMENT_OTHER)
Admission: EM | Admit: 2024-04-27 | Discharge: 2024-04-27 | Disposition: A | Discharge status: Home / Self Care | Attending: Emergency Medicine | Admitting: Emergency Medicine

## 2024-04-27 ENCOUNTER — Emergency Department (HOSPITAL_BASED_OUTPATIENT_CLINIC_OR_DEPARTMENT_OTHER)

## 2024-04-27 DIAGNOSIS — D649 Anemia, unspecified: Secondary | ICD-10-CM | POA: Diagnosis not present

## 2024-04-27 DIAGNOSIS — D72829 Elevated white blood cell count, unspecified: Secondary | ICD-10-CM | POA: Insufficient documentation

## 2024-04-27 DIAGNOSIS — D5700 Hb-SS disease with crisis, unspecified: Secondary | ICD-10-CM | POA: Diagnosis not present

## 2024-04-27 DIAGNOSIS — R112 Nausea with vomiting, unspecified: Secondary | ICD-10-CM | POA: Insufficient documentation

## 2024-04-27 DIAGNOSIS — R079 Chest pain, unspecified: Secondary | ICD-10-CM | POA: Diagnosis not present

## 2024-04-27 LAB — COMPREHENSIVE METABOLIC PANEL WITH GFR
ALT: 14 U/L (ref 0–44)
AST: 32 U/L (ref 15–41)
Albumin: 4.7 g/dL (ref 3.5–5.0)
Alkaline Phosphatase: 92 U/L (ref 38–126)
Anion gap: 13 (ref 5–15)
BUN: 10 mg/dL (ref 6–20)
CO2: 22 mmol/L (ref 22–32)
Calcium: 9.4 mg/dL (ref 8.9–10.3)
Chloride: 107 mmol/L (ref 98–111)
Creatinine, Ser: 0.9 mg/dL (ref 0.61–1.24)
GFR, Estimated: >60 mL/min (ref >60)
Glucose, Bld: 105 mg/dL — ABNORMAL HIGH (ref 70–99)
Potassium: 3.6 mmol/L (ref 3.5–5.1)
Sodium: 142 mmol/L (ref 135–145)
Total Bilirubin: 2.3 mg/dL — ABNORMAL HIGH (ref 0.0–1.2)
Total Protein: 8.4 g/dL — ABNORMAL HIGH (ref 6.5–8.1)

## 2024-04-27 LAB — CBC WITH DIFFERENTIAL/PLATELET
Abs Immature Granulocytes: 0.05 K/uL (ref 0.00–0.07)
Basophils Absolute: 0 K/uL (ref 0.0–0.1)
Basophils Relative: 0 %
Eosinophils Absolute: 0.1 K/uL (ref 0.0–0.5)
Eosinophils Relative: 0 %
HCT: 24.5 % — ABNORMAL LOW (ref 39.0–52.0)
Hemoglobin: 8.9 g/dL — ABNORMAL LOW (ref 13.0–17.0)
Immature Granulocytes: 0 %
Lymphocytes Relative: 36 %
Lymphs Abs: 4.5 K/uL — ABNORMAL HIGH (ref 0.7–4.0)
MCH: 37.7 pg — ABNORMAL HIGH (ref 26.0–34.0)
MCHC: 36.3 g/dL — ABNORMAL HIGH (ref 30.0–36.0)
MCV: 103.8 fL — ABNORMAL HIGH (ref 80.0–100.0)
Monocytes Absolute: 2.1 K/uL — ABNORMAL HIGH (ref 0.1–1.0)
Monocytes Relative: 17 %
Neutro Abs: 5.8 K/uL (ref 1.7–7.7)
Neutrophils Relative %: 47 %
Platelets: 374 K/uL (ref 150–400)
RBC: 2.36 MIL/uL — ABNORMAL LOW (ref 4.22–5.81)
RDW: 17 % — ABNORMAL HIGH (ref 11.5–15.5)
Smear Review: NORMAL
WBC: 12.6 K/uL — ABNORMAL HIGH (ref 4.0–10.5)
nRBC: 0.7 % — ABNORMAL HIGH (ref 0.0–0.2)

## 2024-04-27 LAB — RETICULOCYTES
Immature Retic Fract: 39.6 % — ABNORMAL HIGH (ref 2.3–15.9)
RBC.: 2.34 MIL/uL — ABNORMAL LOW (ref 4.22–5.81)
Retic Count, Absolute: 119.6 K/uL (ref 19.0–186.0)
Retic Ct Pct: 5.1 % — ABNORMAL HIGH (ref 0.4–3.1)

## 2024-04-27 MED ORDER — HYDROCORTISONE SOD SUC (PF) 100 MG IJ SOLR
100.0000 mg | Freq: Once | INTRAMUSCULAR | Status: AC
  Administered 2024-04-27: 100 mg via INTRAVENOUS
  Filled 2024-04-27: qty 2

## 2024-04-27 MED ORDER — HYDROMORPHONE HCL 1 MG/ML IJ SOLN
2.0000 mg | INTRAMUSCULAR | Status: AC
  Administered 2024-04-27: 2 mg via INTRAVENOUS
  Filled 2024-04-27: qty 2

## 2024-04-27 MED ORDER — ONDANSETRON HCL 4 MG/2ML IJ SOLN
4.0000 mg | INTRAMUSCULAR | Status: DC | PRN
  Administered 2024-04-27: 4 mg via INTRAVENOUS
  Filled 2024-04-27: qty 2

## 2024-04-27 MED ORDER — DIPHENHYDRAMINE HCL 50 MG/ML IJ SOLN
INTRAMUSCULAR | Status: AC
  Filled 2024-04-27: qty 1

## 2024-04-27 MED ORDER — SODIUM CHLORIDE 0.9 % IV SOLN
12.5000 mg | Freq: Once | INTRAVENOUS | Status: AC
  Administered 2024-04-27: 12.5 mg via INTRAVENOUS
  Filled 2024-04-27: qty 0.25

## 2024-04-27 NOTE — Discharge Instructions (Signed)
 It was a pleasure taking care of you here today.  Continue taking your home medications and follow-up with the sickle cell clinic  Make sure to follow-up outpatient, return for any worsening symptoms such as chest pain, shortness of breath, persistent nausea and vomiting

## 2024-04-27 NOTE — ED Notes (Signed)
 Patient transferred from waiting room to ED treatment room. Assuming pt care at this time.

## 2024-04-27 NOTE — ED Provider Notes (Signed)
 [facility] Provider Note   CSN: [id number] Arrival date & time: 04/27/24  1814    Patient presents with: Sickle Cell Pain Crisis   Manuel Mcguire is a 33 y.o. male here with lower back pain.  Feels consistent with his prior sickle cell crisis.  He is also having intermittent nausea and vomiting over the last day unable to keep down his home meds.  He had a recent admission at [facility] was diagnosed with secondary adrenal insufficiency.  He was concerned that he is unable to keep down his medicines and that he needed a shot of steroids.  No numbness or weakness.  No fever, chest pain, shortness of breath, abdominal pain.  No dysuria or hematuria.  No pain or swelling to lower legs.   HPI     Prior to Admission medications   Medication Sig Start Date End Date Taking? Authorizing Provider  amitriptyline (ELAVIL) 25 MG tablet Take 25 mg by mouth at bedtime.    [provider]  CALCIUM PO Take 1 tablet by mouth daily.    [provider]  cholecalciferol  (VITAMIN D3) 25 MCG (1000 UNIT) tablet Take 1,000 Units by mouth daily.    [provider]  folic acid  (FOLVITE ) 1 MG tablet Take 1 tablet (1 mg total) by mouth daily. 10/27/12   Alvia Rosaline LABOR, MD  hydroxyurea  (HYDREA ) 500 MG capsule Take 3 capsules (1,500 mg total) by mouth daily. May take with food to minimize GI side effects. 10/27/12   Alvia Rosaline LABOR, MD  lisinopril  (ZESTRIL ) 2.5 MG tablet Take 2.5 mg by mouth daily.    [provider]  metoCLOPramide  (REGLAN ) 10 MG tablet Take 1 tablet (10 mg total) by mouth 3 (three) times daily with meals. Patient taking differently: Take 10 mg by mouth 3 (three) times daily as needed for nausea or vomiting (take with food). 12/02/23 12/01/24  Ijaola, Onyeje M, NP  ondansetron  (ZOFRAN -ODT) 4 MG disintegrating tablet Take 1 tablet (4 mg total) by mouth every 8 (eight) hours as needed for nausea or vomiting. 04/25/24    Desiderio Chew, PA-C  oxyCODONE  (OXY IR/ROXICODONE ) 5 MG immediate release tablet Take 1 tablet (5 mg total) by mouth every 6 (six) hours as needed for severe pain (pain score 7-10). Patient taking differently: Take 5-10 mg by mouth every 4 (four) hours as needed for moderate pain (pain score 4-6) or severe pain (pain score 7-10). 12/02/23   Ijaola, Onyeje M, NP  pantoprazole (PROTONIX) 40 MG tablet Take 40 mg by mouth daily before breakfast.    [provider]  promethazine (PHENERGAN) 12.5 MG tablet Take 12.5 mg by mouth every 8 (eight) hours as needed for nausea or vomiting. 12/27/23   [provider]  pseudoephedrine (SUDAFED) 120 MG 12 hr tablet Take 120 mg by mouth every 12 (twelve) hours as needed for congestion.    [provider]    Allergies: Patient has no known allergies.    Review of Systems  Constitutional: Negative.   HENT: Negative.    Respiratory: Negative.    Cardiovascular: Negative.   Gastrointestinal:  Positive for nausea and vomiting. Negative for abdominal pain, constipation and diarrhea.  Genitourinary: Negative.   Musculoskeletal:  Positive for back pain. Negative for arthralgias, neck pain and neck stiffness.       Lower back pain  Skin: Negative.   Neurological: Negative.   All other systems reviewed and are negative.  Updated Vital Signs BP  120/70 (BP Location: Right Arm)   Pulse 81   Temp 98.9 F (37.2 C) (Oral)   Resp 16   Ht 5' 7 (1.702 m)   Wt 77.1 kg   SpO2 98%   BMI 26.63 kg/m   Physical Exam Vitals and nursing note reviewed.  Constitutional:      General: He is not in acute distress.    Appearance: He is well-developed. He is not ill-appearing, toxic-appearing or diaphoretic.  HENT:     Head: Atraumatic.  Eyes:     Pupils: Pupils are equal, round, and reactive to light.  Cardiovascular:     Rate and Rhythm: Normal rate and regular rhythm.     Pulses: Normal pulses.     Heart sounds: Normal heart sounds.   Pulmonary:     Effort: Pulmonary effort is normal. No respiratory distress.     Breath sounds: Normal breath sounds.  Abdominal:     General: Bowel sounds are normal. There is no distension.     Palpations: Abdomen is soft.     Tenderness: There is no abdominal tenderness. There is no guarding or rebound.  Musculoskeletal:        General: Normal range of motion.     Cervical back: Normal range of motion and neck supple.     Comments: No bony tenderness, compartment soft, full range of motion  Skin:    General: Skin is warm and dry.     Capillary Refill: Capillary refill takes less than 2 seconds.     Comments: No rashes or lesions on exposed skin  Neurological:     General: No focal deficit present.     Mental Status: He is alert and oriented to person, place, and time.    (all labs ordered are listed, but only abnormal results are displayed) Labs Reviewed  COMPREHENSIVE METABOLIC PANEL WITH GFR - Abnormal; Notable for the following components:      Result Value   Glucose, Bld 105 (*)    Total Protein 8.4 (*)    Total Bilirubin 2.3 (*)    All other components within normal limits  CBC WITH DIFFERENTIAL/PLATELET - Abnormal; Notable for the following components:   WBC 12.6 (*)    RBC 2.36 (*)    Hemoglobin 8.9 (*)    HCT 24.5 (*)    MCV 103.8 (*)    MCH 37.7 (*)    MCHC 36.3 (*)    RDW 17.0 (*)    nRBC 0.7 (*)    Lymphs Abs 4.5 (*)    Monocytes Absolute 2.1 (*)    All other components within normal limits  RETICULOCYTES - Abnormal; Notable for the following components:   Retic Ct Pct 5.1 (*)    RBC. 2.34 (*)    Immature Retic Fract 39.6 (*)    All other components within normal limits    EKG: None  Radiology: DG Chest 2 View Result Date: 04/27/2024 CLINICAL DATA:  Pain mixed field chest x-ray 03/12/2024 EXAM: CHEST - 2 VIEW COMPARISON:  None Available. FINDINGS: The heart size and mediastinal contours are within normal limits. Both lungs are clear. Thoracic  spinal fusion hardware present. No acute osseous abnormality. IMPRESSION: No active cardiopulmonary disease. Electronically Signed   By: Greig Pique M.D.   On: 04/27/2024 19:02     Procedures   Medications Ordered in the ED  ondansetron  (ZOFRAN ) injection 4 mg (4 mg Intravenous Given 04/27/24 1930)  diphenhydrAMINE  (BENADRYL ) 50 MG/ML injection (  Not Given  04/27/24 1934)  diphenhydrAMINE  (BENADRYL ) 12.5 mg in sodium chloride  0.9 % 50 mL IVPB (0 mg Intravenous Stopped 04/27/24 2010)  HYDROmorphone  (DILAUDID ) injection 2 mg (2 mg Intravenous Given 04/27/24 1933)  HYDROmorphone  (DILAUDID ) injection 2 mg (2 mg Intravenous Given 04/27/24 2008)  HYDROmorphone  (DILAUDID ) injection 2 mg (2 mg Intravenous Given 04/27/24 2041)  hydrocortisone  sodium succinate  (SOLU-CORTEF ) 100 MG injection 100 mg (100 mg Intravenous Given 04/27/24 10479)    33 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  Pain today feels consistent with his prior sickle cell crisis.  States recent diagnosis at [facility] for secondary adrenal insufficiency he was concerned as he has been unable to keep down his home meds.  Plan on labs, imaging, reassess  Labs and imaging personally viewed and interpreted:  CBC leukocytosis 12.6, hemoglobin 8.9, similar to baseline Metabolic pain T. bili 2.3 Chest x-ray without significant abnormality  Patient reassessed.  Still has some pain.  Vomiting improved.  Will give him additional doses of Dilaudid  per sickle cell order set.  Patient reassessed.  Pain controlled.  Tolerating p.o. intake.  Discussed labs and imaging.  Discussed admission versus home management.  Patient states he feels comfortable dcing home.  The patient has been appropriately medically screened and/or stabilized in the ED. I have low suspicion for any other emergent medical condition which would require further screening, evaluation or  treatment in the ED or require inpatient management.  Patient is hemodynamically stable and in no acute distress.  Patient able to ambulate in department prior to ED.  Evaluation does not show acute pathology that would require ongoing or additional emergent interventions while in the emergency department or further inpatient treatment.  I have discussed the diagnosis with the patient and answered all questions.  Pain is been managed while in the emergency department and patient has no further complaints prior to discharge.  Patient is comfortable with plan discussed in room and is stable for discharge at this time.  I have discussed strict return precautions for returning to the emergency department.  Patient was encouraged to follow-up with PCP/specialist refer to at discharge.                                    Medical Decision Making Amount and/or Complexity of Data Reviewed External Data Reviewed: labs, radiology and notes. Labs: ordered. Decision-making details documented in ED Course. Radiology: ordered and independent interpretation performed. Decision-making details documented in ED Course.  Risk OTC drugs. Prescription drug management. Decision regarding hospitalization. Diagnosis or treatment significantly limited by social determinants of health.       Final diagnoses:  Sickle cell pain crisis (HCC)  Nausea and vomiting, unspecified vomiting type    ED Discharge Orders     None          Janara Klett A, PA-C 04/27/24 2218    Dreama Longs, MD 04/28/24 1134

## 2024-04-27 NOTE — ED Triage Notes (Signed)
 Pt reports sickle cell pain x 1 day. Pt states that his pain is in his lower back. No relief with home oxy.

## 2024-04-27 NOTE — ED Notes (Signed)
 Pt transported to xray at this time

## 2024-05-12 ENCOUNTER — Emergency Department (HOSPITAL_BASED_OUTPATIENT_CLINIC_OR_DEPARTMENT_OTHER)
Admission: EM | Admit: 2024-05-12 | Discharge: 2024-05-13 | Disposition: A | Attending: Emergency Medicine | Admitting: Emergency Medicine

## 2024-05-12 ENCOUNTER — Emergency Department (HOSPITAL_BASED_OUTPATIENT_CLINIC_OR_DEPARTMENT_OTHER)

## 2024-05-12 ENCOUNTER — Encounter (HOSPITAL_BASED_OUTPATIENT_CLINIC_OR_DEPARTMENT_OTHER): Payer: Self-pay | Admitting: Urology

## 2024-05-12 DIAGNOSIS — Z981 Arthrodesis status: Secondary | ICD-10-CM | POA: Diagnosis not present

## 2024-05-12 DIAGNOSIS — D5700 Hb-SS disease with crisis, unspecified: Secondary | ICD-10-CM | POA: Insufficient documentation

## 2024-05-12 DIAGNOSIS — M79604 Pain in right leg: Secondary | ICD-10-CM

## 2024-05-12 DIAGNOSIS — M545 Low back pain, unspecified: Secondary | ICD-10-CM | POA: Insufficient documentation

## 2024-05-12 DIAGNOSIS — M79651 Pain in right thigh: Secondary | ICD-10-CM | POA: Insufficient documentation

## 2024-05-12 LAB — CBC WITH DIFFERENTIAL/PLATELET
Abs Immature Granulocytes: 0.17 K/uL — ABNORMAL HIGH (ref 0.00–0.07)
Basophils Absolute: 0.1 K/uL (ref 0.0–0.1)
Basophils Relative: 0 %
Eosinophils Absolute: 0.4 K/uL (ref 0.0–0.5)
Eosinophils Relative: 2 %
HCT: 19.6 % — ABNORMAL LOW (ref 39.0–52.0)
Hemoglobin: 7.2 g/dL — ABNORMAL LOW (ref 13.0–17.0)
Immature Granulocytes: 1 %
Lymphocytes Relative: 21 %
Lymphs Abs: 4.5 K/uL — ABNORMAL HIGH (ref 0.7–4.0)
MCH: 38.1 pg — ABNORMAL HIGH (ref 26.0–34.0)
MCHC: 36.7 g/dL — ABNORMAL HIGH (ref 30.0–36.0)
MCV: 103.7 fL — ABNORMAL HIGH (ref 80.0–100.0)
Monocytes Absolute: 2.1 K/uL — ABNORMAL HIGH (ref 0.1–1.0)
Monocytes Relative: 10 %
Neutro Abs: 14.6 K/uL — ABNORMAL HIGH (ref 1.7–7.7)
Neutrophils Relative %: 66 %
Platelets: 351 K/uL (ref 150–400)
RBC: 1.89 MIL/uL — ABNORMAL LOW (ref 4.22–5.81)
RDW: 20.3 % — ABNORMAL HIGH (ref 11.5–15.5)
Smear Review: NORMAL
WBC: 21.9 K/uL — ABNORMAL HIGH (ref 4.0–10.5)
nRBC: 3.7 % — ABNORMAL HIGH (ref 0.0–0.2)

## 2024-05-12 LAB — COMPREHENSIVE METABOLIC PANEL WITH GFR
ALT: 28 U/L (ref 0–44)
AST: 54 U/L — ABNORMAL HIGH (ref 15–41)
Albumin: 3.8 g/dL (ref 3.5–5.0)
Alkaline Phosphatase: 103 U/L (ref 38–126)
Anion gap: 10 (ref 5–15)
BUN: 21 mg/dL — ABNORMAL HIGH (ref 6–20)
CO2: 22 mmol/L (ref 22–32)
Calcium: 8.7 mg/dL — ABNORMAL LOW (ref 8.9–10.3)
Chloride: 108 mmol/L (ref 98–111)
Creatinine, Ser: 1.18 mg/dL (ref 0.61–1.24)
GFR, Estimated: >60 mL/min (ref >60)
Glucose, Bld: 108 mg/dL — ABNORMAL HIGH (ref 70–99)
Potassium: 4.3 mmol/L (ref 3.5–5.1)
Sodium: 140 mmol/L (ref 135–145)
Total Bilirubin: 1.3 mg/dL — ABNORMAL HIGH (ref 0.0–1.2)
Total Protein: 7.2 g/dL (ref 6.5–8.1)

## 2024-05-12 LAB — RETICULOCYTES
Immature Retic Fract: 40.5 % — ABNORMAL HIGH (ref 2.3–15.9)
RBC.: 1.89 MIL/uL — ABNORMAL LOW (ref 4.22–5.81)
Retic Count, Absolute: 154 K/uL (ref 19.0–186.0)
Retic Ct Pct: 8.2 % — ABNORMAL HIGH (ref 0.4–3.1)

## 2024-05-12 MED ORDER — DIPHENHYDRAMINE HCL 50 MG/ML IJ SOLN
25.0000 mg | Freq: Once | INTRAMUSCULAR | Status: AC
  Administered 2024-05-12: 25 mg via INTRAVENOUS
  Filled 2024-05-12: qty 1

## 2024-05-12 MED ORDER — SODIUM CHLORIDE 0.9 % IV BOLUS
1000.0000 mL | Freq: Once | INTRAVENOUS | Status: AC
  Administered 2024-05-12: 1000 mL via INTRAVENOUS

## 2024-05-12 MED ORDER — HYDROMORPHONE HCL 1 MG/ML IJ SOLN
1.0000 mg | Freq: Once | INTRAMUSCULAR | Status: AC
  Administered 2024-05-12: 1 mg via INTRAVENOUS
  Filled 2024-05-12: qty 1

## 2024-05-12 MED ORDER — HYDROMORPHONE HCL 1 MG/ML IJ SOLN
1.0000 mg | Freq: Once | INTRAMUSCULAR | Status: DC
  Filled 2024-05-12: qty 1

## 2024-05-12 NOTE — ED Provider Notes (Signed)
 " [facility] Provider Note   CSN: [id number] Arrival date & time: 05/12/24  1943     Patient presents with: Sickle Cell Pain Crisis   Manuel Mcguire is a 33 y.o. male.   Patient is a 33 year old male with past medical history of sickle cell disease presenting for complaints of right leg pain.  States the pain is in the femur.  Denies any chest pain or shortness of breath.  Denies any back pain.  Denies any skin color changes.  The history is provided by the patient. No language interpreter was used.  Sickle Cell Pain Crisis Associated symptoms: no chest pain, no cough, no fever, no shortness of breath, no sore throat and no vomiting        Prior to Admission medications  Medication Sig Start Date End Date Taking? Authorizing Provider  amitriptyline (ELAVIL) 25 MG tablet Take 25 mg by mouth at bedtime.    [provider]  CALCIUM PO Take 1 tablet by mouth daily.    [provider]  cholecalciferol  (VITAMIN D3) 25 MCG (1000 UNIT) tablet Take 1,000 Units by mouth daily.    [provider]  folic acid  (FOLVITE ) 1 MG tablet Take 1 tablet (1 mg total) by mouth daily. 10/27/12   Alvia Rosaline LABOR, MD  hydroxyurea  (HYDREA ) 500 MG capsule Take 3 capsules (1,500 mg total) by mouth daily. May take with food to minimize GI side effects. 10/27/12   Alvia Rosaline LABOR, MD  lisinopril  (ZESTRIL ) 2.5 MG tablet Take 2.5 mg by mouth daily.    [provider]  metoCLOPramide  (REGLAN ) 10 MG tablet Take 1 tablet (10 mg total) by mouth 3 (three) times daily with meals. Patient taking differently: Take 10 mg by mouth 3 (three) times daily as needed for nausea or vomiting (take with food). 12/02/23 12/01/24  Ijaola, Onyeje M, NP  ondansetron  (ZOFRAN -ODT) 4 MG disintegrating tablet Take 1 tablet (4 mg total) by mouth every 8 (eight) hours as needed for nausea or vomiting. 04/25/24   Desiderio Chew, PA-C  ondansetron   (ZOFRAN -ODT) 4 MG disintegrating tablet Take 1 tablet (4 mg total) by mouth every 8 (eight) hours as needed. 05/23/24   Griselda Norris, MD  oxyCODONE  (OXY IR/ROXICODONE ) 5 MG immediate release tablet Take 1 tablet (5 mg total) by mouth every 6 (six) hours as needed for severe pain (pain score 7-10). Patient taking differently: Take 5-10 mg by mouth every 4 (four) hours as needed for moderate pain (pain score 4-6) or severe pain (pain score 7-10). 12/02/23   Ijaola, Onyeje M, NP  pantoprazole (PROTONIX) 40 MG tablet Take 40 mg by mouth daily before breakfast.    [provider]  promethazine  (PHENERGAN ) 25 MG suppository Place 1 suppository (25 mg total) rectally every 6 (six) hours as needed for nausea or vomiting. 05/14/24   Trine Raynell Moder, MD  promethazine  (PHENERGAN ) 25 MG suppository Place 1 suppository (25 mg total) rectally every 6 (six) hours as needed for nausea or vomiting. 05/16/24   Keith, Kayla N, PA-C  pseudoephedrine (SUDAFED) 120 MG 12 hr tablet Take 120 mg by mouth every 12 (twelve) hours as needed for congestion.    [provider]    Allergies: Patient has no known allergies.    Review of Systems  Constitutional:  Negative for chills and fever.  HENT:  Negative for ear pain and sore throat.   Eyes:  Negative for pain and visual disturbance.  Respiratory:  Negative for cough and shortness of breath.   Cardiovascular:  Negative for chest pain and palpitations.  Gastrointestinal:  Negative for abdominal pain and vomiting.  Genitourinary:  Negative for dysuria and hematuria.  Musculoskeletal:  Negative for arthralgias and back pain.  Skin:  Negative for color change and rash.  Neurological:  Negative for seizures and syncope.  All other systems reviewed and are negative.   Updated Vital Signs BP 137/86   Pulse 83   Temp 98.8 F (37.1 C) (Oral)   Resp 20   Ht 5' 7 (1.702 m)   Wt 77.1 kg   SpO2 93%   BMI 26.62 kg/m   Physical Exam Vitals and  nursing note reviewed.  Constitutional:      General: He is not in acute distress.    Appearance: He is well-developed.  HENT:     Head: Normocephalic and atraumatic.  Eyes:     Conjunctiva/sclera: Conjunctivae normal.  Cardiovascular:     Rate and Rhythm: Normal rate and regular rhythm.     Pulses:          Dorsalis pedis pulses are 2+ on the right side and 2+ on the left side.     Heart sounds: No murmur heard. Pulmonary:     Effort: Pulmonary effort is normal. No respiratory distress.     Breath sounds: Normal breath sounds.  Abdominal:     Palpations: Abdomen is soft.     Tenderness: There is no abdominal tenderness.  Musculoskeletal:        General: No swelling.     Cervical back: Neck supple.  Skin:    General: Skin is warm and dry.     Capillary Refill: Capillary refill takes less than 2 seconds.     Findings: No rash.  Neurological:     Mental Status: He is alert.     Sensory: Sensation is intact.     Motor: Motor function is intact.  Psychiatric:        Mood and Affect: Mood normal.     (all labs ordered are listed, but only abnormal results are displayed) Labs Reviewed  COMPREHENSIVE METABOLIC PANEL WITH GFR - Abnormal; Notable for the following components:      Result Value   Glucose, Bld 108 (*)    BUN 21 (*)    Calcium 8.7 (*)    AST 54 (*)    Total Bilirubin 1.3 (*)    All other components within normal limits  CBC WITH DIFFERENTIAL/PLATELET - Abnormal; Notable for the following components:   WBC 21.9 (*)    RBC 1.89 (*)    Hemoglobin 7.2 (*)    HCT 19.6 (*)    MCV 103.7 (*)    MCH 38.1 (*)    MCHC 36.7 (*)    RDW 20.3 (*)    nRBC 3.7 (*)    Neutro Abs 14.6 (*)    Lymphs Abs 4.5 (*)    Monocytes Absolute 2.1 (*)    Abs Immature Granulocytes 0.17 (*)    All other components within normal limits  RETICULOCYTES - Abnormal; Notable for the following components:   Retic Ct Pct 8.2 (*)    RBC. 1.89 (*)    Immature Retic Fract 40.5 (*)    All  other components within normal limits    EKG: None  Radiology: No results found.    Procedures   Medications Ordered in the ED  sodium chloride  0.9 % bolus 1,000 mL (0 mLs Intravenous  Stopped 05/12/24 2352)  HYDROmorphone  (DILAUDID ) injection 1 mg (1 mg Intravenous Given 05/12/24 2251)  diphenhydrAMINE  (BENADRYL ) injection 25 mg (25 mg Intravenous Given 05/12/24 2251)  sodium chloride  0.9 % bolus 1,000 mL (0 mLs Intravenous Stopped 05/12/24 2352)  HYDROmorphone  (DILAUDID ) injection 1 mg (1 mg Intravenous Given 05/12/24 2319)  HYDROmorphone  (DILAUDID ) injection 2 mg (2 mg Intravenous Given 05/13/24 0022)  ondansetron  (ZOFRAN ) injection 4 mg (4 mg Intravenous Given 05/13/24 0022)                                    Medical Decision Making Amount and/or Complexity of Data Reviewed Labs: ordered. Radiology: ordered.  Risk Prescription drug management.    33 year old male with past medical history of sickle cell disease presenting for complaints of right leg pain.  Patient is alert and x 3, no acute distress, afebrile, stable to signs.  Leg is neurovascularly intact.  Minimal tenderness palpation of the quadriceps.  No wounds, gross deformities, skin rashes, or lesions.  Patient recommended for imaging and declined.  Medication given for pain control.  Patient signed out to oncoming provider while awaiting laboratory results and x-ray results     Final diagnoses:  Right leg pain  Sickle cell pain crisis [facility])    ED Discharge Orders          Ordered    US  Venous Img Lower Unilateral Right        05/13/24 0020               Elnor Bernarda SQUIBB, DO 06/05/24 2218  "

## 2024-05-12 NOTE — ED Provider Notes (Signed)
 [facility] Provider Note   CSN: [id number] Arrival date & time: 05/12/24  1943     Patient presents with: Sickle Cell Pain Crisis   Manuel Mcguire is a 33 y.o. male.   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  Patient admits to right lower extremity pain in his quadriceps.  Denies any recent falls or injuries.  Denies any leg swelling, previous DVTs or PEs.  Denies any skin color changes or rashes.  States his sickle cell pain is normally in his lower back.  The history is provided by the patient. No language interpreter was used.  Sickle Cell Pain Crisis Associated symptoms: no chest pain, no cough, no fever, no shortness of breath, no sore throat and no vomiting        Prior to Admission medications   Medication Sig Start Date End Date Taking? Authorizing Provider  amitriptyline (ELAVIL) 25 MG tablet Take 25 mg by mouth at bedtime.    [provider]  CALCIUM PO Take 1 tablet by mouth daily.    [provider]  cholecalciferol  (VITAMIN D3) 25 MCG (1000 UNIT) tablet Take 1,000 Units by mouth daily.    [provider]  folic acid  (FOLVITE ) 1 MG tablet Take 1 tablet (1 mg total) by mouth daily. 10/27/12   Alvia Rosaline LABOR, MD  hydroxyurea  (HYDREA ) 500 MG capsule Take 3 capsules (1,500 mg total) by mouth daily. May take with food to minimize GI side effects. 10/27/12   Alvia Rosaline LABOR, MD  lisinopril  (ZESTRIL ) 2.5 MG tablet Take 2.5 mg by mouth daily.    [provider]  metoCLOPramide  (REGLAN ) 10 MG tablet Take 1 tablet (10 mg total) by mouth 3 (three) times daily with meals. Patient taking differently: Take 10 mg by mouth 3 (three) times daily as needed for nausea or vomiting (take with food). 12/02/23 12/01/24  Ijaola, Onyeje M, NP  ondansetron  (ZOFRAN -ODT) 4 MG disintegrating tablet Take 1 tablet (4 mg total) by mouth every 8 (eight) hours  as needed for nausea or vomiting. 04/25/24   Geiple, Joshua, PA-C  oxyCODONE  (OXY IR/ROXICODONE ) 5 MG immediate release tablet Take 1 tablet (5 mg total) by mouth every 6 (six) hours as needed for severe pain (pain score 7-10). Patient taking differently: Take 5-10 mg by mouth every 4 (four) hours as needed for moderate pain (pain score 4-6) or severe pain (pain score 7-10). 12/02/23   Ijaola, Onyeje M, NP  pantoprazole (PROTONIX) 40 MG tablet Take 40 mg by mouth daily before breakfast.    [provider]  promethazine  (PHENERGAN ) 12.5 MG tablet Take 12.5 mg by mouth every 8 (eight) hours as needed for nausea or vomiting. 12/27/23   [provider]  pseudoephedrine (SUDAFED) 120 MG 12 hr tablet Take 120 mg by mouth every 12 (twelve) hours as needed for congestion.    [provider]    Allergies: Patient has no known allergies.    Review of Systems  Constitutional:  Negative for chills and fever.  HENT:  Negative for ear pain and sore throat.   Eyes:  Negative for pain and visual disturbance.  Respiratory:  Negative for cough and shortness of breath.   Cardiovascular:  Negative for chest pain and palpitations.  Gastrointestinal:  Negative for abdominal pain and vomiting.  Genitourinary:  Negative for dysuria and hematuria.  Musculoskeletal:  Negative for arthralgias and back pain.  Skin:  Negative for color change and rash.  Neurological:  Negative for seizures and syncope.  All other systems reviewed and are negative.   Updated Vital Signs BP 135/86   Pulse 87   Temp 98.4 F (36.9 C)   Resp 18   Ht 5' 7 (1.702 m)   Wt 77.1 kg   SpO2 97%   BMI 26.62 kg/m   Physical Exam Vitals and nursing note reviewed.  Constitutional:      General: He is not in acute distress.    Appearance: He is well-developed.  HENT:     Head: Normocephalic and atraumatic.  Eyes:     Conjunctiva/sclera: Conjunctivae normal.  Cardiovascular:     Rate and Rhythm: Normal rate  and regular rhythm.     Pulses:          Dorsalis pedis pulses are 2+ on the right side and 2+ on the left side.     Heart sounds: No murmur heard. Pulmonary:     Effort: Pulmonary effort is normal. No respiratory distress.     Breath sounds: Normal breath sounds.  Abdominal:     Palpations: Abdomen is soft.     Tenderness: There is no abdominal tenderness.  Musculoskeletal:        General: No swelling.     Cervical back: Neck supple.     Right lower leg: No edema.     Left lower leg: No edema.       Legs:  Skin:    General: Skin is warm and dry.     Capillary Refill: Capillary refill takes less than 2 seconds.  Neurological:     Mental Status: He is alert.     Sensory: Sensation is intact.     Motor: Motor function is intact.  Psychiatric:        Mood and Affect: Mood normal.     (all labs ordered are listed, but only abnormal results are displayed) Labs Reviewed  COMPREHENSIVE METABOLIC PANEL WITH GFR - Abnormal; Notable for the following components:      Result Value   Glucose, Bld 108 (*)    BUN 21 (*)    Calcium 8.7 (*)    AST 54 (*)    Total Bilirubin 1.3 (*)    All other components within normal limits  CBC WITH DIFFERENTIAL/PLATELET - Abnormal; Notable for the following components:   WBC 21.9 (*)    RBC 1.89 (*)    Hemoglobin 7.2 (*)    HCT 19.6 (*)    MCV 103.7 (*)    MCH 38.1 (*)    MCHC 36.7 (*)    RDW 20.3 (*)    nRBC 3.7 (*)    Neutro Abs 14.6 (*)    Lymphs Abs 4.5 (*)    Monocytes Absolute 2.1 (*)    Abs Immature Granulocytes 0.17 (*)    All other components within normal limits  RETICULOCYTES - Abnormal; Notable for the following components:   Retic Ct Pct 8.2 (*)    RBC. 1.89 (*)    Immature Retic Fract 40.5 (*)    All other components within normal limits    EKG: None  Radiology: DG Chest Port 1 View Result Date: 05/12/2024 CLINICAL DATA:  Sickle cell crisis EXAM: PORTABLE CHEST 1 VIEW COMPARISON:  04/27/2024 FINDINGS: Single frontal  view of the chest demonstrates an unremarkable cardiac silhouette. No acute airspace disease, effusion, or pneumothorax. Stable postsurgical changes from thoracic spinal fusion. No acute bony abnormalities. IMPRESSION: 1.  No acute intrathoracic process. Electronically Signed   By: Ozell Daring M.D.   On: 05/12/2024 21:54     Procedures   Medications Ordered in the ED  HYDROmorphone  (DILAUDID ) injection 1 mg (0 mg Subcutaneous Hold 05/12/24 2122)  sodium chloride  0.9 % bolus 1,000 mL (1,000 mLs Intravenous New Bag/Given 05/12/24 2251)  HYDROmorphone  (DILAUDID ) injection 1 mg (1 mg Intravenous Given 05/12/24 2251)  diphenhydrAMINE  (BENADRYL ) injection 25 mg (25 mg Intravenous Given 05/12/24 2251)  sodium chloride  0.9 % bolus 1,000 mL (1,000 mLs Intravenous New Bag/Given 05/12/24 2250)  HYDROmorphone  (DILAUDID ) injection 1 mg (1 mg Intravenous Given 05/12/24 2319)                                    Medical Decision Making Amount and/or Complexity of Data Reviewed Labs: ordered. Radiology: ordered.  Risk Prescription drug management.   33 year old male with sickle cell disease presenting for right quadriceps pain.  He is alert and oriented x 3, no acute distress, afebrile, simple signs.  Physical exam demonstrates no rashes, ecchymosis, swelling, wounds, skin color changes, or signs of trauma.  He has tenderness to palpation on exam.  He is recommended for an ultrasound of the lower extremity but ultrasound is not here tonight.  He was recommended for an ultrasound in the morning but declines.  He has leukocytosis of 21 and his typical white blood cell count is 12.6.  Hemoglobin is stable at 7.2.  Reticulocyte count is stable.  IV fluids, pain meds given.  Patient signed out to oncoming physician while awaiting pain medication and reevaluation.     Final diagnoses:  Right leg pain    ED Discharge Orders     None          Elnor Bernarda SQUIBB, DO 05/12/24 2337

## 2024-05-12 NOTE — ED Notes (Signed)
 Pt states he needs 2mg  of dilaudid , 3 times. He states he will not have any pain relief from 1mg  of dilaudid  1 time

## 2024-05-12 NOTE — ED Notes (Signed)
 Pt is falling asleep in the bed and nodding off, pt proceeds to state that he will not accept IM or subcut dilaudid  because he is in too much pain.

## 2024-05-12 NOTE — ED Triage Notes (Signed)
 Pt states sickle pain crisis x 2 days  Pain worse in lower back  Also reports N/V

## 2024-05-13 ENCOUNTER — Encounter (HOSPITAL_BASED_OUTPATIENT_CLINIC_OR_DEPARTMENT_OTHER): Payer: Self-pay | Admitting: Emergency Medicine

## 2024-05-13 ENCOUNTER — Other Ambulatory Visit: Payer: Self-pay

## 2024-05-13 ENCOUNTER — Emergency Department (HOSPITAL_BASED_OUTPATIENT_CLINIC_OR_DEPARTMENT_OTHER)
Admission: EM | Admit: 2024-05-13 | Discharge: 2024-05-14 | Disposition: A | Source: Home / Self Care | Attending: Emergency Medicine | Admitting: Emergency Medicine

## 2024-05-13 ENCOUNTER — Emergency Department (HOSPITAL_BASED_OUTPATIENT_CLINIC_OR_DEPARTMENT_OTHER)

## 2024-05-13 DIAGNOSIS — R1115 Cyclical vomiting syndrome unrelated to migraine: Secondary | ICD-10-CM

## 2024-05-13 DIAGNOSIS — R000 Tachycardia, unspecified: Secondary | ICD-10-CM | POA: Diagnosis not present

## 2024-05-13 DIAGNOSIS — D649 Anemia, unspecified: Secondary | ICD-10-CM | POA: Diagnosis not present

## 2024-05-13 DIAGNOSIS — E2740 Unspecified adrenocortical insufficiency: Secondary | ICD-10-CM | POA: Diagnosis not present

## 2024-05-13 DIAGNOSIS — D5700 Hb-SS disease with crisis, unspecified: Secondary | ICD-10-CM

## 2024-05-13 LAB — CBC WITH DIFFERENTIAL/PLATELET
Abs Immature Granulocytes: 0.19 K/uL — ABNORMAL HIGH (ref 0.00–0.07)
Basophils Absolute: 0.1 K/uL (ref 0.0–0.1)
Basophils Relative: 0 %
Eosinophils Absolute: 0.1 K/uL (ref 0.0–0.5)
Eosinophils Relative: 1 %
HCT: 22.6 % — ABNORMAL LOW (ref 39.0–52.0)
Hemoglobin: 8.3 g/dL — ABNORMAL LOW (ref 13.0–17.0)
Immature Granulocytes: 1 %
Lymphocytes Relative: 8 %
Lymphs Abs: 1.8 K/uL (ref 0.7–4.0)
MCH: 38.1 pg — ABNORMAL HIGH (ref 26.0–34.0)
MCHC: 36.7 g/dL — ABNORMAL HIGH (ref 30.0–36.0)
MCV: 103.7 fL — ABNORMAL HIGH (ref 80.0–100.0)
Monocytes Absolute: 1.7 K/uL — ABNORMAL HIGH (ref 0.1–1.0)
Monocytes Relative: 7 %
Neutro Abs: 20.2 K/uL — ABNORMAL HIGH (ref 1.7–7.7)
Neutrophils Relative %: 83 %
Platelets: 558 K/uL — ABNORMAL HIGH (ref 150–400)
RBC: 2.18 MIL/uL — ABNORMAL LOW (ref 4.22–5.81)
RDW: 21.6 % — ABNORMAL HIGH (ref 11.5–15.5)
WBC: 24 K/uL — ABNORMAL HIGH (ref 4.0–10.5)
nRBC: 6.2 % — ABNORMAL HIGH (ref 0.0–0.2)

## 2024-05-13 LAB — COMPREHENSIVE METABOLIC PANEL WITH GFR
ALT: 30 U/L (ref 0–44)
AST: 69 U/L — ABNORMAL HIGH (ref 15–41)
Albumin: 4 g/dL (ref 3.5–5.0)
Alkaline Phosphatase: 107 U/L (ref 38–126)
Anion gap: 11 (ref 5–15)
BUN: 11 mg/dL (ref 6–20)
CO2: 19 mmol/L — ABNORMAL LOW (ref 22–32)
Calcium: 9 mg/dL (ref 8.9–10.3)
Chloride: 110 mmol/L (ref 98–111)
Creatinine, Ser: 0.79 mg/dL (ref 0.61–1.24)
GFR, Estimated: >60 mL/min (ref >60)
Glucose, Bld: 130 mg/dL — ABNORMAL HIGH (ref 70–99)
Potassium: 4.8 mmol/L (ref 3.5–5.1)
Sodium: 140 mmol/L (ref 135–145)
Total Bilirubin: 1.5 mg/dL — ABNORMAL HIGH (ref 0.0–1.2)
Total Protein: 7.8 g/dL (ref 6.5–8.1)

## 2024-05-13 LAB — RETICULOCYTES
Immature Retic Fract: 32.9 % — ABNORMAL HIGH (ref 2.3–15.9)
RBC.: 2.21 MIL/uL — ABNORMAL LOW (ref 4.22–5.81)
Retic Count, Absolute: 169.9 K/uL (ref 19.0–186.0)
Retic Ct Pct: 7.7 % — ABNORMAL HIGH (ref 0.4–3.1)

## 2024-05-13 MED ORDER — KETOROLAC TROMETHAMINE 15 MG/ML IJ SOLN
15.0000 mg | INTRAMUSCULAR | Status: AC
  Administered 2024-05-13: 15 mg via INTRAVENOUS
  Filled 2024-05-13: qty 1

## 2024-05-13 MED ORDER — HYDROCORTISONE SOD SUC (PF) 100 MG IJ SOLR
100.0000 mg | Freq: Once | INTRAMUSCULAR | Status: AC
  Administered 2024-05-13: 100 mg via INTRAVENOUS
  Filled 2024-05-13: qty 2

## 2024-05-13 MED ORDER — HYDROMORPHONE HCL 1 MG/ML IJ SOLN
2.0000 mg | INTRAMUSCULAR | Status: AC
  Administered 2024-05-13: 2 mg via INTRAVENOUS
  Filled 2024-05-13: qty 2

## 2024-05-13 MED ORDER — HYDROMORPHONE HCL 1 MG/ML IJ SOLN
2.0000 mg | INTRAMUSCULAR | Status: AC
  Administered 2024-05-14: 2 mg via INTRAVENOUS
  Filled 2024-05-13: qty 2

## 2024-05-13 MED ORDER — HYDROMORPHONE HCL 1 MG/ML IJ SOLN
2.0000 mg | Freq: Once | INTRAMUSCULAR | Status: AC
  Administered 2024-05-13: 2 mg via INTRAVENOUS
  Filled 2024-05-13: qty 2

## 2024-05-13 MED ORDER — ONDANSETRON HCL 4 MG/2ML IJ SOLN
4.0000 mg | INTRAMUSCULAR | Status: DC | PRN
  Administered 2024-05-13: 4 mg via INTRAVENOUS
  Filled 2024-05-13 (×2): qty 2

## 2024-05-13 MED ORDER — DIPHENHYDRAMINE HCL 25 MG PO CAPS
25.0000 mg | ORAL_CAPSULE | ORAL | Status: DC | PRN
  Administered 2024-05-14: 25 mg via ORAL
  Filled 2024-05-13: qty 1

## 2024-05-13 MED ORDER — DEXTROSE-SODIUM CHLORIDE 5-0.45 % IV SOLN: INTRAVENOUS | Status: DC

## 2024-05-13 MED ORDER — ONDANSETRON HCL 4 MG/2ML IJ SOLN
4.0000 mg | Freq: Once | INTRAMUSCULAR | Status: AC
  Administered 2024-05-13: 4 mg via INTRAVENOUS
  Filled 2024-05-13: qty 2

## 2024-05-13 NOTE — ED Provider Notes (Signed)
 I assumed care at signout to reassess patient.  Plan was to recheck pain level as well as follow-up on her right femur x-ray.  Patient refused x-ray of the right leg. He reports overall his leg pain is improved.  Patient still demanding more Dilaudid . He is awake and alert in no acute distress.  Lower extremities reveal distal equal pulses.  No deformities.  No crepitance, no erythema or swelling & no focal tenderness  He does agree to return for next day vascular ultrasound the right leg Otherwise patient refusing further workup, he will be discharged.  Unclear cause of leukocytosis, but patient is afebrile and is in no acute distress listening to music and watching television   Midge Golas, MD 05/13/24 [phone]

## 2024-05-13 NOTE — ED Triage Notes (Signed)
 Pt returns for continued pain to lower back that he attributes to Sickle Cell Crisis; also reports continued NV

## 2024-05-13 NOTE — ED Notes (Signed)
 Pt requesting more dilaudid . Dr. Midge notified

## 2024-05-14 ENCOUNTER — Other Ambulatory Visit: Payer: Self-pay

## 2024-05-14 ENCOUNTER — Emergency Department (HOSPITAL_BASED_OUTPATIENT_CLINIC_OR_DEPARTMENT_OTHER)

## 2024-05-14 ENCOUNTER — Emergency Department (HOSPITAL_BASED_OUTPATIENT_CLINIC_OR_DEPARTMENT_OTHER)
Admission: EM | Admit: 2024-05-14 | Discharge: 2024-05-15 | Disposition: A | Attending: Emergency Medicine | Admitting: Emergency Medicine

## 2024-05-14 DIAGNOSIS — D57219 Sickle-cell/Hb-C disease with crisis, unspecified: Secondary | ICD-10-CM | POA: Diagnosis not present

## 2024-05-14 DIAGNOSIS — D5700 Hb-SS disease with crisis, unspecified: Secondary | ICD-10-CM

## 2024-05-14 DIAGNOSIS — R1115 Cyclical vomiting syndrome unrelated to migraine: Secondary | ICD-10-CM

## 2024-05-14 DIAGNOSIS — R109 Unspecified abdominal pain: Secondary | ICD-10-CM | POA: Diagnosis not present

## 2024-05-14 LAB — URINALYSIS, W/ REFLEX TO CULTURE (INFECTION SUSPECTED)
Bilirubin Urine: NEGATIVE
Glucose, UA: NEGATIVE mg/dL
Ketones, ur: NEGATIVE mg/dL
Leukocytes,Ua: NEGATIVE
Nitrite: NEGATIVE
Protein, ur: >=300 mg/dL — AB
Specific Gravity, Urine: 1.015 (ref 1.005–1.030)
pH: 5.5 (ref 5.0–8.0)

## 2024-05-14 LAB — CBC WITH DIFFERENTIAL/PLATELET
Abs Immature Granulocytes: 0.31 K/uL — ABNORMAL HIGH (ref 0.00–0.07)
Basophils Absolute: 0 K/uL (ref 0.0–0.1)
Basophils Relative: 0 %
Eosinophils Absolute: 0 K/uL (ref 0.0–0.5)
Eosinophils Relative: 0 %
HCT: 23.3 % — ABNORMAL LOW (ref 39.0–52.0)
Hemoglobin: 8.7 g/dL — ABNORMAL LOW (ref 13.0–17.0)
Immature Granulocytes: 1 %
Lymphocytes Relative: 12 %
Lymphs Abs: 2.7 K/uL (ref 0.7–4.0)
MCH: 38.2 pg — ABNORMAL HIGH (ref 26.0–34.0)
MCHC: 37.3 g/dL — ABNORMAL HIGH (ref 30.0–36.0)
MCV: 102.2 fL — ABNORMAL HIGH (ref 80.0–100.0)
Monocytes Absolute: 2.2 K/uL — ABNORMAL HIGH (ref 0.1–1.0)
Monocytes Relative: 10 %
Neutro Abs: 17.6 K/uL — ABNORMAL HIGH (ref 1.7–7.7)
Neutrophils Relative %: 77 %
Platelets: 626 K/uL — ABNORMAL HIGH (ref 150–400)
RBC: 2.28 MIL/uL — ABNORMAL LOW (ref 4.22–5.81)
RDW: 21.2 % — ABNORMAL HIGH (ref 11.5–15.5)
WBC: 22.9 K/uL — ABNORMAL HIGH (ref 4.0–10.5)
nRBC: 9.4 % — ABNORMAL HIGH (ref 0.0–0.2)

## 2024-05-14 LAB — COMPREHENSIVE METABOLIC PANEL WITH GFR
ALT: 27 U/L (ref 0–44)
AST: 48 U/L — ABNORMAL HIGH (ref 15–41)
Albumin: 4.3 g/dL (ref 3.5–5.0)
Alkaline Phosphatase: 110 U/L (ref 38–126)
Anion gap: 12 (ref 5–15)
BUN: 10 mg/dL (ref 6–20)
CO2: 22 mmol/L (ref 22–32)
Calcium: 9.6 mg/dL (ref 8.9–10.3)
Chloride: 106 mmol/L (ref 98–111)
Creatinine, Ser: 0.94 mg/dL (ref 0.61–1.24)
GFR, Estimated: >60 mL/min (ref >60)
Glucose, Bld: 134 mg/dL — ABNORMAL HIGH (ref 70–99)
Potassium: 3.6 mmol/L (ref 3.5–5.1)
Sodium: 140 mmol/L (ref 135–145)
Total Bilirubin: 1.7 mg/dL — ABNORMAL HIGH (ref 0.0–1.2)
Total Protein: 8.4 g/dL — ABNORMAL HIGH (ref 6.5–8.1)

## 2024-05-14 LAB — RETICULOCYTES
Immature Retic Fract: 40.1 % — ABNORMAL HIGH (ref 2.3–15.9)
RBC.: 2.33 MIL/uL — ABNORMAL LOW (ref 4.22–5.81)
Retic Count, Absolute: 230.2 K/uL — ABNORMAL HIGH (ref 19.0–186.0)
Retic Ct Pct: 9.9 % — ABNORMAL HIGH (ref 0.4–3.1)

## 2024-05-14 MED ORDER — METOCLOPRAMIDE HCL 5 MG/ML IJ SOLN
10.0000 mg | Freq: Once | INTRAMUSCULAR | Status: AC
  Administered 2024-05-14: 10 mg via INTRAVENOUS
  Filled 2024-05-14: qty 2

## 2024-05-14 MED ORDER — HYDROMORPHONE HCL 1 MG/ML IJ SOLN
2.0000 mg | Freq: Once | INTRAMUSCULAR | Status: AC
  Administered 2024-05-15: 2 mg via INTRAVENOUS
  Filled 2024-05-14: qty 2

## 2024-05-14 MED ORDER — HYDROMORPHONE HCL 1 MG/ML IJ SOLN: 1.0000 mg | Freq: Once | INTRAMUSCULAR | Status: DC

## 2024-05-14 MED ORDER — HYDROMORPHONE HCL 1 MG/ML IJ SOLN
1.0000 mg | Freq: Once | INTRAMUSCULAR | Status: AC
  Administered 2024-05-14: 1 mg via INTRAVENOUS
  Filled 2024-05-14: qty 1

## 2024-05-14 MED ORDER — HYDROCORTISONE SOD SUC (PF) 100 MG IJ SOLR
100.0000 mg | Freq: Once | INTRAMUSCULAR | Status: AC
  Administered 2024-05-14: 100 mg via INTRAVENOUS
  Filled 2024-05-14: qty 2

## 2024-05-14 MED ORDER — HYDROMORPHONE HCL 1 MG/ML IJ SOLN
2.0000 mg | Freq: Once | INTRAMUSCULAR | Status: AC
  Administered 2024-05-14: 2 mg via INTRAVENOUS
  Filled 2024-05-14: qty 2

## 2024-05-14 MED ORDER — IOHEXOL 300 MG/ML  SOLN
80.0000 mL | Freq: Once | INTRAMUSCULAR | Status: AC | PRN
  Administered 2024-05-14: 80 mL via INTRAVENOUS

## 2024-05-14 MED ORDER — SODIUM CHLORIDE 0.9 % IV BOLUS
1000.0000 mL | Freq: Once | INTRAVENOUS | Status: AC
  Administered 2024-05-14: 1000 mL via INTRAVENOUS

## 2024-05-14 MED ORDER — ONDANSETRON HCL 4 MG/2ML IJ SOLN
4.0000 mg | Freq: Once | INTRAMUSCULAR | Status: AC
  Administered 2024-05-14: 4 mg via INTRAVENOUS
  Filled 2024-05-14: qty 2

## 2024-05-14 MED ORDER — PROMETHAZINE HCL 25 MG RE SUPP: 25.0000 mg | Freq: Four times a day (QID) | RECTAL | 0 refills | Status: AC | PRN

## 2024-05-14 NOTE — ED Notes (Signed)
 Pt states he did not feel medication that was administered by nurse. Checked IV with US , IV in vein however removed per pt request.

## 2024-05-14 NOTE — ED Provider Notes (Signed)
 [place] [facility] Provider Note   CSN: [id number] Arrival date & time: 05/14/24  2109     Patient presents with: Sickle Cell Pain Crisis   Manuel Mcguire is a 33 y.o. male.  {Add pertinent medical, surgical, social history, OB history to YEP:67052} Patient is a 33 year old male with history of sickle cell disease and cyclic vomiting.  Patient presenting with complaints of body aches, vomiting, generalized abdominal pain.  These are the same symptoms he experiences with flareups of the above.  No fevers or chills, no diarrhea or constipation.  Patient was here last night with similar presentation.  He improved and was discharged, but now symptoms have returned.  Unable to take home medications due to vomiting.       Prior to Admission medications   Medication Sig Start Date End Date Taking? Authorizing Provider  amitriptyline (ELAVIL) 25 MG tablet Take 25 mg by mouth at bedtime.    [provider]  CALCIUM PO Take 1 tablet by mouth daily.    [provider]  cholecalciferol  (VITAMIN D3) 25 MCG (1000 UNIT) tablet Take 1,000 Units by mouth daily.    [provider]  folic acid  (FOLVITE ) 1 MG tablet Take 1 tablet (1 mg total) by mouth daily. 10/27/12   Alvia Rosaline LABOR, MD  hydroxyurea  (HYDREA ) 500 MG capsule Take 3 capsules (1,500 mg total) by mouth daily. May take with food to minimize GI side effects. 10/27/12   Alvia Rosaline LABOR, MD  lisinopril  (ZESTRIL ) 2.5 MG tablet Take 2.5 mg by mouth daily.    [provider]  metoCLOPramide  (REGLAN ) 10 MG tablet Take 1 tablet (10 mg total) by mouth 3 (three) times daily with meals. Patient taking differently: Take 10 mg by mouth 3 (three) times daily as needed for nausea or vomiting (take with food). 12/02/23 12/01/24  Ijaola, Onyeje M, NP  ondansetron  (ZOFRAN -ODT) 4 MG disintegrating tablet Take 1 tablet (4 mg total) by mouth every 8 (eight) hours as needed for  nausea or vomiting. 04/25/24   Geiple, Joshua, PA-C  oxyCODONE  (OXY IR/ROXICODONE ) 5 MG immediate release tablet Take 1 tablet (5 mg total) by mouth every 6 (six) hours as needed for severe pain (pain score 7-10). Patient taking differently: Take 5-10 mg by mouth every 4 (four) hours as needed for moderate pain (pain score 4-6) or severe pain (pain score 7-10). 12/02/23   Ijaola, Onyeje M, NP  pantoprazole (PROTONIX) 40 MG tablet Take 40 mg by mouth daily before breakfast.    [provider]  promethazine  (PHENERGAN ) 25 MG suppository Place 1 suppository (25 mg total) rectally every 6 (six) hours as needed for nausea or vomiting. 05/14/24   Trine Raynell Moder, MD  pseudoephedrine (SUDAFED) 120 MG 12 hr tablet Take 120 mg by mouth every 12 (twelve) hours as needed for congestion.    [provider]    Allergies: Patient has no known allergies.    Review of Systems  All other systems reviewed and are negative.   Updated Vital Signs BP (!) 152/95 (BP Location: Left Arm)   Pulse (!) 108   Temp 99.3 F (37.4 C)   Resp 20   SpO2 93%   Physical Exam Vitals and nursing note reviewed.  Constitutional:      General: He is not in acute distress.    Appearance: He is well-developed. He is not diaphoretic.  HENT:     Head: Normocephalic and atraumatic.  Cardiovascular:  Rate and Rhythm: Normal rate and regular rhythm.     Heart sounds: No murmur heard.    No friction rub.  Pulmonary:     Effort: Pulmonary effort is normal. No respiratory distress.     Breath sounds: Normal breath sounds. No wheezing or rales.  Abdominal:     General: Bowel sounds are normal. There is no distension.     Palpations: Abdomen is soft.     Tenderness: There is no abdominal tenderness.  Musculoskeletal:        General: Normal range of motion.     Cervical back: Normal range of motion and neck supple.  Skin:    General: Skin is warm and dry.  Neurological:     Mental Status: He is  alert and oriented to person, place, and time.     Coordination: Coordination normal.     (all labs ordered are listed, but only abnormal results are displayed) Labs Reviewed  COMPREHENSIVE METABOLIC PANEL WITH GFR - Abnormal; Notable for the following components:      Result Value   Glucose, Bld 134 (*)    Total Protein 8.4 (*)    AST 48 (*)    Total Bilirubin 1.7 (*)    All other components within normal limits  CBC WITH DIFFERENTIAL/PLATELET - Abnormal; Notable for the following components:   WBC 22.9 (*)    RBC 2.28 (*)    Hemoglobin 8.7 (*)    HCT 23.3 (*)    MCV 102.2 (*)    MCH 38.2 (*)    MCHC 37.3 (*)    RDW 21.2 (*)    Platelets 626 (*)    nRBC 9.4 (*)    Neutro Abs 17.6 (*)    Monocytes Absolute 2.2 (*)    Abs Immature Granulocytes 0.31 (*)    All other components within normal limits  RETICULOCYTES - Abnormal; Notable for the following components:   Retic Ct Pct 9.9 (*)    RBC. 2.33 (*)    Retic Count, Absolute 230.2 (*)    Immature Retic Fract 40.1 (*)    All other components within normal limits    EKG: None  Radiology: CT ABDOMEN PELVIS W CONTRAST Result Date: 05/14/2024 EXAM: CT ABDOMEN AND PELVIS WITH CONTRAST 05/14/2024 12:35:00 AM TECHNIQUE: CT of the abdomen and pelvis was performed with the administration of intravenous contrast. 80 mL of iohexol  (OMNIPAQUE ) 300 MG/ML solution was administered. Multiplanar reformatted images are provided for review. Automated exposure control, iterative reconstruction, and/or weight-based adjustment of the mA/kV was utilized to reduce the radiation dose to as low as reasonably achievable. COMPARISON: 01/14/2024 CLINICAL HISTORY: Abdominal pain, acute, nonlocalized. FINDINGS: LOWER CHEST: No acute abnormality. LIVER: The liver is unremarkable. GALLBLADDER AND BILE DUCTS: Prior cholecystectomy. No biliary ductal dilatation. SPLEEN: Autoinfarction of the spleen again noted. PANCREAS: No acute abnormality. ADRENAL GLANDS: No  acute abnormality. KIDNEYS, URETERS AND BLADDER: No stones in the kidneys or ureters. No hydronephrosis. No perinephric or periureteral stranding. Urinary bladder is unremarkable. GI AND BOWEL: Stomach demonstrates no acute abnormality. Moderate stool burden throughout the colon. Normal appendix. There is no bowel obstruction. PERITONEUM AND RETROPERITONEUM: No ascites. No free air. VASCULATURE: Aorta is normal in caliber. LYMPH NODES: No lymphadenopathy. REPRODUCTIVE ORGANS: No acute abnormality. BONES AND SOFT TISSUES: Postoperative changes in the left hip and thoracic spine. No acute bony abnormality. No focal soft tissue abnormality. IMPRESSION: 1. No acute findings in the abdomen or pelvis. 2. Moderate stool burden throughout the colon. Electronically  signed by: Franky Crease MD 05/14/2024 12:43 AM EST RP Workstation: [id number]    {Document cardiac monitor, telemetry assessment procedure when appropriate:32947} Procedures   Medications Ordered in the ED  HYDROmorphone  (DILAUDID ) injection 1 mg (1 mg Subcutaneous Patient Refused/Not Given 05/14/24 2121)  sodium chloride  0.9 % bolus 1,000 mL (has no administration in time range)  ondansetron  (ZOFRAN ) injection 4 mg (has no administration in time range)  HYDROmorphone  (DILAUDID ) injection 2 mg (has no administration in time range)  hydrocortisone  sodium succinate  (SOLU-CORTEF ) 100 MG injection 100 mg (has no administration in time range)      {Click here for ABCD2, HEART and other calculators REFRESH Note before signing:1}                              Medical Decision Making Amount and/or Complexity of Data Reviewed Labs: ordered.  Risk Prescription drug management.   ***  {Document critical care time when appropriate  Document review of labs and clinical decision tools ie CHADS2VASC2, etc  Document your independent review of radiology images and any outside records  Document your discussion with family members, caretakers and with  consultants  Document social determinants of health affecting pt's care  Document your decision making why or why not admission, treatments were needed:32947:::1}   Final diagnoses:  None    ED Discharge Orders     None

## 2024-05-14 NOTE — ED Provider Notes (Signed)
 [facility] Provider Note  CSN: [id number] Arrival date & time: 05/13/24 2151  Chief Complaint(s) Emesis and Sickle Cell Pain Crisis  HPI & MDM Manuel Mcguire is a 33 y.o. 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  Patient presents today for 14 hours of nausea and vomiting with decreased ability to tolerate p.o. not relieved with home Zofran .  He has not been able to take his steroids due to the p.o. intolerance.  He endorses abdominal pain similar to prior exacerbations of his cyclical vomiting.  Patient also endorses lower back pain consistent with sickle cell pain crisis.  Patient denies any alcohol use.  No illicit drug use.  His prior UDS's have been negative.  No suspicious food intake.  No known sick contacts.  No urinary symptoms.  Patient was seen here yesterday for the same and treated symptomatically.  His CBC was higher than previous baseline.  Hemoglobin was stable.  Felt better and tolerated p.o. was able to go home.   The history is provided by the patient.  Emesis Sickle Cell Pain Crisis Associated symptoms: vomiting     *** Clinical Course as of 05/14/24 0003  Sun May 13, 2024  2346 Abdominal pain with nausea and vomiting differential diagnosis considered and workup below.  The patient has a history of cyclical vomiting related to adrenal insufficiency.  Favoring exacerbation of this.  Will treat for sickle cell crisis and cyclical vomiting.  CBC does show worsening leukocytosis when compared to yesterday's labs.  2 weeks ago patient's WBCs were half the level while on Solu-Cortef .  Will order CT scan to rule out any serious intra-abdominal inflammatory/infectious process.  Patient's hemoglobin is at baseline not  consistent with hemolytic crisis. [PC]    Clinical Course User Index [PC] Johnmichael Melhorn, Raynell Moder, MD   Medical Decision Making Amount and/or Complexity of Data Reviewed Labs: ordered. Decision-making details documented in ED Course. Radiology: ordered and independent interpretation performed. Decision-making details documented in ED Course.  Risk Prescription drug management.    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 Authorizing Provider  amitriptyline (ELAVIL) 25 MG tablet Take 25 mg by mouth at bedtime.    [provider]  CALCIUM PO Take 1 tablet by mouth daily.    [provider]  cholecalciferol  (VITAMIN D3) 25 MCG (1000 UNIT) tablet Take 1,000 Units by mouth daily.    [provider]  folic acid  (FOLVITE ) 1  MG tablet Take 1 tablet (1 mg total) by mouth daily. 10/27/12   Alvia Rosaline LABOR, MD  hydroxyurea  (HYDREA ) 500 MG capsule Take 3 capsules (1,500 mg total) by mouth daily. May take with food to minimize GI side effects. 10/27/12   Alvia Rosaline LABOR, MD  lisinopril  (ZESTRIL ) 2.5 MG  tablet Take 2.5 mg by mouth daily.    [provider]  metoCLOPramide  (REGLAN ) 10 MG tablet Take 1 tablet (10 mg total) by mouth 3 (three) times daily with meals. Patient taking differently: Take 10 mg by mouth 3 (three) times daily as needed for nausea or vomiting (take with food). 12/02/23 12/01/24  Ijaola, Onyeje M, NP  ondansetron  (ZOFRAN -ODT) 4 MG disintegrating tablet Take 1 tablet (4 mg total) by mouth every 8 (eight) hours as needed for nausea or vomiting. 04/25/24   Desiderio Chew, PA-C  oxyCODONE  (OXY IR/ROXICODONE ) 5 MG immediate release tablet Take 1 tablet (5 mg total) by mouth every 6 (six) hours as needed for severe pain (pain score 7-10). Patient taking differently: Take 5-10 mg by mouth every 4 (four) hours as needed for moderate pain (pain score 4-6) or severe pain (pain score 7-10). 12/02/23   Ijaola, Onyeje M, NP  pantoprazole (PROTONIX) 40 MG tablet Take 40 mg by mouth daily before breakfast.    [provider]  promethazine  (PHENERGAN ) 12.5 MG tablet Take 12.5 mg by mouth every 8 (eight) hours as needed for nausea or vomiting. 12/27/23   [provider]  pseudoephedrine (SUDAFED) 120 MG 12 hr tablet Take 120 mg by mouth every 12 (twelve) hours as needed for congestion.    [provider]                                                                                                                                    Allergies Patient has no known allergies.  Review of Systems Review of Systems  Gastrointestinal:  Positive for vomiting.   As noted in HPI  Physical Exam Vital Signs  I have reviewed the triage vital signs BP (!) 144/108   Pulse (!) 101   Temp (!) 97.4 F (36.3 C)   Resp 20   Ht 5' 7 (1.702 m)   Wt 77.1 kg   SpO2 94%   BMI 26.62 kg/m  *** Physical Exam Vitals reviewed.  Constitutional:      General: He is not in acute distress.    Appearance: He is well-developed. He is not diaphoretic.  HENT:     Head:  Normocephalic and atraumatic.     Right Ear: External ear normal.     Left Ear: External ear normal.     Nose: Nose normal.     Mouth/Throat:     Mouth: Mucous membranes are moist.  Eyes:     General: No scleral icterus.    Conjunctiva/sclera: Conjunctivae normal.  Neck:     Trachea: Phonation normal.  Cardiovascular:     Rate and Rhythm: Regular rhythm. Tachycardia present.  Pulmonary:     Effort: Pulmonary effort is normal. No respiratory distress.     Breath sounds: No stridor.  Abdominal:     General: There is no distension.     Tenderness: There is generalized abdominal tenderness. There is no guarding or rebound.  Musculoskeletal:        General: Normal range of motion.     Cervical back: Normal range of motion.  Neurological:     Mental Status: He is alert and oriented to person, place, and time.  Psychiatric:        Behavior: Behavior normal.     ED Results and Treatments Labs (all labs ordered are listed, but only abnormal results are displayed) Labs Reviewed  CBC WITH DIFFERENTIAL/PLATELET - Abnormal; Notable for the following components:      Result Value   WBC 24.0 (*)    RBC 2.18 (*)    Hemoglobin 8.3 (*)    HCT 22.6 (*)    MCV 103.7 (*)    MCH 38.1 (*)    MCHC 36.7 (*)    RDW 21.6 (*)    Platelets 558 (*)    nRBC 6.2 (*)    Neutro Abs 20.2 (*)    Monocytes Absolute 1.7 (*)    Abs Immature Granulocytes 0.19 (*)    All other components within normal limits  RETICULOCYTES - Abnormal; Notable for the following components:   Retic Ct Pct 7.7 (*)    RBC. 2.21 (*)    Immature Retic Fract 32.9 (*)    All other components within normal limits  COMPREHENSIVE METABOLIC PANEL WITH GFR - Abnormal; Notable for the following components:   CO2 19 (*)    Glucose, Bld 130 (*)    AST 69 (*)    Total Bilirubin 1.5 (*)    All other components within normal limits  URINALYSIS, W/ REFLEX TO CULTURE (INFECTION SUSPECTED)                                                                                                                          EKG  EKG Interpretation Date/Time:    Ventricular Rate:    PR Interval:    QRS Duration:    QT Interval:    QTC Calculation:   R Axis:      Text Interpretation:         Radiology No results found.  Medications Ordered in ED Medications  dextrose  5 % and 0.45 % NaCl infusion (has no administration in time range)  HYDROmorphone  (DILAUDID ) injection 2 mg (has no administration in time range)  diphenhydrAMINE  (BENADRYL ) capsule 25-50 mg (has no administration in time range)  ondansetron  (ZOFRAN ) injection 4 mg (4 mg Intravenous Given 05/13/24 2357)  ketorolac  (TORADOL ) 15 MG/ML injection 15 mg (15 mg Intravenous Given 05/13/24 2357)  HYDROmorphone  (DILAUDID ) injection 2 mg (2 mg Intravenous Given 05/13/24 2358)  hydrocortisone  sodium succinate  (  SOLU-CORTEF ) 100 MG injection 100 mg (100 mg Intravenous Given 05/13/24 2357)   Procedures .1-3 Lead EKG Interpretation  Performed by: Trine Raynell Moder, MD Authorized by: Trine Raynell Moder, MD     Interpretation: normal     ECG rate:  102   ECG rate assessment: tachycardic     Rhythm: sinus rhythm     Ectopy: none     Conduction: normal     (including critical care time)   This chart was dictated using voice recognition software.  Despite best efforts to proofread,  errors can occur which can change the documentation meaning.

## 2024-05-14 NOTE — ED Triage Notes (Addendum)
 Pt c/o ongoing SCC, emesis. Seen yesterday for same.   Took home pain meds this AM, reports vomitting them back up. Denies fever.

## 2024-05-14 NOTE — ED Notes (Signed)
 Patient transported to CT

## 2024-05-15 MED ORDER — ONDANSETRON HCL 4 MG/2ML IJ SOLN
4.0000 mg | Freq: Once | INTRAMUSCULAR | Status: AC
  Administered 2024-05-15: 4 mg via INTRAVENOUS
  Filled 2024-05-15: qty 2

## 2024-05-15 MED ORDER — HYDROMORPHONE HCL 1 MG/ML IJ SOLN
2.0000 mg | Freq: Once | INTRAMUSCULAR | Status: AC
  Administered 2024-05-15: 2 mg via INTRAVENOUS
  Filled 2024-05-15: qty 2

## 2024-05-15 NOTE — Discharge Instructions (Signed)
Continue home medications as previously prescribed.  Follow-up with your primary doctor.

## 2024-05-16 ENCOUNTER — Other Ambulatory Visit: Payer: Self-pay

## 2024-05-16 ENCOUNTER — Encounter (HOSPITAL_BASED_OUTPATIENT_CLINIC_OR_DEPARTMENT_OTHER): Payer: Self-pay | Admitting: Emergency Medicine

## 2024-05-16 ENCOUNTER — Emergency Department (HOSPITAL_BASED_OUTPATIENT_CLINIC_OR_DEPARTMENT_OTHER)
Admission: EM | Admit: 2024-05-16 | Discharge: 2024-05-17 | Disposition: A | Discharge status: Home / Self Care | Attending: Emergency Medicine | Admitting: Emergency Medicine

## 2024-05-16 DIAGNOSIS — R112 Nausea with vomiting, unspecified: Secondary | ICD-10-CM | POA: Diagnosis not present

## 2024-05-16 DIAGNOSIS — D5700 Hb-SS disease with crisis, unspecified: Secondary | ICD-10-CM | POA: Diagnosis not present

## 2024-05-16 DIAGNOSIS — R1110 Vomiting, unspecified: Secondary | ICD-10-CM | POA: Diagnosis present

## 2024-05-16 DIAGNOSIS — I10 Essential (primary) hypertension: Secondary | ICD-10-CM | POA: Diagnosis not present

## 2024-05-16 LAB — COMPREHENSIVE METABOLIC PANEL WITH GFR
ALT: 23 U/L (ref 0–44)
AST: 51 U/L — ABNORMAL HIGH (ref 15–41)
Albumin: 4.2 g/dL (ref 3.5–5.0)
Alkaline Phosphatase: 98 U/L (ref 38–126)
Anion gap: 11 (ref 5–15)
BUN: 10 mg/dL (ref 6–20)
CO2: 25 mmol/L (ref 22–32)
Calcium: 9.3 mg/dL (ref 8.9–10.3)
Chloride: 104 mmol/L (ref 98–111)
Creatinine, Ser: 0.89 mg/dL (ref 0.61–1.24)
GFR, Estimated: >60 mL/min (ref >60)
Glucose, Bld: 108 mg/dL — ABNORMAL HIGH (ref 70–99)
Potassium: 3.8 mmol/L (ref 3.5–5.1)
Sodium: 139 mmol/L (ref 135–145)
Total Bilirubin: 1.3 mg/dL — ABNORMAL HIGH (ref 0.0–1.2)
Total Protein: 8.2 g/dL — ABNORMAL HIGH (ref 6.5–8.1)

## 2024-05-16 LAB — CBC WITH DIFFERENTIAL/PLATELET
Abs Immature Granulocytes: 0.27 K/uL — ABNORMAL HIGH (ref 0.00–0.07)
Basophils Absolute: 0.1 K/uL (ref 0.0–0.1)
Basophils Relative: 0 %
Eosinophils Absolute: 0 K/uL (ref 0.0–0.5)
Eosinophils Relative: 0 %
HCT: 24.2 % — ABNORMAL LOW (ref 39.0–52.0)
Hemoglobin: 8.7 g/dL — ABNORMAL LOW (ref 13.0–17.0)
Immature Granulocytes: 2 %
Lymphocytes Relative: 25 %
Lymphs Abs: 4 K/uL (ref 0.7–4.0)
MCH: 37.8 pg — ABNORMAL HIGH (ref 26.0–34.0)
MCHC: 36 g/dL (ref 30.0–36.0)
MCV: 105.2 fL — ABNORMAL HIGH (ref 80.0–100.0)
Monocytes Absolute: 2 K/uL — ABNORMAL HIGH (ref 0.1–1.0)
Monocytes Relative: 12 %
Neutro Abs: 9.9 K/uL — ABNORMAL HIGH (ref 1.7–7.7)
Neutrophils Relative %: 61 %
Platelets: 681 K/uL — ABNORMAL HIGH (ref 150–400)
RBC: 2.3 MIL/uL — ABNORMAL LOW (ref 4.22–5.81)
RDW: 19.6 % — ABNORMAL HIGH (ref 11.5–15.5)
Smear Review: NORMAL
WBC: 16.3 K/uL — ABNORMAL HIGH (ref 4.0–10.5)
nRBC: 10.3 % — ABNORMAL HIGH (ref 0.0–0.2)

## 2024-05-16 LAB — URINALYSIS, ROUTINE W REFLEX MICROSCOPIC
Bilirubin Urine: NEGATIVE
Glucose, UA: NEGATIVE mg/dL
Hgb urine dipstick: NEGATIVE
Ketones, ur: NEGATIVE mg/dL
Leukocytes,Ua: NEGATIVE
Nitrite: NEGATIVE
Protein, ur: 100 mg/dL — AB
Specific Gravity, Urine: 1.015 (ref 1.005–1.030)
pH: 5.5 (ref 5.0–8.0)

## 2024-05-16 LAB — RETIC PANEL
Immature Retic Fract: 39 % — ABNORMAL HIGH (ref 2.3–15.9)
RBC.: 2.29 MIL/uL — ABNORMAL LOW (ref 4.22–5.81)
Retic Count, Absolute: 240.9 K/uL — ABNORMAL HIGH (ref 19.0–186.0)
Retic Ct Pct: 10.5 % — ABNORMAL HIGH (ref 0.4–3.1)
Reticulocyte Hemoglobin: 35.4 pg (ref >27.9)

## 2024-05-16 LAB — URINALYSIS, MICROSCOPIC (REFLEX)

## 2024-05-16 LAB — LIPASE, BLOOD: Lipase: 25 U/L (ref 11–51)

## 2024-05-16 MED ORDER — ONDANSETRON HCL 4 MG/2ML IJ SOLN
4.0000 mg | Freq: Once | INTRAMUSCULAR | Status: AC
  Administered 2024-05-16: 4 mg via INTRAVENOUS
  Filled 2024-05-16: qty 2

## 2024-05-16 MED ORDER — PROMETHAZINE HCL 25 MG RE SUPP: 25.0000 mg | Freq: Four times a day (QID) | RECTAL | 0 refills | Status: DC | PRN

## 2024-05-16 MED ORDER — ONDANSETRON 4 MG PO TBDP: 4.0000 mg | ORAL_TABLET | Freq: Once | ORAL | Status: DC

## 2024-05-16 MED ORDER — HYDROMORPHONE HCL 1 MG/ML IJ SOLN
1.0000 mg | Freq: Once | INTRAMUSCULAR | Status: AC
  Administered 2024-05-16: 1 mg via INTRAVENOUS
  Filled 2024-05-16: qty 1

## 2024-05-16 MED ORDER — OXYCODONE HCL 5 MG PO TABS
10.0000 mg | ORAL_TABLET | Freq: Once | ORAL | Status: AC
  Administered 2024-05-17: 10 mg via ORAL
  Filled 2024-05-16: qty 2

## 2024-05-16 NOTE — ED Provider Notes (Addendum)
 [facility] Provider Note   CSN: [id number] Arrival date & time: 05/16/24  1950     Patient presents with: Sickle Cell Pain Crisis and Emesis   Manuel Mcguire is a 33 y.o. male.  Presents today for sickle cell pain and emesis x 2 days.  Patient was seen for similar 2 days ago. Patient reports back pain and being unable to tolerate PO pain medications at home.  Patient denies chest pain, fever, or shortness of breath.    Sickle Cell Pain Crisis Associated symptoms: nausea and vomiting   Emesis Associated symptoms: arthralgias and myalgias        Prior to Admission medications   Medication Sig Start Date End Date Taking? Authorizing Provider  promethazine  (PHENERGAN ) 25 MG suppository Place 1 suppository (25 mg total) rectally every 6 (six) hours as needed for nausea or vomiting. 05/16/24  Yes Francis Ileana SAILOR, PA-C  amitriptyline (ELAVIL) 25 MG tablet Take 25 mg by mouth at bedtime.    [provider]  CALCIUM PO Take 1 tablet by mouth daily.    [provider]  cholecalciferol  (VITAMIN D3) 25 MCG (1000 UNIT) tablet Take 1,000 Units by mouth daily.    [provider]  folic acid  (FOLVITE ) 1 MG tablet Take 1 tablet (1 mg total) by mouth daily. 10/27/12   Alvia Rosaline LABOR, MD  hydroxyurea  (HYDREA ) 500 MG capsule Take 3 capsules (1,500 mg total) by mouth daily. May take with food to minimize GI side effects. 10/27/12   Alvia Rosaline LABOR, MD  lisinopril  (ZESTRIL ) 2.5 MG tablet Take 2.5 mg by mouth daily.    [provider]  metoCLOPramide  (REGLAN ) 10 MG tablet Take 1 tablet (10 mg total) by mouth 3 (three) times daily with meals. Patient taking differently: Take 10 mg by mouth 3 (three) times daily as needed for nausea or vomiting (take with food). 12/02/23 12/01/24  Ijaola, Onyeje M, NP  ondansetron  (ZOFRAN -ODT) 4 MG disintegrating tablet Take 1 tablet (4 mg total) by mouth every 8 (eight) hours as  needed for nausea or vomiting. 04/25/24   Desiderio Chew, PA-C  oxyCODONE  (OXY IR/ROXICODONE ) 5 MG immediate release tablet Take 1 tablet (5 mg total) by mouth every 6 (six) hours as needed for severe pain (pain score 7-10). Patient taking differently: Take 5-10 mg by mouth every 4 (four) hours as needed for moderate pain (pain score 4-6) or severe pain (pain score 7-10). 12/02/23   Ijaola, Onyeje M, NP  pantoprazole (PROTONIX) 40 MG tablet Take 40 mg by mouth daily before breakfast.    [provider]  promethazine  (PHENERGAN ) 25 MG suppository Place 1 suppository (25 mg total) rectally every 6 (six) hours as needed for nausea or vomiting. 05/14/24   Trine Raynell Moder, MD  pseudoephedrine (SUDAFED) 120 MG 12 hr tablet Take 120 mg by mouth every 12 (twelve) hours as needed for congestion.    [provider]    Allergies: Patient has no known allergies.    Review of Systems  Gastrointestinal:  Positive for nausea and vomiting.  Musculoskeletal:  Positive for arthralgias and myalgias.    Updated Vital Signs BP 131/85   Pulse 99   Temp 99.1 F (37.3 C) (Oral)   Resp (!) 23   Ht 5' 7 (1.702 m)   Wt 77.1 kg   SpO2 92%   BMI 26.62 kg/m   Physical Exam Vitals and nursing note reviewed.  Constitutional:  General: He is not in acute distress.    Appearance: He is well-developed. He is not toxic-appearing or diaphoretic.  HENT:     Head: Normocephalic and atraumatic.     Right Ear: External ear normal.     Left Ear: External ear normal.  Eyes:     Conjunctiva/sclera: Conjunctivae normal.  Cardiovascular:     Rate and Rhythm: Normal rate and regular rhythm.     Pulses: Normal pulses.  Pulmonary:     Effort: Pulmonary effort is normal. No respiratory distress.  Abdominal:     Palpations: Abdomen is soft.  Musculoskeletal:        General: No swelling.     Cervical back: Neck supple.  Skin:    General: Skin is warm and dry.     Capillary Refill: Capillary  refill takes less than 2 seconds.  Neurological:     General: No focal deficit present.     Mental Status: He is alert and oriented to person, place, and time.  Psychiatric:        Mood and Affect: Mood normal.     (all labs ordered are listed, but only abnormal results are displayed) Labs Reviewed  CBC WITH DIFFERENTIAL/PLATELET - Abnormal; Notable for the following components:      Result Value   WBC 16.3 (*)    RBC 2.30 (*)    Hemoglobin 8.7 (*)    HCT 24.2 (*)    MCV 105.2 (*)    MCH 37.8 (*)    RDW 19.6 (*)    Platelets 681 (*)    nRBC 10.3 (*)    Neutro Abs 9.9 (*)    Monocytes Absolute 2.0 (*)    Abs Immature Granulocytes 0.27 (*)    All other components within normal limits  COMPREHENSIVE METABOLIC PANEL WITH GFR - Abnormal; Notable for the following components:   Glucose, Bld 108 (*)    Total Protein 8.2 (*)    AST 51 (*)    Total Bilirubin 1.3 (*)    All other components within normal limits  RETIC PANEL - Abnormal; Notable for the following components:   Retic Ct Pct 10.5 (*)    RBC. 2.29 (*)    Retic Count, Absolute 240.9 (*)    Immature Retic Fract 39.0 (*)    All other components within normal limits  URINALYSIS, ROUTINE W REFLEX MICROSCOPIC - Abnormal; Notable for the following components:   Protein, ur 100 (*)    All other components within normal limits  URINALYSIS, MICROSCOPIC (REFLEX) - Abnormal; Notable for the following components:   Bacteria, UA RARE (*)    All other components within normal limits  LIPASE, BLOOD  RETICULOCYTES    EKG: None  Radiology: No results found.   Procedures   Medications Ordered in the ED  oxyCODONE  (Oxy IR/ROXICODONE ) immediate release tablet 10 mg (has no administration in time range)  ondansetron  (ZOFRAN ) injection 4 mg (4 mg Intravenous Given 05/16/24 2154)  HYDROmorphone  (DILAUDID ) injection 1 mg (1 mg Intravenous Given 05/16/24 2236)  ondansetron  (ZOFRAN ) injection 4 mg (4 mg Intravenous Given 05/16/24  2350)                                    Medical Decision Making Amount and/or Complexity of Data Reviewed Labs: ordered.  Risk Prescription drug management.   lalThis patient presents to the ED for concern of SCC and emesis differential  diagnosis includes viral GI illness, sickle cell crisis, sickle cell pain    Additional history obtained   Additional history obtained from Electronic Medical Record External records from outside source obtained and reviewed including previous admission documents   Lab Tests:  I Ordered, and personally interpreted labs.  The pertinent results include: Reticulocytes around baseline, anemia 8.7 which is around patient's baseline, elevated platelets at 681, leukocytosis at 16.3 which is not uncommon per historical values, AST 51, mildly elevated total bili at 1.3   Medicines ordered and prescription drug management:  I ordered medication including Zofran  and Dilaudid , oxycodone     I have reviewed the patients home medicines and have made adjustments as needed   Problem List / ED Course:  Considered for admission or further workup however patient's vital signs, physical exam, and labs are reassuring.  Patient does not appear to be in aplastic crisis at this time.  Patient has no signs or symptoms concerning for acute chest syndrome.  Patient tolerating p.o. intake without issue prior to discharge.  Patient given outpatient course of Phenergan  for nausea and vomiting.       Final diagnoses:  Sickle-cell disease with pain (HCC)  Nausea and vomiting, unspecified vomiting type    ED Discharge Orders          Ordered    promethazine  (PHENERGAN ) 25 MG suppository  Every 6 hours PRN        05/16/24 2352               Francis Ileana SAILOR, PA-C 05/16/24 2353    Francis Ileana SAILOR, PA-C 05/16/24 2354    Jerral Meth, MD 05/17/24 [phone]

## 2024-05-16 NOTE — Discharge Instructions (Addendum)
 Today you were seen for nausea and vomiting and sickle cell pain.  Your workup while in the emergency department was reassuring.  Please pick up your Phenergan  and take as needed for nausea and vomiting.  Thank you for letting us  treat you today. After reviewing your labs and imaging, I feel you are safe to go home. Please follow up with your PCP in the next several days and provide them with your records from this visit. Return to the Emergency Room if pain becomes severe or symptoms worsen.

## 2024-05-16 NOTE — ED Notes (Signed)
 Provided patient with beverage. EDP updated patient of plan of care while this RN in room.

## 2024-05-16 NOTE — ED Triage Notes (Signed)
 Pt states SCC with emesis. X 2 days Seen for same this week.

## 2024-05-17 DIAGNOSIS — F17290 Nicotine dependence, other tobacco product, uncomplicated: Secondary | ICD-10-CM | POA: Diagnosis not present

## 2024-05-17 DIAGNOSIS — E860 Dehydration: Secondary | ICD-10-CM | POA: Diagnosis not present

## 2024-05-17 DIAGNOSIS — R1115 Cyclical vomiting syndrome unrelated to migraine: Secondary | ICD-10-CM | POA: Diagnosis not present

## 2024-05-17 DIAGNOSIS — E2749 Other adrenocortical insufficiency: Secondary | ICD-10-CM | POA: Diagnosis not present

## 2024-05-17 DIAGNOSIS — Z79899 Other long term (current) drug therapy: Secondary | ICD-10-CM | POA: Diagnosis not present

## 2024-05-17 DIAGNOSIS — N289 Disorder of kidney and ureter, unspecified: Secondary | ICD-10-CM | POA: Diagnosis not present

## 2024-05-17 DIAGNOSIS — D5700 Hb-SS disease with crisis, unspecified: Secondary | ICD-10-CM | POA: Diagnosis not present

## 2024-05-17 NOTE — ED Provider Notes (Signed)
 Initial Provider Assessment  Interpreter used: Level of Interpreter Services: No interpreter needed (no language barrier) Historian: patient  HPI: Pt is a 33 y.o. male with history as listed below who presents to the ED with Sickle Cell Pain Crisis and Abdominal Pain   Patient reporting nausea, vomiting, abdominal pain, low back pain.  He reports that his back pain is his typical sickle cell pain.  No fevers, no chest pain  Past Medical History:  Diagnosis Date   Anemia    Migraine 01/06/2012   Pneumonia 10/06/2011   Sickle cell anemia (CMS/HHS-HCC)    average values:  HGB-10, HCT-30, O2 sat 98-100% on Room air   Sleep apnea     Past Surgical History:  Procedure Laterality Date   SPINE SURGERY  11/05/2005   EXTRACTION TEETH Bilateral 05/07/2008   ESOPHAGOGASTRODOUDENOSCOPY W/BIOPSY  01/30/2024   Procedure: ESOPHAGOGASTRODUODENOSCOPY, FLEXIBLE, TRANSORAL; WITH BIOPSY, SINGLE OR MULTIPLE;  Surgeon: Levern Alice, MD;  Location: [facility];  Service: Gastroenterology;;   CHOLECYSTECTOMY       Vitals:   05/17/24 1849  BP: (!) 160/103  BP Location: Right upper arm  Patient Position: Sitting  Pulse: 104  Resp: 18  Temp: 36.7 C (98.1 F)  TempSrc: Tympanic  SpO2: 97%   Focused Physical Exam: Gen: No Acute Distress HEENT: Normocephalic and atraumatic CV: Regular rate Lung: No respiratory distress Abd: Soft, non-tender, non-distended Neuro: Alert and awake, moving all extremities well  Medical Decision Making and Plan: Given the patients initial provider assessment, the following diagnostic evaluation and therapeutic interventions have been ordered. The patient will be placed in the appropriate treatment space, once one is available, to complete the evaluation and treatment.  I have discussed the plan of care with the patient. Prior notes reviewed: yes Tests ordered:  Orders Placed This Encounter  Procedures   Complete Blood Count (CBC) with Differential    Comprehensive Metabolic Panel (CMP)   Magnesium    Lipase   Culture, Urine, Routine with Pyuria Screen (reflexed from Urinalysis)   Reticulocytes   Lactate Dehydrogenase (LDH)   Treatments ordered:  Medications  HYDROmorphone  (DILAUDID ) 1 mg/mL inj syringe 2 mg (has no administration in time range)  diphenhydrAMINE  (BENADRYL ) capsule 25 mg (has no administration in time range)  ondansetron  (PF) (ZOFRAN ) injection 4 mg (has no administration in time range)     Gailen Duwaine Gunner, MD 05/17/24 [phone]

## 2024-05-17 NOTE — ED Provider Notes (Signed)
 [facility] EMERGENCY DEPT  ED Provider Note History   Chief Complaint  Patient presents with   Sickle Cell Pain Crisis   Abdominal Pain    Patient Info:    History provided by:  Patient and medical records  Level of Interpreter Services: No interpreter needed (no language barrier)  Manuel Mcguire is a 33 y.o. male with pmh of sickle cell disease, cyclic vomiting syndrome, and recently diagnosed secondary adrenal insufficiency who presents to the ED for flank pain/low back pain in the setting of recurrent bouts of nausea and vomiting with p.o. intolerance.  The patient endorses a 1 year history of cyclical vomiting episodes and has undergone trial of several agents to help treat this.  About 2 months ago, he was diagnosed with adrenal insufficiency and was started on hydrocortisone  supplementation which has overall improved his abdominal pain.  About 2 weeks ago, he had an acute pain crisis with concurrent nausea and vomiting making it difficult to tolerate p.o. intake.  More recently, he was seen at [facility] health ED for abdominal pain, was treated with IV pain medications and ultimately discharged.  He presents today due to frequent nausea and vomiting making it difficult to hold down his p.o. medications at home.  Has been tolerating small sips of fluid, but continues to have several episodes of emesis daily with associate abdominal pain.  Denies diarrhea, bloody stools, dysuria, increased urinary frequency, fevers, chills, cough, congestion, sore throat, chest pain, shortness of breath.  No recent falls or injuries.  No saddle anesthesia, incontinence.  He last took his hydrocortisone  at 4 PM.  On my assessment, he had already received 3 doses of IV Dilaudid  per his care plan and continues to endorse 8 out of 10 pain.  He denies tobacco or recreational drug use.  Notes intermittent alcohol use.   Past Medical History:  Diagnosis Date   Anemia    Migraine 01/06/2012   Pneumonia 10/06/2011    Sickle cell anemia (CMS/HHS-HCC)    average values:  HGB-10, HCT-30, O2 sat 98-100% on Room air   Sleep apnea    Past Surgical History:  Procedure Laterality Date   SPINE SURGERY  11/05/2005   EXTRACTION TEETH Bilateral 05/07/2008   ESOPHAGOGASTRODOUDENOSCOPY W/BIOPSY  01/30/2024   Procedure: ESOPHAGOGASTRODUODENOSCOPY, FLEXIBLE, TRANSORAL; WITH BIOPSY, SINGLE OR MULTIPLE;  Surgeon: Levern Alice, MD;  Location: [facility];  Service: Gastroenterology;;   CHOLECYSTECTOMY     Family History  Problem Relation Age of Onset   High blood pressure (Hypertension) Mother    Sickle cell trait Mother    Sickle cell trait Father    High blood pressure (Hypertension) Father    Sickle cell anemia Sister    Social History   Socioeconomic History   Marital status: Single   Years of education: college  Tobacco Use   Smoking status: Former   Smokeless tobacco: Current   Tobacco comments:    Vape on rare occasions   Vaping Use   Vaping status: Every Day  Substance and Sexual Activity   Alcohol use: No   Drug use: No   Social Drivers of Corporate Investment Banker Strain: Low Risk  (03/28/2024)   Overall Financial Resource Strain (CARDIA)    Difficulty of Paying Living Expenses: Not hard at all  Food Insecurity: No Food Insecurity (03/28/2024)   Hunger Vital Sign    Worried About Running Out of Food in the Last Year: Never true    Ran Out of Food in the  Last Year: Never true  Transportation Needs: No Transportation Needs (03/28/2024)   PRAPARE - Administrator, Civil Service (Medical): No    Lack of Transportation (Non-Medical): No  Social Connections: Moderately Isolated (01/21/2024)   Received from [facility]   Social Connection and Isolation Panel    In a typical week, how many times do you talk on the phone with family, friends, or neighbors?: More than three times a week    How often do you get together with friends or relatives?:  Once a week    How often do you attend church or religious services?: More than 4 times per year    Do you belong to any clubs or organizations such as church groups, unions, fraternal or athletic groups, or school groups?: No    How often do you attend meetings of the clubs or organizations you belong to?: Never    Are you married, widowed, divorced, separated, never married, or living with a partner?: Never married  Housing Stability: Low Risk  (03/23/2024)   Housing Stability Vital Sign    Unable to Pay for Housing in the Last Year: No    Number of Times Moved in the Last Year: 0    Homeless in the Last Year: No   Review of Systems - as per hpi  Physical Exam  BP (!) 160/103 (BP Location: Right upper arm, Patient Position: Sitting)   Pulse 104   Temp 36.7 C (98.1 F) (Tympanic)   Resp 18   SpO2 97%  Physical Exam Vitals and nursing note reviewed.  Constitutional:      General: He is not in acute distress.    Comments: Uncomfortable.  HENT:     Head: Normocephalic and atraumatic.     Mouth/Throat:     Mouth: Mucous membranes are moist.  Eyes:     Extraocular Movements: Extraocular movements intact.  Cardiovascular:     Rate and Rhythm: Normal rate and regular rhythm.     Heart sounds: Normal heart sounds.  Pulmonary:     Effort: Pulmonary effort is normal.     Breath sounds: Normal breath sounds.  Abdominal:     General: There is distension (Mild).     Palpations: Abdomen is soft.     Tenderness: There is generalized abdominal tenderness. There is no right CVA tenderness, left CVA tenderness, guarding or rebound.  Skin:    General: Skin is warm and dry.     Capillary Refill: Capillary refill takes less than 2 seconds.  Neurological:     Mental Status: He is alert.    Procedures  Procedures   Results  Medical Decision Making   Orders Placed This Encounter  Procedures   CT abdomen pelvis with contrast   Complete Blood Count (CBC) with Differential    Comprehensive Metabolic Panel (CMP)   Magnesium    Lipase   Culture, Urine, Routine with Pyuria Screen (reflexed from Urinalysis)   Reticulocytes   Lactate Dehydrogenase (LDH)   Manual White Blood Cell (WBC) Differential      Medical Complexity:    New and requires workup.     Pertinent labs & imaging results that were available during my care of the patient were reviewed by me and considered in my medical decision making.     I reviewed previous medical records.     I independently visualized image(s), tracing(s), and/or specimen(s).     I discussed the patient with another provider.  GEN med  Shashank Kwasnik is a 33 y.o. male with history as listed above who presents to the ED for abdominal pain, low back pain, and frequent nausea and vomiting.  Vitals notable for hypertension and mild tachycardia, otherwise normal.  Exam as above.  I suspect his presentation today is most likely consistent with acute vaso-occlusive pain crisis triggered by p.o. intolerance from cyclic vomiting syndrome, though additionally considered appendicitis, pancreatitis, biliary pathology such as cholecystitis or choledocholithiasis though reassuringly does not have a positive Murphy sign on exam, UTI/pyelonephritis given flank pain described though he does not have CVA tenderness on exam, and reassuringly no red flag back pain symptoms such as incontinence, saddle anesthesia, lower extremity weakness, fevers to raise concern for spinal epidural abscess or cauda equina.  Will obtain basic labs and CT spine pelvis to evaluate further.  The patient had already received Zofran , Benadryl , and Dilaudid  2 mg x 3 prior to my assessment and continued to have uncontrolled pain at 8 out of 10 in severity.  At this point in time, he was started on PCA pump per his care plan.  Labs notable for leukocytosis to 20.4 with neutrophilic predominance, LDH 443, appropriate reticulocytosis with hemoglobin 9.0  (baseline).  Lipase normal.  Also has a thrombocytosis, normal creatinine and electrolytes, elevated T. bili at 1.8.  Chest x-ray did not show any acute findings.  CT abdomen pelvis showed no acute abnormalities.  Patient was admitted to general medicine for further care.  Disposition: Admit to GEN med    ED Course as of 05/18/24 0134  Fri May 18, 2024  0003 Creatinine: 1.0 [RV]  0003 Lipase: 27 Reassuring against pancreatitis [RV]  0053 CT abdomen pelvis with contrast No obvious intra-abdominal infection on my review [RV]  0113 CT abdomen pelvis with contrast No acute abnormalities in the abdomen or pelvis. [RV]  0133 Accepted to gen med [RV]    ED Course User Index [RV] Vigue, Raelynn Marie, MD      Medications Administered in the Emergency Department   HYDROmorphone  (DILAUDID ) 1 mg/mL inj syringe 2 mg (2 mg Intravenous Given 05/17/24 2136) diphenhydrAMINE  (BENADRYL ) capsule 25 mg (25 mg Oral Given 05/17/24 2004) ondansetron  (PF) (ZOFRAN ) injection 4 mg (4 mg Intravenous Given 05/17/24 2006)   ED Clinical Impression  1. Sickle cell pain crisis (CMS/HHS-HCC)               This note has been created using automated tools and reviewed for accuracy by RAELYNN MARIE VIGUE.   AI Note Feedback-- Point of Care Survey   Vigue, Raelynn Marie, MD Resident 05/18/24 [phone]

## 2024-05-18 DIAGNOSIS — R1115 Cyclical vomiting syndrome unrelated to migraine: Secondary | ICD-10-CM | POA: Diagnosis not present

## 2024-05-18 DIAGNOSIS — E2740 Unspecified adrenocortical insufficiency: Secondary | ICD-10-CM | POA: Diagnosis not present

## 2024-05-18 NOTE — Discharge Summary (Signed)
 [name] Discharge Summary  Admit Date: 05/17/2024 Discharge Date: 05/21/2024  11:07 AM  Admitting Physician: Lynwood Taft Barrio, MD Discharge Physician: Donal Josefa Lenis, MD  Primary Care Provider: Irean Karolynn Batters, NP, Phone [phone]  Discharge Destination: Home  Admission Diagnoses:  Sickle cell pain crisis (CMS/HHS-HCC) [D57.00]  Discharge Diagnoses:  Principal Problem:   Sickle cell pain crisis (CMS/HHS-HCC) Active Problems:   Hemoglobin SS disease    Cyclic vomiting syndrome   Adrenal insufficiency (HHS-HCC) Resolved Problems:   * No resolved hospital problems. *  Primary Diagnosis: Admitted for sickle cell crisis   Changes Made (with rationale):  Increased amitriptyline to 50mg  nightly   To-Do List (incidental findings, follow-up studies, etc.): Will need new refill for hydrocortisone  in Feb  Anticipatory Guidance for Outpatient Care:  See patient care coordination note on summary page for careplan     Results Pending at Discharge:  None Please see phone numbers at end of this summary for lab contact information.   Follow-up/Care Transition Plan: Sched. appts: Future Appointments  Date Time Provider Department Center  06/28/2024  1:30 PM Dep, Karolynn Batters, NP HEMATOLOGY [facility]  08/01/2024  3:20 PM Tiny Alpers, MD ENDOCRIN [facility]  10/23/2024 12:40 PM Dorcas Rosina Guernsey, NP [facility] [place]  04/09/2025 11:30 AM Cheril Lukes, PA DD[facility]    Follow-up info: No follow-up provider specified.     Allergies/Intolerances:  No Known Allergies   New Adverse Drug Events: none  Medications:     Current Discharge Medication List     These medications have been CHANGED      Instructions  amitriptyline 25 MG tablet Quantity: 90 tablet Refills: 0 What changed: how much to take  Commonly known as: ELAVIL Take 2 tablets (50 mg total) by mouth at bedtime Last time this  was given: 50 mg on May 20, 2024  8:04 PM       CONTINUE taking these medications      Instructions  BD LUER-LOK SYRINGE 3 mL 25 gauge x 1 Syrg Quantity: 1 each Refills: 0 Generic drug: syringe with needle  Use 1 each as directed (for steroid administration)   CALCIUM 500 ORAL Refills: 0  Take by mouth   cholecalciferol  2,000 unit capsule Quantity: 30 capsule Refills: 3  Commonly known as: VITAMIN D3 Take 1 capsule (2,000 Units total) by mouth once daily Last time this was given: Ask your nurse or doctor   folic acid  1 MG tablet Quantity: 90 tablet Refills: 2  Commonly known as: FOLVITE  Take 1 tablet (1,000 mcg total) by mouth once daily Last time this was given: Ask your nurse or doctor   hydrocortisone  10 MG tablet Quantity: 180 tablet Refills: 0  Commonly known as: CORTEF  Take 2 tablets (20 mg total) by mouth once daily AND 1 tablet (10 mg total) every evening. Do all this for 60 days. Last time this was given: 20 mg on May 21, 2024  8:10 AM   hydroxyurea  500 mg capsule Quantity: 270 capsule Refills: 1  Commonly known as: HYDREA  Take 3 capsules (1,500 mg total) by mouth once daily Last time this was given: 1,500 mg on May 21, 2024  8:11 AM   lisinopriL  2.5 MG tablet Quantity: 90 tablet Refills: 1  Commonly known as: ZESTRIL  Take 1 tablet (2.5 mg total) by mouth once daily Last time this was given: 2.5 mg on May 21, 2024  8:10 AM   multivitamin tablet Refills: 0  Take 1  tablet by mouth once daily Reported on 06/06/2015   naloxone  4 mg/actuation nasal spray Quantity: 1 each Refills: 1  Commonly known as: NARCAN  Place 1 spray (4 mg total) into one nostril once as needed (if not breathing.) for up to 1 dose   ondansetron  4 MG disintegrating tablet Quantity: 30 tablet Refills: 3  Commonly known as: ZOFRAN -ODT Take 1 tablet (4 mg total) by mouth every 8 (eight) hours as needed for Vomiting or Nausea Last time this was given: Ask  your nurse or doctor   oxyCODONE  5 MG immediate release tablet Quantity: 90 tablet Refills: 0  Commonly known as: ROXICODONE  Take 1 tablet (5 mg total) by mouth every 6 (six) hours as needed for Pain (for moderate to severe pain) Doctor's comments: To be dispensed on or after 05/14/2024   pantoprazole 40 MG DR tablet Quantity: 30 tablet Refills: 3  Commonly known as: PROTONIX Take 1 tablet (40 mg total) by mouth once daily Last time this was given: 40 mg on May 21, 2024  8:11 AM   promethazine  12.5 MG tablet Refills: 0  Commonly known as: PHENERGAN  Take 12.5 mg by mouth every 8 (eight) hours as needed for Nausea or Vomiting   promethazine  25 MG suppository Refills: 0  Commonly known as: PHENERGAN  Place 25 mg rectally every 6 (six) hours as needed for Vomiting or Nausea   pseudoephedrine 120 mg 12 hr tablet Refills: 0  Commonly known as: SUDAFED 12 HR Take 120 mg by mouth   Solu-CORTEF  Act-O-Vial (PF) 100 mg/2 mL injection Quantity: 1 each Refills: 0 Generic drug: hydrocortisone  (PF)  Inject 2 mLs (100 mg total) into the muscle as directed (for stress dose steroid on sick day) Last time this was given: Ask your nurse or doctor         Brief History of Present Illness:   Manuel Mcguire is a 33 y.o. male with a past medical history of sickle cell disease, cyclic vomiting syndrome, and adrenal insufficiency who presents the ED with complaints of lower back pain and recurrent vomiting and inability to take his home meds.   He has experienced recurrent vomiting episodes throughout the past year, initially finding some relief with certain solutions such as amitriptyline nightly for a month or two, but the vomiting resumed. Over the past two weeks, the vomiting has increased in frequency, making it challenging to retain his medications, including hydrocortisone .   He was diagnosed with adrenal insufficiency at recent hospitalization here 10/16 - 10/21 and was  prescribed hydrocortisone  taking 20mg  in the morning and 10mg  in the afternoon, which initially improved his chronic nausea significantly and lasted for couple of months. Despite taking the medication, he continues to feel nauseous and occasionally vomit. He has not yet used the prescribed hydrocortisone  injection for sick days but has received IV hydrocortisone  ER during previous visits which typically helps with his nausea.   He has a history of sickle cell disease and experiences pain crises, particularly in the lower back, which is a typical location for his pain.  Currently having 8/10 lower back pain, originally was 10/10.  Baseline pain 2/10 at home he takes 5 to 10 mg oxycodone  as needed for pain control but this has not been effective and he has also had a lot of vomiting.  Feels weak but is able to walk. No trouble breathing, fever, cough, sore throat, or runny nose.   His current medications include amitriptyline at bedtime, calcium carbonate, vitamin D , folic acid , hydroxyurea  (  three pills a day), lisinopril , a multivitamin, and oxycodone  for pain, which he double up to 10 mg but finds less effective due to vomiting. He also uses Phenergan  for nausea, but it has not been effective. _____________________   Hospital Course by Problem:  SICKLE CELL INPATIENT SUMMARY - Medication: Hydromorphone  (Dilaudid ) PCA settings: initial 0.3mg  demand, 0.6mg  bolus; max same, transitioned to orals from this dose on day of discharge  - Transfusions: none - Complications: none - Discharge meds: IV antibiotics: none; opioids prescribed: none  Sickle cell disease with acute uncomplicated pain crisis  Acute pain crisis likely due to dehydration and vomiting with inability to hold down meds or p.o. Pain in lower back, typical for sickle cell. No respiratory distress or fever. Pain treated with dilaudid  PCA and tylenol . Continued home hydroxyurea  and folic acid .    Adrenal insufficiency  Chronic adrenal  insufficiency exacerbated by vomiting and difficulty keeping down oral hydrocortisone . Discussed potential for dehydration and sick day dosing and that IV hydrocortisone  beneficial during severe episodes.  Home dose of hydrocortisone  is 20 mg every morning 10 mg every afternoon. Treated with sick day doses for 2 days and then transitioned back to home doses. Patient reported having used his 1 time IM solucortef dose and a new Rx for a refill was sent.    Cyclic vomiting syndrome  Has had improvement with initiation of amitriptyline 25mg  nightly, increased to 50mg  nightly. Treated with prn antiemetics. Could consider scheduled reglan  in the future if needed.    Social Drivers of Health with Concerns   Tobacco Use: Unknown (05/16/2024)   Received from [facility]   Patient History    Smoking Tobacco Use: Never    Smokeless Tobacco Use: Unknown  Recent Concern: Tobacco Use - High Risk (04/04/2024)   Patient History    Smoking Tobacco Use: Former    Smokeless Tobacco Use: Current  Physicist, Medical Strain: Low Risk  (05/18/2024)   Overall Financial Resource Strain (CARDIA)    Difficulty of Paying Living Expenses: Not very hard  Recent Concern: Financial Resource Strain - High Risk (05/18/2024)   Overall Financial Resource Strain (CARDIA)    Difficulty of Paying Living Expenses: Very hard  Social Connections: Moderately Isolated (01/21/2024)   Received from [facility]   Social Connection and Isolation Panel    In a typical week, how many times do you talk on the phone with family, friends, or neighbors?: More than three times a week    How often do you get together with friends or relatives?: Once a week    How often do you attend church or religious services?: More than 4 times per year    Do you belong to any clubs or organizations such as church groups, unions, fraternal or athletic groups, or school groups?: No    How often do you attend meetings of the clubs or  organizations you belong to?: Never    Are you married, widowed, divorced, separated, never married, or living with a partner?: Never married    Surgeries and Procedures Performed:  none  _____________________  Discharge Exam:  BP 135/78 (BP Location: Right upper arm, Patient Position: Lying)   Pulse 71   Temp 36.6 C (97.9 F) (Oral)   Resp 18   Ht 170.2 cm (5' 7)   Wt 79.8 kg (175 lb 14.8 oz)   SpO2 98%   BMI 27.55 kg/m    Exam: General: alert, cooperative, in NAD Eyes: conjunctiva clear, anicteric sclera HENT: oropharynx clear,  moist mucous membranes CV: regular rate and rhythm, without murmurs, rubs or gallops Resp: respirations even and unlabored, bilateral lungs clear to auscultation, good air exchange Abd: soft, nontender, nondistended, normoactive bowel sounds Ext: warm, dry, no lower extremity edema Skin: no rashes or lesions Neuro: alert and oriented x4 grossly normal and symmetric strength in upper and lower extremities intact sensation to light touch throughout Psych: calm, mood and affect are appropriate    Pertinent Lab Testing: Recent Labs  Lab 05/19/24 0414 05/20/24 0351 05/21/24 0407  NA 136 135 138  K 3.9 4.2 4.3  CL 103 102 104  CO2 26 24 26   BUN 9 10 8   CREATININE 0.9 1.0 0.9  GLUCOSE 89 99 99  CALCIUM 8.4* 8.6* 8.9   Recent Labs  Lab 05/17/24 2004  AST 36  ALT 24  ALKPHOS 79  TBILI 1.8*    Recent Labs  Lab 05/19/24 0414 05/20/24 0351 05/21/24 0407  WBC 16.4* 14.9* 14.7*  HGB 7.2* 8.3* 8.6*  HCT 20.2* 22.8* 24.3*  PLT 476* 624* 537*   No results for input(s): APTT, INR in the last 168 hours.   Other Pertinent Labs:  none  Micro:  No results found for the last 90 days.      Pertinent Imaging:   X-ray chest PA and lateral Result Date: 05/18/2024 XR CHEST PA AND LATERAL INDICATION: Lung Aeration, D57.00 Hb-SS disease with crisis, unspecified (CMS/HHS-HCC) COMPARISON: 05/14/2020 FINDINGS/IMPRESSION: 1.  Stable  cardiomediastinal contours. 2.  The bilateral lungs are clear. No pneumothorax or pleural effusion. 3.  Thoracic fusion hardware in place. The preliminary report (critical or emergent communication) was reviewed prior to this dictation and there are no critical differences between the preliminary results and the impressions in this final report.  Electronically Reviewed by:  Burnard maple Mon, MD, [facility] Electronically Reviewed on:  05/18/2024 7:34 AM I have reviewed the images and concur with the above findings. Electronically Signed by:  Rea Marc, MD, [facility] Electronically Signed on:  05/18/2024 10:53 AM  CT abdomen pelvis with contrast Result Date: 05/18/2024 CT abdomen and pelvis with IV contrast Comparison:  None available. Indication:  Abdominal abscess/infection suspected, D57.00 Hb-SS disease with crisis, unspecified (CMS/HHS-HCC). Technique:  CT imaging was performed of the abdomen and pelvis following the administration of intravenous contrast.  Iodinated contrast was used due to the indications for the examination, to improve disease detection and further define anatomy. Coronal and sagittal reformatted images were generated and reviewed. Findings: - Lower Thorax: No suspicious pulmonary abnormalities. Trace pericardial effusion. No pleural effusions. - Liver: Normal in morphology and enhancement.  No suspicious hepatic masses are identified.  The portal and hepatic veins are patent. - Biliary and Gallbladder: No intrahepatic or extrahepatic bile duct dilatation. The gallbladder is surgically absent. - Spleen: Atrophic calcified spleen, in keeping with sequela of sickle cell disease. - Pancreas: Normal in appearance. - Adrenal Glands: Normal in appearance. - Kidneys: Symmetric in enhancement. 1.4 cm hypoattenuating lesion arising from the posterior right upper pole, technically indeterminate and with limited characterization due to extensive metallic streak artifact. No overtly  suspicious features identified within this lesion. Additional subcentimeter hypoattenuating lesion within the right lower pulse to small to accurately characterize. No hydronephrosis. - Abdominal and Pelvic Vasculature: No abdominal aortic aneurysm. - Gastrointestinal Tract: No abnormal dilation or wall thickening. Normal appendix. - Peritoneum/Mesentery/Retroperitoneum: No free fluid.  No free intraperitoneal air. - Lymph Nodes: No retroperitoneal, mesenteric, pelvic, or inguinal lymphadenopathy.  - Bladder: Normal in  appearance. - Pelvic Organs: Unremarkable. - Body Wall: Unremarkable. - Musculoskeletal:  No aggressive appearing osseous lesions. Left femoral fixation hardware is present. 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 Partially visualized thoracic spinal fusion hardware. Ill-defined sclerosis of the visualized vertebral bodies, likely osseous sequela of sickle cell disease. No definite fracture identified. Impression: 1.  No acute infectious or inflammatory findings identified within the abdomen and pelvis. 2.  1.4 cm hypoattenuating lesion arising from the posterior right kidney whose characterization is limited due to adjacent metallic streak artifact. Recommend nonurgent/outpatient renal ultrasound to attempt to further characterize. The preliminary report (critical or emergent communication) was reviewed prior to this dictation and there are no critical differences between the preliminary results and the impressions in this final report. Electronically Signed by:  Verneita Pitter, MD, [facility] Electronically Signed on:  05/18/2024 8:16 AM  _____________________  Code Status: Full Code Goals of care were addressed during this admission: unchanged.   Status on Discharge:  Current activity: Walks occasionally (05/21/24 0810) Current mobility: No limitation (05/21/24 0810)  Activity Recommendation: activity as tolerated and no driving while on  analgesics  Other Discharge Instructions: Services setup at discharge: None Tubes/lines at discharge: None  Diet: Diet regular  Wound Care Order Instructions     None       _____________________  Time spent on discharge process: 35 minutes    ROSALINE HEINRICH, NP [facility]  05/21/2024   Hospital Contact Information:  [place] [facility]) [facility]) [facility])  Pending tests:  Laboratory: [phone] Microbiology: [phone] Pathology: [phone] Radiology: [phone]  General questions: [phone] Pending tests: Laboratory: [phone] Microbiology: [phone] Pathology: [phone] Radiology: [phone]  General questions:  [phone] Pending tests:  Laboratory: [phone] Microbiology: [phone] Pathology: [phone] Radiology: [phone]  General questions:  [phone] Pending tests: Laboratory: [phone]) [phone] Microbiology: [phone] Pathology: [phone] Radiology: [phone]  General questions:  [phone]

## 2024-05-18 NOTE — H&P (Addendum)
 Hospital Medicine Admission History & Physical  Time of Service: 05/18/2024, 6:26 AM  PCP: Irean Karolynn Batters, NP, Phone [phone], Fax [phone]   Chief Complaint  Lower back pain, nausea and vomiting  History of Present Illness  Manuel Mcguire is a 33 y.o. male with a past medical history of sickle cell disease, cyclic vomiting syndrome, and adrenal insufficiency who presents the ED with complaints of lower back pain and recurrent vomiting and inability to take his home meds.  He has experienced recurrent vomiting episodes throughout the past year, initially finding some relief with certain solutions such as amitriptyline nightly for a month or two, but the vomiting resumed. Over the past two weeks, the vomiting has increased in frequency, making it challenging to retain his medications, including hydrocortisone .  He was diagnosed with adrenal insufficiency at recent hospitalization here 10/16 - 10/21 and was prescribed hydrocortisone  taking 20mg  in the morning and 10mg  in the afternoon, which initially improved his chronic nausea significantly and lasted for couple of months. Despite taking the medication, he continues to feel nauseous and occasionally vomit. He has not yet used the prescribed hydrocortisone  injection for sick days but has received IV hydrocortisone  ER during previous visits which typically helps with his nausea.  He has a history of sickle cell disease and experiences pain crises, particularly in the lower back, which is a typical location for his pain.  Currently having 8/10 lower back pain, originally was 10/10.  Baseline pain 2/10 at home he takes 5 to 10 mg oxycodone  as needed for pain control but this has not been effective and he has also had a lot of vomiting.  Feels weak but is able to walk. No trouble breathing, fever, cough, sore throat, or runny nose.  His current medications include amitriptyline at bedtime, calcium carbonate, vitamin D , folic acid ,  hydroxyurea  (three pills a day), lisinopril , a multivitamin, and oxycodone  for pain, which he double up to 10 mg but finds less effective due to vomiting. He also uses Phenergan  for nausea, but it has not been effective.  On presentation to the ED, vitals were T 36.7 C (98.1 F), HR 104, BP (!) 160/103, RR 18, and satting 97 % on RA. Labs notable for Na 138, K 4.0, HCO3 27, BUN 7, sCr 1.0. CBC notable for WBC 20.4*, Hgb 9.0*, plts 790*.  Received IV pain medications and started on Dilaudid  PCA and then admitted to medicine for further evaluation.   Medical History  Past Medical History Past Medical History:  Diagnosis Date   Anemia    Migraine 01/06/2012   Pneumonia 10/06/2011   Sickle cell anemia (CMS/HHS-HCC)    average values:  HGB-10, HCT-30, O2 sat 98-100% on Room air   Sleep apnea     Past Surgical History Past Surgical History:  Procedure Laterality Date   SPINE SURGERY  11/05/2005   EXTRACTION TEETH Bilateral 05/07/2008   ESOPHAGOGASTRODOUDENOSCOPY W/BIOPSY  01/30/2024   Procedure: ESOPHAGOGASTRODUODENOSCOPY, FLEXIBLE, TRANSORAL; WITH BIOPSY, SINGLE OR MULTIPLE;  Surgeon: Levern Alice, MD;  Location: [facility];  Service: Gastroenterology;;   CHOLECYSTECTOMY      Family History Family History  Problem Relation Name Age of Onset   High blood pressure (Hypertension) Mother     Sickle cell trait Mother     Sickle cell trait Father     High blood pressure (Hypertension) Father     Sickle cell anemia Sister      Social History Social History   Socioeconomic History  Marital status: Single   Years of education: college  Tobacco Use   Smoking status: Former   Smokeless tobacco: Current   Tobacco comments:    Vape on rare occasions   Vaping Use   Vaping status: Every Day  Substance and Sexual Activity   Alcohol use: No   Drug use: No   Social Drivers of Corporate Investment Banker Strain: High Risk (05/18/2024)   Overall Financial  Resource Strain (CARDIA)    Difficulty of Paying Living Expenses: Very hard  Food Insecurity: No Food Insecurity (05/18/2024)   Hunger Vital Sign    Worried About Running Out of Food in the Last Year: Never true    Ran Out of Food in the Last Year: Never true  Transportation Needs: No Transportation Needs (05/18/2024)   PRAPARE - Administrator, Civil Service (Medical): No    Lack of Transportation (Non-Medical): No    Allergies & Medications  No Known Allergies  Medications Prior to Admission Medications  Prescriptions Last Dose Taking?  SOLU-CORTEF  ACT-O-VIAL, PF, 100 mg/2 mL injection Unknown No  Sig: Inject 2 mLs (100 mg total) into the muscle as directed (for stress dose steroid on sick day)  amitriptyline (ELAVIL) 25 MG tablet 05/17/2024 Yes  Sig: TAKE 1 TABLET BY MOUTH EVERYDAY AT BEDTIME  calcium carbonate (CALCIUM 500 ORAL) 05/17/2024 Yes  Sig: Take by mouth  cholecalciferol  (VITAMIN D3) 2,000 unit capsule 05/17/2024 Yes  Sig: Take 1 capsule (2,000 Units total) by mouth once daily  folic acid  (FOLVITE ) 1 MG tablet 05/17/2024 Yes  Sig: Take 1 tablet (1,000 mcg total) by mouth once daily  hydrocortisone  (CORTEF ) 10 MG tablet 05/17/2024 Yes  Sig: Take 2 tablets (20 mg total) by mouth once daily AND 1 tablet (10 mg total) every evening. Do all this for 60 days.  hydroxyurea  (HYDREA ) 500 mg capsule 05/17/2024 Yes  Sig: Take 3 capsules (1,500 mg total) by mouth once daily  lisinopriL  (ZESTRIL ) 2.5 MG tablet 05/17/2024 Yes  Sig: Take 1 tablet (2.5 mg total) by mouth once daily  multivitamin tablet 05/17/2024 Yes  Sig: Take 1 tablet by mouth once daily Reported on 06/06/2015  naloxone  (NARCAN ) 4 mg/actuation nasal spray Unknown No  Sig: Place 1 spray (4 mg total) into one nostril once as needed (if not breathing.) for up to 1 dose  ondansetron  (ZOFRAN -ODT) 4 MG disintegrating tablet Past Month Yes  Sig: Take 1 tablet (4 mg total) by mouth every 8 (eight) hours  as needed for Vomiting or Nausea  oxyCODONE  (ROXICODONE ) 5 MG immediate release tablet 05/17/2024 Yes  Sig: Take 1 tablet (5 mg total) by mouth every 6 (six) hours as needed for Pain (for moderate to severe pain)  pantoprazole (PROTONIX) 40 MG DR tablet 05/17/2024 Yes  Sig: Take 1 tablet (40 mg total) by mouth once daily  promethazine  (PHENERGAN ) 12.5 MG tablet 05/17/2024 Yes  Sig: Take 12.5 mg by mouth every 8 (eight) hours as needed for Nausea or Vomiting  promethazine  (PHENERGAN ) 25 MG suppository 05/17/2024 Yes  Sig: Place 25 mg rectally every 6 (six) hours as needed for Vomiting or Nausea  pseudoephedrine (SUDAFED 12 HR) 120 mg 12 hr tablet Unknown No  Sig: Take 120 mg by mouth  syringe with needle (BD LUER-LOK SYRINGE) 3 mL 25 gauge x 1 Syrg Unknown No  Sig: Use 1 each as directed (for steroid administration)    Facility-Administered Medications: Manuel Mcguire     Review of Systems  A complete review  of systems was performed and is negative except as reviewed in the HPI.  Physical Exam    Current Vital Signs 24h Vital Sign Ranges  T 36.8 C (98.2 F) (05/18/24 0405) Temp  Avg: 36.8 C (98.2 F)  Min: 36.5 C (97.7 F)  Max: 37.1 C (98.7 F)  BP (!) 137/92 (05/18/24 0605) BP  Min: 137/92  Max: 160/103  HR 87 (05/18/24 0605) Pulse  Avg: 105.8  Min: 81  Max: 154  RR 20 (05/18/24 0605) Resp  Avg: 20  Min: 17  Max: 25  O2sat 97 %   SpO2  Avg: 96.6 %  Min: 94 %  Max: 98 %  Weight 79.8 kg (175 lb 14.8 oz) (05/18/24 0412)   Body mass index is 27.55 kg/m. General: alert, cooperative, robust, obese, in NAD uncomfortable appearing Eyes: EOMI, conjunctiva clear, anicteric sclera HENT: oropharynx clear, dry mucous membranes Neck: no JVD and supple, symmetrical, trachea midline CV: regular rate and rhythm, without murmurs, rubs or gallops Resp: clear to auscultation, good air exchange Abd: soft, nontender, nondistended, normoactive bowel sounds  Rectal: deferred Ext: no lower extremity  edema Skin: no rashes or lesions Psych: oriented to time, place and person, mood and affect are appropriate Neuro: Grossly normal Normal coordination.no focal deficits, MAEW x4  Data   Recent Results (from the past 24 hours)  Complete Blood Count (CBC) with Differential   Collection Time: 05/17/24  8:04 PM  Result Value Ref Range   WBC (White Blood Cell Count) 20.4 (H) 3.2 - 9.8 x109/L   Hemoglobin 9.0 (L) 13.7 - 17.3 g/dL   Hematocrit 74.9 (L) 60.9 - 49.0 %   Platelets 790 (H) 150 - 450 x109/L   MCV (Mean Corpuscular Volume) 106 (H) 80 - 98 fL   MCH (Mean Corpuscular Hemoglobin) 38.0 (H) 26.5 - 34.0 pg   MCHC (Mean Corpuscular Hemoglobin Concentration) 36.0 31.5 - 36.3 %   RBC (Red Blood Cell Count) 2.37 (L) 4.37 - 5.74 x1012/L   RDW-CV (Red Cell Distribution Width) 19.5 (H) 11.5 - 14.5 %   NRBC (Nucleated Red Blood Cell Count) 1.28 (H) 0 x109/L   NRBC % (Nucleated Red Blood Cell %) 6.3 %   MPV (Mean Platelet Volume) 9.6 7.2 - 11.7 fL   Slide Review/Morphology Yes    Immature Platelet Fraction 3.3 1.6 - 8.5 %  Comprehensive Metabolic Panel (CMP)   Collection Time: 05/17/24  8:04 PM  Result Value Ref Range   Sodium 138 135 - 145 mmol/L   Potassium 4.0 3.5 - 5.0 mmol/L   Chloride 102 98 - 108 mmol/L   Carbon Dioxide (CO2) 27 21 - 30 mmol/L   Urea Nitrogen (BUN) 7 7 - 20 mg/dL   Creatinine 1.0 0.6 - 1.3 mg/dL   Glucose 898 70 - 859 mg/dL   Calcium 9.1 8.7 - 89.7 mg/dL   AST (Aspartate Aminotransferase) 36 15 - 41 U/L   ALT (Alanine Aminotransferase) 24 15 - 50 U/L   Bilirubin, Total 1.8 (H) 0.4 - 1.5 mg/dL   Alk Phos (Alkaline Phosphatase) 79 24 - 110 U/L   Albumin 3.5 3.5 - 4.8 g/dL   Protein, Total 8.2 (H) 6.2 - 8.1 g/dL   Anion Gap 9 3 - 12 mmol/L   BUN/CREA Ratio 7 6 - 27   Glomerular Filtration Rate (eGFR)  101 mL/min/1.73sq m  Magnesium    Collection Time: 05/17/24  8:04 PM  Result Value Ref Range   Magnesium  2.1 1.8 - 2.5 mg/dL  Lipase   Collection Time:  05/17/24  8:04 PM  Result Value Ref Range   Lipase 27 17 - 51 U/L  Reticulocytes   Collection Time: 05/17/24  8:04 PM  Result Value Ref Range   Reticulocyte % 6.22 (H) 0.70 - 2.10 %   Reticulocyte Count /L 147.4 (H) 28.0 - 134.0 x10^9/L   Reticulocyte-He 35.0 29.2 - 38.8 pg   Immature Reticulocyte Fraction 24.9 (H) 3.1 - 16.0 %  Lactate Dehydrogenase (LDH)   Collection Time: 05/17/24  8:04 PM  Result Value Ref Range   Lactate Dehydrogenase (LDH) 443 (H) 100 - 200 U/L  Manual White Blood Cell (WBC) Differential   Collection Time: 05/17/24  8:04 PM  Result Value Ref Range   Segmented Neutrophil % 65 37 - 80 %   Band % 2 0 - 6 %   Lymphocyte % 20 10 - 50 %   Monocyte % 12 0 - 12 %   Myelocyte % 1 (H) <=0 %   NRBC (Nucleated Red Blood Cell Count) 10 PER 100/ WBC'S   Slide Review/Morphology Yes    Neutrophil Count, Absolute 13.26 (H) 2.00 - 8.60 x109/L   Band Count, Absolute 0.41 x109/L   Total Absolute Neutrophil Count (Segs + Bands) 13.67 (H) 2.00 - 8.60 x109/L   Lymphocyte Count, Absolute 4.08 0.60 - 4.20 x109/L   Monocyte Count, Absolute 2.45 (H) 0.00 - 0.90 x109/L   Myelocyte Count, Absolute 0.20 (H) <=0.00 x109/L   NRBC, Absolute 2.04 (H) <=0.00 x109/L    EKG: ordered new for QT assess Results for orders placed or performed during the hospital encounter of 03/22/24  ECG 12-lead   Collection Time: 03/22/24  4:00 PM  Result Value Ref Range   Vent Rate (bpm) 111    PR Interval (msec) 138    QRS Interval (msec) 74    QT Interval (msec) 346    QTc (msec) 470      Radiology Studies on Admission:  Wet Read <redacted file path>  cxr pa/l  von Almarie Burnard Garre, MD on 05/18/2024  1:39 AM EST [facility] Radiology Virtual, Radiology)  The bilateral lungs are clear. No pneumothorax or pleural effusion.  Follow up in the a.m. for the final report.    Wet Read <redacted file path> CT a+p  Karry Vaughn Pear, MD on 05/18/2024  1:11 AM EST [facility] Radiology Virtual,  Radiology)  No acute abnormalities in the abdomen or pelvis.   Assessment & Plan  Manuel Mcguire is a 33 y.o. male admitted for the following problems: Principal Problem:   Sickle cell pain crisis (CMS/HHS-HCC) Active Problems:   Hemoglobin SS disease    Cyclic vomiting syndrome   Adrenal insufficiency (HHS-HCC)    # Sickle cell disease with acute uncomplicated pain crisis Acute pain crisis likely due to dehydration and vomiting with inability to take and hold down much meds or p.o. Pain in lower back, typical for sickle cell. No respiratory distress or fever. States that previous crises resolve in about four days. Pain improving somewhat now on PCA in ED, 8/10 baseline pain 2/10..   - Hemoglobin at baseline 9, no indication for transfusion at this time. - Continue hydromorphone  PCA 0.3 mg bolus 1.8 mg/h limit, no continuous, 0.6 mg clinician bolus - 3 times daily scheduled acetaminophen  975 mg - Avoiding NSAIDs per last sickle cell rounding team note - Continue folic acid  1 mg daily - Continue hydroxyurea  1500 mg daily - Incentive spirometry - Gentle hydration x  10 hours, reassess thereafter - Holding lisinopril  2.5 mg which is for proteinuria associated sickle cell while not taking good p.o., monitor renal function on recheck and will consider restart - Trend CBC daily and reticulocyte, LDH acuity - Enoxaparin  DVT prophylaxis with SCDs - May benefit from care plan given at least 10 admissions here elsewhere this year  # Adrenal insufficiency Chronic adrenal insufficiency exacerbated by vomiting and difficulty with oral hydrocortisone . Discussed potential for dehydration and sick day dosing. IV hydrocortisone  beneficial during severe episodes. Labs and VS reassuring at this time. - Home dose of hydrocortisone  is 20 mg every morning 10 mg q. afternoon - 50mg  x1 IV in ED then transition to sick day dosing of hydrocortisone : 40 mg in the morning and 20 mg in the afternoon for 2-3  days  # Cyclic vomiting syndrome States initial improvement on nightly amitriptyline 25 mg, likely time to uptitrate - Continue amitriptyline 50 mg nightly assess for effect - As needed Zofran  and Compazine , consider scheduled Reglan , consider as needed Zyprexa - Continue PPI 40 mg daily - Check EKG for QTc    Comorbid Conditions: Hematologic Disorders:    Anemia:  Anemia present with lowest hemoglobin of 9.  Will continue to monitor.       VTE Prophylaxis  + Anticoagulant & Anti-Embolism Compression Device Ordered  enoxaparin , 40 mg, Subcutaneous, Q24H, 40 mg at 05/18/24 0418       Code Status: Full Code  Patient Class & Status: Inpatient, Intermediate  Discharge Planning: Current Ambulatory Status: walks Anticipated Discharge Needs: Manuel Mcguire Anticipated Discharge Needs: Home    LYNWOOD TAFT BARRIO, MD  [facility] 05/18/2024 6:26 AM

## 2024-05-19 DIAGNOSIS — R1115 Cyclical vomiting syndrome unrelated to migraine: Secondary | ICD-10-CM | POA: Diagnosis not present

## 2024-05-19 DIAGNOSIS — E2740 Unspecified adrenocortical insufficiency: Secondary | ICD-10-CM | POA: Diagnosis not present

## 2024-05-20 DIAGNOSIS — R1115 Cyclical vomiting syndrome unrelated to migraine: Secondary | ICD-10-CM | POA: Diagnosis not present

## 2024-05-20 DIAGNOSIS — E2740 Unspecified adrenocortical insufficiency: Secondary | ICD-10-CM | POA: Diagnosis not present

## 2024-05-20 DIAGNOSIS — D5700 Hb-SS disease with crisis, unspecified: Secondary | ICD-10-CM | POA: Diagnosis not present

## 2024-05-21 DIAGNOSIS — E2740 Unspecified adrenocortical insufficiency: Secondary | ICD-10-CM | POA: Diagnosis not present

## 2024-05-21 DIAGNOSIS — R1115 Cyclical vomiting syndrome unrelated to migraine: Secondary | ICD-10-CM | POA: Diagnosis not present

## 2024-05-21 DIAGNOSIS — D5700 Hb-SS disease with crisis, unspecified: Secondary | ICD-10-CM | POA: Diagnosis not present

## 2024-05-21 NOTE — Discharge Summary (Addendum)
 Discharge to Outside Facility: Case Manager  Manuel Mcguire, Sedita  Birth Date:  [birth year] Age:  33 y.o. Sex:  male Gender:  male  Admit Date/Time:  05/17/2024  6:48 PM  Rationale For Transfer: {Rationale for Transfer:36365}  Planning For Transfer    Discharge Date:  05/21/2024    Discharge Disposition:       Method of Transport: private vehicle     Receiving Facility:                   Person Accepting:                    Report called to Charge or care RN at:  ***                 Accepting Physician:                Referring Provider Pager #:       Provider Role Specialty Pager   * Donal, Josefa Lenis, MD Attending Internal Medicine * [phone]          Records Sent {Records Dzwu:[id number]}  Signed By: KATHERN SORREL  *Some images could not be shown.

## 2024-05-22 ENCOUNTER — Other Ambulatory Visit: Payer: Self-pay

## 2024-05-22 ENCOUNTER — Emergency Department (HOSPITAL_BASED_OUTPATIENT_CLINIC_OR_DEPARTMENT_OTHER)
Admission: EM | Admit: 2024-05-22 | Discharge: 2024-05-23 | Disposition: A | Attending: Emergency Medicine | Admitting: Emergency Medicine

## 2024-05-22 ENCOUNTER — Encounter (HOSPITAL_BASED_OUTPATIENT_CLINIC_OR_DEPARTMENT_OTHER): Payer: Self-pay

## 2024-05-22 DIAGNOSIS — D5700 Hb-SS disease with crisis, unspecified: Secondary | ICD-10-CM | POA: Diagnosis not present

## 2024-05-22 DIAGNOSIS — R000 Tachycardia, unspecified: Secondary | ICD-10-CM | POA: Diagnosis not present

## 2024-05-22 DIAGNOSIS — R112 Nausea with vomiting, unspecified: Secondary | ICD-10-CM | POA: Diagnosis not present

## 2024-05-22 DIAGNOSIS — D72829 Elevated white blood cell count, unspecified: Secondary | ICD-10-CM | POA: Diagnosis not present

## 2024-05-22 DIAGNOSIS — R1084 Generalized abdominal pain: Secondary | ICD-10-CM | POA: Diagnosis not present

## 2024-05-22 DIAGNOSIS — R1115 Cyclical vomiting syndrome unrelated to migraine: Secondary | ICD-10-CM | POA: Diagnosis not present

## 2024-05-22 DIAGNOSIS — R197 Diarrhea, unspecified: Secondary | ICD-10-CM | POA: Diagnosis not present

## 2024-05-22 LAB — COMPREHENSIVE METABOLIC PANEL WITH GFR
ALT: 31 U/L (ref 0–44)
AST: 36 U/L (ref 15–41)
Albumin: 4.7 g/dL (ref 3.5–5.0)
Alkaline Phosphatase: 101 U/L (ref 38–126)
Anion gap: 14 (ref 5–15)
BUN: 8 mg/dL (ref 6–20)
CO2: 26 mmol/L (ref 22–32)
Calcium: 10 mg/dL (ref 8.9–10.3)
Chloride: 102 mmol/L (ref 98–111)
Creatinine, Ser: 0.98 mg/dL (ref 0.61–1.24)
GFR, Estimated: >60 mL/min (ref >60)
Glucose, Bld: 109 mg/dL — ABNORMAL HIGH (ref 70–99)
Potassium: 3.7 mmol/L (ref 3.5–5.1)
Sodium: 142 mmol/L (ref 135–145)
Total Bilirubin: 1.7 mg/dL — ABNORMAL HIGH (ref 0.0–1.2)
Total Protein: 8.9 g/dL — ABNORMAL HIGH (ref 6.5–8.1)

## 2024-05-22 LAB — CBC
HCT: 27.6 % — ABNORMAL LOW (ref 39.0–52.0)
Hemoglobin: 9.9 g/dL — ABNORMAL LOW (ref 13.0–17.0)
MCH: 37.2 pg — ABNORMAL HIGH (ref 26.0–34.0)
MCHC: 35.9 g/dL (ref 30.0–36.0)
MCV: 103.8 fL — ABNORMAL HIGH (ref 80.0–100.0)
Platelets: 509 K/uL — ABNORMAL HIGH (ref 150–400)
RBC: 2.66 MIL/uL — ABNORMAL LOW (ref 4.22–5.81)
RDW: 17.6 % — ABNORMAL HIGH (ref 11.5–15.5)
WBC: 18.3 K/uL — ABNORMAL HIGH (ref 4.0–10.5)
nRBC: 1 % — ABNORMAL HIGH (ref 0.0–0.2)

## 2024-05-22 LAB — RETICULOCYTES
Immature Retic Fract: 41.9 % — ABNORMAL HIGH (ref 2.3–15.9)
RBC.: 2.62 MIL/uL — ABNORMAL LOW (ref 4.22–5.81)
Retic Count, Absolute: 197.5 K/uL — ABNORMAL HIGH (ref 19.0–186.0)
Retic Ct Pct: 7.5 % — ABNORMAL HIGH (ref 0.4–3.1)

## 2024-05-22 LAB — LIPASE, BLOOD: Lipase: 23 U/L (ref 11–51)

## 2024-05-22 MED ORDER — ONDANSETRON HCL 4 MG/2ML IJ SOLN
4.0000 mg | Freq: Once | INTRAMUSCULAR | Status: AC
  Administered 2024-05-22: 21:00:00 4 mg via INTRAVENOUS
  Filled 2024-05-22: qty 2

## 2024-05-22 MED ORDER — HYDROMORPHONE HCL 1 MG/ML IJ SOLN
1.0000 mg | Freq: Once | INTRAMUSCULAR | Status: AC
  Administered 2024-05-22: 21:00:00 1 mg via INTRAVENOUS
  Filled 2024-05-22: qty 1

## 2024-05-22 MED ORDER — DEXTROSE 5 % IV SOLN: 10.0000 mg/kg | Freq: Once | INTRAVENOUS | Status: DC

## 2024-05-22 MED ORDER — LACTATED RINGERS IV SOLN: INTRAVENOUS | Status: DC

## 2024-05-22 MED ORDER — SODIUM CHLORIDE 0.9 % IV SOLN: 2.0000 g | Freq: Once | INTRAVENOUS | Status: DC

## 2024-05-22 MED ORDER — DEXAMETHASONE SOD PHOSPHATE PF 10 MG/ML IJ SOLN: 10.0000 mg | Freq: Once | INTRAMUSCULAR | Status: DC

## 2024-05-22 MED ORDER — HYDROMORPHONE HCL 1 MG/ML IJ SOLN
1.0000 mg | Freq: Once | INTRAMUSCULAR | Status: DC
  Filled 2024-05-22: qty 1

## 2024-05-22 MED ORDER — SODIUM CHLORIDE 0.9 % IV BOLUS
1000.0000 mL | Freq: Once | INTRAVENOUS | Status: AC
  Administered 2024-05-22: 21:00:00 1000 mL via INTRAVENOUS

## 2024-05-22 MED ORDER — VANCOMYCIN HCL IN DEXTROSE 1-5 GM/200ML-% IV SOLN: 1000.0000 mg | Freq: Once | INTRAVENOUS | Status: DC

## 2024-05-22 MED ORDER — HYDROMORPHONE HCL 1 MG/ML IJ SOLN
1.0000 mg | Freq: Once | INTRAMUSCULAR | Status: AC
  Administered 2024-05-22: 23:00:00 1 mg via INTRAVENOUS

## 2024-05-22 MED ORDER — HYDROCORTISONE SOD SUC (PF) 100 MG IJ SOLR
100.0000 mg | Freq: Once | INTRAMUSCULAR | Status: AC
  Administered 2024-05-22: 23:00:00 100 mg via INTRAVENOUS
  Filled 2024-05-22: qty 2

## 2024-05-22 NOTE — ED Notes (Signed)
 Lab notified of reticulocytes add-on.

## 2024-05-22 NOTE — ED Triage Notes (Signed)
 Pt arrives with c/o sickle cell pain crisis that started 2 days ago. Pt reports pain in his back. Pt also reports ABD pain and n/v/d that started 2 days. Pt denies CP or SOB.

## 2024-05-22 NOTE — ED Provider Notes (Signed)
 [place] [facility] Provider Note   CSN: [id number] Arrival date & time: 05/22/24  1925     Patient presents with: Sickle Cell Pain Crisis and Emesis   Manuel Mcguire is a 33 y.o. male.   Patient is a 33 year old male with a history of sickle cell anemia, cyclical vomiting syndrome and recently diagnosed adrenal insufficiency who presents with nausea vomiting and back pain.  He says he has had recurrent episodes of nausea and vomiting sometimes associated with diarrhea.  He says has been worse over the last couple months.  Looks like he was recently admitted to [facility] and discharged yesterday.  He is having a recurrence of the nausea and vomiting and is unable to keep down his medications which he thinks is making his sickle cell pain hurt worse.  He has pain in his low back which is typical for him.  He also has some abdominal pain which he typically gets with these vomiting episodes.  He denies any different or unusual type of abdominal pain.  He is also not able to take his steroids due to the vomiting.  He denies any known fevers.  No urinary symptoms.       Prior to Admission medications  Medication Sig Start Date End Date Taking? Authorizing Provider  amitriptyline (ELAVIL) 25 MG tablet Take 25 mg by mouth at bedtime.    [provider]  CALCIUM PO Take 1 tablet by mouth daily.    [provider]  cholecalciferol  (VITAMIN D3) 25 MCG (1000 UNIT) tablet Take 1,000 Units by mouth daily.    [provider]  folic acid  (FOLVITE ) 1 MG tablet Take 1 tablet (1 mg total) by mouth daily. 10/27/12   Alvia Rosaline LABOR, MD  hydroxyurea  (HYDREA ) 500 MG capsule Take 3 capsules (1,500 mg total) by mouth daily. May take with food to minimize GI side effects. 10/27/12   Alvia Rosaline LABOR, MD  lisinopril  (ZESTRIL ) 2.5 MG tablet Take 2.5 mg by mouth daily.    [provider]  metoCLOPramide  (REGLAN ) 10 MG tablet Take 1  tablet (10 mg total) by mouth 3 (three) times daily with meals. Patient taking differently: Take 10 mg by mouth 3 (three) times daily as needed for nausea or vomiting (take with food). 12/02/23 12/01/24  Ijaola, Onyeje M, NP  ondansetron  (ZOFRAN -ODT) 4 MG disintegrating tablet Take 1 tablet (4 mg total) by mouth every 8 (eight) hours as needed for nausea or vomiting. 04/25/24   Desiderio Chew, PA-C  oxyCODONE  (OXY IR/ROXICODONE ) 5 MG immediate release tablet Take 1 tablet (5 mg total) by mouth every 6 (six) hours as needed for severe pain (pain score 7-10). Patient taking differently: Take 5-10 mg by mouth every 4 (four) hours as needed for moderate pain (pain score 4-6) or severe pain (pain score 7-10). 12/02/23   Ijaola, Onyeje M, NP  pantoprazole (PROTONIX) 40 MG tablet Take 40 mg by mouth daily before breakfast.    [provider]  promethazine  (PHENERGAN ) 25 MG suppository Place 1 suppository (25 mg total) rectally every 6 (six) hours as needed for nausea or vomiting. 05/14/24   Trine Raynell Moder, MD  promethazine  (PHENERGAN ) 25 MG suppository Place 1 suppository (25 mg total) rectally every 6 (six) hours as needed for nausea or vomiting. 05/16/24   Keith, Kayla N, PA-C  pseudoephedrine (SUDAFED) 120 MG 12 hr tablet Take 120 mg by mouth every 12 (twelve) hours as needed for congestion.    [provider]    Allergies: Patient has no known allergies.    Review of Systems  Constitutional:  Negative for chills, diaphoresis, fatigue and fever.  HENT:  Negative for congestion, rhinorrhea and sneezing.   Eyes: Negative.   Respiratory:  Negative for cough, chest tightness and shortness of breath.   Cardiovascular:  Negative for chest pain and leg swelling.  Gastrointestinal:  Positive for abdominal pain, diarrhea, nausea and vomiting. Negative for blood in stool.  Genitourinary:  Negative for difficulty urinating, flank pain and frequency.  Musculoskeletal:  Positive for back  pain. Negative for arthralgias.  Skin:  Negative for rash.  Neurological:  Negative for dizziness, speech difficulty, weakness, numbness and headaches.    Updated Vital Signs BP (!) 130/91 (BP Location: Right Arm)   Pulse (!) 106   Temp 98.7 F (37.1 C) (Oral)   Resp 20   Wt 77.1 kg   SpO2 100%   BMI 26.63 kg/m   Physical Exam Constitutional:      Appearance: He is well-developed.  HENT:     Head: Normocephalic and atraumatic.  Eyes:     Pupils: Pupils are equal, round, and reactive to light.  Cardiovascular:     Rate and Rhythm: Regular rhythm. Tachycardia present.     Heart sounds: Normal heart sounds.  Pulmonary:     Effort: Pulmonary effort is normal. No respiratory distress.     Breath sounds: Normal breath sounds. No wheezing or rales.  Chest:     Chest wall: No tenderness.  Abdominal:     General: Bowel sounds are normal.     Palpations: Abdomen is soft.     Tenderness: There is abdominal tenderness (Generalized abdominal tenderness). There is no guarding or rebound.  Musculoskeletal:        General: Normal range of motion.     Cervical back: Normal range of motion and neck supple.     Comments: No visible joint swelling  Lymphadenopathy:     Cervical: No cervical adenopathy.  Skin:    General: Skin is warm and dry.     Findings: No rash.  Neurological:     Mental Status: He is alert and oriented to person, place, and time.     (all labs ordered are listed, but only abnormal results are displayed) Labs Reviewed  COMPREHENSIVE METABOLIC PANEL WITH GFR - Abnormal; Notable for the following components:      Result Value   Glucose, Bld 109 (*)    Total Protein 8.9 (*)    Total Bilirubin 1.7 (*)    All other components within normal limits  CBC - Abnormal; Notable for the following components:   WBC 18.3 (*)    RBC 2.66 (*)    Hemoglobin 9.9 (*)    HCT 27.6 (*)    MCV 103.8 (*)    MCH 37.2 (*)    RDW 17.6 (*)    Platelets 509 (*)    nRBC 1.0 (*)     All other components within normal limits  RETICULOCYTES - Abnormal; Notable for the following components:   Retic Ct Pct 7.5 (*)    RBC. 2.62 (*)    Retic Count, Absolute 197.5 (*)    Immature Retic Fract 41.9 (*)    All other components within normal limits  LIPASE, BLOOD  URINALYSIS, ROUTINE W REFLEX MICROSCOPIC    EKG: None  Radiology: No results found.   Procedures   Medications Ordered in the ED  lactated ringers  infusion (has  no administration in time range)  sodium chloride  0.9 % bolus 1,000 mL (0 mLs Intravenous Stopped 05/22/24 2233)  ondansetron  (ZOFRAN ) injection 4 mg (4 mg Intravenous Given 05/22/24 2118)  HYDROmorphone  (DILAUDID ) injection 1 mg (1 mg Intravenous Given 05/22/24 2117)  hydrocortisone  sodium succinate  (SOLU-CORTEF ) 100 MG injection 100 mg (100 mg Intravenous Given 05/22/24 2234)  HYDROmorphone  (DILAUDID ) injection 1 mg (1 mg Intravenous Given 05/22/24 2250)                                    Medical Decision Making Amount and/or Complexity of Data Reviewed Labs: ordered.  Risk Prescription drug management.   This patient presents to the ED for concern of nausea and vomiting, sickle cell pain, this involves an extensive number of treatment options, and is a complaint that carries with it a high risk of complications and morbidity.  I considered the following differential and admission for this acute, potentially life threatening condition.  The differential diagnosis includes gastroenteritis, sickle cell pain crisis, aplastic crisis, infection, bowel obstruction, colitis  MDM:    Patient is a 33 year old who presents with nausea vomiting diarrhea.  He has a history of adrenal insufficiency and sickle cell disease as well as cyclical vomiting syndrome.  He says he has been dealing with this for several months.  He does have associated abdominal pain but says it is similar to his prior symptoms.  I do not feel at this point that a CT scan is  indicated.  Labs show an elevated WBC count which he has had in the past fairly consistently.  He is not febrile.  He has some baseline anemia.  LFTs are not elevated, lipase is normal.  He was started on IV fluids as well as pain medication and antiemetics and was also given a dose of hydrocortisone .  Patient care turned over to Dr. Griselda pending reassessment after treatment.  (Labs, imaging, consults)  Labs: I Ordered, and personally interpreted labs.  The pertinent results include: Elevated WBC count, normal lipase, no concerns for aplastic crisis  Imaging Studies ordered: I ordered imaging studies including   I independently visualized and interpreted imaging. I agree with the radiologist interpretation  Additional history obtained from father at bedside.  External records from outside source obtained and reviewed including history  Cardiac Monitoring: The patient was maintained on a cardiac monitor.  If on the cardiac monitor, I personally viewed and interpreted the cardiac monitored which showed an underlying rhythm of: Sinus tachycardia  Reevaluation: After the interventions noted above, I reevaluated the patient and found that they have :improved  Social Determinants of Health:    Disposition: Pending  Co morbidities that complicate the patient evaluation  Past Medical History:  Diagnosis Date   Scoliosis    Sickle cell anemia with pain (HCC)      Medicines Meds ordered this encounter  Medications   sodium chloride  0.9 % bolus 1,000 mL   ondansetron  (ZOFRAN ) injection 4 mg   HYDROmorphone  (DILAUDID ) injection 1 mg   hydrocortisone  sodium succinate  (SOLU-CORTEF ) 100 MG injection 100 mg   DISCONTD: HYDROmorphone  (DILAUDID ) injection 1 mg   lactated ringers  infusion   DISCONTD: dexamethasone  (DECADRON ) injection 10 mg   DISCONTD: cefTRIAXone  (ROCEPHIN ) 2 g in sodium chloride  0.9 % 100 mL IVPB    Antibiotic Indication::   Meningitis   DISCONTD: vancomycin  (VANCOCIN )  IVPB 1000 mg/200 mL premix    Indication::  Other Indication (list below)    Other Indication::   Meningitis   DISCONTD: acyclovir (ZOVIRAX) 770 mg in dextrose  5 % 250 mL IVPB   HYDROmorphone  (DILAUDID ) injection 1 mg    I have reviewed the patients home medicines and have made adjustments as needed  Problem List / ED Course: Problem List Items Addressed This Visit       Digestive   Nausea vomiting and diarrhea - Primary     Other   Sickle cell pain crisis (HCC)             Final diagnoses:  Nausea vomiting and diarrhea  Sickle cell pain crisis [facility])    ED Discharge Orders     None          Lenor Hollering, MD 05/22/24 2305

## 2024-05-22 NOTE — ED Notes (Signed)
 Tried sticking the patient three times and couldn't get blood

## 2024-05-23 DIAGNOSIS — R000 Tachycardia, unspecified: Secondary | ICD-10-CM | POA: Diagnosis not present

## 2024-05-23 LAB — URINALYSIS, ROUTINE W REFLEX MICROSCOPIC
Bilirubin Urine: NEGATIVE
Glucose, UA: NEGATIVE mg/dL
Hgb urine dipstick: NEGATIVE
Ketones, ur: NEGATIVE mg/dL
Leukocytes,Ua: NEGATIVE
Nitrite: NEGATIVE
Protein, ur: >=300 mg/dL — AB
Specific Gravity, Urine: 1.025 (ref 1.005–1.030)
pH: 6 (ref 5.0–8.0)

## 2024-05-23 LAB — URINALYSIS, MICROSCOPIC (REFLEX)

## 2024-05-23 MED ORDER — HYDROMORPHONE HCL 1 MG/ML IJ SOLN
1.0000 mg | Freq: Once | INTRAMUSCULAR | Status: AC
  Administered 2024-05-23: 02:00:00 1 mg via INTRAVENOUS
  Filled 2024-05-23: qty 1

## 2024-05-23 MED ORDER — ONDANSETRON 4 MG PO TBDP: 4.0000 mg | ORAL_TABLET | Freq: Three times a day (TID) | ORAL | 0 refills | Status: DC | PRN

## 2024-05-23 MED ORDER — ONDANSETRON HCL 4 MG/2ML IJ SOLN
4.0000 mg | Freq: Once | INTRAMUSCULAR | Status: AC
  Administered 2024-05-23: 02:00:00 4 mg via INTRAVENOUS
  Filled 2024-05-23: qty 2

## 2024-05-23 NOTE — ED Notes (Signed)
 Pt has tolerated fluids well, requesting more Zofran  and Dilaudid , EDP made aware

## 2024-05-24 DIAGNOSIS — R112 Nausea with vomiting, unspecified: Secondary | ICD-10-CM | POA: Diagnosis not present

## 2024-05-24 DIAGNOSIS — R000 Tachycardia, unspecified: Secondary | ICD-10-CM | POA: Diagnosis not present

## 2024-05-24 DIAGNOSIS — R1115 Cyclical vomiting syndrome unrelated to migraine: Secondary | ICD-10-CM | POA: Diagnosis not present

## 2024-05-25 DIAGNOSIS — E2740 Unspecified adrenocortical insufficiency: Secondary | ICD-10-CM | POA: Diagnosis not present

## 2024-05-25 DIAGNOSIS — R1115 Cyclical vomiting syndrome unrelated to migraine: Secondary | ICD-10-CM | POA: Diagnosis not present

## 2024-05-26 DIAGNOSIS — R1115 Cyclical vomiting syndrome unrelated to migraine: Secondary | ICD-10-CM | POA: Diagnosis not present

## 2024-05-27 DIAGNOSIS — D5700 Hb-SS disease with crisis, unspecified: Secondary | ICD-10-CM | POA: Diagnosis not present

## 2024-05-27 DIAGNOSIS — R1115 Cyclical vomiting syndrome unrelated to migraine: Secondary | ICD-10-CM | POA: Diagnosis not present

## 2024-06-29 ENCOUNTER — Telehealth: Payer: Self-pay | Admitting: *Deleted

## 2024-06-29 DIAGNOSIS — D5700 Hb-SS disease with crisis, unspecified: Secondary | ICD-10-CM

## 2024-06-29 NOTE — Progress Notes (Signed)
 Complex Care Management Note Care Guide Note  06/29/2024 Name: Manuel Mcguire MRN: [id number] DOB: 02-Aug-1990   Complex Care Management Outreach Attempts: An unsuccessful telephone outreach was attempted today to offer the patient information about available complex care management services.  Follow Up Plan:  Additional outreach attempts will be made to offer the patient complex care management information and services.   Encounter Outcome:  No Answer Harlene Satterfield  [facility]  [facility], [facility] Guide  Direct Dial: [phone]  Fax [phone]

## 2024-06-30 ENCOUNTER — Encounter (HOSPITAL_BASED_OUTPATIENT_CLINIC_OR_DEPARTMENT_OTHER): Payer: Self-pay | Admitting: Emergency Medicine

## 2024-06-30 ENCOUNTER — Emergency Department (HOSPITAL_BASED_OUTPATIENT_CLINIC_OR_DEPARTMENT_OTHER)

## 2024-06-30 ENCOUNTER — Other Ambulatory Visit: Payer: Self-pay

## 2024-06-30 ENCOUNTER — Inpatient Hospital Stay (HOSPITAL_BASED_OUTPATIENT_CLINIC_OR_DEPARTMENT_OTHER)
Admission: EM | Admit: 2024-06-30 | Discharge: 2024-07-04 | DRG: 812 | Disposition: A | Discharge status: Home / Self Care | Attending: Internal Medicine | Admitting: Internal Medicine

## 2024-06-30 DIAGNOSIS — Z79899 Other long term (current) drug therapy: Secondary | ICD-10-CM

## 2024-06-30 DIAGNOSIS — D696 Thrombocytopenia, unspecified: Secondary | ICD-10-CM | POA: Diagnosis present

## 2024-06-30 DIAGNOSIS — K219 Gastro-esophageal reflux disease without esophagitis: Secondary | ICD-10-CM | POA: Diagnosis present

## 2024-06-30 DIAGNOSIS — D638 Anemia in other chronic diseases classified elsewhere: Secondary | ICD-10-CM | POA: Diagnosis not present

## 2024-06-30 DIAGNOSIS — Z832 Family history of diseases of the blood and blood-forming organs and certain disorders involving the immune mechanism: Secondary | ICD-10-CM

## 2024-06-30 DIAGNOSIS — D5700 Hb-SS disease with crisis, unspecified: Principal | ICD-10-CM | POA: Diagnosis present

## 2024-06-30 DIAGNOSIS — N179 Acute kidney failure, unspecified: Secondary | ICD-10-CM | POA: Diagnosis present

## 2024-06-30 DIAGNOSIS — R1115 Cyclical vomiting syndrome unrelated to migraine: Secondary | ICD-10-CM | POA: Diagnosis present

## 2024-06-30 DIAGNOSIS — D571 Sickle-cell disease without crisis: Secondary | ICD-10-CM

## 2024-06-30 DIAGNOSIS — Z8249 Family history of ischemic heart disease and other diseases of the circulatory system: Secondary | ICD-10-CM

## 2024-06-30 DIAGNOSIS — G894 Chronic pain syndrome: Secondary | ICD-10-CM | POA: Diagnosis present

## 2024-06-30 DIAGNOSIS — E860 Dehydration: Secondary | ICD-10-CM | POA: Diagnosis present

## 2024-06-30 DIAGNOSIS — J811 Chronic pulmonary edema: Secondary | ICD-10-CM | POA: Diagnosis present

## 2024-06-30 DIAGNOSIS — D649 Anemia, unspecified: Secondary | ICD-10-CM

## 2024-06-30 DIAGNOSIS — Z833 Family history of diabetes mellitus: Secondary | ICD-10-CM

## 2024-06-30 DIAGNOSIS — M549 Dorsalgia, unspecified: Secondary | ICD-10-CM | POA: Diagnosis present

## 2024-06-30 DIAGNOSIS — Z9049 Acquired absence of other specified parts of digestive tract: Secondary | ICD-10-CM

## 2024-06-30 LAB — CBC WITH DIFFERENTIAL/PLATELET
Abs Immature Granulocytes: 0.21 10*3/uL — ABNORMAL HIGH (ref 0.00–0.07)
Basophils Absolute: 0 10*3/uL (ref 0.0–0.1)
Basophils Relative: 0 %
Eosinophils Absolute: 0.2 10*3/uL (ref 0.0–0.5)
Eosinophils Relative: 1 %
HCT: 17.5 % — ABNORMAL LOW (ref 39.0–52.0)
Hemoglobin: 6.3 g/dL — CL (ref 13.0–17.0)
Immature Granulocytes: 1 %
Lymphocytes Relative: 12 %
Lymphs Abs: 2.1 10*3/uL (ref 0.7–4.0)
MCH: 39.9 pg — ABNORMAL HIGH (ref 26.0–34.0)
MCHC: 36 g/dL (ref 30.0–36.0)
MCV: 110.8 fL — ABNORMAL HIGH (ref 80.0–100.0)
Monocytes Absolute: 2.6 10*3/uL — ABNORMAL HIGH (ref 0.1–1.0)
Monocytes Relative: 15 %
Neutro Abs: 12.6 10*3/uL — ABNORMAL HIGH (ref 1.7–7.7)
Neutrophils Relative %: 71 %
Platelets: 120 10*3/uL — ABNORMAL LOW (ref 150–400)
RBC: 1.58 MIL/uL — ABNORMAL LOW (ref 4.22–5.81)
RDW: 21.5 % — ABNORMAL HIGH (ref 11.5–15.5)
Smear Review: NORMAL
WBC: 17.8 10*3/uL — ABNORMAL HIGH (ref 4.0–10.5)
nRBC: 14.8 % — ABNORMAL HIGH (ref 0.0–0.2)

## 2024-06-30 LAB — BASIC METABOLIC PANEL WITH GFR
Anion gap: 12 (ref 5–15)
BUN: 20 mg/dL (ref 6–20)
CO2: 23 mmol/L (ref 22–32)
Calcium: 8.9 mg/dL (ref 8.9–10.3)
Chloride: 107 mmol/L (ref 98–111)
Creatinine, Ser: 2.36 mg/dL — ABNORMAL HIGH (ref 0.61–1.24)
GFR, Estimated: 36 mL/min — ABNORMAL LOW
Glucose, Bld: 111 mg/dL — ABNORMAL HIGH (ref 70–99)
Potassium: 4.5 mmol/L (ref 3.5–5.1)
Sodium: 141 mmol/L (ref 135–145)

## 2024-06-30 MED ORDER — DIPHENHYDRAMINE HCL 25 MG PO CAPS
25.0000 mg | ORAL_CAPSULE | ORAL | Status: DC | PRN
  Administered 2024-07-03: 25 mg via ORAL
  Filled 2024-06-30: qty 1

## 2024-06-30 MED ORDER — ONDANSETRON HCL 4 MG PO TABS: 4.0000 mg | ORAL_TABLET | ORAL | Status: DC | PRN

## 2024-06-30 MED ORDER — SODIUM CHLORIDE 0.9 % IV BOLUS
1000.0000 mL | Freq: Once | INTRAVENOUS | Status: AC
  Administered 2024-06-30: 1000 mL via INTRAVENOUS

## 2024-06-30 MED ORDER — ONDANSETRON HCL 4 MG PO TABS: 4.0000 mg | ORAL_TABLET | Freq: Four times a day (QID) | ORAL | Status: DC | PRN

## 2024-06-30 MED ORDER — HYDROMORPHONE HCL 1 MG/ML IJ SOLN
2.0000 mg | INTRAMUSCULAR | Status: DC | PRN
  Filled 2024-06-30: qty 2

## 2024-06-30 MED ORDER — SODIUM CHLORIDE 0.9% FLUSH: 9.0000 mL | INTRAVENOUS | Status: DC | PRN

## 2024-06-30 MED ORDER — AMITRIPTYLINE HCL 25 MG PO TABS
100.0000 mg | ORAL_TABLET | Freq: Every day | ORAL | Status: DC
  Administered 2024-07-01 – 2024-07-03 (×4): 100 mg via ORAL
  Filled 2024-06-30 (×4): qty 4

## 2024-06-30 MED ORDER — FOLIC ACID 1 MG PO TABS
1.0000 mg | ORAL_TABLET | Freq: Every day | ORAL | Status: DC
  Administered 2024-07-01 – 2024-07-04 (×4): 1 mg via ORAL
  Filled 2024-06-30 (×4): qty 1

## 2024-06-30 MED ORDER — SODIUM CHLORIDE 0.9% IV SOLUTION: Freq: Once | INTRAVENOUS | Status: DC

## 2024-06-30 MED ORDER — DEXTROSE-SODIUM CHLORIDE 5-0.45 % IV SOLN: INTRAVENOUS | Status: DC

## 2024-06-30 MED ORDER — VITAMIN D 25 MCG (1000 UNIT) PO TABS
1000.0000 [IU] | ORAL_TABLET | Freq: Every day | ORAL | Status: DC
  Administered 2024-07-01 – 2024-07-04 (×4): 1000 [IU] via ORAL
  Filled 2024-06-30 (×3): qty 1

## 2024-06-30 MED ORDER — PANTOPRAZOLE SODIUM 40 MG PO TBEC
40.0000 mg | DELAYED_RELEASE_TABLET | Freq: Every day | ORAL | Status: DC
  Administered 2024-07-01 – 2024-07-04 (×4): 40 mg via ORAL
  Filled 2024-06-30 (×4): qty 1

## 2024-06-30 MED ORDER — KETOROLAC TROMETHAMINE 15 MG/ML IJ SOLN
15.0000 mg | Freq: Four times a day (QID) | INTRAMUSCULAR | Status: DC
  Administered 2024-07-01 – 2024-07-04 (×11): 15 mg via INTRAVENOUS
  Filled 2024-06-30 (×12): qty 1

## 2024-06-30 MED ORDER — KETOROLAC TROMETHAMINE 15 MG/ML IJ SOLN
15.0000 mg | Freq: Four times a day (QID) | INTRAMUSCULAR | Status: DC
  Administered 2024-06-30: 15 mg via INTRAVENOUS
  Filled 2024-06-30: qty 1

## 2024-06-30 MED ORDER — ONDANSETRON HCL 4 MG/2ML IJ SOLN
4.0000 mg | INTRAMUSCULAR | Status: DC | PRN
  Administered 2024-06-30: 4 mg via INTRAVENOUS
  Filled 2024-06-30: qty 2

## 2024-06-30 MED ORDER — ONDANSETRON HCL 4 MG/2ML IJ SOLN: 4.0000 mg | Freq: Four times a day (QID) | INTRAMUSCULAR | Status: DC | PRN

## 2024-06-30 MED ORDER — L-GLUTAMINE ORAL POWDER
15.0000 g | PACK | Freq: Two times a day (BID) | ORAL | Status: DC
  Filled 2024-06-30: qty 3

## 2024-06-30 MED ORDER — HYDROMORPHONE HCL 1 MG/ML IJ SOLN
2.0000 mg | Freq: Once | INTRAMUSCULAR | Status: AC
  Administered 2024-06-30: 2 mg via INTRAVENOUS

## 2024-06-30 MED ORDER — NALOXONE HCL 0.4 MG/ML IJ SOLN: 0.4000 mg | INTRAMUSCULAR | Status: DC | PRN

## 2024-06-30 MED ORDER — HYDROMORPHONE HCL 1 MG/ML IJ SOLN
2.0000 mg | INTRAMUSCULAR | Status: AC
  Administered 2024-06-30: 2 mg via INTRAVENOUS
  Filled 2024-06-30: qty 2

## 2024-06-30 MED ORDER — ENOXAPARIN SODIUM 40 MG/0.4ML IJ SOSY
40.0000 mg | PREFILLED_SYRINGE | INTRAMUSCULAR | Status: DC
  Administered 2024-07-01 – 2024-07-04 (×4): 40 mg via SUBCUTANEOUS
  Filled 2024-06-30 (×4): qty 0.4

## 2024-06-30 MED ORDER — HYDROXYUREA 500 MG PO CAPS
1500.0000 mg | ORAL_CAPSULE | Freq: Every day | ORAL | Status: DC
  Administered 2024-07-01 – 2024-07-04 (×4): 1500 mg via ORAL
  Filled 2024-06-30 (×4): qty 3

## 2024-06-30 MED ORDER — OXYCODONE HCL 5 MG PO TABS
5.0000 mg | ORAL_TABLET | ORAL | Status: DC | PRN
  Administered 2024-07-01 – 2024-07-03 (×9): 10 mg via ORAL
  Administered 2024-07-03: 5 mg via ORAL
  Administered 2024-07-03 (×2): 10 mg via ORAL
  Administered 2024-07-04: 5 mg via ORAL
  Filled 2024-06-30 (×10): qty 2
  Filled 2024-06-30: qty 1
  Filled 2024-06-30 (×4): qty 2

## 2024-06-30 MED ORDER — SODIUM CHLORIDE 0.9 % IV SOLN: INTRAVENOUS | Status: DC

## 2024-06-30 MED ORDER — HYDROMORPHONE 1 MG/ML IV SOLN
INTRAVENOUS | Status: DC
  Administered 2024-07-01: 4 mg via INTRAVENOUS
  Administered 2024-07-01: 0.5 mg via INTRAVENOUS
  Administered 2024-07-01 (×2): 30 mg via INTRAVENOUS
  Administered 2024-07-01: 7 mg via INTRAVENOUS
  Administered 2024-07-01: 4.5 mg via INTRAVENOUS
  Administered 2024-07-03: 3.5 mg via INTRAVENOUS
  Administered 2024-07-03: 5 mg via INTRAVENOUS
  Administered 2024-07-03: 1.5 mg via INTRAVENOUS
  Administered 2024-07-03: 30 mg via INTRAVENOUS
  Administered 2024-07-03: 6.5 mg via INTRAVENOUS
  Administered 2024-07-03: 6 mg via INTRAVENOUS
  Administered 2024-07-04: 3.5 mg via INTRAVENOUS
  Administered 2024-07-04: 0.5 mg via INTRAVENOUS
  Filled 2024-06-30 (×4): qty 30

## 2024-06-30 MED ORDER — SENNOSIDES-DOCUSATE SODIUM 8.6-50 MG PO TABS
1.0000 | ORAL_TABLET | Freq: Two times a day (BID) | ORAL | Status: DC
  Administered 2024-07-01 – 2024-07-04 (×8): 1 via ORAL
  Filled 2024-06-30 (×8): qty 1

## 2024-06-30 MED ORDER — POLYETHYLENE GLYCOL 3350 17 G PO PACK
17.0000 g | PACK | Freq: Every day | ORAL | Status: DC | PRN
  Administered 2024-07-02: 17 g via ORAL
  Filled 2024-06-30: qty 1

## 2024-06-30 MED ORDER — KETOROLAC TROMETHAMINE 15 MG/ML IJ SOLN
15.0000 mg | INTRAMUSCULAR | Status: AC
  Administered 2024-06-30: 15 mg via INTRAVENOUS
  Filled 2024-06-30: qty 1

## 2024-06-30 MED ORDER — SODIUM CHLORIDE 0.9 % IV SOLN: INTRAVENOUS | Status: AC

## 2024-06-30 NOTE — ED Provider Notes (Signed)
 " [place] [facility] Provider Note   CSN: [id number] Arrival date & time: 06/30/24  1754     Patient presents with: Sickle Cell Pain Crisis   Manuel Mcguire is a 34 y.o. male history of sickle cell [place], priapism, here presenting with back pain.  Patient has been having lower back pain for the last several days.  Patient is on oxycodone  and hydroxyurea  and has not been helping his pain.  Patient denies any chest pain or shortness of breath or fever.  Patient was last admitted at [facility] in December for sickle cell pain crisis   The history is provided by the patient.       Prior to Admission medications  Medication Sig Start Date End Date Taking? Authorizing Provider  amitriptyline  (ELAVIL ) 25 MG tablet Take 25 mg by mouth at bedtime.    [provider]  CALCIUM  PO Take 1 tablet by mouth daily.    [provider]  cholecalciferol  (VITAMIN D3) 25 MCG (1000 UNIT) tablet Take 1,000 Units by mouth daily.    [provider]  folic acid  (FOLVITE ) 1 MG tablet Take 1 tablet (1 mg total) by mouth daily. 10/27/12   Alvia Rosaline LABOR, MD  hydroxyurea  (HYDREA ) 500 MG capsule Take 3 capsules (1,500 mg total) by mouth daily. May take with food to minimize GI side effects. 10/27/12   Alvia Rosaline LABOR, MD  lisinopril  (ZESTRIL ) 2.5 MG tablet Take 2.5 mg by mouth daily.    [provider]  metoCLOPramide  (REGLAN ) 10 MG tablet Take 1 tablet (10 mg total) by mouth 3 (three) times daily with meals. Patient taking differently: Take 10 mg by mouth 3 (three) times daily as needed for nausea or vomiting (take with food). 12/02/23 12/01/24  Ijaola, Onyeje M, NP  ondansetron  (ZOFRAN -ODT) 4 MG disintegrating tablet Take 1 tablet (4 mg total) by mouth every 8 (eight) hours as needed for nausea or vomiting. 04/25/24   Geiple, Joshua, PA-C  ondansetron  (ZOFRAN -ODT) 4 MG disintegrating tablet Take 1 tablet (4 mg total) by mouth every 8 (eight)  hours as needed. 05/23/24   Griselda Norris, MD  oxyCODONE  (OXY IR/ROXICODONE ) 5 MG immediate release tablet Take 1 tablet (5 mg total) by mouth every 6 (six) hours as needed for severe pain (pain score 7-10). Patient taking differently: Take 5-10 mg by mouth every 4 (four) hours as needed for moderate pain (pain score 4-6) or severe pain (pain score 7-10). 12/02/23   Cherylene Homer HERO, NP  pantoprazole  (PROTONIX ) 40 MG tablet Take 40 mg by mouth daily before breakfast.    [provider]  promethazine  (PHENERGAN ) 25 MG suppository Place 1 suppository (25 mg total) rectally every 6 (six) hours as needed for nausea or vomiting. 05/14/24   Trine Raynell Moder, MD  promethazine  (PHENERGAN ) 25 MG suppository Place 1 suppository (25 mg total) rectally every 6 (six) hours as needed for nausea or vomiting. 05/16/24   Keith, Kayla N, PA-C  pseudoephedrine (SUDAFED) 120 MG 12 hr tablet Take 120 mg by mouth every 12 (twelve) hours as needed for congestion.    [provider]    Allergies: Patient has no known allergies.    Review of Systems  Musculoskeletal:  Positive for back pain.  All other systems reviewed and are negative.   Updated Vital Signs BP 118/80 (BP Location: Right Arm)   Pulse 95   Temp 97.9 F (36.6 C) (Oral)   Resp 17   SpO2 92%  Physical Exam Vitals and nursing note reviewed.  Constitutional:      Appearance: Normal appearance.  HENT:     Head: Normocephalic.     Nose: Nose normal.     Mouth/Throat:     Mouth: Mucous membranes are moist.  Eyes:     Extraocular Movements: Extraocular movements intact.     Pupils: Pupils are equal, round, and reactive to light.  Cardiovascular:     Rate and Rhythm: Normal rate and regular rhythm.     Pulses: Normal pulses.     Heart sounds: Normal heart sounds.  Pulmonary:     Effort: Pulmonary effort is normal.     Breath sounds: Normal breath sounds.  Abdominal:     General: Abdomen is flat.     Palpations:  Abdomen is soft.  Musculoskeletal:     Cervical back: Normal range of motion and neck supple.     Comments: Lower lumbar tenderness.  Normal range of motion bilateral hips and no obvious bony tenderness  Skin:    General: Skin is warm.     Capillary Refill: Capillary refill takes less than 2 seconds.  Neurological:     General: No focal deficit present.     Mental Status: He is alert and oriented to person, place, and time.  Psychiatric:        Mood and Affect: Mood normal.        Behavior: Behavior normal.     (all labs ordered are listed, but only abnormal results are displayed) Labs Reviewed  CBC WITH DIFFERENTIAL/PLATELET  RETICULOCYTES  BASIC METABOLIC PANEL WITH GFR    EKG: None  Radiology: No results found.   Procedures   Medications Ordered in the ED  dextrose  5 % and 0.45 % NaCl infusion ( Intravenous New Bag/Given 06/30/24 1853)  HYDROmorphone  (DILAUDID ) injection 2 mg (has no administration in time range)  HYDROmorphone  (DILAUDID ) injection 2 mg (has no administration in time range)  ketorolac  (TORADOL ) 15 MG/ML injection 15 mg (15 mg Intravenous Given 06/30/24 1851)  HYDROmorphone  (DILAUDID ) injection 2 mg (2 mg Intravenous Given 06/30/24 1849)                                    Medical Decision Making Manuel Mcguire is a 34 y.o. male here presenting with back pain.  Patient likely has sickle cell pain crisis.  Patient has no chest pain or shortness of breath to suggest acute chest.  Plan to get CBC and BMP reticulocyte count.  Will give IV Dilaudid  per sickle cell pain protocol as well as Toradol .   8:32 PM Hemoglobin is 6.3 and baseline is between 8 and 9.  Patient will need transfusion and IV pain I sent for pain control.  Hospitalist to admit to [facility]  Problems Addressed: Sickle cell disease with crisis [facility]): acute illness or injury  Amount and/or Complexity of Data Reviewed Labs: ordered. Decision-making details documented in ED  Course.  Risk Prescription drug management. Decision regarding hospitalization.     Final diagnoses:  None    ED Discharge Orders     None          Patt Alm Macho, MD 06/30/24 2037  "

## 2024-06-30 NOTE — H&P (Incomplete)
 " History and Physical  Manuel Mcguire:[id number] DOB: 1990/08/13 DOA: 06/30/2024  PCP: Manuel Karolynn Batters, NP   Chief Complaint: Sickle cell pain  HPI: Manuel Mcguire is a 34 y.o. 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 Patient reports he had a follow-up with his sickle cell team at [facility] on Thursday and was informed that his hemoglobin was down to 7.5 with a drop in his platelet. Over the last 2 days, he has had worsening back pain that has not improved with his home as needed pain medicine. He called his sickle cell provider and was advised to present to the ED for further evaluation. He denies any chest pain, cough, fever, chills, nausea, vomiting, abdominal pain, leg pain, bloody stools, dizziness, dysuria or headache.  [facility] ED Course: Initial vitals show patient afebrile and normotensive. Initial labs significant for creatinine 2.36, Hgb 6.3, WBC 17.8, platelet 120, reticulocyte count >30. CXR shows mild cardiomegaly and pulmonary edema but no active disease. Pt received IV Dilaudid  2 mg x 3 doses without significant relief. Patient was admitted to [facility] service and transferred to [facility].  Review of Systems: Please see HPI for pertinent positives and negatives. A complete 10 system review of systems are otherwise negative.  Past Medical History:  Diagnosis Date   Scoliosis    Sickle cell anemia with pain (HCC)    Past Surgical History:  Procedure Laterality Date   BACK SURGERY     unaware of particular surgery   CHOLECYSTECTOMY Mcguire/A 11/22/2023   Procedure: LAPAROSCOPIC CHOLECYSTECTOMY WITH INTRAOPERATIVE CHOLANGIOGRAM;  Surgeon: Manuel Mitzie LABOR, MD;  Location: [facility] ORS;  Service: General;  Laterality: Mcguire/A;  Poss IOC, ICG   HIP PINNING Left     Pt unaware of particulars   Social History:  reports that he has never smoked. He does not have any smokeless tobacco history on file. He reports that he does not drink alcohol and does not use drugs.  Allergies[1]  Family History  Problem Relation Age of Onset   Diabetes Mother    Hypertension Mother    Sickle cell anemia Sister      Prior to Admission medications  Medication Sig Start Date End Date Taking? Authorizing Provider  amitriptyline  (ELAVIL ) 25 MG tablet Take 25 mg by mouth at bedtime.    [provider]  CALCIUM  PO Take 1 tablet by mouth daily.    [provider]  cholecalciferol  (VITAMIN D3) 25 MCG (1000 UNIT) tablet Take 1,000 Units by mouth daily.    [provider]  folic acid  (FOLVITE ) 1 MG tablet Take 1 tablet (1 mg total) by mouth daily. 10/27/12   Manuel Rosaline LABOR, MD  hydroxyurea  (HYDREA ) 500 MG capsule Take 3 capsules (1,500 mg total) by mouth daily. May take with food to minimize GI side effects. 10/27/12   Manuel Rosaline LABOR, MD  lisinopril  (ZESTRIL ) 2.5 MG tablet Take 2.5 mg by mouth daily.    [provider]  metoCLOPramide  (REGLAN ) 10 MG tablet Take 1 tablet (10 mg total) by mouth 3 (three) times daily with meals. Patient taking differently: Take 10 mg by mouth 3 (three) times daily as needed for nausea or vomiting (take with food). 12/02/23 12/01/24  Manuel, Onyeje M, NP  ondansetron  (ZOFRAN -ODT) 4 MG disintegrating tablet Take 1 tablet (  4 mg total) by mouth every 8 (eight) hours as needed for nausea or vomiting. 04/25/24   Manuel Chew, PA-C  ondansetron  (ZOFRAN -ODT) 4 MG disintegrating tablet Take 1 tablet (4 mg total) by mouth every 8 (eight) hours as needed. 05/23/24   Manuel Norris, MD  oxyCODONE  (OXY IR/ROXICODONE ) 5 MG immediate release tablet Take 1 tablet (5 mg total) by mouth every 6 (six) hours as needed for severe pain (pain score 7-10). Patient taking differently: Take 5-10 mg by mouth every 4 (four) hours as  needed for moderate pain (pain score 4-6) or severe pain (pain score 7-10). 12/02/23   Manuel Homer HERO, NP  pantoprazole  (PROTONIX ) 40 MG tablet Take 40 mg by mouth daily before breakfast.    [provider]  promethazine  (PHENERGAN ) 25 MG suppository Place 1 suppository (25 mg total) rectally every 6 (six) hours as needed for nausea or vomiting. 05/14/24   Manuel Raynell Moder, MD  promethazine  (PHENERGAN ) 25 MG suppository Place 1 suppository (25 mg total) rectally every 6 (six) hours as needed for nausea or vomiting. 05/16/24   Manuel, Kayla N, PA-C  pseudoephedrine (SUDAFED) 120 MG 12 hr tablet Take 120 mg by mouth every 12 (twelve) hours as needed for congestion.    [provider]    Physical Exam: BP 126/80   Pulse 84   Temp 97.6 F (36.4 C) (Oral)   Resp 20   SpO2 98%  General: Pleasant, well-appearing young man laying in bed. No acute distress. HEENT: [place]/AT. Anicteric sclera CV: RRR. Hyperdynamic heart sounds. No Mcguire/r/g. No LE edema Pulmonary: Lungs CTAB. Normal effort. No wheezing or rales. Abdominal: Soft, nontender, nondistended. Normal bowel sounds. Extremities: Palpable radial and DP pulses. Normal ROM. Skin: Warm and dry. No obvious rash or lesions. Neuro: A&Ox3. Moves all extremities. Normal sensation to light touch. No focal deficit. Psych: Normal mood and affect          Labs on Admission:  Basic Metabolic Panel: Recent Labs  Lab 06/30/24 1831  NA 141  K 4.5  CL 107  CO2 23  GLUCOSE 111*  BUN 20  CREATININE 2.36*  CALCIUM  8.9   Liver Function Tests: No results for input(s): AST, ALT, ALKPHOS, BILITOT, PROT, ALBUMIN in the last 168 hours. No results for input(s): LIPASE, AMYLASE in the last 168 hours. No results for input(s): AMMONIA in the last 168 hours. CBC: Recent Labs  Lab 06/30/24 1831  WBC 17.8*  NEUTROABS 12.6*  HGB 6.3*  HCT 17.5*  MCV 110.8*  PLT 120*   Cardiac Enzymes: No results for input(s):  CKTOTAL, CKMB, CKMBINDEX, TROPONINI in the last 168 hours. BNP (last 3 results) No results for input(s): BNP in the last 8760 hours.  ProBNP (last 3 results) No results for input(s): PROBNP in the last 8760 hours.  CBG: No results for input(s): GLUCAP in the last 168 hours.  Radiological Exams on Admission: DG Chest Portable 1 View Result Date: 06/30/2024 EXAM: 1 VIEW(S) XRAY OF THE CHEST 06/30/2024 09:18:00 PM COMPARISON: 05/12/2024 CLINICAL HISTORY: Squamous cell carcinoma. FINDINGS: LUNGS AND PLEURA: Low lung volumes. Mild pulmonary edema, similar to prior study. No focal pulmonary opacity. No pleural effusion. No pneumothorax. HEART AND MEDIASTINUM: Mild cardiomegaly. No acute abnormality of the mediastinal silhouette. BONES AND SOFT TISSUES: Thoracic fusion hardware noted. No acute osseous abnormality. IMPRESSION: 1. Mild pulmonary edema, similar to prior study. 2. Mild cardiomegaly. Electronically signed by: Elsie Gravely MD 06/30/2024 09:27 PM EST RP Workstation: [id number]   Assessment/Plan Manuel PARAS  Mcguire is a 34 y.o. 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  # Sickle cell pain crisis - Presented with 2-3 days of worsening back pain, similar to his sickle cell pain crisis - Chest imaging does not show any evidence of acute chest - No significant relief with IV Dilaudid  in the ED, will admit for pain control - Start weight-based PCA pump - IV Toradol  15 mg Q6H for 5 days - Benadryl  25 mg PRN for itching - IV NS at 100 cc/h for 1 day - Continue home hydroxyurea  and vitamins  # Acute on chronic anemia # Anemia of chronic disease - Significant drop in Hgb to 6.3 from baseline of 8-9 - No evidence  of active bleeding or bruising, anemia likely secondary to vaso-occlusive crisis - Give 2 units PRBC and follow-up posttransfusion H&H - Trend CBC and transfuse for Hgb > 7  # AKI - Creatinine significantly elevated to 2.36 from normal baseline - Likely secondary to dehydration, severe anemia and renal ischemia from vaso-occlusive crisis - Give IVNS 1 L bolus followed by 100 cc/h infusion - Consider renal ultrasound if no improvement with IV hydration - Trend renal function and avoid nephrotoxic meds  # Thrombocytopenia - Platelet down to 120 likely from hydroxyurea  use - Continue hydrocortisone   # Cyclic vomiting syndrome - Continue amitriptyline   # Chronic pain syndrome - Continue as needed oxycodone   # GERD - Continue Protonix   DVT prophylaxis: Lovenox      Code Status: Full Code  Consults called: None  Family Communication: No family at bedside  Severity of Illness: The appropriate patient status for this patient is INPATIENT. Inpatient status is judged to be reasonable and necessary in order to provide the required intensity of service to ensure the patient's safety. The patient's presenting symptoms, physical exam findings, and initial radiographic and laboratory data in the context of their chronic comorbidities is felt to place them at high risk for further clinical deterioration. Furthermore, it is not anticipated that the patient will be medically stable for discharge from the hospital within 2 midnights of admission.   * I certify that at the point of admission it is my clinical judgment that the patient will require inpatient hospital care spanning beyond 2 midnights from the point of admission due to high intensity of service, high risk for further deterioration and high frequency of surveillance required.*  Level of care: Med-Surg    Lou Claretta HERO, MD 07/01/2024, 1:49 AM [facility] Pager: [phone] Isaiah 41:10   If 7PM-7AM, please contact  night-coverage [email or web address] Password [email or web address]     [1] No Known Allergies  "

## 2024-06-30 NOTE — Progress Notes (Signed)
 34 year old male with baseline history of sickle cell disease, cyclic vomiting,  followed by [facility] on hydroxyurea  presenting with vaso-occlusive crisis and acute on chronic anemia, AKI.  Has had generalized back and extremity pain above baseline-not well-controlled at home narcotic regimen.  Noted to have been admitted to [facility] 12/18-12/23 for sickle cell crisis. Presented to the med center with noted hemoglobin 6.3 with baseline hemoglobin around 9.  Retic  count of greater than 30.  Received multiple doses of IV Dilaudid  2 mg with minimal improvement in pain.  Chest x-ray pending.  Other notable labs include white count of 17.8, platelets of 120.  Creatinine of 2.36. Would be admission for acute vaso-occlusive crisis, PRBC transfusion as well as IV fluid hydration in setting of AKI.  Vitals:   06/30/24 1807 06/30/24 1900  BP: 118/80 114/83  Pulse: 95 87  Temp: 97.9 F (36.6 C)   Resp: 17 (!) 22  SpO2: 92% 97%  TempSrc: Oral     Lab Results  Component Value Date   WBC 17.8 (H) 06/30/2024   HGB 6.3 (LL) 06/30/2024   HCT 17.5 (L) 06/30/2024   MCV 110.8 (H) 06/30/2024   PLT 120 (L) 06/30/2024   Last metabolic panel Lab Results  Component Value Date   GLUCOSE 111 (H) 06/30/2024   NA 141 06/30/2024   K 4.5 06/30/2024   CL 107 06/30/2024   CO2 23 06/30/2024   BUN 20 06/30/2024   CREATININE 2.36 (H) 06/30/2024   GFRNONAA 36 (L) 06/30/2024   CALCIUM  8.9 06/30/2024   PHOS 4.5 01/16/2024   PROT 8.9 (H) 05/22/2024   ALBUMIN 4.7 05/22/2024   BILITOT 1.7 (H) 05/22/2024   ALKPHOS 101 05/22/2024   AST 36 05/22/2024   ALT 31 05/22/2024   ANIONGAP 12 06/30/2024

## 2024-06-30 NOTE — ED Notes (Signed)
 Pt placed on the cardiac monitor with the 5 lead and requested some water  to drink. Ok per [employer] pt given the same

## 2024-06-30 NOTE — ED Notes (Signed)
 [facility] called for transport.

## 2024-06-30 NOTE — ED Triage Notes (Signed)
 Pt with low back pain SCC.  Home meds not working. Followed at [facility].

## 2024-07-01 DIAGNOSIS — D696 Thrombocytopenia, unspecified: Secondary | ICD-10-CM

## 2024-07-01 DIAGNOSIS — D649 Anemia, unspecified: Secondary | ICD-10-CM

## 2024-07-01 DIAGNOSIS — D5700 Hb-SS disease with crisis, unspecified: Secondary | ICD-10-CM

## 2024-07-01 DIAGNOSIS — N179 Acute kidney failure, unspecified: Secondary | ICD-10-CM

## 2024-07-01 LAB — RETICULOCYTES
Immature Retic Fract: 40.4 % — AB (ref 2.3–15.9)
RBC.: 1.58 MIL/uL — ABNORMAL LOW (ref 4.22–5.81)
Retic Count, Absolute: 217.1 10*3/uL — AB (ref 19.0–186.0)
Retic Ct Pct: >30 % — ABNORMAL HIGH (ref 0.4–3.1)

## 2024-07-01 LAB — PREPARE RBC (CROSSMATCH)

## 2024-07-01 LAB — CBC
HCT: 16.8 % — ABNORMAL LOW (ref 39.0–52.0)
Hemoglobin: 6.2 g/dL — CL (ref 13.0–17.0)
MCH: 40.8 pg — ABNORMAL HIGH (ref 26.0–34.0)
MCHC: 36.9 g/dL — ABNORMAL HIGH (ref 30.0–36.0)
MCV: 110.5 fL — ABNORMAL HIGH (ref 80.0–100.0)
Platelets: 140 10*3/uL — ABNORMAL LOW (ref 150–400)
RBC: 1.52 MIL/uL — ABNORMAL LOW (ref 4.22–5.81)
RDW: 21.5 % — ABNORMAL HIGH (ref 11.5–15.5)
WBC: 16.3 10*3/uL — ABNORMAL HIGH (ref 4.0–10.5)
nRBC: 15.1 % — ABNORMAL HIGH (ref 0.0–0.2)

## 2024-07-01 LAB — COMPREHENSIVE METABOLIC PANEL WITH GFR
ALT: 42 U/L (ref 0–44)
AST: 165 U/L — ABNORMAL HIGH (ref 15–41)
Albumin: 3.9 g/dL (ref 3.5–5.0)
Alkaline Phosphatase: 81 U/L (ref 38–126)
Anion gap: 10 (ref 5–15)
BUN: 20 mg/dL (ref 6–20)
CO2: 23 mmol/L (ref 22–32)
Calcium: 9 mg/dL (ref 8.9–10.3)
Chloride: 108 mmol/L (ref 98–111)
Creatinine, Ser: 1.65 mg/dL — ABNORMAL HIGH (ref 0.61–1.24)
GFR, Estimated: 56 mL/min — ABNORMAL LOW
Glucose, Bld: 96 mg/dL (ref 70–99)
Potassium: 4.1 mmol/L (ref 3.5–5.1)
Sodium: 141 mmol/L (ref 135–145)
Total Bilirubin: 1.5 mg/dL — ABNORMAL HIGH (ref 0.0–1.2)
Total Protein: 7.5 g/dL (ref 6.5–8.1)

## 2024-07-01 LAB — HEMOGLOBIN AND HEMATOCRIT, BLOOD
HCT: 22.1 % — ABNORMAL LOW (ref 39.0–52.0)
Hemoglobin: 7.7 g/dL — ABNORMAL LOW (ref 13.0–17.0)

## 2024-07-01 MED ORDER — VITAMIN B-2 100 MG PO TABS
200.0000 mg | ORAL_TABLET | Freq: Two times a day (BID) | ORAL | Status: DC
  Filled 2024-07-01: qty 2

## 2024-07-01 MED ORDER — HYDROCORTISONE 10 MG PO TABS
10.0000 mg | ORAL_TABLET | Freq: Every day | ORAL | Status: DC
  Administered 2024-07-01 – 2024-07-03 (×3): 10 mg via ORAL
  Filled 2024-07-01 (×4): qty 1

## 2024-07-01 MED ORDER — CALCIUM CARBONATE 1250 (500 CA) MG PO TABS
1250.0000 mg | ORAL_TABLET | Freq: Every day | ORAL | Status: DC
  Administered 2024-07-01 – 2024-07-04 (×4): 1250 mg via ORAL
  Filled 2024-07-01 (×4): qty 1

## 2024-07-01 MED ORDER — HYDROCORTISONE 20 MG PO TABS
20.0000 mg | ORAL_TABLET | Freq: Every day | ORAL | Status: DC
  Administered 2024-07-01 – 2024-07-04 (×4): 20 mg via ORAL
  Filled 2024-07-01 (×4): qty 1

## 2024-07-01 NOTE — Progress Notes (Signed)
 SICKLE CELL SERVICE PROGRESS NOTE  Manuel Mcguire:[id number] DOB: 1990/11/25 DOA: 06/30/2024 PCP: Irean Karolynn Batters, NP  Assessment/Plan: Principal Problem:   Sickle cell pain crisis (HCC) Active Problems:   Cyclic vomiting syndrome   Acute on chronic anemia   AKI (acute kidney injury)   Thrombocytopenia  Sickle cell pain crisis: Patient on Dilaudid  PCA, Toradol , and IV fluids.  Also on oral Roxicodone .  Patient will continue to be reassessed for pain. Acute on chronic anemia: Hemoglobin down to 6.2 from 9.9 in December.  Patient will be transfused 1 unit of packed red blood cells.  Continue with H&H.  Continue monitor. Chronic pain syndrome: Continue chronic pain management. AKI: Improving with hydration and other supportive care. GERD: Continue with PPIs Thrombocytopenia: Stable.  Code Status: Full code Family Communication: No family at bedside Disposition Plan: Home when ready  [facility]  Pager [phone]. If 7PM-7AM, please contact night-coverage.  07/01/2024, 10:12 AM  LOS: 1 day   Brief narrative: Manuel Mcguire is a 34 y.o. 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 Patient reports he had a follow-up with his sickle cell team at [facility] on Thursday and was informed that his hemoglobin was down to 7.5 with a drop in his platelet. Over the last 2 days, he has had worsening back pain that has not improved with his home as needed pain medicine. He called his sickle cell provider and was advised to present to the ED for further evaluation. He denies any chest pain, cough, fever, chills, nausea, vomiting, abdominal pain, leg pain, bloody stools, dizziness, dysuria or headache.   Consultants: None  Procedures: Chest  x-ray  Antibiotics: None  HPI/Subjective: Patient is having significant pain.  Hemoglobin dropped to 6.2 requiring transfusion.  Patient also has significant weakness.  Objective: Vitals:   07/01/24 0400 07/01/24 0623 07/01/24 0815 07/01/24 0957  BP: (!) 156/95   123/75  Pulse: (!) 110   94  Resp: 16 16 15 18   Temp: 97.8 F (36.6 C)     TempSrc: Oral   Oral  SpO2: 98% 98% 100% 93%   Weight change:   Intake/Output Summary (Last 24 hours) at 07/01/2024 1012 Last data filed at 07/01/2024 0955 Gross per 24 hour  Intake --  Output 200 ml  Net -200 ml    General: Alert, awake, oriented x3, in no acute distress.  HEENT: [place]/AT PEERL, EOMI Neck: Trachea midline,  no masses, no thyromegal,y no JVD, no carotid bruit OROPHARYNX:  Moist, No exudate/ erythema/lesions.  Heart: Regular rate and rhythm, without murmurs, rubs, gallops, PMI non-displaced, no heaves or thrills on palpation.  Lungs: Clear to auscultation, no wheezing or rhonchi noted. No increased vocal fremitus resonant to percussion  Abdomen: Soft, nontender, nondistended, positive bowel sounds, no masses no hepatosplenomegaly noted..  Neuro: No focal neurological deficits noted cranial nerves II through XII grossly intact. DTRs 2+ bilaterally upper and lower extremities. Strength 5 out of 5 in bilateral upper and lower extremities. Musculoskeletal: No warm swelling or erythema around joints, no spinal tenderness noted. Psychiatric: Patient alert and oriented x3, good insight and cognition, good recent to remote recall. Lymph node survey: No cervical axillary or inguinal lymphadenopathy noted.   Data Reviewed: Basic Metabolic Panel: Recent Labs  Lab 06/30/24 1831 07/01/24 0524  NA 141  141  K 4.5 4.1  CL 107 108  CO2 23 23  GLUCOSE 111* 96  BUN 20 20  CREATININE 2.36* 1.65*  CALCIUM  8.9 9.0   Liver Function Tests: Recent Labs  Lab 07/01/24 0524  AST 165*  ALT 42  ALKPHOS 81  BILITOT 1.5*  PROT 7.5  ALBUMIN  3.9   No results for input(s): LIPASE, AMYLASE in the last 168 hours. No results for input(s): AMMONIA in the last 168 hours. CBC: Recent Labs  Lab 06/30/24 1831 07/01/24 0524  WBC 17.8* 16.3*  NEUTROABS 12.6*  --   HGB 6.3* 6.2*  HCT 17.5* 16.8*  MCV 110.8* 110.5*  PLT 120* 140*   Cardiac Enzymes: No results for input(s): CKTOTAL, CKMB, CKMBINDEX, TROPONINI in the last 168 hours. BNP (last 3 results) No results for input(s): BNP in the last 8760 hours.  ProBNP (last 3 results) No results for input(s): PROBNP in the last 8760 hours.  CBG: No results for input(s): GLUCAP in the last 168 hours.  No results found for this or any previous visit (from the past 240 hours).   Studies: DG Chest Portable 1 View Result Date: 06/30/2024 EXAM: 1 VIEW(S) XRAY OF THE CHEST 06/30/2024 09:18:00 PM COMPARISON: 05/12/2024 CLINICAL HISTORY: Squamous cell carcinoma. FINDINGS: LUNGS AND PLEURA: Low lung volumes. Mild pulmonary edema, similar to prior study. No focal pulmonary opacity. No pleural effusion. No pneumothorax. HEART AND MEDIASTINUM: Mild cardiomegaly. No acute abnormality of the mediastinal silhouette. BONES AND SOFT TISSUES: Thoracic fusion hardware noted. No acute osseous abnormality. IMPRESSION: 1. Mild pulmonary edema, similar to prior study. 2. Mild cardiomegaly. Electronically signed by: Elsie Gravely MD 06/30/2024 09:27 PM EST RP Workstation: [id number]    Scheduled Meds:  sodium chloride    Intravenous Once   amitriptyline   100 mg Oral QHS   calcium  carbonate  1,250 mg Oral Daily   cholecalciferol   1,000 Units Oral Daily   enoxaparin  (LOVENOX ) injection  40 mg Subcutaneous Q24H   folic acid   1 mg Oral Daily   hydrocortisone   10 mg Oral Q2200   hydrocortisone   20 mg Oral Daily   HYDROmorphone    Intravenous Q4H   hydroxyurea   1,500 mg Oral Daily   ketorolac   15 mg Intravenous Q6H   L-glutamine   15 g Oral BID   pantoprazole   40 mg Oral QAC breakfast    riboflavin   200 mg Oral BID   senna-docusate  1 tablet Oral BID   Continuous Infusions:  sodium chloride  100 mL/hr at 07/01/24 0050    Principal Problem:   Sickle cell pain crisis (HCC) Active Problems:   Cyclic vomiting syndrome   Acute on chronic anemia   AKI (acute kidney injury)   Thrombocytopenia

## 2024-07-01 NOTE — Plan of Care (Signed)

## 2024-07-01 NOTE — Progress Notes (Addendum)
" ° ° ° °  Patient Name: Manuel Mcguire           DOB: 1991/02/11  MRN: [id number]      Admission Date: 06/30/2024  Attending Provider: Sim Emery CROME, MD  Primary Diagnosis: Sickle cell pain crisis (HCC)   Level of care: Med-Surg   OVERNIGHT EVENT   I was made aware by bedside RN that there was a possible lab discrepancy related to patient's CBC results.   Prior hemoglobin 6.3.  Transfusion has not been started yet. I have placed a STAT CBC order to verify results.   [name], DNP, ACNPC- AG [facility] [place]    "

## 2024-07-01 NOTE — Progress Notes (Signed)
 One unit of Blood transfusion complete; sent message to Dr. Sim to inquire about transfusion of 2nd unit; received message to hold 2nd unit at this time; will continue to monitor

## 2024-07-01 NOTE — Progress Notes (Signed)
 IV team returned and placed an IV. Approximately ten minutes later, the IV was removed and the patient was bleeding. Patient stated he did not know how the IV was removed.

## 2024-07-01 NOTE — Progress Notes (Signed)
 Patient refused IV placement for a blood transfusion with the IV team. He locked himself in the bathroom for 45 minutes with the PCA and he told staff that he did not need a blood transfusion right now and that he only needed dilaudid . Provider notified.

## 2024-07-02 NOTE — Plan of Care (Signed)
" °  Problem: Education: Goal: Knowledge of General Education information will improve Description: Including pain rating scale, medication(s)/side effects and non-pharmacologic comfort measures Outcome: Progressing   Problem: Health Behavior/Discharge Planning: Goal: Ability to manage health-related needs will improve Outcome: Progressing   Problem: Clinical Measurements: Goal: Ability to maintain clinical measurements within normal limits will improve Outcome: Progressing   Problem: Clinical Measurements: Goal: Will remain free from infection Outcome: Progressing   Problem: Clinical Measurements: Goal: Diagnostic test results will improve Outcome: Progressing   Problem: Clinical Measurements: Goal: Respiratory complications will improve Outcome: Progressing   Problem: Clinical Measurements: Goal: Cardiovascular complication will be avoided Outcome: Progressing   Problem: Activity: Goal: Risk for activity intolerance will decrease Outcome: Progressing   Problem: Nutrition: Goal: Adequate nutrition will be maintained Outcome: Progressing   Problem: Activity: Goal: Risk for activity intolerance will decrease Outcome: Progressing   Problem: Nutrition: Goal: Adequate nutrition will be maintained Outcome: Progressing   Problem: Coping: Goal: Level of anxiety will decrease Outcome: Progressing   "

## 2024-07-02 NOTE — Progress Notes (Addendum)
 Patient ID: ETHELBERT THAIN, male   DOB: 12/31/1990, 34 y.o.   MRN: [id number] Subjective:  Manuel Mcguire is a 34 y.o. 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 Patient reports he had a follow-up with his sickle cell team at [facility] on Thursday and was informed that his hemoglobin was down to 7.5 with a drop in his platelet. Over the last 2 days, he has had worsening back pain that has not improved with his home medication.  Patient is endorsing pain today of 7/10.  He has no new concerns.  Denies nausea vomiting diarrhea.  No flulike symptoms.  No urinary symptoms.  Objective:  Vital signs in last 24 hours:  Vitals:   07/01/24 2331 07/02/24 0113 07/02/24 0427 07/02/24 0949  BP:  129/85    Pulse:  98    Resp: 15 20 16 17   Temp:  98.1 F (36.7 C)    TempSrc:  Oral    SpO2:  98%  98%    Intake/Output from previous day:   Intake/Output Summary (Last 24 hours) at 07/02/2024 1215 Last data filed at 07/01/2024 1700 Gross per 24 hour  Intake 1997.5 ml  Output 1350 ml  Net 647.5 ml    Physical Exam: General: Alert, awake, oriented x3, in no acute distress.  HEENT: [place]/AT PEERL, EOMI Neck: Trachea midline,  no masses, no thyromegal,y no JVD, no carotid bruit OROPHARYNX:  Moist, No exudate/ erythema/lesions.  Heart: Regular rate and rhythm, without murmurs, rubs, gallops, PMI non-displaced, no heaves or thrills on palpation.  Lungs: Clear to auscultation, no wheezing or rhonchi noted. No increased vocal fremitus resonant to percussion  Abdomen: Soft, nontender, nondistended, positive bowel sounds, no masses no hepatosplenomegaly noted..  Neuro: No focal neurological deficits noted cranial nerves II through XII grossly intact. DTRs 2+ bilaterally upper and  lower extremities. Strength 5 out of 5 in bilateral upper and lower extremities. Musculoskeletal: Lower back tenderness. Psychiatric: Patient alert and oriented x3, good insight and cognition, good recent to remote recall. Lymph node survey: No cervical axillary or inguinal lymphadenopathy noted.  Lab Results:  Basic Metabolic Panel:    Component Value Date/Time   NA 141 07/01/2024 0524   K 4.1 07/01/2024 0524   CL 108 07/01/2024 0524   CO2 23 07/01/2024 0524   BUN 20 07/01/2024 0524   CREATININE 1.65 (H) 07/01/2024 0524   GLUCOSE 96 07/01/2024 0524   CALCIUM  9.0 07/01/2024 0524   CBC:    Component Value Date/Time   WBC 16.3 (H) 07/01/2024 0524   HGB 7.7 (L) 07/01/2024 1605   HCT 22.1 (L) 07/01/2024 1605   PLT 140 (L) 07/01/2024 0524   MCV 110.5 (H) 07/01/2024 0524   NEUTROABS 12.6 (H) 06/30/2024 1831   LYMPHSABS 2.1 06/30/2024 1831   MONOABS 2.6 (H) 06/30/2024 1831   EOSABS 0.2 06/30/2024 1831   BASOSABS 0.0 06/30/2024 1831    No results found for this or any previous visit (from the past 240 hours).  Studies/Results: DG Chest Portable 1 View Result Date: 06/30/2024 EXAM: 1 VIEW(S) XRAY OF THE CHEST 06/30/2024 09:18:00 PM COMPARISON: 05/12/2024 CLINICAL HISTORY: Squamous cell carcinoma. FINDINGS: LUNGS AND PLEURA: Low lung volumes. Mild pulmonary edema, similar to prior study. No focal pulmonary opacity. No pleural effusion. No pneumothorax. HEART AND MEDIASTINUM: Mild cardiomegaly.  No acute abnormality of the mediastinal silhouette. BONES AND SOFT TISSUES: Thoracic fusion hardware noted. No acute osseous abnormality. IMPRESSION: 1. Mild pulmonary edema, similar to prior study. 2. Mild cardiomegaly. Electronically signed by: Elsie Gravely MD 06/30/2024 09:27 PM EST RP Workstation: [id number]    Medications: Scheduled Meds:  amitriptyline   100 mg Oral QHS   calcium  carbonate  1,250 mg Oral Daily   cholecalciferol   1,000 Units Oral Daily   enoxaparin  (LOVENOX ) injection   40 mg Subcutaneous Q24H   folic acid   1 mg Oral Daily   hydrocortisone   10 mg Oral Q2200   hydrocortisone   20 mg Oral Daily   HYDROmorphone    Intravenous Q4H   hydroxyurea   1,500 mg Oral Daily   ketorolac   15 mg Intravenous Q6H   L-glutamine   15 g Oral BID   pantoprazole   40 mg Oral QAC breakfast   riboflavin   200 mg Oral BID   senna-docusate  1 tablet Oral BID   Continuous Infusions: PRN Meds:.diphenhydrAMINE , naloxone  **AND** sodium chloride  flush, ondansetron  **OR** ondansetron  (ZOFRAN ) IV, oxyCODONE , polyethylene glycol  Consultants: None  Procedures: Blood transfusion [completed]  Antibiotics: None  Assessment/Plan: Principal Problem:   Sickle cell pain crisis (HCC) Active Problems:   Acute on chronic anemia   AKI (acute kidney injury)   Thrombocytopenia   Hb Sickle Cell Disease with Pain crisis: Continue IVF at K[facility].  Continue weight based Dilaudid  PCA, IV Toradol  contraindicated for patient due to AKI.  Continue oral home pain medications as ordered. Monitor vitals very closely, Re-evaluate pain scale regularly, 2 L of Oxygen by [place]. Patient encouraged to ambulate on the hallway today.  Leukocytosis: WBC elevated most likely due to sickle cell pain crisis.  No acute signs or symptoms of infection.  Will continue to monitor daily CBC. Anemia of Chronic Disease: Hemoglobin at baseline 7.7 g/dL after transfusion of 1 unit packed red blood cells.  Continue to monitor daily CBC. Chronic pain Syndrome: Continue oral home pain medication. JXP:Rmzjupwpwz gradually improving to 1.65. from 2.36.  Avoid nephrotoxic meds, will continue to monitor.   Code Status: Full Code Family Communication: N/A Disposition Plan: Not yet ready for discharge  [name] Cover NP  If 7PM-7AM, please contact night-coverage.  07/02/2024, 12:15 PM  LOS: 2 days

## 2024-07-02 NOTE — Plan of Care (Signed)
educated

## 2024-07-03 LAB — CBC
HCT: 20.9 % — ABNORMAL LOW (ref 39.0–52.0)
Hemoglobin: 7.5 g/dL — ABNORMAL LOW (ref 13.0–17.0)
MCH: 37.3 pg — ABNORMAL HIGH (ref 26.0–34.0)
MCHC: 35.9 g/dL (ref 30.0–36.0)
MCV: 104 fL — ABNORMAL HIGH (ref 80.0–100.0)
Platelets: 259 10*3/uL (ref 150–400)
RBC: 2.01 MIL/uL — ABNORMAL LOW (ref 4.22–5.81)
RDW: 24.7 % — ABNORMAL HIGH (ref 11.5–15.5)
WBC: 13.6 10*3/uL — ABNORMAL HIGH (ref 4.0–10.5)
nRBC: 18.6 % — ABNORMAL HIGH (ref 0.0–0.2)

## 2024-07-03 LAB — BASIC METABOLIC PANEL WITH GFR
Anion gap: 8 (ref 5–15)
BUN: 12 mg/dL (ref 6–20)
CO2: 27 mmol/L (ref 22–32)
Calcium: 9.1 mg/dL (ref 8.9–10.3)
Chloride: 105 mmol/L (ref 98–111)
Creatinine, Ser: 1.12 mg/dL (ref 0.61–1.24)
GFR, Estimated: >60 mL/min
Glucose, Bld: 119 mg/dL — ABNORMAL HIGH (ref 70–99)
Potassium: 3.9 mmol/L (ref 3.5–5.1)
Sodium: 140 mmol/L (ref 135–145)

## 2024-07-03 LAB — MISC LABCORP TEST (SEND OUT)
LabCorp test name: 83935
Labcorp test code: 83935

## 2024-07-03 NOTE — TOC Initial Note (Signed)
 Transition of Care [facility]) - Initial/Assessment Note    Patient Details  Name: Manuel Mcguire MRN: [id number] Date of Birth: 1990-07-18  [facility] of [facility] CM/SW Contact:    Doneta Glenys DASEN, RN Phone Number: 07/03/2024, 12:14 PM  Clinical Narrative:                 PTA lives with parents in a house. Denies DME, HH, and SDOH needs. Patients father will transport at discharge. No IP CM needs identified.  Expected Discharge Plan: Home/Self Care Barriers to Discharge: No Barriers Identified   Patient Goals and CMS Choice Patient states their goals for this hospitalization and ongoing recovery are:: Home with parents CMS Medicare.gov Compare Post Acute Care list provided to:: Patient Choice offered to / list presented to : Patient [place] ownership interest in [facility].provided to:: Patient    Expected Discharge Plan and Services In-house Referral: NA Discharge Planning Services: CM Consult   Living arrangements for the past 2 months: Single Family Home                 DME Arranged: N/A DME Agency: NA       HH Arranged: NA HH Agency: NA        Prior Living Arrangements/Services Living arrangements for the past 2 months: Single Family Home Lives with:: Parents Patient language and need for interpreter reviewed:: Yes Do you feel safe going back to the place where you live?: Yes      Need for Family Participation in Patient Care: Yes (Comment) Care giver support system in place?: Yes (comment) Current home services:  (NA) Criminal Activity/Legal Involvement Pertinent to Current Situation/Hospitalization: No - Comment as needed  Activities of Daily Living   ADL Screening (condition at time of admission) Independently performs ADLs?: Yes (appropriate for developmental age) Is the patient deaf or have difficulty hearing?: No Does the patient have difficulty seeing, even when wearing glasses/contacts?: No Does the patient have difficulty  concentrating, remembering, or making decisions?: No  Permission Sought/Granted Permission sought to share information with : Case Manager Permission granted to share information with : Yes, Verbal Permission Granted  Share Information with NAME: Neth,Albert  Father, Emergency Contact  [phone]           Emotional Assessment Appearance:: Appears stated age Attitude/Demeanor/Rapport: Engaged Affect (typically observed): Appropriate Orientation: : Oriented to Self, Oriented to Place, Oriented to  Time, Oriented to Situation Alcohol / Substance Use: Not Applicable Psych Involvement: No (comment)  Admission diagnosis:  Sickle cell disease with crisis (HCC) [D57.00] Sickle cell pain crisis (HCC) [D57.00] Patient Active Problem List   Diagnosis Date Noted   Acute on chronic anemia 07/01/2024   AKI (acute kidney injury) 07/01/2024   Thrombocytopenia 07/01/2024   Cyclic vomiting syndrome 05/14/2024   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 11/30/2023   Abdominal pain 11/26/2023   Cholelithiasis 11/25/2023   Constipation 11/23/2023   Pruritus 11/18/2023   Sickle cell anemia with pain (HCC) 11/16/2023   Anemia of chronic disease 11/16/2023   Leukocytosis 11/16/2023   Chronic pain syndrome 11/16/2023   Nausea vomiting and diarrhea 08/26/2023   Sickle cell pain crisis (HCC) 02/05/2023   Acute respiratory failure with hypoxia (HCC) 02/05/2023   CAP (community acquired pneumonia) 02/05/2023   Sickle cell crisis (HCC) 10/27/2012   PCP:  Irean Karolynn Batters, NP Pharmacy:   [facility] [phone] [address], [place] - [address] [place] [address] Phone: [phone] Fax: [phone]  Social Drivers of Health (SDOH) Social History: SDOH Screenings   Food Insecurity: No Food Insecurity (07/01/2024)  Housing: High Risk (07/01/2024)  Transportation Needs: No Transportation Needs (07/01/2024)  Utilities: Not At Risk  (07/01/2024)  Financial Resource Strain: Low Risk  (05/25/2024)   Received from [facility]  Social Connections: Moderately Isolated (01/21/2024)  Tobacco Use: Unknown (06/30/2024)  Recent Concern: Tobacco Use - Medium Risk (06/28/2024)   Received from [facility]   SDOH Interventions: Food Insecurity Interventions: Intervention Not Indicated, Inpatient TOC Housing Interventions: Intervention Not Indicated, Inpatient TOC Transportation Interventions: Intervention Not Indicated Utilities Interventions: Intervention Not Indicated, Inpatient TOC   Readmission Risk Interventions    07/03/2024   12:12 PM 03/13/2024    3:23 PM 01/16/2024    9:53 AM  Readmission Risk Prevention Plan  Transportation Screening Complete Complete Complete  Medication Review Oceanographer) Complete Complete Complete  PCP or Specialist appointment within 3-5 days of discharge Complete Complete Complete  HRI or Home Care Consult Complete Complete Complete  SW Recovery Care/Counseling Consult Complete Complete Complete  Palliative Care Screening Not Applicable Not Applicable Not Applicable  Skilled Nursing Facility Not Applicable Not Applicable Not Applicable

## 2024-07-03 NOTE — Plan of Care (Signed)
   Problem: Education: Goal: Knowledge of General Education information will improve Description Including pain rating scale, medication(s)/side effects and non-pharmacologic comfort measures Outcome: Progressing

## 2024-07-03 NOTE — Progress Notes (Cosign Needed)
 Patient ID: Manuel Mcguire, male   DOB: 20-Dec-1990, 34 y.o.   MRN: [id number] Subjective:  Manuel Mcguire is a 34 y.o. 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 Patient reports he had a follow-up with his sickle cell team at [facility] on Thursday and was informed that his hemoglobin was down to 7.5 with a drop in his platelet. Over the last 2 days, he has had worsening back pain that has not improved with his home medication.  Patient is endorsing improved pain today of 6/10.  He has no new concerns.  Denies nausea vomiting diarrhea.  No flulike symptoms.  No urinary symptoms.  Objective:  Vital signs in last 24 hours:  Vitals:   07/03/24 2352 07/04/24 0318 07/04/24 0345 07/04/24 0807  BP: (!) 156/107 (!) 161/86    Pulse: 64 80    Resp: 16 16 20 20   Temp: 97.9 F (36.6 C) 97.9 F (36.6 C)    TempSrc:  Oral    SpO2: 100% 100% 100% 100%    Intake/Output from previous day:   Intake/Output Summary (Last 24 hours) at 07/04/2024 1244 Last data filed at 07/04/2024 1000 Gross per 24 hour  Intake --  Output 1600 ml  Net -1600 ml    Physical Exam: General: Alert, awake, oriented x3, in no acute distress.  HEENT: [place]/AT PEERL, EOMI Neck: Trachea midline,  no masses, no thyromegal,y no JVD, no carotid bruit OROPHARYNX:  Moist, No exudate/ erythema/lesions.  Heart: Regular rate and rhythm, without murmurs, rubs, gallops, PMI non-displaced, no heaves or thrills on palpation.  Lungs: Clear to auscultation, no wheezing or rhonchi noted. No increased vocal fremitus resonant to percussion  Abdomen: Soft, nontender, nondistended, positive bowel sounds, no masses no hepatosplenomegaly noted..  Neuro: No focal neurological deficits noted cranial nerves II through XII  grossly intact. DTRs 2+ bilaterally upper and lower extremities. Strength 5 out of 5 in bilateral upper and lower extremities. Musculoskeletal: Lower back tenderness. Psychiatric: Patient alert and oriented x3, good insight and cognition, good recent to remote recall. Lymph node survey: No cervical axillary or inguinal lymphadenopathy noted.  Lab Results:  Basic Metabolic Panel:    Component Value Date/Time   NA 140 07/03/2024 1429   K 3.9 07/03/2024 1429   CL 105 07/03/2024 1429   CO2 27 07/03/2024 1429   BUN 12 07/03/2024 1429   CREATININE 1.12 07/03/2024 1429   GLUCOSE 119 (H) 07/03/2024 1429   CALCIUM  9.1 07/03/2024 1429   CBC:    Component Value Date/Time   WBC 14.8 (H) 07/04/2024 0445   HGB 8.2 (L) 07/04/2024 0445   HCT 22.6 (L) 07/04/2024 0445   PLT 325 07/04/2024 0445   MCV 103.7 (H) 07/04/2024 0445   NEUTROABS 12.6 (H) 06/30/2024 1831   LYMPHSABS 2.1 06/30/2024 1831   MONOABS 2.6 (H) 06/30/2024 1831   EOSABS 0.2 06/30/2024 1831   BASOSABS 0.0 06/30/2024 1831    No results found for this or any previous visit (from the past 240 hours).  Studies/Results: No results found.   Medications: Scheduled Meds:  amitriptyline   100 mg Oral QHS   calcium  carbonate  1,250 mg Oral Daily   cholecalciferol   1,000 Units Oral Daily   enoxaparin  (LOVENOX ) injection  40 mg Subcutaneous Q24H   folic acid   1 mg  Oral Daily   hydrocortisone   10 mg Oral Q2200   hydrocortisone   20 mg Oral Daily   HYDROmorphone    Intravenous Q4H   hydroxyurea   1,500 mg Oral Daily   ketorolac   15 mg Intravenous Q6H   L-glutamine   15 g Oral BID   pantoprazole   40 mg Oral QAC breakfast   riboflavin   200 mg Oral BID   senna-docusate  1 tablet Oral BID   Continuous Infusions: PRN Meds:.diphenhydrAMINE , naloxone  **AND** sodium chloride  flush, ondansetron  **OR** ondansetron  (ZOFRAN ) IV, oxyCODONE , polyethylene glycol  Consultants: None  Procedures: Blood transfusion  [completed]  Antibiotics: None  Assessment/Plan: Principal Problem:   Sickle cell pain crisis (HCC) Active Problems:   Acute on chronic anemia   AKI (acute kidney injury)   Thrombocytopenia   Hb Sickle Cell Disease with Pain crisis: Continue IVF at [facility].  Continue weight based Dilaudid  PCA, IV Toradol  contraindicated for patient due to AKI.  Continue oral home pain medications as ordered. Monitor vitals very closely, Re-evaluate pain scale regularly, 2 L of Oxygen by [place]. Patient encouraged to ambulate on the hallway today.  Leukocytosis: WBC elevated most likely due to sickle cell pain crisis.  No acute signs or symptoms of infection.  Will continue to monitor daily CBC. Anemia of Chronic Disease: Hemoglobin at baseline 7. 5 g/dL after transfusion of 1 unit packed red blood cells.  Continue to monitor daily CBC. Chronic pain Syndrome: Continue oral home pain medication. JXP:Rmzjupwpwz gradually improving to 1.12 from 2.36.  Avoid nephrotoxic meds, will continue to monitor.   Code Status: Full Code Family Communication: N/A Disposition Plan: ready  for discharge in the AM.  [name] Cover NP  If 7PM-7AM, please contact night-coverage.  07/03/2024, 12:44 PM  LOS: 4 days

## 2024-07-03 NOTE — Progress Notes (Signed)
 Upon chart review, the patient is currently admitted. Will follow up once discharged  Manuel Mcguire  [facility], [facility] Guide  Direct Dial: [phone]  Fax [phone]

## 2024-07-04 LAB — CBC
HCT: 22.6 % — ABNORMAL LOW (ref 39.0–52.0)
Hemoglobin: 8.2 g/dL — ABNORMAL LOW (ref 13.0–17.0)
MCH: 37.6 pg — ABNORMAL HIGH (ref 26.0–34.0)
MCHC: 36.3 g/dL — ABNORMAL HIGH (ref 30.0–36.0)
MCV: 103.7 fL — ABNORMAL HIGH (ref 80.0–100.0)
Platelets: 325 10*3/uL (ref 150–400)
RBC: 2.18 MIL/uL — ABNORMAL LOW (ref 4.22–5.81)
RDW: 24 % — ABNORMAL HIGH (ref 11.5–15.5)
WBC: 14.8 10*3/uL — ABNORMAL HIGH (ref 4.0–10.5)
nRBC: 13.5 % — ABNORMAL HIGH (ref 0.0–0.2)

## 2024-07-04 LAB — TYPE AND SCREEN
ABO/RH(D): A POS
Antibody Screen: NEGATIVE
Unit division: 0
Unit division: 0

## 2024-07-04 LAB — BPAM RBC
Blood Product Expiration Date: 202602252359
Blood Product Expiration Date: 202602272359
ISSUE DATE / TIME: 202601251008
Unit Type and Rh: 6200
Unit Type and Rh: 6200

## 2024-07-04 MED ORDER — LACTULOSE 10 GM/15ML PO SOLN
30.0000 g | Freq: Once | ORAL | Status: AC
  Administered 2024-07-04: 30 g via ORAL
  Filled 2024-07-04: qty 30
  Filled 2024-07-04: qty 45

## 2024-07-04 NOTE — Plan of Care (Signed)
" °  Problem: Clinical Measurements: Goal: Respiratory complications will improve Outcome: Progressing Goal: Cardiovascular complication will be avoided Outcome: Progressing   Problem: Activity: Goal: Risk for activity intolerance will decrease Outcome: Progressing   Problem: Coping: Goal: Level of anxiety will decrease Outcome: Progressing   Problem: Elimination: Goal: Will not experience complications related to bowel motility Outcome: Progressing Goal: Will not experience complications related to urinary retention Outcome: Progressing   Problem: Pain Managment: Goal: General experience of comfort will improve and/or be controlled Outcome: Progressing   Problem: Safety: Goal: Ability to remain free from injury will improve Outcome: Progressing   Problem: Education: Goal: Awareness of signs and symptoms of anemia will improve Outcome: Progressing   Problem: Self-Care: Goal: Ability to incorporate actions that prevent/reduce pain crisis will improve Outcome: Progressing   Problem: Respiratory: Goal: Pulmonary complications will be avoided or minimized Outcome: Progressing   Problem: Sensory: Goal: Pain level will decrease with appropriate interventions Outcome: Progressing   "

## 2024-07-04 NOTE — Progress Notes (Signed)
 Discharge instructions reviewed with patient and patient's father, verbalized understanding. All questions answered. All belongings accounted for. Patient to follow up with MD in  1-2 weeks. PIV removed. Assisted via WC to private vehicle.

## 2024-07-04 NOTE — Discharge Summary (Signed)
 Physician Discharge Summary  Manuel Mcguire FMW:[id number] DOB: December 04, 1990 DOA: 06/30/2024  PCP: Irean Karolynn Batters, NP  Admit date: 06/30/2024  Discharge date: 07/04/2024  Discharge Diagnoses:  Principal Problem:   Sickle cell pain crisis (HCC) Active Problems:   Acute on chronic anemia   AKI (acute kidney injury)   Thrombocytopenia   Discharge Condition: Stable  Disposition:  Pt is discharged home in good condition and is to follow up with Dep, Karolynn Batters, NP this week to have labs evaluated. Manuel Mcguire is instructed to increase activity slowly and balance with rest for the next few days, and use prescribed medication to complete treatment of pain  Diet: Regular Wt Readings from Last 3 Encounters:  05/22/24 77.1 kg  05/16/24 77.1 kg  05/13/24 77.1 kg    Manuel Mcguire is a 34 y.o. 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 Patient reports he had a follow-up with his sickle cell team at [facility] on Thursday and was informed that his hemoglobin was down to 7.5 with a drop in his platelet. Over the last few days, he has had worsening back pain that has not improved with his home pain medicine. He called his sickle cell provider and was advised to present to the ED for further evaluation. He denied chest pain, cough, fever, chills, nausea, vomiting, abdominal pain, leg pain, bloody stools, dizziness, dysuria or headache.    ED Course:  BP 118/80 (BP Location: Right Arm)  Pulse 95  Temp 97.9 F (36.6 C) (Oral)  Resp 17  SpO2 92%  Initial vitals showed patient afebrile. Initial labs significant for creatinine 2.36, Hgb 6.3, WBC 17.8, platelet 120, reticulocyte count >30. CXR shows mild cardiomegaly and pulmonary edema but no active  disease. Pt received IV Dilaudid  2 mg x 3 doses without significant relief.  Patient was admitted for further sickle cell pain crisis management.  Hospital Course:  Patient was admitted for sickle cell pain crisis and managed appropriately with IVF, IV Dilaudid  via PCA and IV Toradol , while admitted patient's hemoglobin dropped below patient's baseline at 6.2 g/dL.  He was transfused with 1 unit packed red blood cells.  Hemoglobin is at 7.5 g/dL today.  Patient tolerated transfusion without reactions.  Creatinine improved to 1.12, Patient is able to tolerate p.o. without nausea or vomiting, ambulating without assistance and reporting pain at baseline. Patient was therefore discharged home today in a hemodynamically stable condition.   Manuel Mcguire will follow-up with PCP. Manuel Mcguire was counseled extensively about nonpharmacologic means of pain management, patient verbalized understanding and was appreciative of  the care received during this admission.   We discussed the need for good hydration, monitoring of hydration status, avoidance of heat, cold, stress, and infection triggers. We discussed the need to be adherent with taking his home medications. Patient was reminded of the need to seek medical attention immediately if any symptom of bleeding, anemia, or infection occurs.  Discharge Exam: Vitals:   07/04/24 0345 07/04/24 0807  BP:    Pulse:    Resp: 20 20  Temp:    SpO2: 100% 100%   Vitals:   07/03/24 2352 07/04/24 0318 07/04/24 0345 07/04/24 0807  BP: (!) 156/107 (!) 161/86    Pulse: 64 80    Resp: 16 16 20 20   Temp:  97.9 F (36.6 C) 97.9 F (36.6 C)    TempSrc:  Oral    SpO2: 100% 100% 100% 100%    General appearance : Awake, alert, not in any distress. Speech Clear. Not toxic looking HEENT: Atraumatic and Normocephalic, pupils equally reactive to light and accomodation Neck: Supple, no JVD. No cervical lymphadenopathy.  Chest: Good air entry bilaterally, no added sounds  CVS: S1  S2 regular, no murmurs.  Abdomen: Bowel sounds present, Non tender and not distended with no gaurding, rigidity or rebound. Extremities: B/L Lower Ext shows no edema, both legs are warm to touch Neurology: Awake alert, and oriented X 3, CN II-XII intact, Non focal Skin: No Rash  Discharge Instructions  Discharge Instructions     Call MD for:  persistant nausea and vomiting   Complete by: As directed    Call MD for:  severe uncontrolled pain   Complete by: As directed    Call MD for:  temperature >100.4   Complete by: As directed    Increase activity slowly   Complete by: As directed       Allergies as of 07/04/2024   No Known Allergies      Medication List     TAKE these medications    amitriptyline  25 MG tablet Commonly known as: ELAVIL  Take 25 mg by mouth at bedtime.   amitriptyline  100 MG tablet Commonly known as: ELAVIL  Take 100 mg by mouth at bedtime.   CALCIUM  PO Take 1 tablet by mouth daily.   cholecalciferol  25 MCG (1000 UNIT) tablet Commonly known as: VITAMIN D3 Take 1,000 Units by mouth daily.   Coenzyme Q10 100 MG capsule Take 100 mg by mouth 2 (two) times daily.   folic acid  1 MG tablet Commonly known as: FOLVITE  Take 1 tablet (1 mg total) by mouth daily.   hydrocortisone  10 MG tablet Commonly known as: CORTEF  Take 10-20 mg by mouth daily. 20mg  in the AM and 10mg  in the PM   hydroxyurea  500 MG capsule Commonly known as: HYDREA  Take 3 capsules (1,500 mg total) by mouth daily. May take with food to minimize GI side effects.   lisinopril  2.5 MG tablet Commonly known as: ZESTRIL  Take 2.5 mg by mouth daily.   lisinopril  5 MG tablet Commonly known as: ZESTRIL  Take 5 mg by mouth daily.   metoCLOPramide  10 MG tablet Commonly known as: REGLAN  Take 1 tablet (10 mg total) by mouth 3 (three) times daily with meals. What changed:  when to take this reasons to take this   ondansetron  4 MG disintegrating tablet Commonly known as:  ZOFRAN -ODT Take 1 tablet (4 mg total) by mouth every 8 (eight) hours as needed for nausea or vomiting.   oxyCODONE  5 MG immediate release tablet Commonly known as: Oxy IR/ROXICODONE  Take 1 tablet (5 mg total) by mouth every 6 (six) hours as needed for severe pain (pain score 7-10). What changed:  how much to take when to take this reasons to take this   pantoprazole  40 MG tablet Commonly known as: PROTONIX  Take 40 mg by mouth daily before breakfast.   promethazine  25 MG suppository Commonly known as: PHENERGAN  Place 1 suppository (25 mg total) rectally every 6 (six) hours as needed for nausea or vomiting.   pseudoephedrine 120 MG 12 hr tablet Commonly known as: SUDAFED Take 120 mg by mouth every 12 (twelve) hours as needed for congestion.   riboflavin  100 MG Tabs tablet Commonly known as: VITAMIN B-2 Take 200 mg by mouth in  the morning and at bedtime.        The results of significant diagnostics from this hospitalization (including imaging, microbiology, ancillary and laboratory) are listed below for reference.    Significant Diagnostic Studies: DG Chest Portable 1 View Result Date: 06/30/2024 EXAM: 1 VIEW(S) XRAY OF THE CHEST 06/30/2024 09:18:00 PM COMPARISON: 05/12/2024 CLINICAL HISTORY: Squamous cell carcinoma. FINDINGS: LUNGS AND PLEURA: Low lung volumes. Mild pulmonary edema, similar to prior study. No focal pulmonary opacity. No pleural effusion. No pneumothorax. HEART AND MEDIASTINUM: Mild cardiomegaly. No acute abnormality of the mediastinal silhouette. BONES AND SOFT TISSUES: Thoracic fusion hardware noted. No acute osseous abnormality. IMPRESSION: 1. Mild pulmonary edema, similar to prior study. 2. Mild cardiomegaly. Electronically signed by: Elsie Gravely MD 06/30/2024 09:27 PM EST RP Workstation: [id number]    Microbiology: No results found for this or any previous visit (from the past 240 hours).   Labs: Basic Metabolic Panel: Recent Labs  Lab  06/30/24 1831 07/01/24 0524 07/03/24 1429  NA 141 141 140  K 4.5 4.1 3.9  CL 107 108 105  CO2 23 23 27   GLUCOSE 111* 96 119*  BUN 20 20 12   CREATININE 2.36* 1.65* 1.12  CALCIUM  8.9 9.0 9.1   Liver Function Tests: Recent Labs  Lab 07/01/24 0524  AST 165*  ALT 42  ALKPHOS 81  BILITOT 1.5*  PROT 7.5  ALBUMIN 3.9   No results for input(s): LIPASE, AMYLASE in the last 168 hours. No results for input(s): AMMONIA in the last 168 hours. CBC: Recent Labs  Lab 06/30/24 1831 07/01/24 0524 07/01/24 1605 07/03/24 0450 07/04/24 0445  WBC 17.8* 16.3*  --  13.6* 14.8*  NEUTROABS 12.6*  --   --   --   --   HGB 6.3* 6.2* 7.7* 7.5* 8.2*  HCT 17.5* 16.8* 22.1* 20.9* 22.6*  MCV 110.8* 110.5*  --  104.0* 103.7*  PLT 120* 140*  --  259 325   Cardiac Enzymes: No results for input(s): CKTOTAL, CKMB, CKMBINDEX, TROPONINI in the last 168 hours. BNP: Invalid input(s): POCBNP CBG: No results for input(s): GLUCAP in the last 168 hours.  Time coordinating discharge: 50 minutes  Signed:  [name] Cover NP  [facility] 07/04/2024, 12:50 PM

## 2024-07-04 NOTE — Discharge Instructions (Signed)
 SABRA

## 2024-07-05 ENCOUNTER — Telehealth: Payer: Self-pay | Admitting: *Deleted

## 2024-07-05 NOTE — Progress Notes (Signed)
 Complex Care Management Note Care Guide Note  07/05/2024 Name: SUSIE POUSSON MRN: [id number] DOB: 01-10-1991   Complex Care Management Outreach Attempts: An unsuccessful telephone outreach was attempted today to offer the patient information about available complex care management services.  Follow Up Plan:  Additional outreach attempts will be made to offer the patient complex care management information and services.   Encounter Outcome:  No Answer  Harlene Satterfield  [facility]  [facility], [facility] Guide  Direct Dial: [phone]  Fax [phone]

## 2024-07-09 NOTE — Progress Notes (Unsigned)
 Complex Care Management Note Care Guide Note  07/09/2024 Name: Manuel Mcguire MRN: [id number] DOB: Jan 28, 1991   Complex Care Management Outreach Attempts: A second unsuccessful outreach was attempted today to offer the patient with information about available complex care management services.  Follow Up Plan:  Additional outreach attempts will be made to offer the patient complex care management information and services.   Encounter Outcome:  No Answer  Harlene Satterfield  [facility]  [facility], [facility] Guide  Direct Dial: [phone]  Fax [phone]

## 2024-07-10 NOTE — Progress Notes (Signed)
 Complex Care Management Note Care Guide Note  07/10/2024 Name: ALPHONS BURGERT MRN: [id number] DOB: 1991-05-12   Complex Care Management Outreach Attempts: A third unsuccessful outreach was attempted today to offer the patient with information about available complex care management services.  Follow Up Plan:  No further outreach attempts will be made at this time. We have been unable to contact the patient to offer or enroll patient in complex care management services.  Encounter Outcome:  No Answer  Harlene Satterfield  [facility]  [facility], [facility] Guide  Direct Dial: [phone]  Fax [phone]
